# Patient Record
Sex: Female | Born: 1940 | Race: Black or African American | Hispanic: No | Smoking: Never smoker
Health system: Southern US, Community
[De-identification: ages and names within clinical notes are randomized; demographics above are authoritative.]

## PROBLEM LIST (undated history)

## (undated) DIAGNOSIS — I10 Essential (primary) hypertension: Secondary | ICD-10-CM

## (undated) DIAGNOSIS — K219 Gastro-esophageal reflux disease without esophagitis: Secondary | ICD-10-CM

## (undated) DIAGNOSIS — Z87442 Personal history of urinary calculi: Secondary | ICD-10-CM

## (undated) DIAGNOSIS — Z95810 Presence of automatic (implantable) cardiac defibrillator: Secondary | ICD-10-CM

## (undated) DIAGNOSIS — I428 Other cardiomyopathies: Secondary | ICD-10-CM

## (undated) DIAGNOSIS — E559 Vitamin D deficiency, unspecified: Secondary | ICD-10-CM

## (undated) DIAGNOSIS — N318 Other neuromuscular dysfunction of bladder: Secondary | ICD-10-CM

## (undated) DIAGNOSIS — J309 Allergic rhinitis, unspecified: Secondary | ICD-10-CM

## (undated) DIAGNOSIS — N959 Unspecified menopausal and perimenopausal disorder: Secondary | ICD-10-CM

## (undated) DIAGNOSIS — Z87448 Personal history of other diseases of urinary system: Secondary | ICD-10-CM

## (undated) DIAGNOSIS — D509 Iron deficiency anemia, unspecified: Secondary | ICD-10-CM

## (undated) DIAGNOSIS — M5137 Other intervertebral disc degeneration, lumbosacral region: Secondary | ICD-10-CM

## (undated) DIAGNOSIS — E785 Hyperlipidemia, unspecified: Secondary | ICD-10-CM

## (undated) DIAGNOSIS — D649 Anemia, unspecified: Secondary | ICD-10-CM

## (undated) DIAGNOSIS — F411 Generalized anxiety disorder: Secondary | ICD-10-CM

## (undated) DIAGNOSIS — I5022 Chronic systolic (congestive) heart failure: Secondary | ICD-10-CM

## (undated) DIAGNOSIS — Z872 Personal history of diseases of the skin and subcutaneous tissue: Secondary | ICD-10-CM

## (undated) DIAGNOSIS — M1710 Unilateral primary osteoarthritis, unspecified knee: Secondary | ICD-10-CM

## (undated) HISTORY — DX: Chronic systolic (congestive) heart failure: I50.22

## (undated) HISTORY — DX: Generalized anxiety disorder: F41.1

## (undated) HISTORY — DX: Essential (primary) hypertension: I10

## (undated) HISTORY — PX: LAMINECTOMY: SHX219

## (undated) HISTORY — DX: Other neuromuscular dysfunction of bladder: N31.8

## (undated) HISTORY — DX: Unspecified menopausal and perimenopausal disorder: N95.9

## (undated) HISTORY — DX: Anemia, unspecified: D64.9

## (undated) HISTORY — DX: Personal history of other diseases of urinary system: Z87.448

## (undated) HISTORY — DX: Other cardiomyopathies: I42.8

## (undated) HISTORY — DX: Vitamin D deficiency, unspecified: E55.9

## (undated) HISTORY — DX: Unilateral primary osteoarthritis, unspecified knee: M17.10

## (undated) HISTORY — DX: Other intervertebral disc degeneration, lumbosacral region: M51.37

## (undated) HISTORY — PX: ABDOMINAL HYSTERECTOMY: SHX81

## (undated) HISTORY — DX: Iron deficiency anemia, unspecified: D50.9

## (undated) HISTORY — DX: Gastro-esophageal reflux disease without esophagitis: K21.9

## (undated) HISTORY — DX: Personal history of diseases of the skin and subcutaneous tissue: Z87.2

## (undated) HISTORY — DX: Hyperlipidemia, unspecified: E78.5

## (undated) HISTORY — DX: Allergic rhinitis, unspecified: J30.9

---

## 2001-02-09 ENCOUNTER — Encounter: Payer: Self-pay | Admitting: Anesthesiology

## 2001-02-13 ENCOUNTER — Ambulatory Visit (HOSPITAL_COMMUNITY): Admission: RE | Admit: 2001-02-13 | Discharge: 2001-02-13 | Payer: Self-pay | Admitting: Urology

## 2002-06-24 ENCOUNTER — Encounter: Admission: RE | Admit: 2002-06-24 | Discharge: 2002-06-24 | Payer: Self-pay | Admitting: Internal Medicine

## 2002-06-24 ENCOUNTER — Encounter: Payer: Self-pay | Admitting: Internal Medicine

## 2003-02-13 ENCOUNTER — Ambulatory Visit (HOSPITAL_COMMUNITY): Admission: RE | Admit: 2003-02-13 | Discharge: 2003-02-13 | Payer: Self-pay | Admitting: Cardiology

## 2003-02-13 ENCOUNTER — Encounter: Payer: Self-pay | Admitting: Cardiology

## 2003-08-25 ENCOUNTER — Ambulatory Visit: Admission: RE | Admit: 2003-08-25 | Discharge: 2003-08-25 | Payer: Self-pay | Admitting: Pulmonary Disease

## 2003-08-28 ENCOUNTER — Encounter (HOSPITAL_COMMUNITY): Admission: RE | Admit: 2003-08-28 | Discharge: 2003-11-26 | Payer: Self-pay | Admitting: Cardiology

## 2003-11-19 ENCOUNTER — Ambulatory Visit (HOSPITAL_COMMUNITY): Admission: RE | Admit: 2003-11-19 | Discharge: 2003-11-19 | Payer: Self-pay | Admitting: Cardiology

## 2003-11-19 ENCOUNTER — Encounter: Payer: Self-pay | Admitting: Cardiology

## 2003-11-21 ENCOUNTER — Ambulatory Visit: Admission: RE | Admit: 2003-11-21 | Discharge: 2003-11-21 | Payer: Self-pay | Admitting: Cardiology

## 2003-12-10 ENCOUNTER — Encounter (HOSPITAL_COMMUNITY): Admission: RE | Admit: 2003-12-10 | Discharge: 2004-03-09 | Payer: Self-pay | Admitting: Cardiology

## 2004-05-17 ENCOUNTER — Ambulatory Visit: Payer: Self-pay | Admitting: Internal Medicine

## 2004-08-11 ENCOUNTER — Ambulatory Visit: Payer: Self-pay

## 2004-08-11 ENCOUNTER — Ambulatory Visit: Payer: Self-pay | Admitting: Critical Care Medicine

## 2004-08-19 ENCOUNTER — Ambulatory Visit: Admission: RE | Admit: 2004-08-19 | Discharge: 2004-08-19 | Payer: Self-pay | Admitting: Pulmonary Disease

## 2004-11-09 ENCOUNTER — Ambulatory Visit: Payer: Self-pay | Admitting: Internal Medicine

## 2004-12-01 ENCOUNTER — Ambulatory Visit: Payer: Self-pay

## 2005-02-08 ENCOUNTER — Ambulatory Visit: Payer: Self-pay | Admitting: Internal Medicine

## 2005-05-09 ENCOUNTER — Ambulatory Visit: Payer: Self-pay | Admitting: Internal Medicine

## 2005-05-20 ENCOUNTER — Ambulatory Visit: Payer: Self-pay | Admitting: Internal Medicine

## 2005-08-15 ENCOUNTER — Ambulatory Visit: Payer: Self-pay

## 2005-08-31 ENCOUNTER — Ambulatory Visit: Payer: Self-pay | Admitting: Cardiology

## 2005-09-06 ENCOUNTER — Ambulatory Visit: Admission: RE | Admit: 2005-09-06 | Discharge: 2005-09-06 | Payer: Self-pay | Admitting: Pulmonary Disease

## 2005-09-07 ENCOUNTER — Ambulatory Visit: Payer: Self-pay

## 2005-09-07 ENCOUNTER — Encounter: Payer: Self-pay | Admitting: Cardiology

## 2006-05-02 ENCOUNTER — Ambulatory Visit: Payer: Self-pay | Admitting: Cardiology

## 2006-07-19 ENCOUNTER — Ambulatory Visit: Payer: Self-pay | Admitting: Cardiology

## 2006-07-19 ENCOUNTER — Ambulatory Visit: Payer: Self-pay | Admitting: Internal Medicine

## 2006-07-20 ENCOUNTER — Encounter: Payer: Self-pay | Admitting: Internal Medicine

## 2006-07-20 LAB — CONVERTED CEMR LAB: Glucose, Bld: 96 mg/dL (ref 70–99)

## 2007-05-07 ENCOUNTER — Encounter: Payer: Self-pay | Admitting: *Deleted

## 2007-05-07 DIAGNOSIS — M51379 Other intervertebral disc degeneration, lumbosacral region without mention of lumbar back pain or lower extremity pain: Secondary | ICD-10-CM

## 2007-05-07 DIAGNOSIS — I10 Essential (primary) hypertension: Secondary | ICD-10-CM

## 2007-05-07 DIAGNOSIS — M5137 Other intervertebral disc degeneration, lumbosacral region: Secondary | ICD-10-CM

## 2007-05-07 DIAGNOSIS — Z9079 Acquired absence of other genital organ(s): Secondary | ICD-10-CM | POA: Insufficient documentation

## 2007-05-07 DIAGNOSIS — I471 Supraventricular tachycardia, unspecified: Secondary | ICD-10-CM | POA: Insufficient documentation

## 2007-05-07 DIAGNOSIS — Z9889 Other specified postprocedural states: Secondary | ICD-10-CM

## 2007-05-07 DIAGNOSIS — Z87448 Personal history of other diseases of urinary system: Secondary | ICD-10-CM | POA: Insufficient documentation

## 2007-05-07 DIAGNOSIS — K219 Gastro-esophageal reflux disease without esophagitis: Secondary | ICD-10-CM | POA: Insufficient documentation

## 2007-05-07 HISTORY — DX: Other intervertebral disc degeneration, lumbosacral region: M51.37

## 2007-05-07 HISTORY — DX: Personal history of other diseases of urinary system: Z87.448

## 2007-05-07 HISTORY — DX: Other intervertebral disc degeneration, lumbosacral region without mention of lumbar back pain or lower extremity pain: M51.379

## 2007-05-07 HISTORY — DX: Gastro-esophageal reflux disease without esophagitis: K21.9

## 2007-05-07 HISTORY — DX: Essential (primary) hypertension: I10

## 2007-05-10 ENCOUNTER — Ambulatory Visit: Payer: Self-pay | Admitting: Cardiology

## 2007-05-18 ENCOUNTER — Encounter: Payer: Self-pay | Admitting: Cardiology

## 2007-05-18 ENCOUNTER — Ambulatory Visit: Payer: Self-pay

## 2007-05-18 ENCOUNTER — Encounter: Payer: Self-pay | Admitting: Internal Medicine

## 2007-05-18 LAB — CONVERTED CEMR LAB
ALT: 14 units/L (ref 0–35)
AST: 15 units/L (ref 0–37)
Albumin: 3.6 g/dL (ref 3.5–5.2)
Alkaline Phosphatase: 82 units/L (ref 39–117)
BUN: 9 mg/dL (ref 6–23)
Bilirubin, Direct: 0.1 mg/dL (ref 0.0–0.3)
CO2: 30 meq/L (ref 19–32)
Calcium: 9.1 mg/dL (ref 8.4–10.5)
Chloride: 106 meq/L (ref 96–112)
Cholesterol: 194 mg/dL (ref 0–200)
Creatinine, Ser: 1 mg/dL (ref 0.4–1.2)
GFR calc Af Amer: 72 mL/min
GFR calc non Af Amer: 59 mL/min
Glucose, Bld: 92 mg/dL (ref 70–99)
HDL: 45.5 mg/dL (ref >39.0)
LDL Cholesterol: 124 mg/dL — ABNORMAL HIGH (ref 0–99)
Potassium: 3.3 meq/L — ABNORMAL LOW (ref 3.5–5.1)
Pro B Natriuretic peptide (BNP): 102 pg/mL — ABNORMAL HIGH (ref 0.0–100.0)
Sodium: 142 meq/L (ref 135–145)
Total Bilirubin: 0.4 mg/dL (ref 0.3–1.2)
Total CHOL/HDL Ratio: 4.3
Total Protein: 7 g/dL (ref 6.0–8.3)
Triglycerides: 122 mg/dL (ref 0–149)
VLDL: 24 mg/dL (ref 0–40)

## 2007-05-23 ENCOUNTER — Ambulatory Visit: Payer: Self-pay | Admitting: Internal Medicine

## 2007-05-23 DIAGNOSIS — D509 Iron deficiency anemia, unspecified: Secondary | ICD-10-CM

## 2007-05-23 DIAGNOSIS — E785 Hyperlipidemia, unspecified: Secondary | ICD-10-CM

## 2007-05-23 DIAGNOSIS — M1710 Unilateral primary osteoarthritis, unspecified knee: Secondary | ICD-10-CM

## 2007-05-23 DIAGNOSIS — IMO0002 Reserved for concepts with insufficient information to code with codable children: Secondary | ICD-10-CM

## 2007-05-23 HISTORY — DX: Reserved for concepts with insufficient information to code with codable children: IMO0002

## 2007-05-23 HISTORY — DX: Iron deficiency anemia, unspecified: D50.9

## 2007-05-23 HISTORY — DX: Hyperlipidemia, unspecified: E78.5

## 2007-05-24 ENCOUNTER — Encounter: Payer: Self-pay | Admitting: Internal Medicine

## 2007-05-29 ENCOUNTER — Encounter: Payer: Self-pay | Admitting: Internal Medicine

## 2007-06-04 ENCOUNTER — Telehealth: Payer: Self-pay | Admitting: Internal Medicine

## 2007-06-14 ENCOUNTER — Telehealth: Payer: Self-pay | Admitting: Internal Medicine

## 2007-06-18 ENCOUNTER — Ambulatory Visit: Payer: Self-pay | Admitting: Internal Medicine

## 2007-06-18 LAB — CONVERTED CEMR LAB
BUN: 10 mg/dL (ref 6–23)
Basophils Relative: 0.5 % (ref 0.0–1.0)
CO2: 30 meq/L (ref 19–32)
Calcium: 9.7 mg/dL (ref 8.4–10.5)
Creatinine, Ser: 1 mg/dL (ref 0.4–1.2)
Eosinophils Relative: 1.9 % (ref 0.0–5.0)
Glucose, Bld: 95 mg/dL (ref 70–99)
Hemoglobin: 12.3 g/dL (ref 12.0–15.0)
INR: 0.9 (ref 0.8–1.0)
Monocytes Absolute: 0.7 10*3/uL (ref 0.2–0.7)
Monocytes Relative: 9.5 % (ref 3.0–11.0)
Prothrombin Time: 11.4 s (ref 10.9–13.3)
RDW: 14.6 % (ref 11.5–14.6)
WBC: 7.6 10*3/uL (ref 4.5–10.5)

## 2007-06-25 ENCOUNTER — Observation Stay (HOSPITAL_COMMUNITY): Admission: RE | Admit: 2007-06-25 | Discharge: 2007-06-26 | Payer: Self-pay | Admitting: Internal Medicine

## 2007-06-25 ENCOUNTER — Ambulatory Visit: Payer: Self-pay | Admitting: Internal Medicine

## 2007-07-11 ENCOUNTER — Ambulatory Visit: Payer: Self-pay

## 2007-07-11 LAB — CONVERTED CEMR LAB
BUN: 12 mg/dL (ref 6–23)
CO2: 30 meq/L (ref 19–32)
Calcium: 9.7 mg/dL (ref 8.4–10.5)
Creatinine, Ser: 1 mg/dL (ref 0.4–1.2)

## 2007-07-16 ENCOUNTER — Ambulatory Visit: Payer: Self-pay | Admitting: Pulmonary Disease

## 2007-08-08 ENCOUNTER — Ambulatory Visit: Payer: Self-pay | Admitting: Cardiology

## 2007-08-17 ENCOUNTER — Ambulatory Visit: Payer: Self-pay | Admitting: Cardiology

## 2007-08-17 LAB — CONVERTED CEMR LAB
CO2: 29 meq/L (ref 19–32)
Calcium: 9.7 mg/dL (ref 8.4–10.5)
Chloride: 103 meq/L (ref 96–112)
Creatinine, Ser: 1.1 mg/dL (ref 0.4–1.2)
Glucose, Bld: 97 mg/dL (ref 70–99)

## 2007-09-25 ENCOUNTER — Ambulatory Visit: Payer: Self-pay | Admitting: Internal Medicine

## 2007-11-06 ENCOUNTER — Ambulatory Visit: Payer: Self-pay | Admitting: Internal Medicine

## 2007-11-06 DIAGNOSIS — J309 Allergic rhinitis, unspecified: Secondary | ICD-10-CM

## 2007-11-06 HISTORY — DX: Allergic rhinitis, unspecified: J30.9

## 2007-11-08 ENCOUNTER — Telehealth: Payer: Self-pay | Admitting: Internal Medicine

## 2007-12-05 ENCOUNTER — Ambulatory Visit: Payer: Self-pay | Admitting: Internal Medicine

## 2008-01-09 ENCOUNTER — Ambulatory Visit: Payer: Self-pay

## 2008-01-09 ENCOUNTER — Ambulatory Visit: Payer: Self-pay | Admitting: Cardiology

## 2008-01-09 LAB — CONVERTED CEMR LAB
BUN: 15 mg/dL (ref 6–23)
CO2: 28 meq/L (ref 19–32)
Chloride: 104 meq/L (ref 96–112)
Creatinine, Ser: 1.3 mg/dL — ABNORMAL HIGH (ref 0.4–1.2)
Glucose, Bld: 97 mg/dL (ref 70–99)

## 2008-02-22 ENCOUNTER — Ambulatory Visit: Payer: Self-pay | Admitting: Internal Medicine

## 2008-02-22 DIAGNOSIS — F411 Generalized anxiety disorder: Secondary | ICD-10-CM | POA: Insufficient documentation

## 2008-02-22 DIAGNOSIS — R319 Hematuria, unspecified: Secondary | ICD-10-CM

## 2008-02-22 DIAGNOSIS — R079 Chest pain, unspecified: Secondary | ICD-10-CM

## 2008-02-22 HISTORY — DX: Generalized anxiety disorder: F41.1

## 2008-02-22 LAB — CONVERTED CEMR LAB
Bilirubin Urine: NEGATIVE
Ketones, ur: NEGATIVE mg/dL
Mucus, UA: NEGATIVE
Nitrite: NEGATIVE
Total Protein, Urine: NEGATIVE mg/dL
pH: 8 (ref 5.0–8.0)

## 2008-05-06 ENCOUNTER — Ambulatory Visit: Payer: Self-pay | Admitting: Internal Medicine

## 2008-05-06 DIAGNOSIS — N959 Unspecified menopausal and perimenopausal disorder: Secondary | ICD-10-CM

## 2008-05-06 HISTORY — DX: Unspecified menopausal and perimenopausal disorder: N95.9

## 2008-05-07 ENCOUNTER — Ambulatory Visit: Payer: Self-pay

## 2008-05-09 LAB — CONVERTED CEMR LAB
ALT: 19 units/L (ref 0–35)
Albumin: 3.6 g/dL (ref 3.5–5.2)
BUN: 14 mg/dL (ref 6–23)
Bilirubin Urine: NEGATIVE
CO2: 29 meq/L (ref 19–32)
Calcium: 9 mg/dL (ref 8.4–10.5)
Creatinine, Ser: 1.1 mg/dL (ref 0.4–1.2)
Crystals: NEGATIVE
Eosinophils Absolute: 0.2 10*3/uL (ref 0.0–0.7)
Folate: 11.6 ng/mL
GFR calc non Af Amer: 53 mL/min
HCT: 33.7 % — ABNORMAL LOW (ref 36.0–46.0)
HDL: 46.8 mg/dL (ref >39.0)
Iron: 74 ug/dL (ref 42–145)
Ketones, ur: NEGATIVE mg/dL
MCHC: 32.8 g/dL (ref 30.0–36.0)
MCV: 81 fL (ref 78.0–100.0)
Monocytes Absolute: 0.4 10*3/uL (ref 0.1–1.0)
Platelets: 253 10*3/uL (ref 150–400)
RDW: 13.7 % (ref 11.5–14.6)
TSH: 0.58 microintl units/mL (ref 0.35–5.50)
Total Bilirubin: 0.6 mg/dL (ref 0.3–1.2)
Total Protein, Urine: NEGATIVE mg/dL
Transferrin: 208 mg/dL — ABNORMAL LOW (ref >=212.0)
Triglycerides: 79 mg/dL (ref 0–149)
Urine Glucose: NEGATIVE mg/dL
Vitamin B-12: 316 pg/mL (ref 211–911)

## 2008-05-16 ENCOUNTER — Telehealth (INDEPENDENT_AMBULATORY_CARE_PROVIDER_SITE_OTHER): Payer: Self-pay | Admitting: *Deleted

## 2008-05-28 ENCOUNTER — Telehealth: Payer: Self-pay | Admitting: Internal Medicine

## 2008-06-17 ENCOUNTER — Ambulatory Visit: Payer: Self-pay | Admitting: Internal Medicine

## 2008-06-17 DIAGNOSIS — D649 Anemia, unspecified: Secondary | ICD-10-CM

## 2008-06-17 DIAGNOSIS — L03119 Cellulitis of unspecified part of limb: Secondary | ICD-10-CM

## 2008-06-17 DIAGNOSIS — E559 Vitamin D deficiency, unspecified: Secondary | ICD-10-CM | POA: Insufficient documentation

## 2008-06-17 DIAGNOSIS — L02419 Cutaneous abscess of limb, unspecified: Secondary | ICD-10-CM

## 2008-06-17 HISTORY — DX: Anemia, unspecified: D64.9

## 2008-06-17 HISTORY — DX: Vitamin D deficiency, unspecified: E55.9

## 2008-09-03 ENCOUNTER — Encounter: Payer: Self-pay | Admitting: Internal Medicine

## 2008-09-22 ENCOUNTER — Telehealth (INDEPENDENT_AMBULATORY_CARE_PROVIDER_SITE_OTHER): Payer: Self-pay | Admitting: *Deleted

## 2008-10-07 ENCOUNTER — Telehealth (INDEPENDENT_AMBULATORY_CARE_PROVIDER_SITE_OTHER): Payer: Self-pay | Admitting: *Deleted

## 2008-11-24 ENCOUNTER — Encounter (INDEPENDENT_AMBULATORY_CARE_PROVIDER_SITE_OTHER): Payer: Self-pay | Admitting: *Deleted

## 2008-12-28 DIAGNOSIS — I5022 Chronic systolic (congestive) heart failure: Secondary | ICD-10-CM

## 2008-12-28 HISTORY — DX: Chronic systolic (congestive) heart failure: I50.22

## 2008-12-30 ENCOUNTER — Ambulatory Visit: Payer: Self-pay | Admitting: Internal Medicine

## 2008-12-30 DIAGNOSIS — I428 Other cardiomyopathies: Secondary | ICD-10-CM

## 2008-12-30 HISTORY — DX: Other cardiomyopathies: I42.8

## 2009-02-02 ENCOUNTER — Ambulatory Visit: Payer: Self-pay | Admitting: Internal Medicine

## 2009-02-02 DIAGNOSIS — H9209 Otalgia, unspecified ear: Secondary | ICD-10-CM | POA: Insufficient documentation

## 2009-02-02 DIAGNOSIS — Z872 Personal history of diseases of the skin and subcutaneous tissue: Secondary | ICD-10-CM

## 2009-02-02 HISTORY — DX: Personal history of diseases of the skin and subcutaneous tissue: Z87.2

## 2009-02-02 LAB — CONVERTED CEMR LAB
ALT: 15 units/L (ref 0–35)
BUN: 15 mg/dL (ref 6–23)
Basophils Absolute: 0 10*3/uL (ref 0.0–0.1)
Basophils Relative: 0.3 % (ref 0.0–3.0)
Bilirubin, Direct: 0.1 mg/dL (ref 0.0–0.3)
CO2: 30 meq/L (ref 19–32)
Calcium: 8.9 mg/dL (ref 8.4–10.5)
Chloride: 106 meq/L (ref 96–112)
Cholesterol: 198 mg/dL (ref 0–200)
Creatinine, Ser: 1 mg/dL (ref 0.4–1.2)
Eosinophils Absolute: 0.2 10*3/uL (ref 0.0–0.7)
HDL: 47.5 mg/dL (ref >39.00)
Ketones, ur: NEGATIVE mg/dL
LDL Cholesterol: 132 mg/dL — ABNORMAL HIGH (ref 0–99)
Lymphocytes Relative: 59.4 % — ABNORMAL HIGH (ref 12.0–46.0)
MCHC: 31.5 g/dL (ref 30.0–36.0)
MCV: 82.1 fL (ref 78.0–100.0)
Monocytes Absolute: 0.4 10*3/uL (ref 0.1–1.0)
Neutrophils Relative %: 28.8 % — ABNORMAL LOW (ref 43.0–77.0)
Platelets: 210 10*3/uL (ref 150.0–400.0)
RBC: 4.2 M/uL (ref 3.87–5.11)
RDW: 13.9 % (ref 11.5–14.6)
Total Bilirubin: 0.7 mg/dL (ref 0.3–1.2)
Total CHOL/HDL Ratio: 4
Total Protein, Urine: NEGATIVE mg/dL
Triglycerides: 91 mg/dL (ref 0.0–149.0)
Urine Glucose: NEGATIVE mg/dL
Urobilinogen, UA: 0.2 (ref 0.0–1.0)

## 2009-02-03 LAB — CONVERTED CEMR LAB: Vit D, 25-Hydroxy: 32 ng/mL (ref 30–89)

## 2009-02-08 LAB — HM MAMMOGRAPHY: HM Mammogram: NORMAL

## 2009-04-10 LAB — CONVERTED CEMR LAB: Pap Smear: NORMAL

## 2009-05-19 ENCOUNTER — Encounter (INDEPENDENT_AMBULATORY_CARE_PROVIDER_SITE_OTHER): Payer: Self-pay | Admitting: *Deleted

## 2009-06-12 ENCOUNTER — Encounter (INDEPENDENT_AMBULATORY_CARE_PROVIDER_SITE_OTHER): Payer: Self-pay

## 2009-06-15 ENCOUNTER — Encounter (INDEPENDENT_AMBULATORY_CARE_PROVIDER_SITE_OTHER): Payer: Self-pay | Admitting: *Deleted

## 2009-06-30 ENCOUNTER — Telehealth: Payer: Self-pay | Admitting: Internal Medicine

## 2009-08-27 ENCOUNTER — Telehealth: Payer: Self-pay | Admitting: Internal Medicine

## 2009-09-29 ENCOUNTER — Ambulatory Visit: Payer: Self-pay | Admitting: Internal Medicine

## 2009-09-29 DIAGNOSIS — H1045 Other chronic allergic conjunctivitis: Secondary | ICD-10-CM | POA: Insufficient documentation

## 2009-10-12 ENCOUNTER — Telehealth: Payer: Self-pay | Admitting: Internal Medicine

## 2009-11-24 ENCOUNTER — Ambulatory Visit: Payer: Self-pay | Admitting: Internal Medicine

## 2010-01-12 ENCOUNTER — Telehealth: Payer: Self-pay | Admitting: Internal Medicine

## 2010-01-25 ENCOUNTER — Ambulatory Visit: Payer: Self-pay | Admitting: Internal Medicine

## 2010-01-26 LAB — CONVERTED CEMR LAB
ALT: 10 units/L (ref 0–35)
AST: 13 units/L (ref 0–37)
BUN: 10 mg/dL (ref 6–23)
Basophils Relative: 0.5 % (ref 0.0–3.0)
CO2: 27 meq/L (ref 19–32)
Chloride: 111 meq/L (ref 96–112)
Eosinophils Absolute: 0.2 10*3/uL (ref 0.0–0.7)
Eosinophils Relative: 2.7 % (ref 0.0–5.0)
Glucose, Bld: 88 mg/dL (ref 70–99)
Hemoglobin: 11.1 g/dL — ABNORMAL LOW (ref 12.0–15.0)
Lymphocytes Relative: 44.1 % (ref 12.0–46.0)
Monocytes Relative: 6.9 % (ref 3.0–12.0)
Neutro Abs: 3 10*3/uL (ref 1.4–7.7)
Neutrophils Relative %: 45.8 % (ref 43.0–77.0)
Potassium: 4.9 meq/L (ref 3.5–5.1)
RBC: 4.05 M/uL (ref 3.87–5.11)
Saturation Ratios: 33.3 % (ref 20.0–50.0)
Specific Gravity, Urine: 1.02 (ref 1.000–1.030)
TSH: 0.46 microintl units/mL (ref 0.35–5.50)
Total Bilirubin: 0.5 mg/dL (ref 0.3–1.2)
Total Protein: 6.6 g/dL (ref 6.0–8.3)
Urine Glucose: NEGATIVE mg/dL
Urobilinogen, UA: 0.2 (ref 0.0–1.0)
VLDL: 20.2 mg/dL (ref 0.0–40.0)
Vitamin B-12: 245 pg/mL (ref 211–911)
WBC: 6.6 10*3/uL (ref 4.5–10.5)
pH: 7.5 (ref 5.0–8.0)

## 2010-01-27 ENCOUNTER — Ambulatory Visit: Payer: Self-pay | Admitting: Internal Medicine

## 2010-01-27 DIAGNOSIS — IMO0002 Reserved for concepts with insufficient information to code with codable children: Secondary | ICD-10-CM

## 2010-02-02 ENCOUNTER — Telehealth: Payer: Self-pay | Admitting: Internal Medicine

## 2010-04-19 ENCOUNTER — Telehealth: Payer: Self-pay | Admitting: Internal Medicine

## 2010-04-23 ENCOUNTER — Encounter: Payer: Self-pay | Admitting: Internal Medicine

## 2010-06-22 ENCOUNTER — Ambulatory Visit: Payer: Self-pay | Admitting: Cardiology

## 2010-07-02 ENCOUNTER — Encounter (INDEPENDENT_AMBULATORY_CARE_PROVIDER_SITE_OTHER): Payer: Self-pay | Admitting: *Deleted

## 2010-07-13 ENCOUNTER — Telehealth: Payer: Self-pay | Admitting: Internal Medicine

## 2010-08-01 ENCOUNTER — Encounter: Payer: Self-pay | Admitting: Family Medicine

## 2010-08-03 ENCOUNTER — Ambulatory Visit
Admission: RE | Admit: 2010-08-03 | Discharge: 2010-08-03 | Payer: Self-pay | Source: Home / Self Care | Attending: Internal Medicine | Admitting: Internal Medicine

## 2010-08-03 DIAGNOSIS — N318 Other neuromuscular dysfunction of bladder: Secondary | ICD-10-CM | POA: Insufficient documentation

## 2010-08-03 DIAGNOSIS — H6990 Unspecified Eustachian tube disorder, unspecified ear: Secondary | ICD-10-CM | POA: Insufficient documentation

## 2010-08-03 DIAGNOSIS — J019 Acute sinusitis, unspecified: Secondary | ICD-10-CM | POA: Insufficient documentation

## 2010-08-03 HISTORY — DX: Other neuromuscular dysfunction of bladder: N31.8

## 2010-08-08 LAB — CONVERTED CEMR LAB
Basophils Relative: 0.5 % (ref 0.0–1.0)
Eosinophils Absolute: 0.2 10*3/uL (ref 0.0–0.6)
Eosinophils Relative: 3 % (ref 0.0–5.0)
HCT: 39 % (ref 36.0–46.0)
Ketones, ur: NEGATIVE mg/dL
MCV: 80.4 fL (ref 78.0–100.0)
Nitrite: NEGATIVE
Pap Smear: NORMAL
Platelets: 255 10*3/uL (ref 150–400)
RBC: 4.85 M/uL (ref 3.87–5.11)
RDW: 14.3 % (ref 11.5–14.6)
Specific Gravity, Urine: 1.015 (ref 1.000–1.03)
Total Protein, Urine: NEGATIVE mg/dL
Urine Glucose: NEGATIVE mg/dL
Urobilinogen, UA: 0.2 (ref 0.0–1.0)
WBC: 6.1 10*3/uL (ref 4.5–10.5)
pH: 7.5 (ref 5.0–8.0)

## 2010-08-12 NOTE — Progress Notes (Signed)
Summary: Medication refill  Phone Note Refill Request Message from:  Fax from Pharmacy on April 19, 2010 9:11 AM  Refills Requested: Medication #1:  VICODIN 5-500 MG  TABS 1 by mouth qid as needed pain   Dosage confirmed as above?Dosage Confirmed   Last Refilled: 01/12/2010   [facility] [address] [place], [place], [place] Initial call taken by: Shirlean Mylar Ewing CMA Deborra Medina),  April 19, 2010 9:12 AM  Follow-up for Phone Call        faxed hardcopy to pharmacy Follow-up by: Robin Ewing CMA Deborra Medina),  April 19, 2010 1:42 PM    Prescriptions: VICODIN 5-500 MG  TABS (HYDROCODONE-ACETAMINOPHEN) 1 by mouth qid as needed pain  #120 x 2   Entered and Authorized by:   Biagio Borg MD   Signed by:   Biagio Borg MD on 04/19/2010   Method used:   Print then Give to Patient   [id number]  done hardcopy to LIM side B - dahlia Biagio Borg MD  April 19, 2010 12:52 PM

## 2010-08-12 NOTE — Progress Notes (Signed)
Summary: medication refill  Phone Note Refill Request Message from:  Fax from Pharmacy on July 13, 2010 8:49 AM  Refills Requested: Medication #1:  VICODIN 5-500 MG  TABS 1 by mouth qid as needed pain   Dosage confirmed as above?Dosage Confirmed   Last Refilled: 04/19/2010   Notes: [facility] [place] [phone] Initial call taken by: Robin Ewing CMA ([place]),  July 13, 2010 8:49 AM    New/Updated Medications: VICODIN 5-500 MG  TABS (HYDROCODONE-ACETAMINOPHEN) 1 by mouth qid as needed pain Prescriptions: VICODIN 5-500 MG  TABS (HYDROCODONE-ACETAMINOPHEN) 1 by mouth qid as needed pain  #120 x 2   Entered and Authorized by:   Biagio Borg MD   Signed by:   Biagio Borg MD on 07/13/2010   Method used:   Print then Give to Patient   RxID:   [id number]  done hardcopy to LIM side B - dahlia Biagio Borg MD  July 13, 2010 8:54 AM   Rx faxed to pharmacy Crissie Sickles, [place]  July 13, 2010 9:04 AM

## 2010-08-12 NOTE — Letter (Signed)
Summary: Referral - not able to see patient  [facility]  [place], [place] [address]   Phone: [phone]  Fax: [phone]    April 23, 2010    Biagio Borg, M.D. [address], [place] [address]    Re:   [name] [name] DOB:  1941-01-06 MRN:   [id number]    Dear Dr. Jenny Reichmann:  Thank you for your kind referral of the above patient.  We have attempted to schedule the recommended procedure Screening Colonoscopy but have not been able to schedule because:  ___ The patient was not available by phone and/or has not returned our calls.   X  The patient declined to schedule the procedure at this time.  We appreciate the referral and hope that we will have the opportunity to treat this patient in the future.    Sincerely,    [facility] [phone]

## 2010-08-12 NOTE — Letter (Signed)
Summary: Device-Delinquent Check  [place], [place] [address] [place]   [place], [place] [address]   Phone: [phone]  Fax: [phone]     July 02, 2010 MRN: [id number]   [facility] [address] [place], [place]  [address]   Dear [name] [name],  According to our records, you have not had your implanted device checked in the recommended period of time.  We are unable to determine appropriate device function without checking your device on a regular basis.  Please call our office to schedule an appointment, with Dr Caryl Comes, as soon as possible.  If you are having your device checked by another physician, please call us so that we may update our records.  Thank you,  Gasper Sells, EMT  July 02, 2010 12:52 PM  [facility]

## 2010-08-12 NOTE — Progress Notes (Signed)
Summary: Med Refill  Phone Note Refill Request  on October 12, 2009 8:35 AM  Refills Requested: Medication #1:  VICODIN 5-500 MG  TABS 1 by mouth qid as needed pain   Dosage confirmed as above?Dosage Confirmed   Last Refilled: 06/2009   Notes[facility] [address] [place] Initial call taken by: Sharon Seller,  October 12, 2009 8:35 AM  Follow-up for Phone Call        rx faxed to pharmacy Follow-up by: Crissie Sickles, [place],  October 12, 2009 10:04 AM    New/Updated Medications: VICODIN 5-500 MG  TABS (HYDROCODONE-ACETAMINOPHEN) 1 by mouth qid as needed pain Prescriptions: VICODIN 5-500 MG  TABS (HYDROCODONE-ACETAMINOPHEN) 1 by mouth qid as needed pain  #120 x 2   Entered and Authorized by:   Biagio Borg MD   Signed by:   Biagio Borg MD on 10/12/2009   Method used:   Print then Give to Patient   RxID:   [id number]  done hardcopy to LIM side B - dahlia  Biagio Borg MD  October 12, 2009 9:03 AM

## 2010-08-12 NOTE — Miscellaneous (Signed)
Summary: [facility]   Imported By: Bubba Hales 10/09/2009 08:55:25  _____________________________________________________________________  External Attachment:    Type:   Image     Comment:   External Document

## 2010-08-12 NOTE — Assessment & Plan Note (Signed)
Summary: allergies/eyes running water/irriated left eye/lb   Vital Signs:  Patient profile:   70 year old female Height:      67 inches Weight:      200.13 pounds BMI:     31.46 O2 Sat:      98 % on Room air Temp:     97 degrees F oral Pulse rate:   72 / minute BP sitting:   120 / 68  (left arm) Cuff size:   regular  Vitals Entered ByShirlean Mylar Ewing (September 29, 2009 10:10 AM)  O2 Flow:  Room air  CC: eye red, left ear pain, allergies/RE   CC:  eye red, left ear pain, and allergies/RE.  History of Present Illness: nexium too expensive and has not been able to take iwth freq heartburn and sour brash, no dyshphgia, n/v, wt loss, abd pian , change in bowel habits or blood.     also here with marked, severe allergy symptoms, started after working in the yard, had 2 dogwood tress in the forn  yard;  symptoms severe to the eyes with itch and matting and swelling and burning but no pain per se and no fever;  saw optometrist who gave her some kind of eye drops that she is not sure worked;  also with nasall allergy type symtpoms with itching, sneezing and d/c with clearish yellow d/c.  No fever, St, cough  Pt denies CP, sob, doe, wheezing, orthopnea, pnd, worsening LE edema, palps, dizziness or syncope  Pt denies new neuro symptoms such as headache, facial or extremity weakness   also mentions left ear seems to recur again with the pain and fullness, feels like "it is leaking and running" but no d/c and nothing on the qtip ;  no hearing loss, or  headache but has had some dizziness that attributes to flu like symtpoms last wk that have o/w resolved.    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) 8)   Vitamin D Deficiency  (ICD-268.9) 9)  Cellulitis, Leg, Left  (ICD-682.6) 10)  Anemia-nos  (ICD-285.9) 11)  Preventive Health Care  (ICD-V70.0) 12)  Menopausal Disorder  (ICD-627.9) 13)  Anxiety  (ICD-300.00) 14)  Chest Pain  (ICD-786.50) 15)  Hematuria Unspecified  (ICD-599.70) 16)  Allergic Rhinitis  (ICD-477.9) 17)  Osteoarthritis, Knees, Bilateral  (ICD-715.96) 18)  Preventive Health Care  (ICD-V70.0) 19)  Family History of Cad Female 1st Degree Relative <60  (ICD-V16.49) 20)  Hyperlipidemia  (ICD-272.4) 21)  Anemia-iron Deficiency  (ICD-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 Take One Tablet Twice Daily 2)  Furosemide 40 Mg  Tabs (Furosemide) .... 1/2 - 1 By Mouth Once Daily 3)  Vicodin 5-500 Mg  Tabs (Hydrocodone-Acetaminophen) .[name].. 1 By Mouth Qid As Needed Pain 4)  Nexium 40 Mg  Cpdr (Esomeprazole Magnesium) .[name].. 1 By Mouth Once Daily 5)  Benazepril Hcl 40 Mg  Tabs (Benazepril Hcl) .[name].. 1 By Mouth Once Daily 6)  Diclofenac Sodium 75 Mg  Tbec (Diclofenac Sodium) .[name].. 1 By Mouth Two Times A Day Prn 7)  Spironolactone 25 Mg  Tabs (Spironolactone) .... 1/2 By Mouth Qd 8)  Advair Diskus 250-50 Mcg/dose  Misc (Fluticasone-Salmeterol) .[name].. 1 Puff Two Times A Day 9)  Estradiol 1 Mg Tabs (Estradiol) .[name].. 1po Once Daily 10)  Cetirizine Hcl 10 Mg Tabs (Cetirizine Hcl) .[name].. 1 By Mouth Once Daily 11)  Vitamin D3 1000 Unit Caps (Cholecalciferol) .[name].. 1 By Mouth Once Daily 12)  Benadryl 25 Mg Tabs (Diphenhydramine Hcl) .... As Needed 13)  Promethazine Hcl 25 Mg Tabs (Promethazine Hcl) .[name].. 1 By Mouth Q 6 Hrs As Needed Nausea  Current Medications (verified): 1)   Coreg 25 Mg  Tabs (Carvedilol) .... Take One Tablet Twice Daily 2)  Furosemide 40 Mg  Tabs (Furosemide) .... 1/2 - 1 By Mouth Once Daily 3)  Vicodin 5-500 Mg  Tabs (Hydrocodone-Acetaminophen) .[name].. 1 By Mouth Qid As Needed Pain 4)  Omeprazole 20 Mg Cpdr (Omeprazole) .... 2 By Mouth Once Daily 5)  Benazepril Hcl 40 Mg  Tabs (Benazepril Hcl) .[name].. 1 By Mouth Once Daily 6)  Diclofenac Sodium 75 Mg  Tbec (Diclofenac Sodium) .[name].. 1 By Mouth Two Times A Day Prn 7)  Spironolactone 25 Mg  Tabs (Spironolactone) .... 1/2 By Mouth Qd 8)  Advair Diskus 250-50 Mcg/dose  Misc (Fluticasone-Salmeterol) .[name].. 1 Puff Two Times A Day 9)  Estradiol 1 Mg Tabs (Estradiol) .[name].. 1po Once Daily 10)  Cetirizine Hcl 10 Mg Tabs (Cetirizine Hcl) .[name].. 1 By Mouth Once Daily 11)  Vitamin D3 1000 Unit Caps (Cholecalciferol) .[name].. 1 By Mouth Once Daily 12)  Benadryl 25 Mg Tabs (Diphenhydramine Hcl) .... As Needed 13)  Promethazine Hcl 25 Mg Tabs (Promethazine Hcl) .[name].. 1 By Mouth Q 6 Hrs As Needed Nausea 14)  Nasonex 50 Mcg/act Susp (Mometasone Furoate) .... 2 Spray /side Once Daily 15)  Prednisone 10 Mg Tabs (Prednisone) .... 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   Head:  normocephalic and atraumatic.   Eyes:  bilat conjunct with erythema, clearish drainage Ears:  right tm mld erythema only, left tm with mod erythema and mild fluid post with slight bulging Nose:  nasal dischargemucosal pallor and mucosal edema.   Mouth:  pharyngeal erythema and fair dentition.   Neck:  supple and no masses.   Lungs:  normal respiratory effort and normal breath sounds.   Heart:  normal rate and regular rhythm.   Extremities:  no edema, no  erythema    Impression & Recommendations:  Problem # 1:  ALLERGIC RHINITIS (ICD-477.9)  Her updated medication list for this problem includes:    Cetirizine Hcl 10 Mg Tabs (Cetirizine hcl) .[name]... 1 by mouth once daily    Benadryl 25 Mg Tabs (Diphenhydramine hcl) .[name]... As needed    Promethazine Hcl 25 Mg Tabs (Promethazine hcl) .[name]... 1 by mouth q 6 hrs as needed nausea    Nasonex 50 Mcg/act Susp (Mometasone furoate) .[name]... 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 2 by mouth once daily gave nexium samples, but also to change to above  Complete Medication List: 1)  Coreg 25 Mg Tabs (Carvedilol) .... Take one tablet twice daily 2)  Furosemide 40 Mg Tabs (Furosemide) .... 1/2 - 1 by mouth once daily 3)  Vicodin 5-500 Mg Tabs  (Hydrocodone-acetaminophen) .[name].. 1 by mouth qid as needed pain 4)  Omeprazole 20 Mg Cpdr (Omeprazole) .... 2 by mouth once daily 5)  Benazepril Hcl 40 Mg Tabs (Benazepril hcl) .[name].. 1 by mouth once daily 6)  Diclofenac Sodium 75 Mg Tbec (Diclofenac sodium) .[name].. 1 by mouth two times a day prn 7)  Spironolactone 25 Mg Tabs (Spironolactone) .... 1/2 by mouth qd 8)  Advair Diskus 250-50 Mcg/dose Misc (Fluticasone-salmeterol) .[name].. 1 puff two times a day 9)  Estradiol 1 Mg Tabs (Estradiol) .[name].. 1po once daily 10)  Cetirizine Hcl 10 Mg Tabs (Cetirizine hcl) .[name].. 1 by mouth once daily 11)  Vitamin D3 1000 Unit Caps (Cholecalciferol) .[name].. 1 by mouth once daily 12)  Benadryl 25 Mg Tabs (Diphenhydramine hcl) .... As needed 13)  Promethazine Hcl 25 Mg Tabs (Promethazine hcl) .[name].. 1 by mouth q 6 hrs as needed nausea 14)  Nasonex 50 Mcg/act Susp (Mometasone furoate) .... 2 spray /side once daily 15)  Prednisone 10 Mg Tabs (Prednisone) .... 4po qd for 3days, then 3po qd for 3days, then 2po qd for 3days, then 1po qd for 3 days, then stop  Patient Instructions: 1)  Please take all new medications as prescribed  - the prednisone, and the nasonex (please call if the card for the nasonex does not work, since the nasonex can be changed to generic flonase) 2)  Continue all previous medications as before this visit  3)  You can also use Mucinex OTC or it's generic for congestion 4)  stop the nexium when the pills are gone 5)  start the omeprazole 20 mg after that  - 2 per day 6)  Please schedule a follow-up appointment in 4 months with CPX labs Prescriptions: PREDNISONE 10 MG TABS (PREDNISONE) 4po qd for 3days, then 3po qd for 3days, then 2po qd for 3days, then 1po qd for 3 days, then stop  #30 x 0   Entered and Authorized by:   Biagio Borg MD   Signed by:   Biagio Borg MD on 09/29/2009   Method used:   Print then Give to Patient   RxID:   [id number] NASONEX 24  MCG/ACT SUSP (MOMETASONE FUROATE) 2 spray  /side once daily  #1 x 11   Entered and Authorized by:   Biagio Borg MD   Signed by:   Biagio Borg MD on 09/29/2009   Method used:   Print then Give to Patient   RxID:   [id number] OMEPRAZOLE 20 MG CPDR (OMEPRAZOLE) 2 by mouth once daily  #60 x 11   Entered and Authorized by:   Biagio Borg MD   Signed by:   Biagio Borg MD on 09/29/2009   Method used:   Print then Give to Patient   RxID:   [id number]

## 2010-08-12 NOTE — Progress Notes (Signed)
Summary: sick call  Phone Note Call from Patient   Summary of Call: Patient left message on triage c/o fever, body aches, and cough. I rtn patient call and per the patient all she has taken is Benadryl. I advised patient that she could try OTC cold medications, mucinex, and robitussin or delsym. Patient asked about prescription for nausea and I made her aware that MD usually does not give that prescription without office visit. I made pt aware that if she feels like she needs to see a MD now, she should go to UC or ER. Patient stated that she would call back tomorrow if she doesn't feel any better. Any recommendations? Initial call taken by: Ernestene Mention,  August 27, 2009 4:37 PM  Follow-up for Phone Call        ok for phenergan as needed - done escript Follow-up by: Biagio Borg MD,  August 27, 2009 5:15 PM  Additional Follow-up for Phone Call Additional follow up Details #1::        Patient notified. Additional Follow-up by: Ernestene Mention,  August 28, 2009 10:01 AM    New/Updated Medications: PROMETHAZINE HCL 25 MG TABS (PROMETHAZINE HCL) 1 by mouth q 6 hrs as needed nausea Prescriptions: PROMETHAZINE HCL 25 MG TABS (PROMETHAZINE HCL) 1 by mouth q 6 hrs as needed nausea  #40 x 1   Entered and Authorized by:   Biagio Borg MD   Signed by:   Biagio Borg MD on 08/27/2009   Method used:   Electronically to        [facility] (retail)       [address], [place]       [place],   [address]       Ph: [phone]       Fax: [phone]   RxID:   [id number]

## 2010-08-12 NOTE — Progress Notes (Signed)
Summary: medication Refill  Phone Note Refill Request  on January 12, 2010 8:58 AM  Refills Requested: Medication #1:  VICODIN 5-500 MG  TABS 1 by mouth qid as needed pain   Dosage confirmed as above?Dosage Confirmed   Last Refilled: 10/12/2009   Notes: [facility] Initial call taken by: Robin Ewing CMA ([place]),  January 12, 2010 8:59 AM    Prescriptions: VICODIN 5-500 MG  TABS (HYDROCODONE-ACETAMINOPHEN) 1 by mouth qid as needed pain  #120 x 2   Entered and Authorized by:   Biagio Borg MD   Signed by:   Biagio Borg MD on 01/12/2010   Method used:   Print then Give to Patient   RxID:   [id number]  done hardcopy to LIM side B - dahlia  Biagio Borg MD  January 12, 2010 1:02 PM   Rx faxed to [facility], [place]  January 12, 2010 1:17 PM

## 2010-08-12 NOTE — Progress Notes (Signed)
Summary: Nasonex rx  Phone Note From Pharmacy   Caller: [place]. Summary of Call: [place]. [phone]) wants to see if the Nasonex rx given last week can be changed to Flonase as the pt's insurance doesn't cover Nasonex. Initial call taken by: Rebeca Alert MA,  February 02, 2010 8:08 AM  Follow-up for Phone Call        ok to  change - to robin to handle Follow-up by: Biagio Borg MD,  February 02, 2010 9:34 AM    New/Updated Medications: FLONASE 50 MCG/ACT SUSP (FLUTICASONE PROPIONATE) 2 spray/side once daily Prescriptions: FLONASE 50 MCG/ACT SUSP (FLUTICASONE PROPIONATE) 2 spray/side once daily  #3 x 3   Entered by:   Sharon Seller CMA ([place])   Authorized by:   Biagio Borg MD   Signed by:   Sharon Seller CMA ([place]) on 02/02/2010   Method used:   Electronically to        [id number] (retail)       [address] [place], [place]       [place],   [address]       Ph: [phone]       Fax: [phone]   RxID:   [id number]

## 2010-08-12 NOTE — Assessment & Plan Note (Signed)
Summary: 4 MO ROV /NWS  #   Vital Signs:  Patient profile:   70 year old female Height:      67 inches Weight:      201.25 pounds BMI:     31.63 O2 Sat:      96 % on Room air Temp:     98.7 degrees F oral Pulse rate:   73 / minute BP sitting:   112 / 62  (left arm) Cuff size:   large  Vitals Entered By: Shirlean Mylar Ewing CMA Deborra Medina) (January 27, 2010 8:22 AM)  O2 Flow:  Room air  Preventive Care Screening  Mammogram:    Date:  02/08/2009    Next Due:  02/2010    Results:  normal   Pap Smear:    Date:  04/10/2009    Next Due:  04/2010    Results:  normal   CC: 4 month ROV/RE   CC:  4 month ROV/RE.  History of Present Illness: here to f/u; admits to some dieatry indiscretion and getting zaway from the low chol diet but plans to re-start noe  also with a scar now after a spider bite to the right mid upper arm that is not longer painful  also with a knot to the left axilla that came to a head and drained some 2 days ago, but still red, painfull and with "hard core"   Pt denies CP, sob, doe, wheezing, orthopnea, pnd, worsening LE edema, palps, dizziness or syncope  Pt denies new neuro symptoms such as headache, facial or extremity weakness .    Preventive Screening-Counseling & Management      Drug Use:  no.    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00) 25)  Laminectomy, Hx of  (ICD-V45.89) 26)  Hysterectomy, Hx of  (ICD-V45.77) 27)  Degenerative Disc Disease, Lumbar Spine  (ICD-722.52) 28)  Hx of Gerd  (ICD-530.81) 29)  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 Take One Tablet Twice Daily 2)  Furosemide 40 Mg  Tabs (Furosemide) .... 1/2 - 1 By Mouth Once Daily 3)  Vicodin 5-500 Mg  Tabs (Hydrocodone-Acetaminophen) .[name].. 1 By Mouth Qid As Needed Pain 4)  Omeprazole 20 Mg Cpdr (Omeprazole) .... 2 By Mouth Once Daily 5)  Benazepril Hcl 40 Mg  Tabs (Benazepril Hcl) .[name].. 1 By Mouth Once Daily 6)  Diclofenac Sodium 75 Mg  Tbec (Diclofenac Sodium) .[name].. 1 By Mouth Two  Times A Day Prn 7)  Spironolactone 25 Mg  Tabs (Spironolactone) .... 1/2 By Mouth Qd 8)  Advair Diskus 250-50 Mcg/dose  Misc (Fluticasone-Salmeterol) .[name].. 1 Puff Two Times A Day 9)  Estradiol 1 Mg Tabs (Estradiol) .[name].. 1po Once Daily 10)  Cetirizine Hcl 10 Mg Tabs (Cetirizine Hcl) .[name].. 1 By Mouth Once Daily 11)  Vitamin D3 1000 Unit Caps (Cholecalciferol) .[name].. 1 By Mouth Once Daily 12)  Benadryl 25 Mg Tabs (Diphenhydramine Hcl) .... As Needed 13)  Promethazine Hcl 25 Mg Tabs (Promethazine Hcl) .[name].. 1 By Mouth Q 6 Hrs As Needed Nausea 14)  Nasonex 50 Mcg/act Susp (Mometasone Furoate) .... 2 Spray /side Once Daily  Current Medications (verified): 1)  Coreg 25 Mg  Tabs (Carvedilol) .... Take One Tablet Twice Daily 2)  Furosemide 40 Mg  Tabs (Furosemide) .... 1/2 - 1 By Mouth Once Daily 3)  Vicodin 5-500 Mg  Tabs  (Hydrocodone-Acetaminophen) .[name].. 1 By Mouth Qid As Needed Pain 4)  Omeprazole 20 Mg Cpdr (Omeprazole) .... 2 By Mouth Once Daily 5)  Benazepril Hcl 40 Mg  Tabs (Benazepril Hcl) .[name].. 1 By Mouth Once Daily 6)  Diclofenac Sodium 75 Mg  Tbec (Diclofenac Sodium) .[name].. 1 By Mouth Two Times A Day Prn 7)  Spironolactone 25 Mg  Tabs (Spironolactone) .... 1/2 By Mouth Qd 8)  Advair Diskus 250-50 Mcg/dose  Misc (Fluticasone-Salmeterol) .[name].. 1 Puff Two Times A Day 9)  Estradiol 1 Mg Tabs (Estradiol) .[name].. 1po Once Daily 10)  Cetirizine Hcl 10 Mg Tabs (Cetirizine Hcl) .[name].. 1 By Mouth Once Daily 11)  Vitamin D3 1000 Unit Caps (Cholecalciferol) .[name].. 1 By Mouth Once Daily 12)  Benadryl 25 Mg Tabs (Diphenhydramine Hcl) .... As Needed 13)  Promethazine Hcl 25 Mg Tabs (Promethazine Hcl) .[name].. 1 By Mouth Q 6 Hrs As Needed Nausea 14)  Nasonex 50 Mcg/act Susp (Mometasone Furoate) .... 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 HYPERTENSION (ICD-401.9) Anemia-iron deficiency Hyperlipidemia Allergic rhinitis Congestive heart failure Anxiety GERD low vit d Anemia-NOS  Past Surgical History: Last updated: 05/07/2007 LAMINECTOMY, HX OF (ICD-V45.89) HYSTERECTOMY, HX OF (ICD-V45.77)  Family History: Last updated: 05/23/2007 Family History Kidney disease Family History of Arthritis Family History of CAD Female 1st degree relative Family History of Stroke F 1st degree relative - mother  Social History: Last updated: 01/27/2010 Never Smoked Alcohol use-no 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         all otherwise negative per pt -    Physical Exam  General:  alert and overweight-appearing.   Head:  normocephalic  and atraumatic.   Eyes:  vision grossly intact, pupils equal, and pupils round.   Ears:  R ear normal and L ear normal.   Nose:  no external deformity and no nasal discharge.   Mouth:  no gingival abnormalities and pharynx pink and moist.   Neck:  supple and no masses.   Lungs:  normal respiratory effort and normal breath sounds.   Heart:  normal rate and regular rhythm.   Abdomen:  soft, non-tender, and normal bowel sounds.   Msk:  no joint tenderness and no joint swelling.   Extremities:  no edema, no erythema  Neurologic:  cranial nerves II-XII intact and strength normal in all extremities.   Skin:  just below the left axilla with 1/2 cm small abscess without drainage Psych:  not anxious appearing and not depressed appearing.     Impression & Recommendations:  Problem # 1:  Preventive Health Care (ICD-V70.0)  Overall doing well, age appropriate education and counseling updated and referral for appropriate preventive services done unless declined, immunizations up to date or declined, diet counseling done if overweight, urged to  quit smoking if smokes , most recent labs reviewed and current ordered if appropriate, ecg reviewed or declined (interpretation per ECG scanned in the EMR if done); information regarding Medicare Prevention requirements given if appropriate; speciality referrals updated as appropriate ; due for f/u colonoscopy  Orders: Gastroenterology Referral (GI)  Problem # 2:  HYPERTENSION (ICD-401.9)  Her updated medication list for this problem includes:    Coreg 25 Mg Tabs (Carvedilol) .[name]... Take one tablet twice daily    Furosemide 40 Mg Tabs (Furosemide) .[name]... 1/2 - 1 by mouth once daily    Benazepril Hcl 40 Mg Tabs (Benazepril hcl) .[name]... 1 by mouth once daily    Spironolactone 25 Mg Tabs (Spironolactone) .[name]... 1/2 by mouth once daily  BP today: 112/62 Prior BP: 144/84 (11/24/2009)  Labs Reviewed: K+: 4.9 (01/25/2010) Creat: : 1.0 (01/25/2010)   Chol: 204 (01/25/2010)   HDL: 54.10 (01/25/2010)   LDL: 132 (02/02/2009)   TG: 101.0 (01/25/2010) stable overall by hx and exam, ok to continue meds/tx as is   Problem # 3:  ABSCESS, AXILLA, LEFT (ICD-682.3)  treat as above, f/u any worsening signs or symptoms   Her updated medication list for this problem includes:    Doxycycline Hyclate 100 Mg Caps (Doxycycline hyclate) .[name]... 1po two times a day  Complete Medication List: 1)  Coreg 25 Mg Tabs (Carvedilol) .... Take one tablet twice daily 2)  Furosemide 40 Mg Tabs (Furosemide) .... 1/2 - 1 by mouth once daily 3)  Vicodin 5-500 Mg Tabs (Hydrocodone-acetaminophen) .[name].. 1 by mouth qid as needed pain 4)  Nexium 40 Mg Cpdr (Esomeprazole magnesium) .[name].. 1 by mouth once daily 5)  Benazepril Hcl 40 Mg Tabs (Benazepril hcl) .[name].. 1 by mouth once daily 6)  Diclofenac Sodium 75 Mg Tbec (Diclofenac sodium) .[name].. 1 by mouth two times a day prn 7)  Spironolactone 25 Mg Tabs (Spironolactone) .... 1/2 by mouth once daily 8)  Advair Diskus 250-50 Mcg/dose Misc (Fluticasone-salmeterol) .[name].. 1 puff two times a  day 9)  Estradiol 1 Mg Tabs (Estradiol) .[name].. 1po once daily 10)  Cetirizine Hcl 10 Mg Tabs (Cetirizine hcl) .[name].. 1 by mouth once daily 11)  Vitamin D3 1000 Unit Caps (Cholecalciferol) .[name].. 1 by mouth once daily 12)  Benadryl 25 Mg Tabs (Diphenhydramine hcl) .... As needed 13)  Nasonex 50 Mcg/act Susp (Mometasone furoate) .... 2 spray [place]  once daily 14)  Doxycycline Hyclate 100 Mg Caps (Doxycycline hyclate) .[name].. 1po two times a day  Patient Instructions: 1)  You will be contacted about the referral(s) to: colonoscopy 2)  Please take all new medications as prescribed 3)  Continue all previous medications as before this visit  4)  Please schedule a follow-up appointment in 1 year or sooner if needed Prescriptions: NASONEX 50 MCG/ACT SUSP (MOMETASONE FUROATE) 2 spray /side once daily  #3 x 3   Entered and Authorized by:   Biagio Borg MD   Signed by:   Biagio Borg MD on 01/27/2010   Method used:   Print then Give to Patient   RxIDJB:[id number] ESTRADIOL 1 MG TABS (ESTRADIOL) 1po once daily  #90 x 3   Entered and Authorized by:   Biagio Borg MD   Signed by:   Biagio Borg MD on 01/27/2010   Method used:   Print then Give to Patient   RxID:   [id number] CETIRIZINE HCL 10 MG TABS (CETIRIZINE HCL) 1 by mouth once daily  #90 x 3   Entered and Authorized by:   Biagio Borg MD   Signed by:   Biagio Borg MD on 01/27/2010   Method used:   Print then Give to Patient   RxID:   [id number] ADVAIR DISKUS 250-50 MCG/DOSE  MISC (FLUTICASONE-SALMETEROL) 1 puff two times a day  #3 x 3   Entered and Authorized by:   Biagio Borg MD   Signed by:   Biagio Borg MD on 01/27/2010   Method used:   Print then Give to Patient   RxID:   [id number] SPIRONOLACTONE 25 MG  TABS (SPIRONOLACTONE) 1/2 by mouth once daily  #45 x 3   Entered and Authorized by:   Biagio Borg MD   Signed by:   Biagio Borg MD on 01/27/2010   Method used:   Print then Give to Patient   RxID:    [id number] DICLOFENAC SODIUM 75 MG  TBEC (DICLOFENAC SODIUM) 1 by mouth two times a day prn  #180 x 3   Entered and Authorized by:   Biagio Borg MD   Signed by:   Biagio Borg MD on 01/27/2010   Method used:   Print then Give to Patient   RxIDOM:[id number] BENAZEPRIL HCL 40 MG  TABS (BENAZEPRIL HCL) 1 by mouth once daily  #90 x 3   Entered and Authorized by:   Biagio Borg MD   Signed by:   Biagio Borg MD on 01/27/2010   Method used:   Print then Give to Patient   RxID:   [id number] [place] 40 MG CPDR (ESOMEPRAZOLE MAGNESIUM) 1 by mouth once daily  #90 x 3   Entered and Authorized by:   Biagio Borg MD   Signed by:   Biagio Borg MD on 01/27/2010   Method used:   Print then Give to Patient   RxID:   [id number] FUROSEMIDE 40 MG  TABS (FUROSEMIDE) 1/2 - 1 by mouth once daily  #90 x 3   Entered and Authorized by:   Biagio Borg MD   Signed by:   Biagio Borg MD on 01/27/2010   Method used:   Print then Give to Patient   RxID:   [id number] COREG 25 MG  TABS (CARVEDILOL) Take one tablet twice daily  #180 x 3   Entered and Authorized  by:   Biagio Borg MD   Signed by:   Biagio Borg MD on 01/27/2010   Method used:   Print then Give to Patient   RxID:   [id number] DOXYCYCLINE HYCLATE 100 MG CAPS (DOXYCYCLINE HYCLATE) 1po two times a day  #20 x 0   Entered and Authorized by:   Biagio Borg MD   Signed by:   Biagio Borg MD on 01/27/2010   Method used:   Print then Give to Patient   RxID:   [id number]

## 2010-08-12 NOTE — Assessment & Plan Note (Signed)
Summary: ROV/pt wants defib checkat this visit per pt call/lg   Referring Provider:  Kirk Ruths  CC:  ROV/pt wants defib check today.  She states that sometimes she is having pain in her arms.  She states other than that she is doing just fine. Her implant site is still hurting or itching. Pt has questions on whether or not she should be able to bike with her grandkids.  .  History of Present Illness: [name] [name] is seen in followup for an ICD implanted for primary prevention in the setting of nonischemic heart disease. She has chronic systolic heart failure with a relatively narrow QRS and underwent single-chamber defibrillator implantation. She has had no intercurrent problems from the device apart from discomfort at the site which is gradually lessened but has not resolved. she has intermittent atypical chest pains. Denies shortness of breath peripheral edema or lightheadedness  Current Medications (verified): 1)  Coreg 25 Mg  Tabs (Carvedilol) .... Take One Tablet Twice Daily 2)  Furosemide 40 Mg  Tabs (Furosemide) .... 1/2 - 1 By Mouth Once Daily 3)  Vicodin 5-500 Mg  Tabs (Hydrocodone-Acetaminophen) .[name].. 1 By Mouth Qid As Needed Pain 4)  Omeprazole 20 Mg Cpdr (Omeprazole) .... 2 By Mouth Once Daily 5)  Benazepril Hcl 40 Mg  Tabs (Benazepril Hcl) .[name].. 1 By Mouth Once Daily 6)  Diclofenac Sodium 75 Mg  Tbec (Diclofenac Sodium) .[name].. 1 By Mouth Two Times A Day Prn 7)  Spironolactone 25 Mg  Tabs (Spironolactone) .... 1/2 By Mouth Qd 8)  Advair Diskus 250-50 Mcg/dose  Misc (Fluticasone-Salmeterol) .[name].. 1 Puff Two Times A Day 9)  Estradiol 1 Mg Tabs (Estradiol) .[name].. 1po Once Daily 10)  Cetirizine Hcl 10 Mg Tabs (Cetirizine Hcl) .[name].. 1 By Mouth Once Daily 11)  Vitamin D3 1000 Unit Caps (Cholecalciferol) .[name].. 1 By Mouth Once Daily 12)  Benadryl 25 Mg Tabs (Diphenhydramine Hcl) .... As Needed 13)  Promethazine Hcl 25 Mg Tabs (Promethazine Hcl) .[name].. 1 By Mouth Q 6 Hrs As Needed Nausea 14)   Nasonex 50 Mcg/act Susp (Mometasone Furoate) .... 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 HYPERTENSION (ICD-401.9) Anemia-iron deficiency Hyperlipidemia Allergic rhinitis Congestive heart failure Anxiety GERD low vit d Anemia-NOS  Past Surgical History: Last updated: 05/07/2007 LAMINECTOMY, HX OF (ICD-V45.89) HYSTERECTOMY, HX OF (ICD-V45.77)  Family History: Last updated: 05/23/2007 Family History Kidney disease Family History of Arthritis Family History of CAD Female 1st degree relative Family History of Stroke F 1st degree relative - mother  Social History: Last updated: 06/17/2008 Never Smoked Alcohol use-no 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 HEENT Normal.  Neck veins were flat, carotids were brisk.  Lungs were clear.  Heart sounds were  regular without murmurs or gallops.  Abdomen was soft with active bowel sounds. There is no clubbing cyanosis or edema. Skin Warm and dry; trunk and keloid at the incision site    EKG  Procedure date:  11/24/2009  Findings:      sinus rhythm at 61 Intervals 0.14/0.09/0.43 Left ventricular hypertrophy with repolarization abnormalities Axis is 6   ICD Specifications Following MD:   [name], MD     ICD Vendor:  [employer]     ICD Model Number:  [id number]     ICD Serial Number:  [id number] ICD DOI:  06/25/2007     ICD Implanting MD:  [name], MD  Lead 1:    Location: RV     DOI: 06/25/2007     Model #: [id number]     Serial #: [id number]     Status: active  Indications::  NICM   ICD Follow Up Remote Check?  No Battery Voltage:  3.17 V     Charge Time:  7.28 seconds     Underlying rhythm:  SR ICD Dependent:  No       ICD Device Measurements Right Ventricle:  Amplitude: 14.8 mV, Impedance: 440 ohms, Threshold: 1.0 V at 0.4 msec Shock Impedance: 34/42 ohms   Episodes Shock:  0     ATP:  0     Nonsustained:  147      Brady Parameters Mode VVI     Lower Rate Limit:  40      Tachy Zones VF:  200     VT:  250     VT1:  130 (MONITOR)     Impression & Recommendations:  Problem # 1:  CARDIOMYOPATHY, PRIMARY, DILATED (ICD-425.4) stable on her current medications Her updated medication list for this problem includes:    Coreg 25 Mg Tabs (Carvedilol) .[name]... Take one tablet twice daily    Furosemide 40 Mg Tabs (Furosemide) .[name]... 1/2 - 1 by mouth once daily    Benazepril Hcl 40 Mg Tabs (Benazepril hcl) .[name]... 1 by mouth once daily    Spironolactone 25 Mg Tabs (Spironolactone) .[name]... 1/2 by mouth qd  Problem # 2:  SYSTOLIC HEART FAILURE, CHRONIC (ICD-428.22) stable on current meds  Problem # 3:  Hx of TACHYCARDIA, PAROXYSMAL ATRIAL (ICD-427.0) patient has tachycardia detected by her device. It appears to be sinus. The device monitoring zone is reprogrammed. Her updated medication list for this problem includes:    Coreg 25 Mg Tabs (Carvedilol) .[name]... Take one tablet twice daily    Benazepril Hcl 40 Mg Tabs (Benazepril hcl) .[name]... 1 by mouth once daily  Problem # 4:  IMPLANTABLE DEFIBRILLATOR MDT (ICD-V45.02) Device parameters and data were reviewed and no changes were made apart from raising the monitoring zone detection rate 130-150 beats per minute  Patient  Instructions: 1)  Your physician recommends that you schedule a follow-up appointment in: three-month device clinic

## 2010-08-20 ENCOUNTER — Encounter (INDEPENDENT_AMBULATORY_CARE_PROVIDER_SITE_OTHER): Payer: MEDICARE

## 2010-08-20 ENCOUNTER — Ambulatory Visit (INDEPENDENT_AMBULATORY_CARE_PROVIDER_SITE_OTHER): Payer: MEDICARE | Admitting: Cardiology

## 2010-08-20 ENCOUNTER — Encounter: Payer: Self-pay | Admitting: Internal Medicine

## 2010-08-20 ENCOUNTER — Encounter: Payer: Self-pay | Admitting: Cardiology

## 2010-08-20 ENCOUNTER — Other Ambulatory Visit: Payer: Self-pay | Admitting: Cardiology

## 2010-08-20 DIAGNOSIS — I10 Essential (primary) hypertension: Secondary | ICD-10-CM

## 2010-08-20 DIAGNOSIS — R0602 Shortness of breath: Secondary | ICD-10-CM

## 2010-08-20 DIAGNOSIS — I428 Other cardiomyopathies: Secondary | ICD-10-CM

## 2010-08-20 LAB — BASIC METABOLIC PANEL
Chloride: 103 mEq/L (ref 96–112)
GFR: 61.63 mL/min (ref >60.00)
Potassium: 4.3 mEq/L (ref 3.5–5.1)
Sodium: 139 mEq/L (ref 135–145)

## 2010-08-20 LAB — BRAIN NATRIURETIC PEPTIDE: Pro B Natriuretic peptide (BNP): 55.2 pg/mL (ref 0.0–100.0)

## 2010-08-26 NOTE — Assessment & Plan Note (Signed)
Summary: wants follow up/wants defib checked/mt rs from bump list-mb r...   Referring Provider:  Kirk Ruths  CC:  dizziness.  History of Present Illness: [name] [name] is a very pleasant  female who has a history of nonischemic cardiomyopathy.  Her ejection fraction in November 2008 was 20-30% with mild mitral regurgitation by echocardiogram.  Most recent Myoview in November 2008 showed an ejection fraction of 34%.  There was mild apical thinning and a mild degree of ischemia cannot be excluded. Low risk.  We have treated her medically.  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 I last saw her in July of 2009. Since then, the patient has dyspnea with more extreme activities but not with routine activities. It is relieved with rest. It is not associated with chest pain. There is no orthopnea, PND or pedal edema. There is no syncope or palpitations. There is no exertional chest pain.   Current Medications (verified): 1)  Coreg 25 Mg  Tabs (Carvedilol) .... Take One Tablet Twice Daily 2)  Furosemide 40 Mg  Tabs (Furosemide) .... 1/2 - 1 By Mouth Once Daily 3)  Vicodin 5-500 Mg  Tabs (Hydrocodone-Acetaminophen) .[name].. 1 By Mouth Qid As Needed Pain 4)  Nexium 40 Mg Cpdr (Esomeprazole Magnesium) .[name].. 1 By Mouth Once Daily 5)  Benazepril Hcl 40 Mg  Tabs (Benazepril Hcl) .[name].. 1 By Mouth Once Daily 6)  Diclofenac Sodium 75 Mg  Tbec (Diclofenac Sodium) .[name].. 1 By Mouth Two Times A Day Prn 7)  Spironolactone 25 Mg  Tabs (Spironolactone) .... 1/2 By Mouth Once Daily 8)  Advair Diskus 250-50 Mcg/dose  Misc (Fluticasone-Salmeterol) .[name].. 1 Puff Two Times A Day 9)  Estradiol 1 Mg Tabs (Estradiol) .[name].. 1po Once Daily 10)  Cetirizine Hcl 10 Mg Tabs (Cetirizine Hcl) .[name].. 1 By Mouth Once Daily 11)  Vitamin D3 1000 Unit Caps (Cholecalciferol) .[name].. 1 By Mouth Once Daily 12)  Benadryl 25 Mg Tabs (Diphenhydramine Hcl) .... As Needed 13)  Nasonex 50 Mcg/act Susp (Mometasone Furoate) .... 2 Spray /side Once  Daily 14)  Flonase 50 Mcg/act Susp (Fluticasone Propionate) .... 2 Spray/side Once Daily 15)  Oxybutynin Chloride 5 Mg Xr24h-Tab (Oxybutynin Chloride) .[name].. 1po Two Times A Day  Allergies: No Known Drug Allergies  Past History:  Past Medical History: Reviewed history from 06/17/2008 and no changes required. 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 Remaining systems are negative.   Vital Signs:  Patient profile:   70 year old female Height:      67 inches Weight:      208 pounds BMI:     32.70 Pulse rate:   72 / minute Resp:     14 per minute BP sitting:   135 / 76  (left arm)  Vitals Entered By: Burnett Kanaris (August 20, 2010 9:21  AM)  Physical Exam  General:  Well-developed well-nourished in no acute distress.  Skin is warm and dry.  HEENT is normal.  Neck is supple. No thyromegaly.  Chest is clear to auscultation with normal expansion.  Cardiovascular exam is regular rate and rhythm.  Abdominal exam nontender or distended. No masses palpated. Extremities show no edema. neuro grossly intact    EKG  Procedure date:   08/20/2010  Findings:      Sinus rhythm with nonspecific ST changes.   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 Repeat echocardiogram. Check potassium, renal function and BNP. Her updated medication list for this problem includes:    Coreg 25 Mg Tabs (Carvedilol) .[name]... Take one tablet twice daily    Furosemide 40 Mg Tabs (Furosemide) .[name]... 1/2 - 1 by mouth once daily    Benazepril Hcl 40 Mg Tabs (Benazepril hcl) .[name]... 1 by mouth once daily    Spironolactone 25 Mg Tabs (Spironolactone) .[name]... 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 Continue present dose of diuretic. Her updated medication list for this problem includes:    Coreg 25 Mg Tabs (Carvedilol) .[name]... Take one tablet twice daily    Furosemide 40 Mg Tabs (Furosemide) .[name]... 1/2 - 1 by mouth once daily    Benazepril Hcl 40 Mg Tabs (Benazepril hcl) .[name]... 1 by mouth once daily    Spironolactone 25 Mg Tabs (Spironolactone) .[name]... 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 Her updated medication list for this problem includes:    Coreg 25 Mg Tabs (Carvedilol) .[name]... Take one tablet twice daily    Benazepril Hcl 40 Mg Tabs (Benazepril hcl) .[name]... 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  Problem # 5:  HYPERLIPIDEMIA (B2193296.4) Management per primary care.  Problem # 6:  HYPERTENSION (ICD-401.9)  Blood pressure controlled on present medications. Will continue. Her updated medication list for this problem includes:    Coreg 25 Mg Tabs (Carvedilol) .[name]... Take one tablet twice daily    Furosemide 40 Mg Tabs (Furosemide) .[name]... 1/2 - 1 by mouth once daily    Benazepril Hcl 40 Mg Tabs (Benazepril hcl) .[name]... 1 by mouth once daily    Spironolactone 25 Mg Tabs (Spironolactone) .[name]... 1/2 by mouth once daily  Orders: TLB-BMP (Basic Metabolic Panel-BMET) ([id number])  Patient Instructions: 1)  Your physician wants you to follow-up in: [place] will  receive a reminder letter in the mail two months in advance. If you don't receive a letter, please call our office to schedule the follow-up appointment. 2)  Your physician has requested that you have an echocardiogram.  Echocardiography is a painless test that uses sound waves to create images of your heart. It provides your doctor with information about the size and shape of your heart and how well your heart's chambers and valves are working.  This procedure takes approximately one hour. There are no restrictions for this procedure.

## 2010-08-26 NOTE — Assessment & Plan Note (Signed)
Summary: f/u appt/#/cd   Vital Signs:  Patient profile:   70 year old female Height:      67 inches Weight:      207 pounds BMI:     32.54 O2 Sat:      99 % on Room air Temp:     98.5 degrees F oral Pulse rate:   73 / minute BP sitting:   112 / 62  (left arm) Cuff size:   large  Vitals Entered By: Shirlean Mylar Ewing CMA Deborra Medina) (August 03, 2010 9:33 AM)  O2 Flow:  Room air CC: Left ear pain and drainage/RE   CC:  Left ear pain and drainage/RE.  History of Present Illness: here for acute visiti with 2 wks left ear pain without drainage but "feels like its leaking" and with tinnitus as well;  no hearing loss, but does have  headache, left facial pain, fever, sinus pressure and congestion that was mixed with blood (found with coughing as well as nasal drainage);  no other chest symptoms - Pt denies CP, worsening sob, doe, wheezing, orthopnea, pnd, worsening LE edema, palps, dizziness or syncope  Pt denies new neuro symptoms such as headache, facial or extremity weakness  Pt denies polydipsia, polyuria   Overall good compliance with meds, trying to follow low chol diet, wt stable, little excercise however No recent wt loss, night sweats, loss of appetite or other constitutional symptoms Also wtih ongoing months of nasal allergy symptoms with clearish drainage and sneezing not well controlled, and also with ongoing chronic stable urinary freq, without dysuria, urgency or hematuria c/w OAB but could not afford the vesicare, though previous samples did help.    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) 14)  Vitamin D Deficiency  (ICD-268.9) 15)  Cellulitis, Leg, Left  (ICD-682.6) 16)  Anemia-nos  (ICD-285.9) 17)  Preventive Health Care  (ICD-V70.0) 18)  Menopausal Disorder  (ICD-627.9) 19)  Anxiety  (ICD-300.00) 20)  Chest Pain  (ICD-786.50) 21)  Hematuria Unspecified  (ICD-599.70) 22)  Allergic Rhinitis  (ICD-477.9) 23)  Osteoarthritis, Knees, Bilateral  (ICD-715.96) 24)  Preventive Health Care  (ICD-V70.0) 25)  Family History of Cad Female 1st Degree Relative <60  (ICD-V16.49) 26)  Hyperlipidemia  (ICD-272.4) 27)  Anemia-iron Deficiency  (ICD-280.9) 28)  Pyelonephritis, Hx of  (ICD-V13.00) 29)  Laminectomy, Hx of  (ICD-V45.89) 30)  Hysterectomy, Hx of  (ICD-V45.77) 31)  Degenerative Disc Disease, Lumbar Spine  (ICD-722.52) 32)  Hx of Gerd  (ICD-530.81) 33)  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 Take One Tablet Twice Daily 2)  Furosemide 40 Mg  Tabs (Furosemide) .... 1/2 - 1 By Mouth Once Daily 3)  Vicodin 5-500 Mg  Tabs (Hydrocodone-Acetaminophen) .[name].. 1 By Mouth Qid As Needed Pain 4)  Nexium 40 Mg Cpdr (Esomeprazole Magnesium) .[name].. 1 By Mouth Once Daily 5)  Benazepril Hcl 40  Mg  Tabs (Benazepril Hcl) .[name].. 1 By Mouth Once Daily 6)  Diclofenac Sodium 75 Mg  Tbec (Diclofenac Sodium) .[name].. 1 By Mouth Two Times A Day Prn 7)  Spironolactone 25 Mg  Tabs (Spironolactone) .... 1/2 By Mouth Once Daily 8)  Advair Diskus 250-50 Mcg/dose  Misc (Fluticasone-Salmeterol) .[name].. 1 Puff Two Times A Day 9)  Estradiol 1 Mg Tabs (Estradiol) .[name].. 1po Once Daily 10)  Cetirizine Hcl 10 Mg Tabs (Cetirizine Hcl) .[name].. 1 By Mouth Once Daily 11)  Vitamin D3 1000 Unit Caps (Cholecalciferol) .[name].. 1 By Mouth Once Daily 12)  Benadryl 25 Mg Tabs (Diphenhydramine Hcl)  .... As Needed 13)  Nasonex 50 Mcg/act Susp (Mometasone Furoate) .... 2 Spray /side Once Daily 14)  Doxycycline Hyclate 100 Mg Caps (Doxycycline Hyclate) .[name].. 1po Two Times A Day 15)  Flonase 50 Mcg/act Susp (Fluticasone Propionate) .... 2 Spray/side Once Daily  Current Medications (verified): 1)  Coreg 25 Mg  Tabs (Carvedilol) .... Take One Tablet Twice Daily 2)  Furosemide 40 Mg  Tabs (Furosemide) .... 1/2 - 1 By Mouth Once Daily 3)  Vicodin 5-500 Mg  Tabs (Hydrocodone-Acetaminophen) .[name].. 1 By Mouth Qid As Needed Pain 4)  Nexium 40 Mg Cpdr (Esomeprazole Magnesium) .[name].. 1 By Mouth Once Daily 5)  Benazepril Hcl 40 Mg  Tabs (Benazepril Hcl) .[name].. 1 By Mouth Once Daily 6)  Diclofenac Sodium 75 Mg  Tbec (Diclofenac Sodium) .[name].. 1 By Mouth Two Times A Day Prn 7)  Spironolactone 25 Mg  Tabs (Spironolactone) .... 1/2 By Mouth Once Daily 8)  Advair Diskus 250-50 Mcg/dose  Misc (Fluticasone-Salmeterol) .[name].. 1 Puff Two Times A Day 9)  Estradiol 1 Mg Tabs (Estradiol) .[name].. 1po Once Daily 10)  Cetirizine Hcl 10 Mg Tabs (Cetirizine Hcl) .[name].. 1 By Mouth Once Daily 11)  Vitamin D3 1000 Unit Caps (Cholecalciferol) .[name].. 1 By Mouth Once Daily 12)  Benadryl 25 Mg Tabs (Diphenhydramine Hcl) .... As Needed 13)  Nasonex 50 Mcg/act Susp (Mometasone Furoate) .... 2 Spray /side Once Daily 14)  Flonase 50 Mcg/act Susp (Fluticasone Propionate) .... 2 Spray/side Once Daily 15)  Doxycycline Hyclate 100 Mg Caps (Doxycycline Hyclate) .[name].. 1po Two Times A Day 16)  Oxybutynin Chloride 5 Mg Xr24h-Tab (Oxybutynin Chloride) .[name].. 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 * NONISCHEMIC CARDIOMYOPATHY HYPERTENSION  (ICD-401.9) Anemia-iron deficiency Hyperlipidemia Allergic rhinitis Congestive heart failure Anxiety GERD low vit d Anemia-NOS  Past Surgical History: Last updated: 05/07/2007 LAMINECTOMY, HX OF (ICD-V45.89) HYSTERECTOMY, HX OF (ICD-V45.77)  Social History: Last updated: 01/27/2010 Never Smoked Alcohol use-no 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   Eyes:  vision grossly intact, pupils equal, and pupils round.   Ears:  bilat tm's mild erythema, sinus tender bilat Nose:  nasal dischargemucosal pallor and mucosal edema.   Mouth:  pharyngeal erythema and fair dentition.   Neck:  supple and no masses.   Lungs:  normal respiratory effort and normal breath sounds.  Heart:  normal rate and regular rhythm.   Abdomen:  soft, non-tender, and normal bowel sounds.   Msk:  no joint tenderness and no joint swelling.  , no bilat flank tender as well Extremities:  no edema, no erythema    Impression & Recommendations:  Problem # 1:  SINUSITIS- ACUTE-NOS (ICD-461.9)  The following medications were removed from the medication list:    Doxycycline Hyclate 100 Mg Caps (Doxycycline hyclate) .[name]... 1po two times a day Her updated medication list for this problem includes:    Nasonex 50 Mcg/act Susp (Mometasone furoate) .[name]... 2 spray /side once daily    Flonase 50 Mcg/act Susp (Fluticasone propionate) .[name]... 2 spray/side once daily    Doxycycline Hyclate 100 Mg Caps (Doxycycline hyclate) .[name]... 1po two times a day treat as above, f/u any worsening signs or symptoms   Instructed on treatment. Call if symptoms persist or worsen.   Problem # 2:  UNSPECIFIED EUSTACHIAN TUBE DISORDER (ICD-381.9) for mucinex otc as  needed   Problem # 3:  ALLERGIC RHINITIS (ICD-477.9) Assessment: Deteriorated  Her updated medication list for this problem includes:    Cetirizine Hcl 10 Mg Tabs (Cetirizine hcl) .[name]... 1 by mouth once daily    Benadryl 25 Mg Tabs (Diphenhydramine hcl) .[name]... As needed    Nasonex 50 Mcg/act Susp (Mometasone furoate) .[name]... 2 spray /side once daily    Flonase 50 Mcg/act Susp (Fluticasone propionate) .[name]... 2 spray/side once daily stable overall by hx and exam, ok to continue meds/tx as is   - to re-start meds  Discussed use of allergy medications and environmental measures.   Problem # 4:  OVERACTIVE BLADDER (ICD-596.51) Assessment: Deteriorated gave some samples of vesicare today, but as it is not covered with her insurance - will also give rx of oxybutinin  Complete Medication List: 1)  Coreg 25 Mg Tabs (Carvedilol) .... Take one tablet twice daily 2)  Furosemide 40 Mg Tabs (Furosemide) .... 1/2 - 1 by mouth once daily 3)  Vicodin 5-500 Mg Tabs (Hydrocodone-acetaminophen) .[name].. 1 by mouth qid as needed pain 4)  Nexium 40 Mg Cpdr (Esomeprazole magnesium) .[name].. 1 by mouth once daily 5)  Benazepril Hcl 40 Mg Tabs (Benazepril hcl) .[name].. 1 by mouth once daily 6)  Diclofenac Sodium 75 Mg Tbec (Diclofenac sodium) .[name].. 1 by mouth two times a day prn 7)  Spironolactone 25 Mg Tabs (Spironolactone) .... 1/2 by mouth once daily 8)  Advair Diskus 250-50 Mcg/dose Misc (Fluticasone-salmeterol) .[name].. 1 puff two times a day 9)  Estradiol 1 Mg Tabs (Estradiol) .[name].. 1po once daily 10)  Cetirizine Hcl 10 Mg Tabs (Cetirizine hcl) .[name].. 1 by mouth once daily 11)  Vitamin D3 1000 Unit Caps (Cholecalciferol) .[name].. 1 by mouth once daily 12)  Benadryl 25 Mg Tabs (Diphenhydramine hcl) .... As needed 13)  Nasonex 50 Mcg/act Susp (Mometasone furoate) .... 2 spray /side once daily 14)  Flonase 50 Mcg/act Susp (Fluticasone propionate) .... 2 spray/side once daily 15)  Doxycycline Hyclate 100 Mg Caps (Doxycycline hyclate)  .[name].. 1po two times a day 16)  Oxybutynin Chloride 5 Mg Xr24h-tab (Oxybutynin chloride) .[name].. 1po two times a day  Patient Instructions: 1)  Please take all new medications as prescribed 2)  Continue all previous medications as before this visit  3)  Please schedule a follow-up appointment in 6 months  - July 2012 for CPX with labs Prescriptions: OXYBUTYNIN CHLORIDE 5 MG XR24H-TAB (OXYBUTYNIN CHLORIDE) 1po two times a day  #60 x 11  Entered and Authorized by:   Biagio Borg MD   Signed by:   Biagio Borg MD on 08/03/2010   Method used:   Print then Give to Patient   RxID:   [id number] DOXYCYCLINE HYCLATE 100 MG CAPS (DOXYCYCLINE HYCLATE) 1po two times a day  #20 x 0   Entered and Authorized by:   Biagio Borg MD   Signed by:   Biagio Borg MD on 08/03/2010   Method used:   Print then Give to Patient   RxID:   [id number]    Orders Added: 1)  Est. Patient Level IV [id number]

## 2010-08-30 ENCOUNTER — Other Ambulatory Visit (HOSPITAL_COMMUNITY): Payer: MEDICARE

## 2010-09-07 NOTE — Procedures (Signed)
Summary: [facility]   Current Medications (verified): 1)  Coreg 25 Mg  Tabs (Carvedilol) .... Take One Tablet Twice Daily 2)  Furosemide 40 Mg  Tabs (Furosemide) .... 1/2 - 1 By Mouth Once Daily 3)  Vicodin 5-500 Mg  Tabs (Hydrocodone-Acetaminophen) .[name].. 1 By Mouth Qid As Needed Pain 4)  Nexium 40 Mg Cpdr (Esomeprazole Magnesium) .[name].. 1 By Mouth Once Daily 5)  Benazepril Hcl 40 Mg  Tabs (Benazepril Hcl) .[name].. 1 By Mouth Once Daily 6)  Diclofenac Sodium 75 Mg  Tbec (Diclofenac Sodium) .[name].. 1 By Mouth Two Times A Day Prn 7)  Spironolactone 25 Mg  Tabs (Spironolactone) .... 1/2 By Mouth Once Daily 8)  Advair Diskus 250-50 Mcg/dose  Misc (Fluticasone-Salmeterol) .[name].. 1 Puff Two Times A Day 9)  Estradiol 1 Mg Tabs (Estradiol) .[name].. 1po Once Daily 10)  Cetirizine Hcl 10 Mg Tabs (Cetirizine Hcl) .[name].. 1 By Mouth Once Daily 11)  Vitamin D3 1000 Unit Caps (Cholecalciferol) .[name].. 1 By Mouth Once Daily 12)  Benadryl 25 Mg Tabs (Diphenhydramine Hcl) .... As Needed 13)  Nasonex 50 Mcg/act Susp (Mometasone Furoate) .... 2 Spray /side Once Daily 14)  Flonase 50 Mcg/act Susp (Fluticasone Propionate) .... 2 Spray/side Once Daily 15)  Doxycycline Hyclate 100 Mg Caps (Doxycycline Hyclate) .[name].. 1po Two Times A Day 16)  Oxybutynin Chloride 5 Mg Xr24h-Tab (Oxybutynin Chloride) .[name].. 1po Two Times A Day  Allergies (verified): No Known Drug Allergies   ICD Specifications Following MD:  [name], MD     ICD Vendor:  [employer]     ICD Model Number:  [id number]     ICD Serial Number:  [id number] ICD DOI:  06/25/2007     ICD Implanting MD:  [name], MD  Lead 1:    Location: RV     DOI: 06/25/2007     Model #: [id number]     Serial #: [id number]     Status: active  Indications::  NICM   ICD Follow Up ICD Dependent:  No      Brady Parameters Mode VVI     Lower Rate Limit:  40      Tachy Zones VF:  200     VT:  250     VT1:  130 (MONITOR)

## 2010-09-07 NOTE — Cardiovascular Report (Signed)
Summary: Office Visit   Office Visit   Imported By: Sallee Provencal 09/03/2010 09:06:01  _____________________________________________________________________  External Attachment:    Type:   Image     Comment:   External Document

## 2010-09-08 ENCOUNTER — Other Ambulatory Visit (HOSPITAL_COMMUNITY): Payer: MEDICARE

## 2010-09-10 ENCOUNTER — Telehealth: Payer: Self-pay | Admitting: Internal Medicine

## 2010-09-16 NOTE — Progress Notes (Signed)
Summary: ABX refill  Phone Note Call from Patient Call back at Work Phone [phone]   Caller: Patient Summary of Call: Pt called stating that ear infection has returned, c/o ear pain and pressure. Pt is requesting stronger ABX to [facility].  Initial call taken by: Crissie Sickles, [place],  September 10, 2010 4:26 PM  Follow-up for Phone Call        done per emr Follow-up by: Biagio Borg MD,  September 10, 2010 4:31 PM  Additional Follow-up for Phone Call Additional follow up Details #1::        pt informed Additional Follow-up by: Crissie Sickles, CMA,  September 10, 2010 5:11 PM    New/Updated Medications: LEVOFLOXACIN 250 MG TABS (LEVOFLOXACIN) 1po once daily Prescriptions: LEVOFLOXACIN 250 MG TABS (LEVOFLOXACIN) 1po once daily  #10 x 0   Entered and Authorized by:   Biagio Borg MD   Signed by:   Biagio Borg MD on 09/10/2010   Method used:   Electronically to        [facility]  [id number] [facility] (retail)       [address], [place]       [place], [place]  [address]       Ph: [phone]       Fax: [phone]   RxID:   [id number]

## 2010-10-05 ENCOUNTER — Other Ambulatory Visit (HOSPITAL_COMMUNITY): Payer: Self-pay | Admitting: Radiology

## 2010-10-05 DIAGNOSIS — I428 Other cardiomyopathies: Secondary | ICD-10-CM

## 2010-10-06 ENCOUNTER — Telehealth: Payer: Self-pay | Admitting: *Deleted

## 2010-10-06 ENCOUNTER — Ambulatory Visit (HOSPITAL_COMMUNITY): Payer: Medicare Other | Attending: Internal Medicine | Admitting: Radiology

## 2010-10-06 DIAGNOSIS — I428 Other cardiomyopathies: Secondary | ICD-10-CM | POA: Insufficient documentation

## 2010-10-06 NOTE — Telephone Encounter (Signed)
Received phone call from echo tech that patient reported during her echo test that she has had numbness in the shoulders, arms, and fingers on and off for about 2 weeks. Advised will report findings to Dr.Crenshaw and follow up with the patient after he reviews her heart ultrasound.

## 2010-10-07 NOTE — Telephone Encounter (Signed)
Ok; will review echo

## 2010-10-08 ENCOUNTER — Encounter: Payer: Self-pay | Admitting: Internal Medicine

## 2010-10-12 ENCOUNTER — Other Ambulatory Visit: Payer: Self-pay

## 2010-10-12 MED ORDER — HYDROCODONE-ACETAMINOPHEN 5-500 MG PO TABS: 1.0000 | ORAL_TABLET | ORAL | Status: DC | PRN

## 2010-10-12 NOTE — Telephone Encounter (Signed)
Done hardcopy to dahlia/LIM B  

## 2010-10-12 NOTE — Telephone Encounter (Signed)
[facility] requesting refill on Hydrocodone-Acetaminophen 5-500. Last refill 07/13/10 #120 with 2 refills, last OV 08/20/10.

## 2010-10-12 NOTE — Telephone Encounter (Signed)
Faxed hardcopy to pharmacy. 

## 2010-10-25 ENCOUNTER — Telehealth: Payer: Self-pay | Admitting: Cardiology

## 2010-10-26 NOTE — Telephone Encounter (Signed)
Spoke with pt, she is aware of echo results Fredia Beets

## 2010-11-23 ENCOUNTER — Other Ambulatory Visit: Payer: Self-pay | Admitting: Internal Medicine

## 2010-11-23 NOTE — Op Note (Signed)
[name] [name], GORZYNSKI               ACCOUNT NO.:  [id number]   MEDICAL RECORD NO.:  [id number]          PATIENT TYPE:  INP   LOCATION:  2856                         FACILITY:  [facility]   PHYSICIAN:  Deboraha Sprang, MD, FACCDATE OF BIRTH:  06-28-1941   DATE OF PROCEDURE:  06/25/2007  DATE OF DISCHARGE:                               OPERATIVE REPORT   PREOPERATIVE DIAGNOSES:  1. Cardiomyopathy.  2. Congestive heart failure.  3. Narrow QRS.   POSTOPERATIVE DIAGNOSES:  1. Cardiomyopathy.  2. Congestive heart failure.  3. Narrow QRS.   PROCEDURE:  Implantable cardioverter-defibrillator implantation with  intraoperative defibrillation threshold testing.   ATTENDING:  Deboraha Sprang, MD.   DESCRIPTION OF PROCEDURE:  Following the attainment of informed consent,  the patient was brought to the electrophysiology laboratory and placed  on the fluoroscopic table in a supine position.  After routine prep and  drape of the left upper chest, lidocaine was infiltrated in the  prepectoral subclavicular region and incision was made and carried down  to a layer of the prepectoral fascia using electrocautery and sharp  dissection.  A pocket was formed similarly.  Hemostasis was obtained.   Thereafter, attention was turned to gaining access to the  extrathoracic/left subclavian vein, which was accomplished without  difficulty without the aspiration of air or puncture of the artery.  A 9-  French sheath was placed and through this was passed a Medtronic [id number]  active-fixation dual-coil defibrillator lead, serial number [id number].  Under fluoroscopic guidance, the lead was manipulated to the right  ventricular apex, where the bipolar R wave was 19.7 with a pacing  impedance of 91 ohms and a threshold 0.6 volts at 0.5 milliseconds.  Currented threshold was is 3.8 mA.  There was no diaphragmatic pacing at  10 volts and the current of injury was brisk.  The lead was secured to  the prepectoral fascia  and then attached to a Summit ICD,  serial number [id number].  Through the device, the bipolar R wave was 12  with pacing impedance of 728 ohms and a threshold of 1 volt at 0.2  milliseconds.  The proximal coil impedance was 49; distal coil impedance  was 41.  With these acceptable parameters recorded, defibrillation  threshold testing was undertaken.  Ventricular fibrillation was induced  via the T-wave shock.  After a total duration of 6 seconds, a 15-joule  shock was delivered through a measured resistance of 39 ohms,  terminating ventricular fibrillation and restoring sinus rhythm.  The  device was implanted.  The pocket was copiously irrigated with  antibiotic-containing saline solution.  Hemostasis was assured and the  lead and the pulse generator were placed in the pocket and secured to  the prepectoral fascia.  The wound was then closed in 3 layers in the  normal fashion.  The wound was washed and dried and a benzoin and Steri-Strip dressing  was applied.  Needle counts, sponge counts and instrument counts were  correct at the end of procedure according to the staff.  The patient  tolerated the procedure without apparent complication.  Deboraha Sprang, MD, [facility]  Electronically Signed     SCK/MEDQ  D:  06/25/2007  T:  06/26/2007  Job:  [id number]   cc:   [facility]

## 2010-11-23 NOTE — Letter (Signed)
June 18, 2007    Denice Bors. Stanford Breed, MD, [place] [place], [place] [address]   RE:  [name], [name]  MRN:  [id number]  /  DOB:  03/28/1941   Dear Aaron Edelman:   It was a pleasure to see your patient, [name] [name], yesterday in  consultation regarding ICD implantation in the context of non-ischemic  heart disease.   As you know, she is a delightful 70 year old African American woman who  has a history of a non-ischemic cardiomyopathy identified initially by  catheterization in 2004. Her ejection fraction at that time was 28%. She  has had repeated evaluations of her LV function and it remains in the 25-  35% range.   She has developed significant congestive heart failure. She is short of  breath at less than a flight of stairs and less than 200 feet. She does  not have peripheral edema. She does have nocturnal dyspnea and orthopnea  on three pillows.   She also has a history of recurrent syncope x2, one of them occurred  while standing and one occurred while sitting. She had no significant  residual and had no injuries with this.   She does have recurrent episodes of lightheadedness that are frequent  that last a couple of seconds. She has a sensation of pressure in her  chest with these and sometimes needs for eructation.   PAST MEDICAL HISTORY:  In addition to the above for:  1. Dyslipidemia.  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  3. Gastroesophageal reflux disease.   I did not take a past surgical history.   REVIEW OF SYSTEMS:  Is noted primarily as above. In addition, she has  had some problems with her back and otherwise is broadly negative across  multiple organ systems.   SOCIAL HISTORY:  She is a disabled Geophysical data processor. She has three  children, four grandchildren. She does not use cigarettes, alcohol or  recreational drugs.   CURRENT MEDICATIONS:  1. Benazepril 40.  2. Prevacid 30.  3. Zocor 40.  4. Hydrochlorothiazide  12.5.  5. Coreg 25 b.i.d.   ALLERGIES:  No known drug allergies.   PHYSICAL EXAMINATION:  She is an elderly [place] American female  appearing her stated age. Her blood pressure is 133/67, pulse 75. Weight  was 223 pounds.  HEENT: Demonstrated no icterus or xanthoma.  The neck veins were at 7-8 cm. The carotids are brisk and full  bilaterally without bruits.  BACK: Is without kyphosis, scoliosis.  LUNGS:  Were clear.  HEART: Sounds were regular without murmurs. There was not a gallop. The  PMI was displaced however.  ABDOMEN: Soft with active bowel sounds without midline pulsation or  hepatomegaly.  Femoral pulses were 2+, distal pulses were intact. There is no clubbing,  cyanosis or edema.  NEUROLOGIC: Grossly normal.  SKIN: Warm and dry.   ELECTROCARDIOGRAM: Obtained on October 30th, demonstrated sinus rhythm  at intervals of 0.17/0.10/0.40.   IMPRESSION:  1. Non-ischemic cardiomyopathy. Confirmed by Myoview scanning in the      last month with ejection fraction of about 30%.  2. 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  3. Narrow QRS.  4. Hypertension.   Aaron Edelman, [name] [name] clearly meets indications for ICD implantation  based on the __________ trial. I am impressed, however, at her  functional impairment; however, device therapy is currently not  indicated for improvement in this, but hopefully calcium modulation or  CRT will  subsequently be proven to be beneficial.   The other question I had is whether you thought she might benefit from  [facility] referral and I will discuss this with  her nurse.   As it relates to her ICD, I discussed this with her and her daughter.  The discussed the potential benefits as well as potential risks  including, but not limited to death, perforation, infection, lead  dislodgement and malfunction. She understands these risks and would like  to proceed.   This is tentatively scheduled for next week.   Thank you very  much for this consultation.    Sincerely,      Deboraha Sprang, MD, [facility]  Electronically Signed    SCK/MedQ  DD: 06/19/2007  DT: 06/19/2007  Job #: [id number]

## 2010-11-23 NOTE — Assessment & Plan Note (Signed)
[place] OFFICE NOTE   NAME:[name], [name] [facility]                      MRN:          [id number]  DATE:09/25/2007                            DOB:          May 19, 1941    [name] [name] is status post ICD implantation for primary prevention in  the setting of nonischemic cardiomyopathy.   She had a significant amount of discomfort over the incision site (see  below).  This has not changed appreciably in the last couple of weeks.   She had an episode in church also where she felt like her heart was  pounding, and nurses took it at the hospital and recorded heart rates  about 140.  This turned out to be similar to the sensation generated by  biventricular pacing and threshold testing.   Otherwise, [name] [name] has no major complaints.  Medications are  unchanged.  They include:  1. Coreg 25 b.i.d.  2. Hydrochlorothiazide 12.5.  3. Benazepril 40.  4. Zocor 40.   On examination, her blood pressure is 130/80 with pulse of 84.  Her lungs were clear.  Her heart sounds were regular.  The skin over the incision was really quite tender, and the incision  itself is retracted to about a length of an inch and a half as opposed  to the 2 to 2-1/2 inches that was likely its initial length.  She has  also noticed this.  EXTREMITIES:  Were without edema.   Interrogation of Medtronic ICD demonstrates a R-wave of 16.2 with  impedance of 504, threshold of 1 volt at 0.3, battery voltage of 3.23.  The device was reprogrammed to create a monitoring zone from 130 beats  per minute.   IMPRESSION:  1. Nonischemic cardiomyopathy.  2. Status post ICD for #1.  3. Significant amount of chest wall discomfort related to the above      accompanied by incision retraction.   We will plan to see [name] [name] again in three months' time.  She is  let us know how it is that she is feeling with her skin.    Deboraha Sprang, MD, [facility]  Electronically Signed   SCK/MedQ  DD: 09/25/2007  DT: 09/25/2007  Job #: [id number]

## 2010-11-23 NOTE — Assessment & Plan Note (Signed)
[place]                                 ON-CALL NOTE   NAME:[name] [name], [name] [name]                        MRN:          [id number]  DATE:09/23/2007                            DOB:          09-19-1940    PRIMARY CARDIOLOGIST:  Dr. Kirk Ruths.   [name] is a 70 year old female with a history of nonischemic  cardiomyopathy as well as paroxysmal atrial tachycardia who had an ICD  placed in December 2008.   [name] called because she was at church today and when she went up  for prayer she felt like she might have gotten a little bit too close to  the microphone and had felt her heart speed up and pound.  Her  defibrillator did not fire.  She did not have chest pain.  Because she  could feel the palpitations although she was not presyncopal, she got  herself checked and she stated there was a nurse at the church who took  her downstairs and was checking her heart rate and blood pressure.  She  said the nurse was telling her heart rate was elevated and thought the  nurse said it was 110 or 120 but could not remember exactly.  She stated  that after about 30 minutes the palpitations eased off and currently she  was at home and checking her heart rate and it was 86.  She is not short  of breath.  She was concerned about the palpitations.   I advised Ms. Man that we were aware she had atrial tachycardia.  I  reassured her that if she had any kind of significant arrhythmia her  defibrillator or the pacemaker part of it would have taken care of it.  As she did not get shocked, I felt that her symptoms were most likely  her atrial tachycardia.  She had not had any over-the-counter  medications and does not drink caffeinated beverages.  There is no  unusual exertion or stress and I was able to reassure her that she has  done nothing to bring this on.  I advised her to keep taking her  medications as prescribed and she is to write down episodes of  this and  bring them to see Dr. Caryl Comes with whom she has an appointment this week.  If she has further symptoms or has episodes of syncope or presyncope,  chest pain or shortness of breath, she understands that she is to call  [phone] and come directly to the [facility] emergency room.      Rosaria Ferries, PA-C  Electronically Signed      Ernestine Mcmurray, MD,FACC  Electronically Signed   RB/MedQ  DD: 09/23/2007  DT: 09/24/2007  Job #: [id number]

## 2010-11-23 NOTE — Discharge Summary (Signed)
NAMEJAHLIYAH, KUBICK               ACCOUNT NO.:  [id number]   MEDICAL RECORD NO.:  [id number]          PATIENT TYPE:  INP   LOCATION:  6525                         FACILITY:  [facility]   PHYSICIAN:  Deboraha Sprang, MD, FACCDATE OF BIRTH:  July 19, 1940   DATE OF ADMISSION:  06/25/2007  DATE OF DISCHARGE:                               DISCHARGE SUMMARY   ADDENDUM   This concerns not the basic metabolic panel - as I thought - but the BNP  that was ordered for this morning.  It was 71.  I was looking for a  basic metabolic panel and it turned out that we needed a brain  natriuretic peptide study, which was 71.      [name], [place]      Deboraha Sprang, MD, [facility]  Electronically Signed    GM/MEDQ  D:  06/26/2007  T:  06/26/2007  Job:  [id number]   cc:   Heinz Knuckles. Norins, MD  Denice Bors. Stanford Breed, MD, [facility]

## 2010-11-23 NOTE — Assessment & Plan Note (Signed)
[place] OFFICE NOTE   NAME:[name] [name], [name] [name]                      MRN:          [id number]  DATE:08/08/2007                            DOB:          1941/02/22    [name] [name] is very pleasant female has a history of nonischemic  cardiomyopathy.  We recently performed follow-up echocardiogram.  This  was on May 18, 2007.  Her ejection fraction was 20-30%.  There was  mild mitral regurgitation.  She also had her last Myoview on May 18, 2007.  Ejection fraction was 34%.  There was probable apical and mild  degree of ischemia at the apex cannot be excluded.  We subsequently  referred her to Dr. Caryl Comes for ICD placement.  She had an ICD placed on  June 25, 2007.  Since discharge she has done well.  She continues  have some dyspnea on exertion although she states it has improved.  There is no orthopnea, PND or pedal edema.  She is not had further chest  pain and there is no syncope.  She occasionally feels a heart flutter.  Note, we did perform pulmonary function tests due to persistent dyspnea.  These were performed on July 16, 2007 and showed normal spirometry  flows.  Defusion was mildly reduced.   MEDICATIONS:  Include Zocor 20 mg p.o. daily, Coreg 25 mg p.o. b.i.d.,  hydrochlorothiazide 12.5 mg daily, potassium 2 p.o. daily, nabumetone,  Prevacid, benazepril 40 mg daily.   PHYSICAL EXAM:  Today shows a blood pressure that is elevated at 160/86  and pulse of 76.  HEENT:  Normal.  NECK:  Supple.  CHEST:  Clear.  CARDIOVASCULAR:  Regular rhythm.  Her ICD site showed no hematoma and  there was no evidence infection.  There is a small suture that was  removed today.  ABDOMEN:  Exam shows no tenderness.  EXTREMITIES:  Show no edema.   Electrocardiogram shows sinus rhythm at a rate of 76.  There is left  ventricle hypertrophy and nonspecific ST changes noted.   DIAGNOSES:  1. Nonischemic  cardiomyopathy - she will continue on present      medications.  Note, her blood pressure is elevated and she does      have dyspnea.  We will add spironolactone 12.5 mg daily.  I will      check a BUN 1 week to follow potassium and renal function.  2. Hypertension - we will add a low-dose spironolactone see if this      improves her blood pressure is well.  We could consider adding      Norvasc in the future as needed.  3. Status post ICD.  4. Persistent dyspnea - etiology of this unclear me.  Her pulmonary      function tests are unremarkable.  She also had a BNP checked      previously that was almost normal at 102.  5. History of atrophic right kidney a small left kidney.  6. History of paroxysmal atrial tachycardia event recorder - she will      continue  on a beta blocker.  7. History of atypical chest pain - recent Myoview showed low risk.      We will continue medical therapy.  She had a catheterization      previously showed normal coronary arteries.   We will see Mrs. Castleman back in approximately 6 months.     Denice Bors Stanford Breed, MD, [facility]  Electronically Signed    BSC/MedQ  DD: 08/08/2007  DT: 08/08/2007  Job #: [id number]

## 2010-11-23 NOTE — Discharge Summary (Signed)
[name] [name], [name] [name]               ACCOUNT NO.:  [id number]   MEDICAL RECORD NO.:  [id number]          PATIENT TYPE:  INP   LOCATION:  [id number]                         FACILITY:  [facility]   PHYSICIAN:  Deboraha Sprang, MD, FACCDATE OF BIRTH:  1941-03-11   DATE OF ADMISSION:  06/25/2007  DATE OF DISCHARGE:  06/26/2007                               DISCHARGE SUMMARY   This patient has known no known drug allergies.   Time for the discharge and examination greater than 35 minutes.   FINAL DIAGNOSIS:  Discharging day #1 status post implantation of  Medtronic MAXIMO VR single-chamber cardioverter-defibrillator with  defibrillator threshold study less than or equal to 20 joules.   SECONDARY DIAGNOSES:  1. Nonischemic cardiomyopathy.      a.     Myoview study May 18, 2007:  Ejection fraction 34%;       probable apical thinning; cannot exclude mild ischemia.      b.     Echocardiogram May 18, 2007:  Ejection fraction 20-30%.      c.     Left heart catheterization August 2004:  Ejection fraction       28%.  Coronaries are angiographically normal.  2. 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  The patient has three-pillow orthopnea.  3. Recurrent syncope x2.  4. Narrow QRS.  5. Dyslipidemia.  6. Gastroesophageal reflux disease.   PROCEDURE:  June 25, 2007:  Implant Medtronic single-chamber  cardioverter-defibrillator, Dr. [name].  The patient had no  postprocedural complications.   BRIEF HISTORY:  [name] [name] is a 70 year old female.  She has a history  of nonischemic cardiomyopathy.  This was identified at left heart  catheterization in 2004.  Ejection fraction at that time was 28%.  Repeat evaluations of her left ventricular function have not changed.  She still remains in the 25-35% ejection fraction range.   The patient has developed significant congestive heart failure symptoms.  She is short of breath at less than a  flight of stairs and at less than  200 feet.  She has no peripheral edema but she has nocturnal dyspnea and  orthopnea on three pillows.   The patient also has a history of recurrent syncope.  She has had two  episodes.  One occurred while standing, one occurred while sitting.  She  had no injuries with this.   She does have recurrent episodes of lightheadedness.  These are  frequent.  They last a couple of seconds.  She has a sensation of  pressure in her chest with these and sometimes needs to belch.  Impression is a nonischemic cardiomyopathy and class II-III congestive  heart failure.  Scanning shows that she has one atrophic kidney and one  which is small.   The patient meets indications for cardioverter-defibrillator  implantation based on the DEFINITE trial.  The patient does have marked  functional impairment.  Device therapy, however, probably will not  improve on this.  May be subsequent CRT could prove beneficial.  The  patient also perhaps might benefit from  congestive heart failure clinic  referral.   The patient wishes to proceed with cardioverter-defibrillator  implantation.  The risks, benefits, expectations have been discussed  with the patient and they wished to proceed.   HOSPITAL COURSE:  The patient presented electively on June 25, 2007.  She underwent implantation of the device as mentioned above the same  day.  She has had no postprocedural complications except she was  nauseated on taking codeine with an empty stomach the morning of  postprocedure day #1.  She had had codeine through the day after her  surgery on December 15 without complication.  Her symptoms got better  with Zofran and Phenergan as well as GI cocktail.  The patient will go  home with something for pain and the admonition to keep her incision dry  for the next 7 days and to sponge bathe until Monday, December 22.  Mobility of the left arm has been discussed.  The device has been   interrogated.  It is functioning normally, no episodes noted.  All  electrical parameters are within normal limits.  Chest x-ray shows that  the lead is an appropriate position.   The patient discharges on the following medications:  1. Coreg 25 mg twice daily.  2. Hydrochlorothiazide 12.5 mg daily.  3. Zocor 40 mg daily at night.  4. Prevacid 30 mg daily.  5. Benazepril 40 mg daily.  6. Aldactone 25 mg daily - anew medication.   She has followup:  1. Pulmonary function tests are at [facility] on [facility]      across from [place] Friday, December 19 at 3 p.m.  2. Incision check at the [facility], [place], [address] Wednesday, December 31 at 9:40 a.m.  3. She will see Dr. Stanford [name] Wednesday January 21 at 11:30 a.m.  4. To see Dr. Caryl Comes September 27, 2007, at 9:15 a.m.   The patient will be given a prescription for Darvocet-N 100 one to two  tablets every 4-6 hours as needed for pain.   As regards laboratory studies and followup, basic metabolic panel which  was scheduled for the morning of December 16 is not showing up on the  computer.  Labs drawn in anticipation of this procedure were taken  June 18, 2007.  Complete blood count:  White cells 7.6, hemoglobin  12.3, hematocrit 37, platelets of 234.  The pro time is 11.4, INR 0.9.  Sodium 137, potassium 4.9, chloride 102, carbonate 30, glucose 95, BUN  is 10, creatinine is 1.  The GFR is 72.  We will try to get this BMP  before discharge.      [name] [name], [place]      Deboraha Sprang, MD, [facility]  Electronically Signed    GM/MEDQ  D:  06/26/2007  T:  06/26/2007  Job:  [id number]   cc:   [name] Bors. Stanford Breed, MD, [facility]  [name] Knuckles. Norins, MD

## 2010-11-23 NOTE — Discharge Summary (Signed)
[name] [name], [name] [name]               ACCOUNT NO.:  [id number]   MEDICAL RECORD NO.:  [id number]          PATIENT TYPE:  INP   LOCATION:  [id number]                         FACILITY:  [facility]   PHYSICIAN:  Deboraha Sprang, MD, FACCDATE OF BIRTH:  1940-10-06   DATE OF ADMISSION:  06/25/2007  DATE OF DISCHARGE:  06/26/2007                               DISCHARGE SUMMARY   DISCHARGE ADDENDUM:  This concerns medication list.  The patient had  been on hydrochlorothiazide and had been hypokalemic.  Nurse had called  in prescription for potassium tablets which the patient says she takes.  This is not on her current MAR, nor is it on her current office list.  However, she says she is taking potassium supplementation.  Her  laboratory studies on December 8 prior to this admission, potassium was  4.9.  We were going to give her Aldactone 25 mg daily, but since she is  taking potassium now and seems as if her potassium is in within range,  we are not going to give her Aldactone at the present time.  She is  asked to bring in her bottle of potassium to the visit with Dr. Stanford Breed  to confirm.  It may be Aldactone will be added at that time, but I think  that potassium supplementation will do fine, so we are holding off on  Aldactone.  The patient goes home with a prescription for Darvocet-N 100  one to two tablets every 4-6 hours.      [name], [place]      Deboraha Sprang, MD, [facility]  Electronically Signed    GM/MEDQ  D:  06/26/2007  T:  06/26/2007  Job:  [id number]   cc:   Heinz Knuckles. Norins, MD  Denice Bors. Stanford Breed, MD, [facility]  Deboraha Sprang, MD, [facility]

## 2010-11-23 NOTE — Assessment & Plan Note (Signed)
[facility] OFFICE NOTE   NAME:Pense, MARCHELLE HUISH                      MRN:          [id number]  DATE:01/09/2008                            DOB:          May 02, 1941    [name] is a very pleasant 70 year old female who has a history of  nonischemic cardiomyopathy.  Her ejection fraction in November 2008 was  20-30% with mild mitral regurgitation by echocardiogram.  Most recent  Myoview in November 2008 showed an ejection fraction of 34%.  There was  mild apical thinning and a mild degree of ischemia cannot be excluded.  Flow risk.  We have treated her medically.  She had an ICD placed on  June 25, 2007.  Since then she has some dyspnea on exertion, but she  states she has been diagnosed with asthma and Advair has helped.  She  continues to have pain at the incision site from her previous ICD  placement.  However, she has not had either chest pain, palpitations or  syncope and there is no pedal edema.   MEDICATIONS:  1. Coreg 25 mg p.o. b.i.d.  2. Hydrochlorothiazide 12.5 mg daily.  3. Benazepril 4 mg daily.  4. Advair Diskus.   PHYSICAL EXAMINATION:  VITALS:  Blood pressure of 98/59 and the pulse  was 64.  She weighs 212 pounds.  HEENT:  Normal.  NECK:  Supple.  CHEST:  Clear.  CARDIOVASCULAR:  Regular rhythm.  There is mild tenderness over the ICD  site, but there is no erythema or warmth.  ABDOMEN:  No tenderness.  EXTREMITIES:  No edema.   Her electrocardiogram shows a sinus rhythm at a rate of 67.  There is  left ventricle hypertrophy.  There is inferolateral T-wave inversion.   DIAGNOSES:  1. Nonischemic cardiomyopathy - she will continue on her Coreg and ACE      inhibitor.  2. Hypertension - blood pressure controlled on present medications.  I      will check BMET, followup test, and renal function.  3. Status post Implantable cardioverter-defibrillator - management per      Dr. Caryl Comes.  4. Asthma.  5. 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  6. 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  She will      continue with her beta-blocker.   We will see her back in 12 months.      Denice Bors Stanford Breed, MD, [facility]  Electronically [place]. Stanford Breed, MD, [facility]  Electronically Signed   BSC/MedQ  DD: 01/09/2008  DT: 01/10/2008  Job #: [id number]

## 2010-11-23 NOTE — Assessment & Plan Note (Signed)
[facility] OFFICE NOTE   NAME:[name] [name], [name] [name]                      MRN:          [id number]  DATE:05/10/2007                            DOB:          1940/07/29    [name] [name] is a very pleasant female who has a history of nonischemic  cardiomyopathy and atypical chest pain.  Note, a cardiac catheterization  performed in August of 2004 showed normal coronary arteries and an  ejection fraction of 28%.  Her most recent echocardiogram was performed  on September 07, 2005.  Her ejection fraction is 35% to 40%.  Since she  was last seen she does have dyspnea on exertion, but there is no  orthopnea, PND or pedal edema.  She continues to have occasional chest  pain.  It is described as a stabbing sensation in the substernal area  without radiation.  It is not pleuritic or positional, nor is it  exertional.  It lasts for 1-2 minutes and resolves spontaneously.   MEDICATIONS:  1. Zocor 20 mg p.o. daily.  2. Coreg 25 mg p.o. b.i.d.  3. Hydrochlorothiazide 12.5 mg p.o. daily.  4. Nabumetone.  5. Hydrocodone.  6. Prevacid.  7. Lisinopril 20 mg p.o. daily.   PHYSICAL EXAMINATION:  Blood pressure of 138/86, and her pulse was 74.  She weighs 226 pounds.  HEENT:  Normal  NECK:  Supple.  CHEST:  Clear.  CARDIOVASCULAR EXAM:  Reveals a regular rate and rhythm.  ABDOMINAL EXAM:  No tenderness.  EXTREMITIES:  Show no edema.   DIAGNOSES:  1. Atypical chest pain:  We will plan to schedule a Myoview for risk      stratification.  Note, her coronaries in 2004 were normal.  Also      note that her electrocardiogram today shows a normal sinus rhythm      at a rate of 74 with left ventricular hypertrophy and nonspecific      ST changes.  If it shows no ischemia, then we will not proceed with      further ischemia evaluation.  2. Nonischemic cardiomyopathy:  We will increase her Lisinopril to 40      mg p.o. daily and continue  with her Coreg.  I will check a BMET in      1 week to follow her potassium and renal function.  At that time,      we will also check lipids and liver, as well as a BNP.  3. History of paroxysmal atrial tachycardia on event monitor.  4. 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  5. Hypertension:  We are increasing her lisinopril.  I will see her      back in 6 months.  Note, we      will also check an echocardiogram to requantify her left      ventricular function at the time of her Myoview.     Denice Bors Stanford Breed, MD, [facility]  Electronically Signed    BSC/MedQ  DD: 05/10/2007  DT: 05/10/2007  Job #: [id number]

## 2010-11-23 NOTE — Assessment & Plan Note (Signed)
[facility] OFFICE NOTE   NAME:Engelken, [name] [name]                      MRN:          [id number]  DATE:05/07/2008                            DOB:          Sep 10, 1940    [name] comes in today for followup for her device.  She is  concerned about tenderness over her pocket.  Her defibrillator pocket  incision has retracted from about 2-1/2 inches to about an inch and  there is a modest keloid there.  It is very tender to touch.  There is  no surrounding tenderness or erythema.   I told her that I would touch the case with some dermatologist to see if  there is anything that we can do to minimize the symptoms associated  with this keloid.   She will be seen otherwise as scheduled.     Deboraha Sprang, MD, [facility]  Electronically Signed    SCK/MedQ  DD: 05/07/2008  DT: 05/08/2008  Job #: [id number]

## 2010-11-26 NOTE — Assessment & Plan Note (Signed)
[facility] OFFICE NOTE   NAME:[name], [name] OLMO                      MRN:          [id number]  DATE:05/02/2006                            DOB:          June 08, 1941    [name] [name] is a very pleasant female who has a history of nonischemic  cardiomyopathy.  Since I last saw her she has dyspnea with more extreme  exertion but not with routine activities.  There is no orthopnea, PND, pedal  edema, palpitations, or syncope.  She has had occasional chest pain and left  arm pain.  This pain is not exertional, nor is it pleuritic or positional.  It lasts anywhere from seconds to 5 minutes.  It resolves spontaneously.   Her medications include:  1. Aspirin 81 mg p.o. daily.  2. Altace 5 mg p.o. b.i.d.  3. Coreg 25 mg p.o. b.i.d.  4. Hydrochlorothiazide 12.5 mg p.o. daily.  5. Nabumetone.   PHYSICAL EXAMINATION TODAY:  VITAL SIGNS:  Shows blood pressure is 120/72  and her pulse is 60.  NECK:  Supple.  CHEST:  Clear.  CARDIOVASCULAR:  Reveals a regular rate and rhythm.  EXTREMITIES:  Show no edema.   Her electrocardiogram shows a sinus rhythm at a rate of 65.  There is left  ventricular hypertrophy but no significant ST changes are noted.   DIAGNOSES:  1. Nonischemic cardiomyopathy.  2. History of hypertension.  3. 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  4. History of paroxysmal atrial tachycardia on event recorder.  5. Atypical chest pain.   PLAN:  [name] [name] is doing well from a symptomatic standpoint.  She is  complaining of vague chest pain but this did not sound cardiac.  I offered a  stress test today but we have elected to continue to follow this and only  pursue further evaluation if it worsens.  I note her previous  catheterization showed normal coronary arteries.  We will discontinue her  aspirin today.  For financial reasons, we will discontinue her Altace after  her present  prescription expires and then begin lisinopril 20 mg p.o. daily.  Also, given the recent data showing a mortality benefit with  statins and nonischemic cardiomyopathy, we will begin Zocor 20 mg p.o.  q.h.s.  In 6 weeks we will check lipids, a CMET and a BNP.  We will see her  back in approximately 6 months.    ______________________________  Denice Bors. Stanford Breed, MD, [facility]    BSC/MedQ  DD: 05/02/2006  DT: 05/03/2006  Job #: [id number]

## 2010-11-26 NOTE — Assessment & Plan Note (Signed)
[name] [name]                            CARDIOLOGY OFFICE NOTE   NAME:[name] [name], [name] [name]                      MRN:          [id number]  DATE:07/19/2006                            DOB:          September 10, 1940    CARDIOLOGIST:  Dr. Denice Bors. [name].   PRIMARY CARE PHYSICIAN:  Dr. Heinz Knuckles. [name].   HISTORY OF PRESENT ILLNESS:  [name] [name] is a 70 year old female  patient followed by Dr. Stanford [name] with a history of nonischemic  cardiomyopathy and normal coronary arteries at catheterization August  2004 who presents to the office today for a heart failure action trial  followup. The patient notes symptoms of chest pain. She has seen Dr.  Stanford [name] for this before. The last time he saw her in October 2007, he  offered a stress test but he and the patient decided to continue to  follow and only pursue further evaluation if it worsened. In  interviewing the patient today, she notes a worse episode of chest pain  about 2 weeks ago. She awoke from sleep with epigastric and substernal  pressure. This lasted about 3-5 minutes. She had some sublingual  nitroglycerin which she took. She was mildly short of breath with this.  She was somewhat diaphoretic. She felt some discomfort up in her right  jaw as well as down bilateral arms. After the pain subsided, she felt  well for the rest of the night. She was somewhat scared and did not  sleep. Since that time, she has felt well without any recurrent symptoms  or fatigue or shortness of breath. She still occasionally gets some  chest pressure which she has described to Dr. Stanford [name] in the past and  this is essentially unchanged.   CURRENT MEDICATIONS:  1. Altace 5 mg twice a day.  2. Coreg 25 mg twice a day.  3. HCTZ 12.5 mg daily.  4. Nabumetone 750 mg as needed.  5. Omeprazole 20 mg daily.  6. Zocor 20 mg daily.   ALLERGIES:  No known drug allergies.   SOCIAL HISTORY:  She denies any tobacco abuse.   REVIEW OF  SYSTEMS:  Please see HPI. She denies any cough. She denies any  belching. She denies any melena or hematochezia. The rest of the review  of systems are negative.   PHYSICAL EXAMINATION:  GENERAL:  She is a well-developed, well-nourished  female.  VITAL SIGNS:  Blood pressure 133/82, pulse 75, weight 220 pounds.  HEENT:  Head normocephalic, atraumatic. Eyes PERRLA, EOMI, sclera clear.  NECK:  Without JVD.  LYMPH:  Without lymphadenopathy.  CAROTIDS:  Without bruits bilaterally.  CARDIAC:  Normal S1, S2. Regular rate and rhythm without murmurs.  LUNGS:  Clear to auscultation bilaterally without wheezes, rhonchi or  rales.  ABDOMEN:  Soft, nontender with normal active bowel sounds. No  organomegaly.  EXTREMITIES:  Without edema. Calves are soft nontender.  SKIN:  Warm and dry.  NEUROLOGIC:  She is alert and oriented x3. Cranial nerves II-XII grossly  intact.   Electrocardiogram  reveals sinus rhythm with a heart rate of 68, normal  axis, inferior  T wave changes consistent with previous tracings without  change. Voltage criteria for LVH.   IMPRESSION:  1. Atypical chest pain.  2. Nonischemic cardiomyopathy with an ejection fraction of 35-40%.  3. Normal coronary arteries by catheterization August 2004.  4. History of paroxysmal atrial tachycardia on event recorder.  5. 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  6. Heart failure action trial.  7. Gastroesophageal reflux disease.   PLAN:  The patient has chronic systolic congestive heart failure with a  nonischemic cardiomyopathy and presents to the office today for a heart  failure action trial followup. She exhibits New York Heart Association  class 2 congestive heart failure symptoms. In interviewing her today for  her research followup, she notes history of chest pain. She had a  worsening episode of chest pain approximately 2 weeks ago. This was  short in nature and seems to be somewhat atypical for ischemia. She  does  have a history of normal coronaries by catheterization. Since that chest  pain subsided, she has actually felt well over the last couple of weeks.  There is no significant change in the electrocardiogram. I recommended  that we go ahead and proceed with a stress Myoview study. She is  agreeable to this. She will undergo stress Myoview and followup with Dr.  Stanford [name] in the next couple of weeks.      [name] Dopp, PA-C  Electronically Signed      [name] Bjornstad, MD, [facility]  Electronically Signed   SW/MedQ  DD: 07/19/2006  DT: 07/19/2006  Job #: [id number]   cc:   [name] Knuckles. Norins, MD

## 2010-11-26 NOTE — Op Note (Signed)
[facility]  Patient:    [name] [name], [name] [name]                   MRN: [id number] Proc. Date: 02/13/01 Adm. Date:  [id number] Attending:  Hortencia Pilar CC:         Heinz Knuckles. Norins, M.D. [facility]  ___________________   Operative Report  PROCEDURE:  Cystoscopy, bilateral retrograde pyelograms with interpretation and hydrodistention of the bladder.  PREOPERATIVE DIAGNOSES:  Hematuria, urinary urgency, and right pelvic kidney.  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  SURGEON:  Marshall Cork. Jeffie Pollock, M.D.  ANESTHESIA:  General.  COMPLICATIONS:  None.  INDICATIONS:  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  She has had the onset over the last few months of some urinary urgency, nocturia, and small voids and was found to have hematuria on office evaluation.  It was felt that cystoscopy and retrogrades were indicated to further evaluate her collecting system and since she had a pelvic kidney, it was felt that IVP would not allow good visualization of the collecting system of the pelvic kidney.  A CT scan had been performed which revealed no abnormalities other than the pelvic kidney.  FINDINGS AT PROCEDURE:  The patient was taken to the operating room where a general anesthetic was induced.  She had received PO Tequin.  She was placed in lithotomy position.  Her perineum and genitalia were prepped with Betadine solution.  She was draped in the usual sterile fashion.  Cystoscopy was performed using a 22 [place] scope and the 12 and 70 degree lenses. Examination revealed a normal urethra.  There was some squamous metaplasia of the trigone with one small trigonal cyst.  No tumors or stones were noted.  No obvious inflammation was seen.  The ureteral orifices were in their  normal anatomic position, effluxing clear urine.  The right ureteral orifice was cannulated with the 5 French open-end catheter, and contrast was instilled in a retrograde fashion.  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  The left ureter was then cannulated; contrast was instilled, and the left ureter was found to be normal.  The internal collecting system was also unremarkable without filling defects or other abnormalities.  At the completion of the retrograde pyelogram, I filled the patients bladder to capacity under approximately 80 cm of water pressure.  Her capacity under anesthesia was only about 350 cc. However, after drainage from the bladder, there were only minimal petechial hemorrhages, primarily on the right lateral wall of the bladder.  A re-distention of the bladder revealed a similar volume.  After completion of the procedure, the bladder was drained, and the patient was taken down from lithotomy position.  Her anesthetic was reversed.  She was admitted to the recovery room in stable condition.  There were no complications.  IMPRESSION:  Microhematuria in a patient with a pelvic kidney and urinary urgency with a small capacity bladder, possible interstitial cystitis.  After completion of the procedure, the bladder was drained, and the patient was taken down from lithotomy position.  Her anesthetic was reversed.  She was admitted to the recovery room in stable condition.  There were no complications. DD:  02/13/01 TD:  02/13/01 Job: [id number]

## 2010-12-29 ENCOUNTER — Encounter: Payer: Self-pay | Admitting: Internal Medicine

## 2010-12-29 ENCOUNTER — Other Ambulatory Visit (INDEPENDENT_AMBULATORY_CARE_PROVIDER_SITE_OTHER): Payer: Medicare Other

## 2010-12-29 ENCOUNTER — Ambulatory Visit (INDEPENDENT_AMBULATORY_CARE_PROVIDER_SITE_OTHER): Payer: Medicare Other | Admitting: Internal Medicine

## 2010-12-29 ENCOUNTER — Other Ambulatory Visit: Payer: Self-pay | Admitting: Internal Medicine

## 2010-12-29 VITALS — BP 122/60 | HR 73 | Temp 98.9°F | Ht 67.0 in | Wt 208.1 lb

## 2010-12-29 DIAGNOSIS — H6990 Unspecified Eustachian tube disorder, unspecified ear: Secondary | ICD-10-CM

## 2010-12-29 DIAGNOSIS — R310 Gross hematuria: Secondary | ICD-10-CM

## 2010-12-29 DIAGNOSIS — J019 Acute sinusitis, unspecified: Secondary | ICD-10-CM

## 2010-12-29 DIAGNOSIS — Z0000 Encounter for general adult medical examination without abnormal findings: Secondary | ICD-10-CM

## 2010-12-29 DIAGNOSIS — Z0001 Encounter for general adult medical examination with abnormal findings: Secondary | ICD-10-CM | POA: Insufficient documentation

## 2010-12-29 DIAGNOSIS — Z79899 Other long term (current) drug therapy: Secondary | ICD-10-CM

## 2010-12-29 LAB — HEPATIC FUNCTION PANEL
AST: 18 U/L (ref 0–37)
Alkaline Phosphatase: 55 U/L (ref 39–117)
Bilirubin, Direct: 0.1 mg/dL (ref 0.0–0.3)
Total Bilirubin: 0.4 mg/dL (ref 0.3–1.2)

## 2010-12-29 LAB — BASIC METABOLIC PANEL
BUN: 14 mg/dL (ref 6–23)
CO2: 28 mEq/L (ref 19–32)
Calcium: 9.1 mg/dL (ref 8.4–10.5)
Glucose, Bld: 87 mg/dL (ref 70–99)
Sodium: 140 mEq/L (ref 135–145)

## 2010-12-29 LAB — URINALYSIS, ROUTINE W REFLEX MICROSCOPIC
Bilirubin Urine: NEGATIVE
Nitrite: NEGATIVE
Urobilinogen, UA: 0.2 (ref 0.0–1.0)
pH: 6 (ref 5.0–8.0)

## 2010-12-29 LAB — LIPID PANEL: HDL: 58.6 mg/dL (ref >39.00)

## 2010-12-29 LAB — CBC WITH DIFFERENTIAL/PLATELET
Basophils Absolute: 0 10*3/uL (ref 0.0–0.1)
Lymphocytes Relative: 50.2 % — ABNORMAL HIGH (ref 12.0–46.0)
Lymphs Abs: 3.5 10*3/uL (ref 0.7–4.0)
Monocytes Relative: 7.2 % (ref 3.0–12.0)
Neutrophils Relative %: 40 % — ABNORMAL LOW (ref 43.0–77.0)
Platelets: 195 10*3/uL (ref 150.0–400.0)
RDW: 15.5 % — ABNORMAL HIGH (ref 11.5–14.6)
WBC: 7.1 10*3/uL (ref 4.5–10.5)

## 2010-12-29 LAB — LDL CHOLESTEROL, DIRECT: Direct LDL: 140 mg/dL

## 2010-12-29 LAB — TSH: TSH: 0.59 u[IU]/mL (ref 0.35–5.50)

## 2010-12-29 MED ORDER — FLUTICASONE PROPIONATE 50 MCG/ACT NA SUSP: 2.0000 | Freq: Every day | NASAL | Status: DC

## 2010-12-29 MED ORDER — FLUTICASONE-SALMETEROL 250-50 MCG/DOSE IN AEPB: 1.0000 | INHALATION_SPRAY | Freq: Two times a day (BID) | RESPIRATORY_TRACT | Status: DC

## 2010-12-29 MED ORDER — CARVEDILOL 25 MG PO TABS: 25.0000 mg | ORAL_TABLET | Freq: Two times a day (BID) | ORAL | Status: DC

## 2010-12-29 MED ORDER — ESOMEPRAZOLE MAGNESIUM 40 MG PO CPDR: 40.0000 mg | DELAYED_RELEASE_CAPSULE | Freq: Every day | ORAL | Status: DC

## 2010-12-29 MED ORDER — HYDROCODONE-ACETAMINOPHEN 5-500 MG PO TABS: 1.0000 | ORAL_TABLET | ORAL | Status: DC | PRN

## 2010-12-29 MED ORDER — SPIRONOLACTONE 25 MG PO TABS: ORAL_TABLET | ORAL | Status: DC

## 2010-12-29 MED ORDER — DICLOFENAC SODIUM 75 MG PO TBEC: 75.0000 mg | DELAYED_RELEASE_TABLET | Freq: Two times a day (BID) | ORAL | Status: DC

## 2010-12-29 MED ORDER — FUROSEMIDE 40 MG PO TABS: ORAL_TABLET | ORAL | Status: DC

## 2010-12-29 MED ORDER — ESTRADIOL 1 MG PO TABS: 1.0000 mg | ORAL_TABLET | Freq: Every day | ORAL | Status: DC

## 2010-12-29 MED ORDER — BENAZEPRIL HCL 40 MG PO TABS: 40.0000 mg | ORAL_TABLET | Freq: Every day | ORAL | Status: DC

## 2010-12-29 MED ORDER — LEVOFLOXACIN 250 MG PO TABS: 250.0000 mg | ORAL_TABLET | Freq: Every day | ORAL | Status: DC

## 2010-12-29 MED ORDER — CETIRIZINE HCL 10 MG PO TABS: 10.0000 mg | ORAL_TABLET | Freq: Every day | ORAL | Status: DC

## 2010-12-29 NOTE — Assessment & Plan Note (Signed)
Painless twice in the past 4 days, not on anticoagulant or antiplt, to check urine cx, I rec'd referral to Dr Wrenn/urology she has seen before but she declines

## 2010-12-29 NOTE — Assessment & Plan Note (Signed)
Also for mucinex otc prn,  to f/u any worsening symptoms or concerns  

## 2010-12-29 NOTE — Assessment & Plan Note (Signed)
Overall doing well, age appropriate education and counseling updated, referrals for preventative services and immunizations addressed, dietary and smoking counseling addressed, most recent labs and ECG reviewed.  I have personally reviewed and have noted: 1) the patient's medical and social history 2) The pt's use of alcohol, tobacco, and illicit drugs 3) The patient's current medications and supplements 4) Functional ability including ADL's, fall risk, home safety risk, hearing and visual impairment 5) Diet and physical activities 6) Evidence for depression or mood disorder 7) The patient's height, weight, and BMI have been recorded in the chart I have made referrals, and provided counseling and education based on review of the above Declines immunizations, but will go ahead with screening colonoscopy

## 2010-12-29 NOTE — Patient Instructions (Addendum)
Take all new medications as prescribed You can also take Mucinex (or it's generic off brand) for congestion and ear pain Please go to LAB in the Basement for the blood and/or urine tests to be done today Please call the phone number [phone] (the [place]) for results of testing in 2-3 days;  When calling, simply dial the number, and when prompted enter the MRN number above (the Medical Record Number) and the # key, then the message should start. You will be contacted regarding the referral for: colonoscopy All of your medications were sent to the [facility] except for the hydrocodone (done hardcopy) Continue all other medications as before Please return in 1 year for your yearly visit, or sooner if needed, with Lab testing done 3-5 days before

## 2010-12-29 NOTE — Assessment & Plan Note (Signed)
Mild to mod, for antibx course,  to f/u any worsening symptoms or concerns 

## 2010-12-29 NOTE — Progress Notes (Signed)
Subjective:    Patient ID: [name] [name], female    DOB: 17-Jan-1941, 70 y.o.   MRN: [id number]  HPI Here for wellness and f/u;  Overall doing ok;  Pt denies CP, worsening SOB, DOE, wheezing, orthopnea, PND, worsening LE edema, palpitations, dizziness or syncope.  Pt denies neurological change such as new Headache, facial or extremity weakness.  Pt denies polydipsia, polyuria, or low sugar symptoms. Pt states overall good compliance with treatment and medications, good tolerability, and trying to follow lower cholesterol diet.  Pt denies worsening depressive symptoms, suicidal ideation or panic. No fever, wt loss, night sweats, loss of appetite, or other constitutional symptoms.  Pt states good ability with ADL's, low fall risk, home safety reviewed and adequate, no significant changes in hearing or vision, and occasionally active with exercise.   Here with c/o left ear pain for almost 2 wks, seemed to start after a neightbor mowed the yard , and she was outside exposed, experienced significant congestion, and even fever one day, assoc with mild dizziness and ? Nausea from that.   Also c/o notice of gross blood in urine twice in the past 4 days (none other times), seemed large amounts to her when occurred, no clots, but o/w  Denies urinary symptoms such as dysuria, frequency, urgency, abd pain, back pain, vomiting though has had some nausea, though she occas has some nausea with a pain pill when she does not eat with it.   Past Medical History  Diagnosis Date  . ABSCESS, AXILLA, LEFT 01/27/2010  . ALLERGIC CONJUNCTIVITIS 09/29/2009  . ALLERGIC RHINITIS 11/06/2007  . 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 ANEMIA-NOS 06/17/2008  . ANXIETY 02/22/2008  . CARDIOMYOPATHY, PRIMARY, DILATED 12/30/2008  . CELLULITIS, LEG, LEFT 06/17/2008  . CHEST PAIN 02/22/2008  . [place] DISEASE, 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  . EAR PAIN, LEFT 02/02/2009  . GERD 05/07/2007  . HEMATURIA UNSPECIFIED 02/22/2008  .  HYPERLIPIDEMIA 05/23/2007  . HYPERTENSION 05/07/2007  . HYSTERECTOMY, HX OF 05/07/2007  . 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 LAMINECTOMY, HX OF 05/07/2007  . MENOPAUSAL DISORDER 05/06/2008  . 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 OVERACTIVE BLADDER 08/03/2010  . PYELONEPHRITIS, HX OF 05/07/2007  . SINUSITIS- ACUTE-NOS 08/03/2010  . SYSTOLIC HEART FAILURE, CHRONIC 12/28/2008  . 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 Unspecified eustachian tube disorder 08/03/2010  . VITAMIN D DEFICIENCY 06/17/2008   Past Surgical History  Procedure Date  . Laminectomy   . Abdominal hysterectomy     reports that she has never smoked. She does not have any smokeless tobacco history on file. She reports that she does not drink alcohol or use illicit drugs. family history includes Arthritis in her other and Coronary artery disease in her other. No Known Allergies Current Outpatient Prescriptions on File Prior to Visit  Medication Sig Dispense Refill  . Cholecalciferol (VITAMIN D3) 1000 UNITS CAPS Take by mouth daily.        . diphenhydrAMINE (BENADRYL) 25 MG tablet as needed.        [name] DISCONTD: benazepril (LOTENSIN) 40 MG tablet Take 40 mg by mouth daily.        [name] DISCONTD: carvedilol (COREG) 25 MG tablet Take 25 mg by mouth 2 (two) times daily.        [name] DISCONTD: cetirizine (ZYRTEC) 10 MG tablet Take 10 mg by mouth daily.        [name] DISCONTD: diclofenac (VOLTAREN) 75 MG EC tablet Take 75 mg by mouth 2 (two) times daily.        [name]  DISCONTD: esomeprazole (NEXIUM) 40 MG capsule Take 40 mg by mouth daily.        [name] DISCONTD: estradiol (ESTRACE) 1 MG tablet Take 1 mg by mouth daily.        [name] DISCONTD: fluticasone (FLONASE) 50 MCG/ACT nasal spray Place 2 sprays into the nose daily.        [name] DISCONTD: Fluticasone-Salmeterol (ADVAIR DISKUS) 250-50 MCG/DOSE AEPB Inhale 1 puff into the lungs 2 (two) times daily.        [name] DISCONTD: furosemide (LASIX) 40 MG tablet 40 mg. 1/2 to 1 by mouth once daily       .  DISCONTD: HYDROcodone-acetaminophen (VICODIN) 5-500 MG per tablet Take 1 tablet by mouth every 4 (four) hours as needed for pain.  120 tablet  2  . DISCONTD: spironolactone (ALDACTONE) 25 MG tablet 1/2 by mouth once daily       . DISCONTD: levofloxacin (LEVAQUIN) 250 MG tablet Take 250 mg by mouth daily.        [name] DISCONTD: mometasone (NASONEX) 50 MCG/ACT nasal spray Place 2 sprays into the nose daily.        [name] DISCONTD: oxybutynin (DITROPAN-XL) 5 MG 24 hr tablet Take 5 mg by mouth 2 (two) times daily.         Review of Systems Review of Systems  Constitutional: Negative for diaphoresis, activity change, appetite change and unexpected weight change.  HENT: Negative for hearing loss, facial swelling, mouth sores and neck stiffness.   Eyes: Negative for pain, redness and visual disturbance.  Respiratory: Negative for shortness of breath and wheezing.   Cardiovascular: Negative for chest pain and palpitations.  Gastrointestinal: Negative for diarrhea, blood in stool, abdominal distention and rectal pain.  Genitourinary: Negative for flank pain and decreased urine volume.  Musculoskeletal: Negative for myalgias and joint swelling.  Skin: Negative for color change and wound.  Neurological: Negative for syncope and numbness.  Hematological: Negative for adenopathy.  Psychiatric/Behavioral: Negative for hallucinations, self-injury, decreased concentration and agitation.      Objective:   Physical Exam BP 122/60  Pulse 73  Temp(Src) 98.9 F (37.2 C) (Oral)  Ht 5\' 7"  (1.702 m)  Wt 208 lb 2 oz (94.405 kg)  BMI 32.60 kg/m2  SpO2 97% Physical Exam  VS noted, mild ill  Constitutional: Pt appears well-developed and well-nourished.  HENT: Head: Normocephalic.  Right Ear: External ear normal.  Left Ear: External ear normal.  Bilat tm's erythema, left severe, right minor.  Sinus tender  bilat.  Pharynx mild erythema Eyes: Conjunctivae and EOM are normal. Pupils are equal, round, and reactive to  light.  Neck: Normal range of motion. Neck supple.  Cardiovascular: Normal rate and regular rhythm.   Pulmonary/Chest: Effort normal and breath sounds normal.  Neurological: Pt is alert. No cranial nerve deficit.  Skin: Skin is warm. No erythema.  Psychiatric: Pt behavior is normal. Thought content normal.         Assessment & Plan:

## 2010-12-31 LAB — URINE CULTURE: Organism ID, Bacteria: NO GROWTH

## 2011-01-26 ENCOUNTER — Ambulatory Visit: Payer: Medicare Other | Admitting: Internal Medicine

## 2011-02-28 ENCOUNTER — Ambulatory Visit: Payer: Medicare Other | Admitting: Internal Medicine

## 2011-02-28 ENCOUNTER — Other Ambulatory Visit: Payer: Self-pay | Admitting: Internal Medicine

## 2011-03-10 ENCOUNTER — Other Ambulatory Visit (INDEPENDENT_AMBULATORY_CARE_PROVIDER_SITE_OTHER): Payer: Medicare Other

## 2011-03-10 ENCOUNTER — Ambulatory Visit (INDEPENDENT_AMBULATORY_CARE_PROVIDER_SITE_OTHER): Payer: Medicare Other | Admitting: Internal Medicine

## 2011-03-10 ENCOUNTER — Encounter: Payer: Self-pay | Admitting: Internal Medicine

## 2011-03-10 VITALS — BP 100/62 | HR 68 | Temp 98.5°F | Ht 67.0 in | Wt 205.5 lb

## 2011-03-10 DIAGNOSIS — H6692 Otitis media, unspecified, left ear: Secondary | ICD-10-CM

## 2011-03-10 DIAGNOSIS — H6690 Otitis media, unspecified, unspecified ear: Secondary | ICD-10-CM

## 2011-03-10 DIAGNOSIS — D509 Iron deficiency anemia, unspecified: Secondary | ICD-10-CM

## 2011-03-10 DIAGNOSIS — Z0000 Encounter for general adult medical examination without abnormal findings: Secondary | ICD-10-CM

## 2011-03-10 DIAGNOSIS — I10 Essential (primary) hypertension: Secondary | ICD-10-CM

## 2011-03-10 DIAGNOSIS — E785 Hyperlipidemia, unspecified: Secondary | ICD-10-CM

## 2011-03-10 LAB — CBC WITH DIFFERENTIAL/PLATELET
Basophils Absolute: 0.1 10*3/uL (ref 0.0–0.1)
Basophils Relative: 0.8 % (ref 0.0–3.0)
Eosinophils Absolute: 0.1 10*3/uL (ref 0.0–0.7)
Hemoglobin: 10.6 g/dL — ABNORMAL LOW (ref 12.0–15.0)
Lymphocytes Relative: 51 % — ABNORMAL HIGH (ref 12.0–46.0)
MCHC: 32.5 g/dL (ref 30.0–36.0)
MCV: 84 fl (ref 78.0–100.0)
Monocytes Absolute: 0.4 10*3/uL (ref 0.1–1.0)
Neutro Abs: 2.6 10*3/uL (ref 1.4–7.7)
Neutrophils Relative %: 39.4 % — ABNORMAL LOW (ref 43.0–77.0)
RBC: 3.89 Mil/uL (ref 3.87–5.11)
RDW: 15.4 % — ABNORMAL HIGH (ref 11.5–14.6)

## 2011-03-10 LAB — IBC PANEL: Saturation Ratios: 23.3 % (ref 20.0–50.0)

## 2011-03-10 MED ORDER — CEPHALEXIN 500 MG PO CAPS: 500.0000 mg | ORAL_CAPSULE | Freq: Four times a day (QID) | ORAL | Status: DC

## 2011-03-10 MED ORDER — BENAZEPRIL HCL 40 MG PO TABS: 40.0000 mg | ORAL_TABLET | Freq: Every day | ORAL | Status: DC

## 2011-03-10 NOTE — Assessment & Plan Note (Signed)
Moderate , for antibx course,  to f/u any worsening symptoms or concerns

## 2011-03-10 NOTE — Assessment & Plan Note (Signed)
Has upcoming colonoscopy, also with recent mild anemia, will need to re-check hgb, iron - if low may need further eval such as EGD

## 2011-03-10 NOTE — Progress Notes (Signed)
Subjective:    Patient ID: [name] [name], female    DOB: 1940-12-24, 70 y.o.   MRN: [id number]  HPI   Here with 3 days acute onset fever, left ear pain, pressure, general weakness and malaise,  with slight hearing loss, but little to no cough, no n/v, vertigo or falls and Pt denies chest pain, increased sob or doe, wheezing, orthopnea, PND, increased LE swelling, palpitations, dizziness or syncope.  Pt denies new neurological symptoms such as new headache, or facial or extremity weakness or numbness   Pt denies polydipsia, polyuria,   Pt states overall good compliance with meds, trying to follow lower cholesterol diet, wt overall stable but little exercise however.   Pt denies fever, wt loss, night sweats, loss of appetite, or other constitutional symptoms, except for the above.  No overt  Bleeding or bruising.   Past Medical History  Diagnosis Date  . ABSCESS, AXILLA, LEFT 01/27/2010  . ALLERGIC CONJUNCTIVITIS 09/29/2009  . ALLERGIC RHINITIS 11/06/2007  . 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 ANEMIA-NOS 06/17/2008  . ANXIETY 02/22/2008  . CARDIOMYOPATHY, PRIMARY, DILATED 12/30/2008  . CELLULITIS, LEG, LEFT 06/17/2008  . CHEST PAIN 02/22/2008  . [place] DISEASE, 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  . EAR PAIN, LEFT 02/02/2009  . GERD 05/07/2007  . HEMATURIA UNSPECIFIED 02/22/2008  . HYPERLIPIDEMIA 05/23/2007  . HYPERTENSION 05/07/2007  . HYSTERECTOMY, HX OF 05/07/2007  . 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 LAMINECTOMY, HX OF 05/07/2007  . MENOPAUSAL DISORDER 05/06/2008  . 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 OVERACTIVE BLADDER 08/03/2010  . PYELONEPHRITIS, HX OF 05/07/2007  . SINUSITIS- ACUTE-NOS 08/03/2010  . SYSTOLIC HEART FAILURE, CHRONIC 12/28/2008  . 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 Unspecified eustachian tube disorder 08/03/2010  . VITAMIN D DEFICIENCY 06/17/2008   Past Surgical History  Procedure Date  . Laminectomy   . Abdominal hysterectomy     reports that she has  never smoked. She does not have any smokeless tobacco history on file. She reports that she does not drink alcohol or use illicit drugs. family history includes Arthritis in her other and Coronary artery disease in her other. Allergies  Allergen Reactions  . Zocor (Simvastatin - High Dose) Nausea Only   Current Outpatient Prescriptions on File Prior to Visit  Medication Sig Dispense Refill  . carvedilol (COREG) 25 MG tablet Take 1 tablet (25 mg total) by mouth 2 (two) times daily.  180 tablet  3  . cetirizine (ZYRTEC) 10 MG tablet Take 1 tablet (10 mg total) by mouth daily.  90 tablet  3  . Cholecalciferol (VITAMIN D3) 1000 UNITS CAPS Take by mouth daily.        . diclofenac (VOLTAREN) 75 MG EC tablet Take 1 tablet (75 mg total) by mouth 2 (two) times daily.  180 tablet  3  . diphenhydrAMINE (BENADRYL) 25 MG tablet as needed.        [name] esomeprazole (NEXIUM) 40 MG capsule Take 1 capsule (40 mg total) by mouth daily.  90 capsule  3  . estradiol (ESTRACE) 1 MG tablet Take 1 tablet (1 mg total) by mouth daily.  90 tablet  3  . fluticasone (FLONASE) 50 MCG/ACT nasal spray Place 2 sprays into the nose daily.  3 Act  3  . Fluticasone-Salmeterol (ADVAIR DISKUS) 250-50 MCG/DOSE AEPB Inhale 1 puff into the lungs 2 (two) times daily.  3 each  3  . furosemide (LASIX) 40 MG tablet 1/2 to 1 by mouth once daily  90 tablet  3  . HYDROcodone-acetaminophen (VICODIN) 5-500 MG per tablet Take 1 tablet by mouth every 4 (four) hours as needed for pain.  120 tablet  2  . spironolactone (ALDACTONE) 25 MG tablet 1/2 by mouth once daily  45 tablet  3   Review of Systems Review of Systems  Constitutional: Negative for diaphoresis and unexpected weight change.  HENT: Negative for drooling and tinnitus.   Eyes: Negative for photophobia and visual disturbance.  Respiratory: Negative for choking and stridor.   Gastrointestinal: Negative for vomiting and blood in stool.  Genitourinary: Negative for hematuria and  decreased urine volume.  Musculoskeletal: Negative for gait problem.  Skin: Negative for color change and wound.  Neurological: Negative for tremors and numbness.  Psychiatric/Behavioral: Negative for decreased concentration. The patient is not hyperactive.       Objective:   Physical Exam BP 100/62  Pulse 68  Temp(Src) 98.5 F (36.9 C) (Oral)  Ht 5\' 7"  (1.702 m)  Wt 205 lb 8 oz (93.214 kg)  BMI 32.19 kg/m2  SpO2 96%  Breastfeeding? Unknown Physical Exam  VS noted Constitutional: Pt appears well-developed and well-nourished.  HENT: Head: Normocephalic.  Right Ear: External ear normal.   Right TM normal appearance, without redness or swelling Left Ear: External ear normal. left TM severe erythema, mild bulging Eyes: Conjunctivae and EOM are normal. Pupils are equal, round, and reactive to light.  Neck: Normal range of motion. Neck supple.  Cardiovascular: Normal rate and regular rhythm.   Pulmonary/Chest: Effort normal and breath sounds normal.  Abd:  Soft, NT, non-distended, + BS Neurological: Pt is alert. No cranial nerve deficit.  Skin: Skin is warm. No erythema.  Psychiatric: Pt behavior is normal. Thought content normal.         Assessment & Plan:

## 2011-03-10 NOTE — Patient Instructions (Addendum)
Take all new medications as prescribed Continue all other medications as before Please go to LAB in the [facility]ement for the blood and/or urine tests to be done today If you have low iron, you may need to have the EGD (stomach endoscopy) done at the same time as the colonoscopy Continue all other medications as before Please return in 6 mo with Lab testing done 3-5 days before

## 2011-03-10 NOTE — Assessment & Plan Note (Signed)
stable overall by hx and exam, most recent data reviewed with pt, and pt to continue medical treatment as before  BP Readings from Last 3 Encounters:  03/10/11 100/62  12/29/10 122/60  08/20/10 135/76

## 2011-03-10 NOTE — Assessment & Plan Note (Signed)
D/w pt; has been intolerant of the zocor in the past, did not want to try the lipitor with last labs, and is trying to work diligently on lower chol diet  Lab Results  Component Value Date   LDLCALC 132* 02/02/2009    Goal ldl < 70

## 2011-03-11 ENCOUNTER — Other Ambulatory Visit: Payer: Self-pay

## 2011-03-11 DIAGNOSIS — H6692 Otitis media, unspecified, left ear: Secondary | ICD-10-CM

## 2011-03-11 MED ORDER — CEPHALEXIN 500 MG PO CAPS: 500.0000 mg | ORAL_CAPSULE | Freq: Four times a day (QID) | ORAL | Status: AC

## 2011-03-11 NOTE — Telephone Encounter (Signed)
Pt called requesting ABX be sent to [facility] due to cost at [facility]

## 2011-03-15 ENCOUNTER — Encounter: Payer: Self-pay | Admitting: Gastroenterology

## 2011-04-07 ENCOUNTER — Other Ambulatory Visit: Payer: Self-pay

## 2011-04-07 MED ORDER — HYDROCODONE-ACETAMINOPHEN 5-500 MG PO TABS: 1.0000 | ORAL_TABLET | Freq: Four times a day (QID) | ORAL | Status: DC | PRN

## 2011-04-07 NOTE — Telephone Encounter (Signed)
Done hardcopy to robin  

## 2011-04-08 NOTE — Telephone Encounter (Signed)
Faxed hardcopy to [place] [phone]

## 2011-04-15 LAB — B-NATRIURETIC PEPTIDE (CONVERTED LAB): Pro B Natriuretic peptide (BNP): 71

## 2011-04-26 ENCOUNTER — Encounter: Payer: Medicare Other | Admitting: Internal Medicine

## 2011-06-10 ENCOUNTER — Telehealth: Payer: Self-pay

## 2011-06-10 NOTE — Telephone Encounter (Signed)
The patient called requesting samples of Nexium. Called informed left 6 boxes at front desk for the patient.

## 2011-06-13 ENCOUNTER — Other Ambulatory Visit: Payer: Self-pay | Admitting: Internal Medicine

## 2011-08-01 ENCOUNTER — Other Ambulatory Visit: Payer: Self-pay

## 2011-08-01 MED ORDER — HYDROCODONE-ACETAMINOPHEN 5-500 MG PO TABS: 1.0000 | ORAL_TABLET | Freq: Four times a day (QID) | ORAL | Status: DC | PRN

## 2011-08-01 NOTE — Telephone Encounter (Signed)
Faxed hardcopy to pharmacy. 

## 2011-08-02 ENCOUNTER — Encounter: Payer: Medicare Other | Admitting: Internal Medicine

## 2011-08-09 ENCOUNTER — Encounter: Payer: Self-pay | Admitting: Internal Medicine

## 2011-08-09 ENCOUNTER — Ambulatory Visit (INDEPENDENT_AMBULATORY_CARE_PROVIDER_SITE_OTHER): Payer: Medicare Other | Admitting: Internal Medicine

## 2011-08-09 VITALS — BP 120/84 | HR 80 | Temp 97.0°F | Ht 67.0 in | Wt 204.0 lb

## 2011-08-09 DIAGNOSIS — H6692 Otitis media, unspecified, left ear: Secondary | ICD-10-CM | POA: Insufficient documentation

## 2011-08-09 DIAGNOSIS — J309 Allergic rhinitis, unspecified: Secondary | ICD-10-CM

## 2011-08-09 DIAGNOSIS — H6690 Otitis media, unspecified, unspecified ear: Secondary | ICD-10-CM

## 2011-08-09 DIAGNOSIS — I10 Essential (primary) hypertension: Secondary | ICD-10-CM

## 2011-08-09 MED ORDER — DICLOFENAC SODIUM 75 MG PO TBEC: 75.0000 mg | DELAYED_RELEASE_TABLET | Freq: Two times a day (BID) | ORAL | Status: DC

## 2011-08-09 MED ORDER — CARVEDILOL 25 MG PO TABS: 25.0000 mg | ORAL_TABLET | Freq: Two times a day (BID) | ORAL | Status: DC

## 2011-08-09 MED ORDER — SPIRONOLACTONE 25 MG PO TABS: ORAL_TABLET | ORAL | Status: DC

## 2011-08-09 MED ORDER — ESOMEPRAZOLE MAGNESIUM 40 MG PO CPDR: 40.0000 mg | DELAYED_RELEASE_CAPSULE | Freq: Every day | ORAL | Status: DC

## 2011-08-09 MED ORDER — LEVOFLOXACIN 250 MG PO TABS: 250.0000 mg | ORAL_TABLET | Freq: Every day | ORAL | Status: DC

## 2011-08-09 MED ORDER — PROMETHAZINE HCL 25 MG PO TABS: 25.0000 mg | ORAL_TABLET | Freq: Four times a day (QID) | ORAL | Status: DC | PRN

## 2011-08-09 MED ORDER — FLUTICASONE PROPIONATE 50 MCG/ACT NA SUSP: 2.0000 | Freq: Every day | NASAL | Status: DC

## 2011-08-09 MED ORDER — ESTRADIOL 1 MG PO TABS: 1.0000 mg | ORAL_TABLET | Freq: Every day | ORAL | Status: DC

## 2011-08-09 MED ORDER — LEVOFLOXACIN 250 MG PO TABS: 250.0000 mg | ORAL_TABLET | Freq: Every day | ORAL | Status: AC

## 2011-08-09 MED ORDER — BENAZEPRIL HCL 40 MG PO TABS: 40.0000 mg | ORAL_TABLET | Freq: Every day | ORAL | Status: DC

## 2011-08-09 MED ORDER — FUROSEMIDE 40 MG PO TABS: ORAL_TABLET | ORAL | Status: DC

## 2011-08-09 NOTE — Assessment & Plan Note (Addendum)
Mild to mod, for antibx course,  to f/u any worsening symptoms or concerns, to consider ENT for persistent pain

## 2011-08-09 NOTE — Assessment & Plan Note (Signed)
stable overall by hx and exam, most recent data reviewed with pt, and pt to continue medical treatment as before  BP Readings from Last 3 Encounters:  08/09/11 120/84  03/10/11 100/62  12/29/10 122/60

## 2011-08-09 NOTE — Progress Notes (Signed)
Subjective:    Patient ID: [name] [name], female    DOB: [birth year], 71 y.o.   MRN: [id number]  HPI  Here with acute on chronic recurrent left ear pain for 2-3 months, now with 3 days fever, pain, dizziness,, nausea, HA, general weakness and malaise. Also does have several months ongoing nasal allergy symptoms with clear congestion, itch and sneeze, without fever, pain, ST, cough or wheezing, despite current meds.  Has not seen ENT or allergy in recent past.  Pt denies chest pain, increased sob or doe, wheezing, orthopnea, PND, increased LE swelling, palpitations, dizziness or syncope.  Pt denies new neurological symptoms such as new headache, or facial or extremity weakness or numbness   Pt denies polydipsia, polyuria.  Overall good compliance with treatment, and good medicine tolerability.  Has tried mucinex briefly as well in the past, did not seem to help Past Medical History  Diagnosis Date  . ABSCESS, AXILLA, LEFT 01/27/2010  . ALLERGIC CONJUNCTIVITIS 09/29/2009  . ALLERGIC RHINITIS 11/06/2007  . 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 ANEMIA-NOS 06/17/2008  . ANXIETY 02/22/2008  . CARDIOMYOPATHY, PRIMARY, DILATED 12/30/2008  . CELLULITIS, LEG, LEFT 06/17/2008  . CHEST PAIN 02/22/2008  . [place] DISEASE, 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  . EAR PAIN, LEFT 02/02/2009  . GERD 05/07/2007  . HEMATURIA UNSPECIFIED 02/22/2008  . HYPERLIPIDEMIA 05/23/2007  . HYPERTENSION 05/07/2007  . HYSTERECTOMY, HX OF 05/07/2007  . 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 LAMINECTOMY, HX OF 05/07/2007  . MENOPAUSAL DISORDER 05/06/2008  . 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 OVERACTIVE BLADDER 08/03/2010  . PYELONEPHRITIS, HX OF 05/07/2007  . SINUSITIS- ACUTE-NOS 08/03/2010  . SYSTOLIC HEART FAILURE, CHRONIC 12/28/2008  . 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 Unspecified eustachian tube disorder 08/03/2010  . VITAMIN D DEFICIENCY 06/17/2008   Past Surgical History  Procedure Date  . Laminectomy   .  Abdominal hysterectomy     reports that she has never smoked. She does not have any smokeless tobacco history on file. She reports that she does not drink alcohol or use illicit drugs. family history includes Arthritis in her other and Coronary artery disease in her other. Allergies  Allergen Reactions  . Zocor (Simvastatin - High Dose) Nausea Only   Current Outpatient Prescriptions on File Prior to Visit  Medication Sig Dispense Refill  . Cholecalciferol (VITAMIN D3) 1000 UNITS CAPS Take by mouth daily.        . diphenhydrAMINE (BENADRYL) 25 MG tablet as needed.        [name] HYDROcodone-acetaminophen (VICODIN) 5-500 MG per tablet Take 1 tablet by mouth every 6 (six) hours as needed for pain.  120 tablet  2   Review of Systems Review of Systems  Constitutional: Negative for diaphoresis and unexpected weight change.  HENT: Negative for drooling and tinnitus.   Eyes: Negative for photophobia and visual disturbance.  Respiratory: Negative for choking and stridor.   Gastrointestinal: Negative for vomiting and blood in stool.  Genitourinary: Negative for hematuria and decreased urine volume.    Objective:   Physical Exam BP 120/84  Pulse 80  Temp(Src) 97 F (36.1 C) (Oral)  Ht 5\' 7"  (1.702 m)  Wt 204 lb (92.534 kg)  BMI 31.95 kg/m2  SpO2 97% Physical Exam  VS noted Constitutional: Pt appears well-developed and well-nourished.  HENT: Head: Normocephalic.  Right Ear: External ear normal.  Left Ear: External ear normal.  Left tm's severe erythema.  Sinus nontender.  Pharynx mild erythema, right TM mild erythema, nonbulging Eyes: Conjunctivae  and EOM are normal. Pupils are equal, round, and reactive to light.  Neck: Normal range of motion. Neck supple.  Cardiovascular: Normal rate and regular rhythm.   Pulmonary/Chest: Effort normal and breath sounds normal.  Neurological: Pt is alert. No cranial nerve deficit.  Skin: Skin is warm. No erythema.  Psychiatric: Pt behavior is normal.  Thought content normal.     Assessment & Plan:

## 2011-08-09 NOTE — Assessment & Plan Note (Signed)
Mild uncontrolled, to cont meds, add mucinex and benadryl otc prn, to consider allergy referral

## 2011-08-09 NOTE — Patient Instructions (Signed)
Take all new medications as prescribed (sent to your pharmacy) Continue all other medications as before You can also take Mucinex (or it's generic off brand) for congestion and ear fullness You may wish to see ENT if not improved You are given all refills today Please return in 6 mo with Lab testing done 3-5 days before

## 2011-09-13 ENCOUNTER — Telehealth: Payer: Self-pay | Admitting: *Deleted

## 2011-09-13 MED ORDER — PANTOPRAZOLE SODIUM 40 MG PO TBEC: 40.0000 mg | DELAYED_RELEASE_TABLET | Freq: Every day | ORAL | Status: DC

## 2011-09-13 NOTE — Telephone Encounter (Signed)
Pt is requesting an alternative medication to Nexium. Pt states that Nexium rx is too expensive.

## 2011-09-13 NOTE — Telephone Encounter (Signed)
Ok to try change to protonix generic

## 2011-09-13 NOTE — Telephone Encounter (Signed)
Patient informed of medication change 

## 2011-09-22 ENCOUNTER — Ambulatory Visit: Payer: Medicare Other | Admitting: Cardiology

## 2011-10-06 ENCOUNTER — Ambulatory Visit: Payer: Medicare Other | Admitting: Cardiology

## 2011-10-11 ENCOUNTER — Other Ambulatory Visit: Payer: Self-pay | Admitting: Internal Medicine

## 2011-11-04 ENCOUNTER — Other Ambulatory Visit: Payer: Self-pay

## 2011-11-04 MED ORDER — HYDROCODONE-ACETAMINOPHEN 5-500 MG PO TABS: 1.0000 | ORAL_TABLET | Freq: Four times a day (QID) | ORAL | Status: DC | PRN

## 2011-11-04 NOTE — Telephone Encounter (Signed)
Done hardcopy to robin  

## 2011-11-04 NOTE — Telephone Encounter (Signed)
Faxed hardcopy to pharmacy. 

## 2011-11-07 ENCOUNTER — Ambulatory Visit (INDEPENDENT_AMBULATORY_CARE_PROVIDER_SITE_OTHER): Payer: Medicare Other | Admitting: Cardiology

## 2011-11-07 ENCOUNTER — Encounter: Payer: Self-pay | Admitting: Cardiology

## 2011-11-07 VITALS — BP 105/61 | HR 61 | Ht 67.0 in | Wt 205.0 lb

## 2011-11-07 DIAGNOSIS — I10 Essential (primary) hypertension: Secondary | ICD-10-CM

## 2011-11-07 DIAGNOSIS — I5022 Chronic systolic (congestive) heart failure: Secondary | ICD-10-CM

## 2011-11-07 DIAGNOSIS — Z95810 Presence of automatic (implantable) cardiac defibrillator: Secondary | ICD-10-CM

## 2011-11-07 DIAGNOSIS — I428 Other cardiomyopathies: Secondary | ICD-10-CM

## 2011-11-07 DIAGNOSIS — I471 Supraventricular tachycardia: Secondary | ICD-10-CM

## 2011-11-07 LAB — BASIC METABOLIC PANEL
Calcium: 8.8 mg/dL (ref 8.4–10.5)
Creatinine, Ser: 1.2 mg/dL (ref 0.4–1.2)

## 2011-11-07 NOTE — Assessment & Plan Note (Signed)
Continue present dose of diuretics. Patient euvolemic on examination.

## 2011-11-07 NOTE — Assessment & Plan Note (Signed)
Blood pressure controlled. Continue present medications. 

## 2011-11-07 NOTE — Assessment & Plan Note (Signed)
Continue ACE inhibitor, beta blocker and diuretics. Check potassium and renal function.

## 2011-11-07 NOTE — Patient Instructions (Signed)
Your physician recommends that you have labs drawn today  BMP  We will call you with your results.   Your physician wants you to follow-up in: 1 year with Dr. Stanford Breed. You will receive a reminder letter in the mail two months in advance. If you don't receive a letter, please call our office to schedule the follow-up appointment.

## 2011-11-07 NOTE — Assessment & Plan Note (Signed)
Management per electrophysiology. 

## 2011-11-07 NOTE — Progress Notes (Signed)
HPI: [name] [name] is a very pleasant  female who has a history of nonischemic cardiomyopathy. Most recent Myoview in November 2008 showed an ejection fraction of 34%.  There was mild apical thinning and a mild degree of ischemia cannot be excluded. Low risk.  We have treated her medically.  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 Last echocardiogram in March of 2012 showed an ejection fraction of 25-30%, mild left atrial enlargement and mild mitral regurgitation. I last saw her in Feb 2012. Since then, the patient has dyspnea with more extreme activities but not with routine activities. It is relieved with rest. It is not associated with chest pain. There is no orthopnea, PND or pedal edema. There is no syncope or palpitations. There is no exertional chest pain. An occasional staining pain that last 30-40 seconds. It is substernal without radiation. Not exertional. She has had this intermittently for years.   Current Outpatient Prescriptions  Medication Sig Dispense Refill  . benazepril (LOTENSIN) 40 MG tablet Take 1 tablet (40 mg total) by mouth daily.  90 tablet  3  . carvedilol (COREG) 25 MG tablet Take 1 tablet (25 mg total) by mouth 2 (two) times daily.  180 tablet  3  . Cholecalciferol (VITAMIN D3) 1000 UNITS CAPS Take by mouth daily.        . diclofenac (VOLTAREN) 75 MG EC tablet Take 1 tablet (75 mg total) by mouth 2 (two) times daily.  180 tablet  3  . diphenhydrAMINE (BENADRYL) 25 MG tablet as needed.        [name] estradiol (ESTRACE) 1 MG tablet Take 1 tablet (1 mg total) by mouth daily.  90 tablet  3  . fluticasone (FLONASE) 50 MCG/ACT nasal spray Place 2 sprays into the nose daily.  16 g  5  . furosemide (LASIX) 40 MG tablet 1/2 to 1 by mouth once daily  90 tablet  3  . HYDROcodone-acetaminophen (VICODIN) 5-500 MG per tablet Take 1 tablet by mouth every 6 (six) hours as needed for pain.  120 tablet  2  . pantoprazole (PROTONIX) 40 MG tablet Take 1 tablet (40 mg total) by mouth daily.   90 tablet  3  . spironolactone (ALDACTONE) 25 MG tablet 1/2 by mouth once daily  45 tablet  3     Past Medical History  Diagnosis Date  . ALLERGIC RHINITIS 11/06/2007  . 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 ANEMIA-NOS 06/17/2008  . ANXIETY 02/22/2008  . CARDIOMYOPATHY, PRIMARY, DILATED 12/30/2008  . [place] DISEASE, 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  . GERD 05/07/2007  . HYPERLIPIDEMIA 05/23/2007  . HYPERTENSION 05/07/2007  . 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 MENOPAUSAL DISORDER 05/06/2008  . 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 OVERACTIVE BLADDER 08/03/2010  . PYELONEPHRITIS, HX OF 05/07/2007  . SYSTOLIC HEART FAILURE, CHRONIC 12/28/2008  . VITAMIN D DEFICIENCY 06/17/2008    Past Surgical History  Procedure Date  . Laminectomy   . Abdominal hysterectomy     History   Social History  . Marital Status: Widowed    Spouse Name: N/A    Number of Children: 3  . Years of Education: N/A   Occupational History  . 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 Smoking status: Never Smoker   . Smokeless tobacco: Not on file  . Alcohol Use: No  . Drug Use: No  . Sexually Active: Not on file   Other Topics Concern  . Not on file  Social History Narrative  . No narrative on file    ROS: no fevers or chills, productive cough, hemoptysis, dysphasia, odynophagia, melena, hematochezia, dysuria, hematuria, rash, seizure activity, orthopnea, PND, pedal edema, claudication. Remaining systems are negative.  Physical Exam: Well-developed well-nourished in no acute distress.  Skin is warm and dry.  HEENT is normal.  Neck is supple.  Chest is clear to auscultation with normal expansion.  Cardiovascular exam is regular rate and rhythm.  Abdominal exam nontender or distended. No masses palpated. Extremities show no edema. neuro grossly intact  ECG sinus rhythm at a rate of 61. Left ventricular hypertrophy. Nonspecific ST changes.

## 2011-11-07 NOTE — Assessment & Plan Note (Signed)
Continue beta blocker. 

## 2011-11-08 ENCOUNTER — Telehealth: Payer: Self-pay | Admitting: Cardiology

## 2011-11-08 NOTE — Telephone Encounter (Signed)
Pt rtn call re results

## 2011-11-08 NOTE — Telephone Encounter (Signed)
Left message for pt of normal labs

## 2012-01-02 ENCOUNTER — Other Ambulatory Visit: Payer: Self-pay | Admitting: Internal Medicine

## 2012-01-06 ENCOUNTER — Other Ambulatory Visit: Payer: Self-pay | Admitting: Internal Medicine

## 2012-01-20 ENCOUNTER — Other Ambulatory Visit: Payer: Self-pay | Admitting: Internal Medicine

## 2012-01-30 ENCOUNTER — Encounter: Payer: Medicare Other | Admitting: Cardiology

## 2012-02-01 ENCOUNTER — Encounter: Payer: Medicare Other | Admitting: Cardiology

## 2012-02-01 LAB — HM MAMMOGRAPHY: HM Mammogram: NORMAL

## 2012-02-03 ENCOUNTER — Encounter: Payer: Medicare Other | Admitting: Cardiology

## 2012-02-06 ENCOUNTER — Other Ambulatory Visit (INDEPENDENT_AMBULATORY_CARE_PROVIDER_SITE_OTHER): Payer: Medicare Other

## 2012-02-06 ENCOUNTER — Telehealth: Payer: Self-pay

## 2012-02-06 DIAGNOSIS — Z0000 Encounter for general adult medical examination without abnormal findings: Secondary | ICD-10-CM

## 2012-02-06 DIAGNOSIS — E785 Hyperlipidemia, unspecified: Secondary | ICD-10-CM

## 2012-02-06 LAB — HEPATIC FUNCTION PANEL
ALT: 12 U/L (ref 0–35)
AST: 12 U/L (ref 0–37)
Alkaline Phosphatase: 44 U/L (ref 39–117)
Total Bilirubin: 0.4 mg/dL (ref 0.3–1.2)

## 2012-02-06 LAB — LIPID PANEL
Cholesterol: 201 mg/dL — ABNORMAL HIGH (ref 0–200)
HDL: 64.6 mg/dL (ref >39.00)
Total CHOL/HDL Ratio: 3
Triglycerides: 73 mg/dL (ref 0.0–149.0)
VLDL: 14.6 mg/dL (ref 0.0–40.0)

## 2012-02-06 LAB — BASIC METABOLIC PANEL
BUN: 15 mg/dL (ref 6–23)
Chloride: 108 mEq/L (ref 96–112)
Potassium: 4.7 mEq/L (ref 3.5–5.1)
Sodium: 138 mEq/L (ref 135–145)

## 2012-02-06 LAB — CBC WITH DIFFERENTIAL/PLATELET
Basophils Relative: 0.6 % (ref 0.0–3.0)
Eosinophils Absolute: 0.2 10*3/uL (ref 0.0–0.7)
Eosinophils Relative: 2.7 % (ref 0.0–5.0)
Lymphocytes Relative: 52.1 % — ABNORMAL HIGH (ref 12.0–46.0)
Neutrophils Relative %: 38.8 % — ABNORMAL LOW (ref 43.0–77.0)
RBC: 3.89 Mil/uL (ref 3.87–5.11)
WBC: 5.9 10*3/uL (ref 4.5–10.5)

## 2012-02-06 LAB — URINALYSIS, ROUTINE W REFLEX MICROSCOPIC
Bilirubin Urine: NEGATIVE
Ketones, ur: NEGATIVE
Leukocytes, UA: NEGATIVE
Urine Glucose: NEGATIVE
pH: 5.5 (ref 5.0–8.0)

## 2012-02-06 LAB — TSH: TSH: 0.88 u[IU]/mL (ref 0.35–5.50)

## 2012-02-06 LAB — LDL CHOLESTEROL, DIRECT: Direct LDL: 120.6 mg/dL

## 2012-02-06 NOTE — Telephone Encounter (Signed)
Put order in for physical labs. 

## 2012-02-07 ENCOUNTER — Encounter: Payer: Self-pay | Admitting: Internal Medicine

## 2012-02-07 ENCOUNTER — Ambulatory Visit (INDEPENDENT_AMBULATORY_CARE_PROVIDER_SITE_OTHER): Payer: Medicare Other | Admitting: Internal Medicine

## 2012-02-07 VITALS — BP 110/70 | HR 65 | Temp 97.8°F | Ht 67.0 in | Wt 206.0 lb

## 2012-02-07 DIAGNOSIS — K219 Gastro-esophageal reflux disease without esophagitis: Secondary | ICD-10-CM

## 2012-02-07 DIAGNOSIS — Z0000 Encounter for general adult medical examination without abnormal findings: Secondary | ICD-10-CM

## 2012-02-07 MED ORDER — ESOMEPRAZOLE MAGNESIUM 40 MG PO CPDR: 40.0000 mg | DELAYED_RELEASE_CAPSULE | Freq: Every day | ORAL | Status: DC

## 2012-02-07 MED ORDER — BENAZEPRIL HCL 40 MG PO TABS: 40.0000 mg | ORAL_TABLET | Freq: Every day | ORAL | Status: DC

## 2012-02-07 MED ORDER — FUROSEMIDE 40 MG PO TABS: ORAL_TABLET | ORAL | Status: DC

## 2012-02-07 MED ORDER — FLUTICASONE PROPIONATE 50 MCG/ACT NA SUSP: 2.0000 | Freq: Every day | NASAL | Status: DC

## 2012-02-07 MED ORDER — HYDROCODONE-ACETAMINOPHEN 5-500 MG PO TABS: 1.0000 | ORAL_TABLET | Freq: Four times a day (QID) | ORAL | Status: DC | PRN

## 2012-02-07 MED ORDER — DICLOFENAC SODIUM 75 MG PO TBEC: 75.0000 mg | DELAYED_RELEASE_TABLET | Freq: Two times a day (BID) | ORAL | Status: DC

## 2012-02-07 MED ORDER — SPIRONOLACTONE 25 MG PO TABS: ORAL_TABLET | ORAL | Status: DC

## 2012-02-07 MED ORDER — ESTRADIOL 1 MG PO TABS: 1.0000 mg | ORAL_TABLET | Freq: Every day | ORAL | Status: DC

## 2012-02-07 MED ORDER — CARVEDILOL 25 MG PO TABS: 25.0000 mg | ORAL_TABLET | Freq: Two times a day (BID) | ORAL | Status: DC

## 2012-02-07 NOTE — Assessment & Plan Note (Signed)
Overall doing well, age appropriate education and counseling updated, referrals for preventative services and immunizations addressed, dietary and smoking counseling addressed, most recent labs and ECG reviewed.  I have personally reviewed and have noted: 1) the patient's medical and social history 2) The pt's use of alcohol, tobacco, and illicit drugs 3) The patient's current medications and supplements 4) Functional ability including ADL's, fall risk, home safety risk, hearing and visual impairment 5) Diet and physical activities 6) Evidence for depression or mood disorder 7) The patient's height, weight, and BMI have been recorded in the chart I have made referrals, and provided counseling and education based on review of the above Declines immunizations, also due for colnoscpy

## 2012-02-07 NOTE — Progress Notes (Signed)
Subjective:    Patient ID: [name] [name], female    DOB: Oct 25, 1940, 71 y.o.   MRN: [id number]  HPI  Here for wellness and f/u;  Overall doing ok;  Pt denies CP, worsening SOB, DOE, wheezing, orthopnea, PND, worsening LE edema, palpitations, dizziness or syncope.  Pt denies neurological change such as new Headache, facial or extremity weakness.  Pt denies polydipsia, polyuria, or low sugar symptoms. Pt states overall good compliance with treatment and medications, good tolerability, and trying to follow lower cholesterol diet.  Pt denies worsening depressive symptoms, suicidal ideation or panic. No fever, wt loss, night sweats, loss of appetite, or other constitutional symptoms.  Pt states good ability with ADL's, low fall risk, home safety reviewed and adequate, no significant changes in hearing or vision, and occasionally active with exercise.  Pantoprazole not working so well now,  Wants to go back to nexium. Needs all med refills.  Eating less junk food than before.    More pain recently to lower back and left leg, before was worse on the right leg.  Doesn't want statin for elev chol so far, trying to work on diet Past Medical History  Diagnosis Date  . ALLERGIC RHINITIS 11/06/2007  . 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 ANEMIA-NOS 06/17/2008  . ANXIETY 02/22/2008  . CARDIOMYOPATHY, PRIMARY, DILATED 12/30/2008  . [place] DISEASE, 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  . GERD 05/07/2007  . HYPERLIPIDEMIA 05/23/2007  . HYPERTENSION 05/07/2007  . 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 MENOPAUSAL DISORDER 05/06/2008  . 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 OVERACTIVE BLADDER 08/03/2010  . PYELONEPHRITIS, HX OF 05/07/2007  . SYSTOLIC HEART FAILURE, CHRONIC 12/28/2008  . VITAMIN D DEFICIENCY 06/17/2008   Past Surgical History  Procedure Date  . Laminectomy   . Abdominal hysterectomy     reports that she has never smoked. She does not have any smokeless tobacco history on file. She reports that  she does not drink alcohol or use illicit drugs. family history includes Arthritis in her other and Coronary artery disease in her other. Allergies  Allergen Reactions  . Zocor (Simvastatin - High Dose) Nausea Only   Current Outpatient Prescriptions on File Prior to Visit  Medication Sig Dispense Refill  . Cholecalciferol (VITAMIN D3) 1000 UNITS CAPS Take by mouth daily.        . diphenhydrAMINE (BENADRYL) 25 MG tablet as needed.        [name] DISCONTD: benazepril (LOTENSIN) 40 MG tablet Take 1 tablet (40 mg total) by mouth daily.  90 tablet  3  . DISCONTD: carvedilol (COREG) 25 MG tablet Take 1 tablet (25 mg total) by mouth 2 (two) times daily.  180 tablet  3  . DISCONTD: estradiol (ESTRACE) 1 MG tablet Take 1 tablet (1 mg total) by mouth daily.  90 tablet  3  . DISCONTD: fluticasone (FLONASE) 50 MCG/ACT nasal spray Place 2 sprays into the nose daily.  16 g  5  . DISCONTD: furosemide (LASIX) 40 MG tablet 1/2 to 1 by mouth once daily  90 tablet  3  . DISCONTD: furosemide (LASIX) 40 MG tablet TAKE 1/2 TO 1 TABLET BY MOUTH ONCE DAILY  90 tablet  1  . DISCONTD: spironolactone (ALDACTONE) 25 MG tablet 1/2 by mouth once daily  45 tablet  3  . DISCONTD: spironolactone (ALDACTONE) 25 MG tablet TAKE 1/2 TABLET BY MOUTH ONCE DAILY  45 tablet  2  . esomeprazole (NEXIUM) 40 MG capsule Take 1 capsule (40 mg total) by mouth daily.  90 capsule  3    Review of Systems Review of Systems  Constitutional: Negative for diaphoresis, activity change, appetite change and unexpected weight change.  HENT: Negative for hearing loss, ear pain, facial swelling, mouth sores and neck stiffness.   Eyes: Negative for pain, redness and visual disturbance.  Respiratory: Negative for shortness of breath and wheezing.   Cardiovascular: Negative for chest pain and palpitations.  Gastrointestinal: Negative for diarrhea, blood in stool, abdominal distention and rectal pain.  Genitourinary: Negative for hematuria, flank pain and  decreased urine volume.  Musculoskeletal: Negative for myalgias and joint swelling.  Skin: Negative for color change and wound.  Neurological: Negative for syncope and numbness.  Hematological: Negative for adenopathy.  Psychiatric/Behavioral: Negative for hallucinations, self-injury, decreased concentration and agitation.      Objective:   Physical Exam BP 110/70  Pulse 65  Temp 97.8 F (36.6 C) (Oral)  Ht 5\' 7"  (1.702 m)  Wt 206 lb (93.441 kg)  BMI 32.26 kg/m2  SpO2 98% Physical Exam  VS noted Constitutional: Pt is oriented to person, place, and time. Appears well-developed and well-nourished.  HENT:  Head: Normocephalic and atraumatic.  Right Ear: External ear normal.  Left Ear: External ear normal.  Nose: Nose normal.  Mouth/Throat: Oropharynx is clear and moist.  Eyes: Conjunctivae and EOM are normal. Pupils are equal, round, and reactive to light.  Neck: Normal range of motion. Neck supple. No JVD present. No tracheal deviation present.  Cardiovascular: Normal rate, regular rhythm, normal heart sounds and intact distal pulses.   Pulmonary/Chest: Effort normal and breath sounds normal.  Abdominal: Soft. Bowel sounds are normal. There is no tenderness.  Musculoskeletal: Normal range of motion. Exhibits no edema.  Lymphadenopathy:  Has no cervical adenopathy.  Neurological: Pt is alert and oriented to person, place, and time. Pt has normal reflexes. No cranial nerve deficit.  Skin: Skin is warm and dry. No rash noted.  Psychiatric:  Has  normal mood and affect. Behavior is normal.     Assessment & Plan:

## 2012-02-07 NOTE — Patient Instructions (Addendum)
Please remember to followup with your GYN for the yearly pap smear and/or mammogram You will be contacted regarding the referral for: Gastroenterology OK to stop the pantoprazole Start the nexium 40 mg per day Please continue your efforts at being more active, low cholesterol diet, and weight control. You are given all the hardcopy refills today Please keep your appointments with your specialists as you have planned  - Dr Jonni Sanger for the back and leg pain Please return in 6 months, or sooner if needed

## 2012-02-07 NOTE — Assessment & Plan Note (Signed)
Also with dyspeptic symtpoms on the protonix, ok for change to nexium, also refer GI as may need EGD, in addition to screening colonosocpy

## 2012-02-09 ENCOUNTER — Encounter: Payer: Self-pay | Admitting: Internal Medicine

## 2012-02-09 ENCOUNTER — Ambulatory Visit (INDEPENDENT_AMBULATORY_CARE_PROVIDER_SITE_OTHER): Payer: Medicare Other | Admitting: *Deleted

## 2012-02-09 DIAGNOSIS — I471 Supraventricular tachycardia: Secondary | ICD-10-CM

## 2012-02-09 DIAGNOSIS — Z95810 Presence of automatic (implantable) cardiac defibrillator: Secondary | ICD-10-CM

## 2012-02-09 DIAGNOSIS — I428 Other cardiomyopathies: Secondary | ICD-10-CM

## 2012-02-09 LAB — ICD DEVICE OBSERVATION
BATTERY VOLTAGE: 3.07 V
BRDY-0002RV: 40 {beats}/min
CHARGE TIME: 7.73 s
FVT: 0
RV LEAD IMPEDENCE ICD: 496 Ohm
TZAT-0001FASTVT: 1
TZAT-0001SLOWVT: 1
TZAT-0001SLOWVT: 2
TZAT-0004SLOWVT: 6
TZAT-0004SLOWVT: 8
TZAT-0005FASTVT: 88 pct
TZAT-0005SLOWVT: 88 pct
TZAT-0011FASTVT: 10 ms
TZAT-0013SLOWVT: 2
TZAT-0013SLOWVT: 2
TZAT-0018FASTVT: NEGATIVE
TZAT-0019FASTVT: 8 V
TZAT-0020SLOWVT: 1.6 ms
TZAT-0020SLOWVT: 1.6 ms
TZON-0011AFLUTTER: 70
TZST-0001FASTVT: 2
TZST-0001FASTVT: 3
TZST-0001FASTVT: 6
TZST-0001SLOWVT: 3
TZST-0001SLOWVT: 5
TZST-0003FASTVT: 25 J
TZST-0003FASTVT: 35 J
TZST-0003FASTVT: 35 J
TZST-0003SLOWVT: 25 J
TZST-0003SLOWVT: 35 J
VF: 0

## 2012-02-09 NOTE — Progress Notes (Signed)
icd check in clinic  

## 2012-02-15 ENCOUNTER — Other Ambulatory Visit: Payer: Self-pay | Admitting: Internal Medicine

## 2012-02-17 ENCOUNTER — Encounter: Payer: Self-pay | Admitting: Internal Medicine

## 2012-02-28 ENCOUNTER — Other Ambulatory Visit: Payer: Self-pay | Admitting: Internal Medicine

## 2012-03-05 ENCOUNTER — Other Ambulatory Visit: Payer: Self-pay | Admitting: Internal Medicine

## 2012-03-13 ENCOUNTER — Ambulatory Visit: Payer: Medicare Other | Admitting: Internal Medicine

## 2012-03-13 ENCOUNTER — Other Ambulatory Visit: Payer: Self-pay | Admitting: Internal Medicine

## 2012-03-14 ENCOUNTER — Ambulatory Visit: Payer: Medicare Other | Admitting: Internal Medicine

## 2012-04-10 ENCOUNTER — Encounter: Payer: Medicare Other | Admitting: Internal Medicine

## 2012-04-18 ENCOUNTER — Telehealth: Payer: Self-pay | Admitting: Internal Medicine

## 2012-04-18 NOTE — Telephone Encounter (Signed)
04-18-12 lmm @ 338pm for pt to rs cxl defib ck with klein/mt

## 2012-05-14 ENCOUNTER — Telehealth: Payer: Self-pay | Admitting: Internal Medicine

## 2012-05-14 DIAGNOSIS — H9209 Otalgia, unspecified ear: Secondary | ICD-10-CM

## 2012-05-14 NOTE — Telephone Encounter (Signed)
Patient calling, asking for an ENT referral.  Still having problems with her ear. She can be reached on her cell or home # [phone].

## 2012-05-14 NOTE — Telephone Encounter (Signed)
Done per emr 

## 2012-05-15 ENCOUNTER — Other Ambulatory Visit: Payer: Self-pay | Admitting: Internal Medicine

## 2012-05-15 NOTE — Telephone Encounter (Signed)
Done hardcopy to robin  

## 2012-05-15 NOTE — Telephone Encounter (Signed)
Faxed hardcopy to pharmacy. 

## 2012-07-06 ENCOUNTER — Other Ambulatory Visit: Payer: Self-pay | Admitting: Internal Medicine

## 2012-07-19 ENCOUNTER — Encounter: Payer: Self-pay | Admitting: *Deleted

## 2012-08-10 ENCOUNTER — Encounter: Payer: Self-pay | Admitting: Internal Medicine

## 2012-08-10 ENCOUNTER — Ambulatory Visit (INDEPENDENT_AMBULATORY_CARE_PROVIDER_SITE_OTHER): Payer: Medicare Other | Admitting: Internal Medicine

## 2012-08-10 VITALS — BP 130/72 | HR 79 | Temp 98.2°F | Ht 67.0 in | Wt 209.1 lb

## 2012-08-10 DIAGNOSIS — H9190 Unspecified hearing loss, unspecified ear: Secondary | ICD-10-CM

## 2012-08-10 DIAGNOSIS — H9201 Otalgia, right ear: Secondary | ICD-10-CM

## 2012-08-10 DIAGNOSIS — H9209 Otalgia, unspecified ear: Secondary | ICD-10-CM

## 2012-08-10 DIAGNOSIS — I10 Essential (primary) hypertension: Secondary | ICD-10-CM

## 2012-08-10 DIAGNOSIS — D509 Iron deficiency anemia, unspecified: Secondary | ICD-10-CM

## 2012-08-10 DIAGNOSIS — H9191 Unspecified hearing loss, right ear: Secondary | ICD-10-CM | POA: Insufficient documentation

## 2012-08-10 DIAGNOSIS — E785 Hyperlipidemia, unspecified: Secondary | ICD-10-CM

## 2012-08-10 MED ORDER — ESOMEPRAZOLE MAGNESIUM 40 MG PO CPDR: 40.0000 mg | DELAYED_RELEASE_CAPSULE | Freq: Every day | ORAL | Status: DC

## 2012-08-10 MED ORDER — ESTRADIOL 1 MG PO TABS: 1.0000 mg | ORAL_TABLET | Freq: Every day | ORAL | Status: DC

## 2012-08-10 MED ORDER — SPIRONOLACTONE 25 MG PO TABS: ORAL_TABLET | ORAL | Status: DC

## 2012-08-10 MED ORDER — FLUTICASONE PROPIONATE 50 MCG/ACT NA SUSP: 2.0000 | Freq: Every day | NASAL | Status: DC

## 2012-08-10 MED ORDER — CIPROFLOXACIN-DEXAMETHASONE 0.3-0.1 % OT SUSP: 4.0000 [drp] | Freq: Two times a day (BID) | OTIC | Status: DC

## 2012-08-10 MED ORDER — DICLOFENAC SODIUM 75 MG PO TBEC: 75.0000 mg | DELAYED_RELEASE_TABLET | Freq: Two times a day (BID) | ORAL | Status: DC

## 2012-08-10 MED ORDER — CARVEDILOL 25 MG PO TABS: 25.0000 mg | ORAL_TABLET | Freq: Two times a day (BID) | ORAL | Status: DC

## 2012-08-10 MED ORDER — BENAZEPRIL HCL 40 MG PO TABS: 40.0000 mg | ORAL_TABLET | Freq: Every day | ORAL | Status: DC

## 2012-08-10 MED ORDER — FUROSEMIDE 40 MG PO TABS: ORAL_TABLET | ORAL | Status: DC

## 2012-08-10 NOTE — Patient Instructions (Addendum)
Your right ear was irrigated today Please take all new medication as prescribed - the ciprodex Please continue all other medications as before, and refills have been done if requested. Please have the pharmacy call with any other refills you may need. Please call if you change your mind about having blood work done Please continue your efforts at being more active, low cholesterol diet, and weight control. Please keep your appointments with your specialists as you have planned - cardiology Thank you for enrolling in [facility]. Please follow the instructions below to securely access your online medical record. MyChart allows you to send messages to your doctor, view your test results, renew your prescriptions, schedule appointments, and more. To Log into My Chart online, please go by [employer] or [employer] to [email or web address], or download the MyChart App from the [employer].  Your Username is: [email or web address] ([email or web address]) Please send a practice Message on Mychart later today. Please return in 6 months, or sooner if needed, with Lab testing done 3-5 days before

## 2012-08-10 NOTE — Assessment & Plan Note (Signed)
With some irritation vs infection post irrigation - ok for ciprodex refill

## 2012-08-12 ENCOUNTER — Encounter: Payer: Self-pay | Admitting: Internal Medicine

## 2012-08-12 NOTE — Assessment & Plan Note (Signed)
Improved with irrigation,  to f/u any worsening symptoms or concerns  

## 2012-08-12 NOTE — Assessment & Plan Note (Signed)
stable overall by history and exam, recent data reviewed with pt, and pt to continue medical treatment as before,  to f/u any worsening symptoms or concerns BP Readings from Last 3 Encounters:  08/10/12 130/72  02/07/12 110/70  11/07/11 105/61

## 2012-08-12 NOTE — Assessment & Plan Note (Signed)
D/w pt, I rec'd f;u cbc, declines for now as no recent bleeding or symptoms

## 2012-08-12 NOTE — Assessment & Plan Note (Signed)
Lab Results  Component Value Date   LDLCALC 132* 02/02/2009   I rec'd statin to be started, but she declines, prefers to work on lower chol diet for now, to f/u next visit

## 2012-08-12 NOTE — Progress Notes (Signed)
Subjective:    Patient ID: [name] [name], female    DOB: 04/12/1941, 72 y.o.   MRN: [id number]  HPI  Here to f/u, has right ear pain, swelling and ? D/c for 2-3 days, also some decreased hearing but no dizziness, HA, fever, ST, cough and Pt denies chest pain, increased sob or doe, wheezing, orthopnea, PND, increased LE swelling, palpitations, dizziness or syncope.  Pt denies new neurological symptoms such as new headache, or facial or extremity weakness or numbness   Pt denies polydipsia, polyuria Pt states overall good compliance with meds, trying to follow lower cholesterol diet, wt overall stable.  No overt bleeding or bruising. Past Medical History  Diagnosis Date  . ALLERGIC RHINITIS 11/06/2007  . 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 ANEMIA-NOS 06/17/2008  . ANXIETY 02/22/2008  . CARDIOMYOPATHY, PRIMARY, DILATED 12/30/2008  . [place] DISEASE, 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  . GERD 05/07/2007  . HYPERLIPIDEMIA 05/23/2007  . HYPERTENSION 05/07/2007  . 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 MENOPAUSAL DISORDER 05/06/2008  . 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 OVERACTIVE BLADDER 08/03/2010  . PYELONEPHRITIS, HX OF 05/07/2007  . SYSTOLIC HEART FAILURE, CHRONIC 12/28/2008  . VITAMIN D DEFICIENCY 06/17/2008   Past Surgical History  Procedure Date  . Laminectomy   . Abdominal hysterectomy     reports that she has never smoked. She does not have any smokeless tobacco history on file. She reports that she does not drink alcohol or use illicit drugs. family history includes Arthritis in her other and Coronary artery disease in her other. Allergies  Allergen Reactions  . Zocor (Simvastatin - High Dose) Nausea Only   Current Outpatient Prescriptions on File Prior to Visit  Medication Sig Dispense Refill  . benazepril (LOTENSIN) 40 MG tablet Take 1 tablet (40 mg total) by mouth daily.  90 tablet  3  . carvedilol (COREG) 25 MG tablet TAKE 1 TABLET BY MOUTH TWICE DAILY  180 tablet  3  .  Cholecalciferol (VITAMIN D3) 1000 UNITS CAPS Take by mouth daily.        . diphenhydrAMINE (BENADRYL) 25 MG tablet as needed.        [name] esomeprazole (NEXIUM) 40 MG capsule Take 1 capsule (40 mg total) by mouth daily.  90 capsule  3  . estradiol (ESTRACE) 1 MG tablet Take 1 tablet (1 mg total) by mouth daily.  90 tablet  3  . fluticasone (FLONASE) 50 MCG/ACT nasal spray PLACE 2 SPRAYS INTO THE NOSE DAILY.  16 g  6  . furosemide (LASIX) 40 MG tablet 1/2 to 1 by mouth once daily  90 tablet  3  . HYDROcodone-acetaminophen (VICODIN) 5-500 MG per tablet TAKE 1 TABLET BY MOUTH EVERY 6 HRS AS NEEDED FOR PAIN  120 tablet  2  . spironolactone (ALDACTONE) 25 MG tablet 1/2 by mouth once daily  45 tablet  3   Review of Systems  Constitutional: Negative for unexpected weight change, or unusual diaphoresis  HENT: Negative for tinnitus.   Eyes: Negative for photophobia and visual disturbance.  Respiratory: Negative for choking and stridor.   Gastrointestinal: Negative for vomiting and blood in stool.  Genitourinary: Negative for hematuria and decreased urine volume.  Musculoskeletal: Negative for acute joint swelling Skin: Negative for color change and wound.  Neurological: Negative for tremors and numbness other than noted  Psychiatric/Behavioral: Negative for decreased concentration or  hyperactivity.      Objective:   Physical Exam BP 130/72  Pulse 79  Temp 98.2 F (36.8  C) (Oral)  Ht 5\' 7"  (1.702 m)  Wt 209 lb 2 oz (94.858 kg)  BMI 32.75 kg/m2  SpO2 97% VS noted,  Constitutional: Pt appears well-developed and well-nourished.  HENT: Head: NCAT.  Right Ear: External ear normal.  Left Ear: External ear normal.  Right canal irrigated of wax impaction, mild erythema/swelling/tender of canal noted Bilat tm's without erythema.  Max sinus areas non tender.  Pharynx with mild erythema, no exudate Eyes: Conjunctivae and EOM are normal. Pupils are equal, round, and reactive to light.  Neck: Normal  range of motion. Neck supple.  Cardiovascular: Normal rate and regular rhythm.   Pulmonary/Chest: Effort normal and breath sounds normal.  Neurological: Pt is alert. Not confused  Skin: Skin is warm. No erythema. No LE edema Psychiatric: Pt behavior is normal. Thought content normal.     Assessment & Plan:

## 2012-08-14 ENCOUNTER — Other Ambulatory Visit: Payer: Self-pay | Admitting: Internal Medicine

## 2012-08-14 MED ORDER — HYDROCODONE-ACETAMINOPHEN 5-325 MG PO TABS: 1.0000 | ORAL_TABLET | Freq: Four times a day (QID) | ORAL | Status: DC | PRN

## 2012-08-14 NOTE — Telephone Encounter (Signed)
Done hardcopy to robin  rx changed from the 5/500 to 5/325 due to FDA rules

## 2012-08-14 NOTE — Telephone Encounter (Signed)
Faxed hardcopy to pharmacy. 

## 2012-08-15 ENCOUNTER — Telehealth: Payer: Self-pay | Admitting: Internal Medicine

## 2012-08-15 NOTE — Telephone Encounter (Signed)
Patient was calling to ask for her "old pain medication" the Hydrocodone 5/500.  She states that yesterday she had Hydrocodone 5/325 called in for her and it isn't helping her as much as the other one did.  RN advised patient that the Hydrocodone 5/500s are not being made anymore.  This is the new dosing that has the same amount of hydrocodone but less Tylenol.  Caller verbalized understanding.  RN advised caller that if medication is not helping her she would need to come back to the office to discuss pain management with her doctor.  Pt verbalized understanding.

## 2012-08-25 ENCOUNTER — Other Ambulatory Visit: Payer: Self-pay

## 2012-09-07 ENCOUNTER — Encounter: Payer: Medicare Other | Admitting: Internal Medicine

## 2012-09-17 ENCOUNTER — Other Ambulatory Visit: Payer: Self-pay | Admitting: Internal Medicine

## 2012-09-24 ENCOUNTER — Encounter: Payer: Self-pay | Admitting: Internal Medicine

## 2012-09-24 ENCOUNTER — Ambulatory Visit (INDEPENDENT_AMBULATORY_CARE_PROVIDER_SITE_OTHER): Payer: Medicare Other | Admitting: Internal Medicine

## 2012-09-24 VITALS — BP 142/80 | HR 87 | Temp 98.0°F | Ht 67.0 in | Wt 207.0 lb

## 2012-09-24 DIAGNOSIS — IMO0002 Reserved for concepts with insufficient information to code with codable children: Secondary | ICD-10-CM

## 2012-09-24 DIAGNOSIS — I10 Essential (primary) hypertension: Secondary | ICD-10-CM

## 2012-09-24 DIAGNOSIS — M5416 Radiculopathy, lumbar region: Secondary | ICD-10-CM

## 2012-09-24 DIAGNOSIS — F411 Generalized anxiety disorder: Secondary | ICD-10-CM

## 2012-09-24 MED ORDER — CYCLOBENZAPRINE HCL 5 MG PO TABS: 5.0000 mg | ORAL_TABLET | Freq: Three times a day (TID) | ORAL | Status: DC | PRN

## 2012-09-24 MED ORDER — OXYCODONE HCL 5 MG PO TABA: 1.0000 | ORAL_TABLET | Freq: Four times a day (QID) | ORAL | Status: DC | PRN

## 2012-09-24 MED ORDER — FLUTICASONE-SALMETEROL 250-50 MCG/DOSE IN AEPB: 1.0000 | INHALATION_SPRAY | Freq: Two times a day (BID) | RESPIRATORY_TRACT | Status: DC

## 2012-09-24 MED ORDER — PREDNISONE 10 MG PO TABS: ORAL_TABLET | ORAL | Status: DC

## 2012-09-24 NOTE — Progress Notes (Signed)
Subjective:    Patient ID: [name] [name], female    DOB: January 01, 1941, 72 y.o.   MRN: [id number]  HPI  Here to f/u; overall doing ok,  Pt denies chest pain, increased sob or doe, wheezing, orthopnea, PND, increased LE swelling, palpitations, dizziness or syncope.  Pt denies polydipsia, polyuria, or low sugar symptoms such as weakness or confusion improved with po intake.  Pt denies new neurological symptoms such as new headache, or facial or extremity weakness or numbness.   Pt states overall good compliance with meds, has been trying to follow lower cholesterol diet, with wt overall stable,  but little exercise however. Pt continues to have recurring LBP without change in severity, bowel or bladder change, fever, wt loss,  worsening LE pain/numbness/weakness, gait change or falls.  vicodin 5 not working  Has planned f/u with Dr Jonni Sanger in 2 wks. Denies worsening depressive symptoms, suicidal ideation, or panic Past Medical History  Diagnosis Date  . ALLERGIC RHINITIS 11/06/2007  . 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 ANEMIA-NOS 06/17/2008  . ANXIETY 02/22/2008  . CARDIOMYOPATHY, PRIMARY, DILATED 12/30/2008  . [place] DISEASE, 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  . GERD 05/07/2007  . HYPERLIPIDEMIA 05/23/2007  . HYPERTENSION 05/07/2007  . 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 MENOPAUSAL DISORDER 05/06/2008  . 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 OVERACTIVE BLADDER 08/03/2010  . PYELONEPHRITIS, HX OF 05/07/2007  . SYSTOLIC HEART FAILURE, CHRONIC 12/28/2008  . VITAMIN D DEFICIENCY 06/17/2008   Past Surgical History  Procedure Laterality Date  . Laminectomy    . Abdominal hysterectomy      reports that she has never smoked. She does not have any smokeless tobacco history on file. She reports that she does not drink alcohol or use illicit drugs. family history includes Arthritis in her other and Coronary artery disease in her other. Allergies  Allergen Reactions  . Zocor (Simvastatin -  High Dose) Nausea Only   Current Outpatient Prescriptions on File Prior to Visit  Medication Sig Dispense Refill  . benazepril (LOTENSIN) 40 MG tablet Take 1 tablet (40 mg total) by mouth daily.  90 tablet  3  . carvedilol (COREG) 25 MG tablet Take 1 tablet (25 mg total) by mouth 2 (two) times daily.  180 tablet  3  . Cholecalciferol (VITAMIN D3) 1000 UNITS CAPS Take by mouth daily.        . ciprofloxacin-dexamethasone (CIPRODEX) otic suspension Place 4 drops into the right ear 2 (two) times daily.  7.5 mL  0  . diclofenac (VOLTAREN) 75 MG EC tablet Take 1 tablet (75 mg total) by mouth 2 (two) times daily. As neede for pain  180 tablet  3  . diphenhydrAMINE (BENADRYL) 25 MG tablet as needed.        [name] esomeprazole (NEXIUM) 40 MG capsule Take 1 capsule (40 mg total) by mouth daily.  90 capsule  3  . estradiol (ESTRACE) 1 MG tablet Take 1 tablet (1 mg total) by mouth daily.  90 tablet  3  . fluticasone (FLONASE) 50 MCG/ACT nasal spray Place 2 sprays into the nose daily.  16 g  5  . furosemide (LASIX) 40 MG tablet TAKE 1/2 TO 1 TABLET BY MOUTH ONCE DAILY  90 tablet  3  . spironolactone (ALDACTONE) 25 MG tablet 1/2 by mouth once daily  45 tablet  3   No current facility-administered medications on file prior to visit.    Review of Systems  Constitutional: Negative for unexpected weight change, or unusual diaphoresis  HENT: Negative for tinnitus.   Eyes: Negative for photophobia and visual disturbance.  Respiratory: Negative for choking and stridor.   Gastrointestinal: Negative for vomiting and blood in stool.  Genitourinary: Negative for hematuria and decreased urine volume.  Musculoskeletal: Negative for acute joint swelling Skin: Negative for color change and wound.  Neurological: Negative for tremors and numbness other than noted  Psychiatric/Behavioral: Negative for decreased concentration or  hyperactivity.       Objective:   Physical Exam BP 142/80  Pulse 87  Temp(Src) 98 F  (36.7 C) (Oral)  Ht 5\' 7"  (1.702 m)  Wt 207 lb (93.895 kg)  BMI 32.41 kg/m2  SpO2 97% VS noted,  Constitutional: Pt appears well-developed and well-nourished.  HENT: Head: NCAT.  Right Ear: External ear normal.  Left Ear: External ear normal.  Eyes: Conjunctivae and EOM are normal. Pupils are equal, round, and reactive to light.  Neck: Normal range of motion. Neck supple.  Cardiovascular: Normal rate and regular rhythm.   Pulmonary/Chest: Effort normal and breath sounds normal.  Abd:  Soft, NT, non-distended, + BS Neurological: Pt is alert. Not confused , motor/dtr intact except for RLE 4+/5 distal weakness Skin: Skin is warm. No erythema.  Psychiatric: Pt behavior is normal. Thought content normal. , not nervous or depressed affect Spine nontender , but mod tender over right SI joint area/right buttock,     Assessment & Plan:

## 2012-09-24 NOTE — Assessment & Plan Note (Signed)
With mild neuro change, for pain med, flexeril, predpack, f/u ortho as planned

## 2012-09-24 NOTE — Assessment & Plan Note (Signed)
stable overall by history and exam, recent data reviewed with pt, and pt to continue medical treatment as before,  to f/u any worsening symptoms or concerns Lab Results  Component Value Date   WBC 5.9 02/06/2012   HGB 10.5* 02/06/2012   HCT 32.9* 02/06/2012   PLT 188.0 02/06/2012   GLUCOSE 91 02/06/2012   CHOL 201* 02/06/2012   TRIG 73.0 02/06/2012   HDL 64.60 02/06/2012   LDLDIRECT 120.6 02/06/2012   LDLCALC 132* 02/02/2009   ALT 12 02/06/2012   AST 12 02/06/2012   NA 138 02/06/2012   K 4.7 02/06/2012   CL 108 02/06/2012   CREATININE 1.2 02/06/2012   BUN 15 02/06/2012   CO2 26 02/06/2012   TSH 0.88 02/06/2012   INR 0.9 RATIO 06/18/2007

## 2012-09-24 NOTE — Assessment & Plan Note (Signed)
stable overall by history and exam, recent data reviewed with pt, and pt to continue medical treatment as before,  to f/u any worsening symptoms or concerns BP Readings from Last 3 Encounters:  09/24/12 142/80  08/10/12 130/72  02/07/12 110/70

## 2012-09-24 NOTE — Patient Instructions (Signed)
Please take all new medication as prescribed Please continue all other medications as before, and refills have been done if requested. Your advair was refilled today Please keep your appointments with your specialists as you have planned - Dr Jonni Sanger

## 2012-09-26 ENCOUNTER — Telehealth: Payer: Self-pay | Admitting: Internal Medicine

## 2012-09-26 MED ORDER — TIZANIDINE HCL 4 MG PO TABS: 4.0000 mg | ORAL_TABLET | Freq: Four times a day (QID) | ORAL | Status: DC | PRN

## 2012-09-26 NOTE — Telephone Encounter (Signed)
Caller: Donyelle/Patient; Phone: [phone]; Reason for Call: Patient states she was seen in the office on 09/24/12 and Dr Jenny Reichmann gave Rx for muscle relaxer.  Pt reports her insurance does not cover medication and is to expensive to pay out of pocket.  Pt would like to know if there is something else she can take.  PLEASE F/U WITH PT WITH QUESTIONS.  THANK YOU.

## 2012-09-26 NOTE — Telephone Encounter (Signed)
Ok to try change to tizanidine prn, this more likely to be covered by her ins I hope, done erx

## 2012-09-27 NOTE — Telephone Encounter (Signed)
Patient informed. 

## 2012-10-03 ENCOUNTER — Encounter: Payer: Self-pay | Admitting: Internal Medicine

## 2012-10-22 ENCOUNTER — Telehealth: Payer: Self-pay

## 2012-10-22 NOTE — Telephone Encounter (Signed)
Patient called to inform the Oxycodone is not working for pain.  States she has to take tylenol along with it.  Please advise

## 2012-10-22 NOTE — Telephone Encounter (Signed)
Patient informed. 

## 2012-10-22 NOTE — Telephone Encounter (Signed)
That is certainly ok, as long as she stays at 3000 mg tylenol per day or less, and f/u's with ortho as she mentioned at her last visit

## 2012-10-22 NOTE — Telephone Encounter (Signed)
Called the patient left message to call back 

## 2012-11-06 ENCOUNTER — Ambulatory Visit (INDEPENDENT_AMBULATORY_CARE_PROVIDER_SITE_OTHER): Payer: Medicare Other | Admitting: Cardiology

## 2012-11-06 ENCOUNTER — Encounter: Payer: Self-pay | Admitting: Cardiology

## 2012-11-06 VITALS — BP 130/80 | HR 71 | Ht 67.0 in | Wt 200.0 lb

## 2012-11-06 DIAGNOSIS — I10 Essential (primary) hypertension: Secondary | ICD-10-CM

## 2012-11-06 DIAGNOSIS — Z95810 Presence of automatic (implantable) cardiac defibrillator: Secondary | ICD-10-CM

## 2012-11-06 DIAGNOSIS — I5022 Chronic systolic (congestive) heart failure: Secondary | ICD-10-CM

## 2012-11-06 DIAGNOSIS — I428 Other cardiomyopathies: Secondary | ICD-10-CM

## 2012-11-06 DIAGNOSIS — Z0000 Encounter for general adult medical examination without abnormal findings: Secondary | ICD-10-CM

## 2012-11-06 LAB — BASIC METABOLIC PANEL
BUN: 15 mg/dL (ref 6–23)
Creatinine, Ser: 1.3 mg/dL — ABNORMAL HIGH (ref 0.4–1.2)
GFR: 54 mL/min — ABNORMAL LOW (ref >60.00)

## 2012-11-06 LAB — HEMOGLOBIN A1C: Hgb A1c MFr Bld: 5.9 % (ref 4.6–6.5)

## 2012-11-06 NOTE — Assessment & Plan Note (Signed)
Continue present blood pressure medications. 

## 2012-11-06 NOTE — Progress Notes (Signed)
HPI: [name] [name] is a very pleasant female who has a history of nonischemic cardiomyopathy. Most recent  Myoview in November 2008 showed an ejection fraction of 34%. There was mild apical thinning and a mild degree of ischemia cannot be excluded. Low risk. We have treated her medically. 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 Last echocardiogram in March of 2012 showed an ejection fraction of 25-30%, mild left atrial enlargement and mild mitral regurgitation. I last saw her in April 2013. Since then, the patient has dyspnea with more extreme activities but not with routine activities. It is relieved with rest. It is not associated with chest pain. There is no orthopnea, PND or pedal edema. There is no syncope or palpitations. There is no exertional chest pain.    Current Outpatient Prescriptions  Medication Sig Dispense Refill  . benazepril (LOTENSIN) 40 MG tablet Take 1 tablet (40 mg total) by mouth daily.  90 tablet  3  . carvedilol (COREG) 25 MG tablet Take 1 tablet (25 mg total) by mouth 2 (two) times daily.  180 tablet  3  . Cholecalciferol (VITAMIN D3) 1000 UNITS CAPS Take by mouth daily.        . ciprofloxacin-dexamethasone (CIPRODEX) otic suspension Place 4 drops into the left ear as needed.      . diclofenac (VOLTAREN) 75 MG EC tablet Take 1 tablet (75 mg total) by mouth 2 (two) times daily. As neede for pain  180 tablet  3  . diphenhydrAMINE (BENADRYL) 25 MG tablet as needed.        [name] esomeprazole (NEXIUM) 40 MG capsule Take 1 capsule (40 mg total) by mouth daily.  90 capsule  3  . estradiol (ESTRACE) 1 MG tablet Take 1 tablet (1 mg total) by mouth daily.  90 tablet  3  . fluticasone (FLONASE) 50 MCG/ACT nasal spray Place 2 sprays into the nose daily.  16 g  5  . Fluticasone-Salmeterol (ADVAIR DISKUS) 250-50 MCG/DOSE AEPB Inhale 1 puff into the lungs 2 (two) times daily.  1 each  11  . furosemide (LASIX) 40 MG tablet TAKE 1/2 TO 1 TABLET BY MOUTH ONCE DAILY  90 tablet  3  .  HYDROcodone-acetaminophen (NORCO/VICODIN) 5-325 MG per tablet Take one tab every 5-6 hours      . spironolactone (ALDACTONE) 25 MG tablet 1/2 by mouth once daily  45 tablet  3  . tiZANidine (ZANAFLEX) 4 MG tablet Take 1 tablet (4 mg total) by mouth every 6 (six) hours as needed.  60 tablet  1   No current facility-administered medications for this visit.     Past Medical History  Diagnosis Date  . ALLERGIC RHINITIS 11/06/2007  . 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 ANEMIA-NOS 06/17/2008  . ANXIETY 02/22/2008  . CARDIOMYOPATHY, PRIMARY, DILATED 12/30/2008  . [place] DISEASE, 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  . GERD 05/07/2007  . HYPERLIPIDEMIA 05/23/2007  . HYPERTENSION 05/07/2007  . 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 MENOPAUSAL DISORDER 05/06/2008  . 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 OVERACTIVE BLADDER 08/03/2010  . PYELONEPHRITIS, HX OF 05/07/2007  . SYSTOLIC HEART FAILURE, CHRONIC 12/28/2008  . VITAMIN D DEFICIENCY 06/17/2008    Past Surgical History  Procedure Laterality Date  . Laminectomy    . Abdominal hysterectomy      History   Social History  . Marital Status: Widowed    Spouse Name: N/A    Number of Children: 3  . Years of Education: N/A   Occupational History  .  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 Smoking status: Never Smoker   . Smokeless tobacco: Not on file  . Alcohol Use: No  . Drug Use: No  . Sexually Active: Not on file   Other Topics Concern  . Not on file   Social History Narrative  . No narrative on file    ROS: Back pain but no fevers or chills, productive cough, hemoptysis, dysphasia, odynophagia, melena, hematochezia, dysuria, hematuria, rash, seizure activity, orthopnea, PND, pedal edema, claudication. Remaining systems are negative.  Physical Exam: Well-developed well-nourished in no acute distress.  Skin is warm and dry.  HEENT is normal.  Neck is supple.  Chest is clear to auscultation with normal  expansion.  Cardiovascular exam is regular rate and rhythm.  Abdominal exam nontender or distended. No masses palpated. Extremities show no edema. neuro grossly intact  ECG sinus rhythm at a rate of 71. Left ventricular hypertrophy with repolarization abnormality.

## 2012-11-06 NOTE — Assessment & Plan Note (Signed)
Management per electrophysiology. 

## 2012-11-06 NOTE — Patient Instructions (Addendum)
Your physician wants you to follow-up in: ONE YEAR WITH DR CRENSHAW You will receive a reminder letter in the mail two months in advance. If you don't receive a letter, please call our office to schedule the follow-up appointment.   Your physician recommends that you HAVE LAB WORK TODAY 

## 2012-11-06 NOTE — Assessment & Plan Note (Signed)
Continue ACE inhibitor and beta blocker. 

## 2012-11-06 NOTE — Assessment & Plan Note (Signed)
Check hemoglobin A1c.

## 2012-11-06 NOTE — Assessment & Plan Note (Signed)
Patient euvolemic on examination. Continue present dose of diuretic. Check potassium and renal function.

## 2012-11-09 ENCOUNTER — Other Ambulatory Visit: Payer: Self-pay | Admitting: Internal Medicine

## 2012-11-09 NOTE — Telephone Encounter (Signed)
Rx faxed to [facility] pharmacy.  

## 2012-11-09 NOTE — Telephone Encounter (Signed)
MD out of office. Pls advise...lmb

## 2012-12-28 ENCOUNTER — Other Ambulatory Visit: Payer: Self-pay | Admitting: Internal Medicine

## 2012-12-31 ENCOUNTER — Ambulatory Visit (INDEPENDENT_AMBULATORY_CARE_PROVIDER_SITE_OTHER): Payer: Medicare Other | Admitting: Internal Medicine

## 2012-12-31 ENCOUNTER — Encounter: Payer: Self-pay | Admitting: Internal Medicine

## 2012-12-31 VITALS — BP 108/65 | HR 70 | Ht 67.0 in | Wt 204.8 lb

## 2012-12-31 DIAGNOSIS — I471 Supraventricular tachycardia, unspecified: Secondary | ICD-10-CM

## 2012-12-31 DIAGNOSIS — I428 Other cardiomyopathies: Secondary | ICD-10-CM

## 2012-12-31 DIAGNOSIS — Z95810 Presence of automatic (implantable) cardiac defibrillator: Secondary | ICD-10-CM

## 2012-12-31 LAB — ICD DEVICE OBSERVATION
BRDY-0002RV: 40 {beats}/min
DEV-0020ICD: NEGATIVE
FVT: 0
RV LEAD IMPEDENCE ICD: 480 Ohm
RV LEAD THRESHOLD: 1 V
TZAT-0001FASTVT: 1
TZAT-0001SLOWVT: 1
TZAT-0001SLOWVT: 2
TZAT-0004SLOWVT: 6
TZAT-0004SLOWVT: 8
TZAT-0005SLOWVT: 88 pct
TZAT-0005SLOWVT: 91 pct
TZAT-0011FASTVT: 10 ms
TZAT-0013SLOWVT: 2
TZAT-0013SLOWVT: 2
TZAT-0018SLOWVT: NEGATIVE
TZAT-0018SLOWVT: NEGATIVE
TZAT-0019FASTVT: 8 V
TZAT-0020FASTVT: 1.6 ms
TZAT-0020SLOWVT: 1.6 ms
TZAT-0020SLOWVT: 1.6 ms
TZON-0008FASTVT: 0 ms
TZST-0001FASTVT: 2
TZST-0001FASTVT: 4
TZST-0001FASTVT: 6
TZST-0001SLOWVT: 3
TZST-0001SLOWVT: 5
TZST-0003FASTVT: 25 J
TZST-0003FASTVT: 35 J
TZST-0003SLOWVT: 25 J
TZST-0003SLOWVT: 35 J
VF: 0

## 2012-12-31 NOTE — Progress Notes (Signed)
Patient Care Team: Biagio Borg, MD as PCP - General   HPI  [name] [name] is a 72 y.o. female seen in followup for an ICD implanted 2008 for primary prevention in the setting of nonischemic heart disease. She has chronic systolic heart failure with a relatively narrow QRS and underwent single-chamber defibrillator implantation.   The patient denies chest pain, shortness of breath, nocturnal dyspnea, orthopnea or peripheral edema.  There have been no palpitations, lightheadedness or syncope.   Major complAINTS Related to the new onset of dysphagia with pills getting stuck asthma  And ear pain   Myoview in November 2008 showed an ejection fraction of 34%. There was mild apical thinning and a mild degree of ischemia cannot be excluded. Low risk. We have treated her medically.    Past Medical History  Diagnosis Date  . ALLERGIC RHINITIS 11/06/2007  . 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 ANEMIA-NOS 06/17/2008  . ANXIETY 02/22/2008  . CARDIOMYOPATHY, PRIMARY, DILATED 12/30/2008  . [place] DISEASE, 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  . GERD 05/07/2007  . HYPERLIPIDEMIA 05/23/2007  . HYPERTENSION 05/07/2007  . 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 MENOPAUSAL DISORDER 05/06/2008  . 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 OVERACTIVE BLADDER 08/03/2010  . PYELONEPHRITIS, HX OF 05/07/2007  . SYSTOLIC HEART FAILURE, CHRONIC 12/28/2008  . VITAMIN D DEFICIENCY 06/17/2008    Past Surgical History  Procedure Laterality Date  . Laminectomy    . Abdominal hysterectomy      Current Outpatient Prescriptions  Medication Sig Dispense Refill  . benazepril (LOTENSIN) 40 MG tablet Take 1 tablet (40 mg total) by mouth daily.  90 tablet  3  . carvedilol (COREG) 25 MG tablet Take 1 tablet (25 mg total) by mouth 2 (two) times daily.  180 tablet  3  . Cholecalciferol (VITAMIN D3) 1000 UNITS CAPS Take by mouth daily.        . ciprofloxacin-dexamethasone (CIPRODEX) otic suspension Place 4 drops into the  left ear as needed.      . diclofenac (VOLTAREN) 75 MG EC tablet Take 1 tablet (75 mg total) by mouth 2 (two) times daily. As neede for pain  180 tablet  3  . diphenhydrAMINE (BENADRYL) 25 MG tablet as needed.        [name] esomeprazole (NEXIUM) 40 MG capsule Take 1 capsule (40 mg total) by mouth daily.  90 capsule  3  . estradiol (ESTRACE) 1 MG tablet Take 1 tablet (1 mg total) by mouth daily.  90 tablet  3  . fluticasone (FLONASE) 50 MCG/ACT nasal spray Place 2 sprays into the nose daily.  16 g  5  . Fluticasone-Salmeterol (ADVAIR DISKUS) 250-50 MCG/DOSE AEPB Inhale 1 puff into the lungs 2 (two) times daily.  1 each  11  . furosemide (LASIX) 40 MG tablet TAKE 1/2 TO 1 TABLET BY MOUTH ONCE DAILY  90 tablet  3  . HYDROcodone-acetaminophen (NORCO/VICODIN) 5-325 MG per tablet TAKE 1 TABLET BY MOUTH EVERY 6 HOURS AS NEEDED FOR PAIN  120 tablet  2  . spironolactone (ALDACTONE) 25 MG tablet 1/2 by mouth once daily  45 tablet  3  . tiZANidine (ZANAFLEX) 4 MG tablet TAKE 1 TABLET (4 MG TOTAL) BY MOUTH EVERY 6 (SIX) HOURS AS NEEDED.  60 tablet  1   No current facility-administered medications for this visit.    Allergies  Allergen Reactions  . Zocor (Simvastatin - High Dose) Nausea Only    Review of Systems negative except from HPI and PMH  Physical Exam BP 108/65  Pulse 70  Ht 5\' 7"  (1.702 m)  Wt 204 lb 12.8 oz (92.897 kg)  BMI 32.07 kg/m2 Well developed and nourished in no acute distress HENT normal Neck supple with JVP-flat Clear Regular rate and rhythm, no murmurs or gallops Abd-soft with active BS No Clubbing cyanosis edema Skin-warm and dry A & Oriented  Grossly normal sensory and motor function    Assessment and  Plan

## 2012-12-31 NOTE — Assessment & Plan Note (Signed)
The patient's device was interrogated.  The information was reviewed. No changes were made in the programming.    

## 2012-12-31 NOTE — Patient Instructions (Addendum)
Your physician recommends that you schedule a follow-up appointment in: 3 months with device clinic.   Your physician wants you to follow-up in: 1 year with Dr. Caryl Comes. You will receive a reminder letter in the mail two months in advance. If you don't receive a letter, please call our office to schedule the follow-up appointment.  Your physician recommends that you continue on your current medications as directed. Please refer to the Current Medication list given to you today.

## 2012-12-31 NOTE — Assessment & Plan Note (Signed)
Stable on current medications will defer to Dr. Nolon Rod. Reassessment of LV function

## 2013-01-01 ENCOUNTER — Encounter: Payer: Medicare Other | Admitting: Internal Medicine

## 2013-01-27 ENCOUNTER — Other Ambulatory Visit: Payer: Self-pay | Admitting: Internal Medicine

## 2013-01-29 ENCOUNTER — Encounter: Payer: Self-pay | Admitting: Internal Medicine

## 2013-01-30 ENCOUNTER — Other Ambulatory Visit: Payer: Self-pay | Admitting: Internal Medicine

## 2013-02-06 ENCOUNTER — Telehealth: Payer: Self-pay | Admitting: Internal Medicine

## 2013-02-06 ENCOUNTER — Other Ambulatory Visit: Payer: Medicare Other

## 2013-02-06 DIAGNOSIS — Z0000 Encounter for general adult medical examination without abnormal findings: Secondary | ICD-10-CM

## 2013-02-06 NOTE — Telephone Encounter (Signed)
Labs ordered.

## 2013-02-06 NOTE — Telephone Encounter (Signed)
Message copied by Biagio Borg on Wed Feb 06, 2013  6:12 PM ------      Message from: [name] [name]      Created: Wed Feb 06, 2013 11:48 AM       This patient has a 6 month followup tomorrow 02/07/13 and needs labs entered.  Please advise ------

## 2013-02-07 ENCOUNTER — Other Ambulatory Visit (INDEPENDENT_AMBULATORY_CARE_PROVIDER_SITE_OTHER): Payer: Medicare Other

## 2013-02-07 ENCOUNTER — Encounter: Payer: Self-pay | Admitting: Internal Medicine

## 2013-02-07 ENCOUNTER — Ambulatory Visit (INDEPENDENT_AMBULATORY_CARE_PROVIDER_SITE_OTHER): Payer: Medicare Other | Admitting: Internal Medicine

## 2013-02-07 VITALS — BP 120/70 | HR 74 | Temp 97.6°F | Ht 67.0 in | Wt 209.8 lb

## 2013-02-07 DIAGNOSIS — Z23 Encounter for immunization: Secondary | ICD-10-CM

## 2013-02-07 DIAGNOSIS — Z0000 Encounter for general adult medical examination without abnormal findings: Secondary | ICD-10-CM

## 2013-02-07 DIAGNOSIS — E785 Hyperlipidemia, unspecified: Secondary | ICD-10-CM

## 2013-02-07 DIAGNOSIS — IMO0002 Reserved for concepts with insufficient information to code with codable children: Secondary | ICD-10-CM

## 2013-02-07 DIAGNOSIS — M1710 Unilateral primary osteoarthritis, unspecified knee: Secondary | ICD-10-CM

## 2013-02-07 DIAGNOSIS — E669 Obesity, unspecified: Secondary | ICD-10-CM

## 2013-02-07 LAB — CBC WITH DIFFERENTIAL/PLATELET
Basophils Absolute: 0 10*3/uL (ref 0.0–0.1)
Eosinophils Absolute: 0.2 10*3/uL (ref 0.0–0.7)
Lymphocytes Relative: 59.7 % — ABNORMAL HIGH (ref 12.0–46.0)
MCHC: 32.2 g/dL (ref 30.0–36.0)
MCV: 82.2 fl (ref 78.0–100.0)
Monocytes Absolute: 0.4 10*3/uL (ref 0.1–1.0)
Neutrophils Relative %: 29.4 % — ABNORMAL LOW (ref 43.0–77.0)
Platelets: 198 10*3/uL (ref 150.0–400.0)
RBC: 4.36 Mil/uL (ref 3.87–5.11)
RDW: 14.7 % — ABNORMAL HIGH (ref 11.5–14.6)

## 2013-02-07 LAB — URINALYSIS, ROUTINE W REFLEX MICROSCOPIC
Leukocytes, UA: NEGATIVE
RBC / HPF: NONE SEEN (ref 0–?)
Specific Gravity, Urine: 1.025 (ref 1.000–1.030)
Urobilinogen, UA: 0.2 (ref 0.0–1.0)

## 2013-02-07 LAB — HEPATIC FUNCTION PANEL
ALT: 14 U/L (ref 0–35)
AST: 14 U/L (ref 0–37)
Alkaline Phosphatase: 55 U/L (ref 39–117)
Bilirubin, Direct: 0.1 mg/dL (ref 0.0–0.3)
Total Protein: 7 g/dL (ref 6.0–8.3)

## 2013-02-07 LAB — BASIC METABOLIC PANEL
BUN: 17 mg/dL (ref 6–23)
Chloride: 107 mEq/L (ref 96–112)
Potassium: 4.6 mEq/L (ref 3.5–5.1)
Sodium: 140 mEq/L (ref 135–145)

## 2013-02-07 LAB — LIPID PANEL: VLDL: 20.2 mg/dL (ref 0.0–40.0)

## 2013-02-07 MED ORDER — SPIRONOLACTONE 25 MG PO TABS: ORAL_TABLET | ORAL | Status: DC

## 2013-02-07 MED ORDER — TIZANIDINE HCL 4 MG PO TABS: 4.0000 mg | ORAL_TABLET | Freq: Four times a day (QID) | ORAL | Status: DC | PRN

## 2013-02-07 MED ORDER — PHENTERMINE HCL 37.5 MG PO CAPS: 37.5000 mg | ORAL_CAPSULE | ORAL | Status: DC

## 2013-02-07 MED ORDER — ESOMEPRAZOLE MAGNESIUM 40 MG PO CPDR: 40.0000 mg | DELAYED_RELEASE_CAPSULE | Freq: Every day | ORAL | Status: DC

## 2013-02-07 MED ORDER — HYDROCODONE-ACETAMINOPHEN 5-325 MG PO TABS: ORAL_TABLET | ORAL | Status: DC

## 2013-02-07 MED ORDER — CARVEDILOL 25 MG PO TABS: 25.0000 mg | ORAL_TABLET | Freq: Two times a day (BID) | ORAL | Status: DC

## 2013-02-07 NOTE — Patient Instructions (Signed)
Please take all new medication as prescribed - the wt loss medication Please continue all other medications as before, and refills have been done if requested. Please have the pharmacy call with any other refills you may need in the future Please continue your efforts at being more active, low cholesterol diet, and weight control. You had the pneumonia shot today You are otherwise up to date with prevention measures today. Please keep your appointments with your specialists as you have planned - orthopedic You will be contacted regarding the referral for: colonoscopy Please go to the LAB in the Basement (turn left off the elevator) for the tests to be done today You will be contacted by phone if any changes need to be made immediately.  Otherwise, you will receive a letter about your results with an explanation, but please check with MyChart first.  Please remember to sign up for My Chart if you have not done so, as this will be important to you in the future with finding out test results, communicating by private email, and scheduling acute appointments online when needed.  Please return in 6 months, or sooner if needed

## 2013-02-07 NOTE — Progress Notes (Signed)
Subjective:    Patient ID: [name] [name], female    DOB: February 20, 1941, 72 y.o.   MRN: [id number]  HPI  Here for wellness and f/u;  Overall doing ok;  Pt denies CP, worsening SOB, DOE, wheezing, orthopnea, PND, worsening LE edema, palpitations, dizziness or syncope.  Pt denies neurological change such as new headache, facial or extremity weakness.  Pt denies polydipsia, polyuria, or low sugar symptoms. Pt states overall good compliance with treatment and medications, good tolerability, and has been trying to follow lower cholesterol diet.  Pt denies worsening depressive symptoms, suicidal ideation or panic. No fever, night sweats, wt loss, loss of appetite, or other constitutional symptoms.  Pt states good ability with ADL's, has low fall risk, home safety reviewed and adequate, no other significant changes in hearing or vision, and only occasionally active with exercise.  No new complaints.  Due for pneumonia, and was not able to get colon screening done last yr due to father illness.  Pain med overall still working ok, needs refill, still seeing orthopedic, asked her to try to lose wt, but she just cant seem to be able to do this due to cant exercise due to bilat knee pain. Past Medical History  Diagnosis Date  . ALLERGIC RHINITIS 11/06/2007  . 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 ANEMIA-NOS 06/17/2008  . ANXIETY 02/22/2008  . CARDIOMYOPATHY, PRIMARY, DILATED 12/30/2008  . [place] DISEASE, 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  . GERD 05/07/2007  . HYPERLIPIDEMIA 05/23/2007  . HYPERTENSION 05/07/2007  . 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 MENOPAUSAL DISORDER 05/06/2008  . 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 OVERACTIVE BLADDER 08/03/2010  . PYELONEPHRITIS, HX OF 05/07/2007  . SYSTOLIC HEART FAILURE, CHRONIC 12/28/2008  . VITAMIN D DEFICIENCY 06/17/2008   Past Surgical History  Procedure Laterality Date  . Laminectomy    . Abdominal hysterectomy      reports that she has never smoked. She  does not have any smokeless tobacco history on file. She reports that she does not drink alcohol or use illicit drugs. family history includes Arthritis in her other and Coronary artery disease in her other. Allergies  Allergen Reactions  . Zocor (Simvastatin - High Dose) Nausea Only   Current Outpatient Prescriptions on File Prior to Visit  Medication Sig Dispense Refill  . Cholecalciferol (VITAMIN D3) 1000 UNITS CAPS Take by mouth daily.        . fluticasone (FLONASE) 50 MCG/ACT nasal spray Place 2 sprays into the nose daily.  16 g  5  . furosemide (LASIX) 40 MG tablet TAKE 1/2 TO 1 TABLET BY MOUTH ONCE DAILY  90 tablet  3  . HYDROcodone-acetaminophen (NORCO/VICODIN) 5-325 MG per tablet TAKE 1 TABLET BY MOUTH EVERY 6 HOURS AS NEEDED FOR PAIN  120 tablet  2  . benazepril (LOTENSIN) 40 MG tablet Take 1 tablet (40 mg total) by mouth daily.  90 tablet  3  . diclofenac (VOLTAREN) 75 MG EC tablet Take 1 tablet (75 mg total) by mouth 2 (two) times daily. As neede for pain  180 tablet  3  . diphenhydrAMINE (BENADRYL) 25 MG tablet as needed.        [name] estradiol (ESTRACE) 1 MG tablet Take 1 tablet (1 mg total) by mouth daily.  90 tablet  3  . Fluticasone-Salmeterol (ADVAIR DISKUS) 250-50 MCG/DOSE AEPB Inhale 1 puff into the lungs 2 (two) times daily.  1 each  11   No current facility-administered medications on file prior to visit.   Review  of Systems Constitutional: Negative for diaphoresis, activity change, appetite change or unexpected weight change.  HENT: Negative for hearing loss, ear pain, facial swelling, mouth sores and neck stiffness.   Eyes: Negative for pain, redness and visual disturbance.  Respiratory: Negative for shortness of breath and wheezing.   Cardiovascular: Negative for chest pain and palpitations.  Gastrointestinal: Negative for diarrhea, blood in stool, abdominal distention or other pain Genitourinary: Negative for hematuria, flank pain or change in urine volume.   Musculoskeletal: Negative for myalgias and joint swelling.  Skin: Negative for color change and wound.  Neurological: Negative for syncope and numbness. other than noted Hematological: Negative for adenopathy.  Psychiatric/Behavioral: Negative for hallucinations, self-injury, decreased concentration and agitation.      Objective:   Physical Exam BP 120/70  Pulse 74  Temp(Src) 97.6 F (36.4 C) (Oral)  Ht 5\' 7"  (1.702 m)  Wt 209 lb 12 oz (95.142 kg)  BMI 32.84 kg/m2  SpO2 98% VS noted,  Constitutional: Pt is oriented to person, place, and time. Appears well-developed and well-nourished.  Head: Normocephalic and atraumatic.  Right Ear: External ear normal.  Left Ear: External ear normal.  Nose: Nose normal.  Mouth/Throat: Oropharynx is clear and moist.  Eyes: Conjunctivae and EOM are normal. Pupils are equal, round, and reactive to light.  Neck: Normal range of motion. Neck supple. No JVD present. No tracheal deviation present.  Cardiovascular: Normal rate, regular rhythm, normal heart sounds and intact distal pulses.   Pulmonary/Chest: Effort normal and breath sounds normal.  Abdominal: Soft. Bowel sounds are normal. There is no tenderness. No HSM  Musculoskeletal: Normal range of motion. Exhibits no edema.  Lymphadenopathy:  Has no cervical adenopathy.  Neurological: Pt is alert and oriented to person, place, and time. Pt has normal reflexes. No cranial nerve deficit.  Skin: Skin is warm and dry. No rash noted.  Psychiatric:  Has  normal mood and affect. Behavior is normal.     Assessment & Plan:

## 2013-02-10 DIAGNOSIS — E669 Obesity, unspecified: Secondary | ICD-10-CM | POA: Insufficient documentation

## 2013-02-10 NOTE — Assessment & Plan Note (Signed)

## 2013-02-10 NOTE — Assessment & Plan Note (Signed)
Stable, cont pain med prn

## 2013-02-10 NOTE — Assessment & Plan Note (Signed)
Ok for phentermine asd,  to f/u any worsening symptoms or concerns °

## 2013-02-13 ENCOUNTER — Other Ambulatory Visit: Payer: Self-pay

## 2013-03-18 ENCOUNTER — Other Ambulatory Visit: Payer: Self-pay | Admitting: Internal Medicine

## 2013-03-18 NOTE — Telephone Encounter (Signed)
Spoke to pharmacy, they did not receive refills that were sent in on 7.31.14.  Re-sent rx.

## 2013-04-01 ENCOUNTER — Encounter: Payer: Medicare Other | Admitting: *Deleted

## 2013-04-04 ENCOUNTER — Encounter: Payer: Self-pay | Admitting: Internal Medicine

## 2013-04-06 ENCOUNTER — Encounter: Payer: Self-pay | Admitting: *Deleted

## 2013-04-08 ENCOUNTER — Other Ambulatory Visit: Payer: Self-pay | Admitting: Internal Medicine

## 2013-04-09 NOTE — Telephone Encounter (Signed)
Done erx 

## 2013-04-24 ENCOUNTER — Encounter: Payer: Self-pay | Admitting: *Deleted

## 2013-04-24 ENCOUNTER — Other Ambulatory Visit: Payer: Self-pay | Admitting: Internal Medicine

## 2013-05-03 ENCOUNTER — Telehealth: Payer: Self-pay

## 2013-05-03 MED ORDER — HYDROCODONE-ACETAMINOPHEN 5-325 MG PO TABS: ORAL_TABLET | ORAL | Status: DC

## 2013-05-03 NOTE — Telephone Encounter (Signed)
Phone call from patient stating the pharmacy told her she needs a new up to date written prescription for her pain med. Please advise.

## 2013-05-03 NOTE — Telephone Encounter (Signed)
Called the patient left a detailed message that prescriptions are ready for pickup at the front desk.

## 2013-05-03 NOTE — Telephone Encounter (Signed)
Done hardcopy to robin  

## 2013-05-16 ENCOUNTER — Other Ambulatory Visit: Payer: Self-pay

## 2013-06-07 ENCOUNTER — Other Ambulatory Visit: Payer: Self-pay | Admitting: Internal Medicine

## 2013-06-08 NOTE — Telephone Encounter (Signed)
Ok to let pt know, this is not normally a medications for ongoing refills due to the lack of knowing the long term side effects, I would not advise long term use , and is not approved by FDA for this, thanks

## 2013-08-08 ENCOUNTER — Telehealth: Payer: Self-pay | Admitting: *Deleted

## 2013-08-08 DIAGNOSIS — G894 Chronic pain syndrome: Secondary | ICD-10-CM

## 2013-08-08 MED ORDER — HYDROCODONE-ACETAMINOPHEN 5-325 MG PO TABS: ORAL_TABLET | ORAL | Status: DC

## 2013-08-08 NOTE — Telephone Encounter (Signed)
Pt called requesting Hydrocodone refill.  Please advise 

## 2013-08-08 NOTE — Telephone Encounter (Signed)
Done hardcopy to robin, to also let pt know  You are given the letter today explaining the transitional pain medication refill policy due to recent change in [place] law and [place] [employer] regulations  Please be aware that I will no longer be able to offer monthly refills of any Schedule II or higher medication starting Aug 11, 2013

## 2013-08-09 ENCOUNTER — Ambulatory Visit (INDEPENDENT_AMBULATORY_CARE_PROVIDER_SITE_OTHER): Payer: Medicare Other | Admitting: *Deleted

## 2013-08-09 DIAGNOSIS — I471 Supraventricular tachycardia, unspecified: Secondary | ICD-10-CM

## 2013-08-09 DIAGNOSIS — I428 Other cardiomyopathies: Secondary | ICD-10-CM

## 2013-08-09 NOTE — Telephone Encounter (Signed)
Done per emr 

## 2013-08-09 NOTE — Telephone Encounter (Signed)
Patient informed to pickup hardcopy's at the front desk.  Please refer to pain management

## 2013-08-09 NOTE — Addendum Note (Signed)
Addended by: Biagio Borg on: 08/09/2013 01:22 PM   Modules accepted: Orders

## 2013-08-13 ENCOUNTER — Ambulatory Visit (INDEPENDENT_AMBULATORY_CARE_PROVIDER_SITE_OTHER): Payer: Medicare Other | Admitting: Internal Medicine

## 2013-08-13 ENCOUNTER — Ambulatory Visit (INDEPENDENT_AMBULATORY_CARE_PROVIDER_SITE_OTHER)
Admission: RE | Admit: 2013-08-13 | Discharge: 2013-08-13 | Disposition: A | Discharge status: Home / Self Care | Payer: Medicare Other | Source: Ambulatory Visit | Attending: Internal Medicine | Admitting: Internal Medicine

## 2013-08-13 ENCOUNTER — Encounter: Payer: Self-pay | Admitting: Internal Medicine

## 2013-08-13 ENCOUNTER — Other Ambulatory Visit (INDEPENDENT_AMBULATORY_CARE_PROVIDER_SITE_OTHER): Payer: Medicare Other

## 2013-08-13 VITALS — BP 118/78 | HR 79 | Temp 97.0°F | Wt 204.4 lb

## 2013-08-13 DIAGNOSIS — I509 Heart failure, unspecified: Secondary | ICD-10-CM

## 2013-08-13 DIAGNOSIS — I10 Essential (primary) hypertension: Secondary | ICD-10-CM

## 2013-08-13 DIAGNOSIS — E785 Hyperlipidemia, unspecified: Secondary | ICD-10-CM

## 2013-08-13 DIAGNOSIS — I5022 Chronic systolic (congestive) heart failure: Secondary | ICD-10-CM

## 2013-08-13 DIAGNOSIS — Z0000 Encounter for general adult medical examination without abnormal findings: Secondary | ICD-10-CM

## 2013-08-13 LAB — LIPID PANEL
Cholesterol: 187 mg/dL (ref 0–200)
HDL: 53.6 mg/dL (ref >39.00)
LDL Cholesterol: 116 mg/dL — ABNORMAL HIGH (ref 0–99)
Total CHOL/HDL Ratio: 3
Triglycerides: 88 mg/dL (ref 0.0–149.0)
VLDL: 17.6 mg/dL (ref 0.0–40.0)

## 2013-08-13 LAB — HEPATIC FUNCTION PANEL
ALBUMIN: 3.6 g/dL (ref 3.5–5.2)
ALT: 15 U/L (ref 0–35)
AST: 16 U/L (ref 0–37)
Alkaline Phosphatase: 61 U/L (ref 39–117)
Bilirubin, Direct: 0.1 mg/dL (ref 0.0–0.3)
Total Bilirubin: 0.6 mg/dL (ref 0.3–1.2)
Total Protein: 6.8 g/dL (ref 6.0–8.3)

## 2013-08-13 LAB — CBC WITH DIFFERENTIAL/PLATELET
Basophils Absolute: 0 10*3/uL (ref 0.0–0.1)
Basophils Relative: 0.5 % (ref 0.0–3.0)
EOS PCT: 2.8 % (ref 0.0–5.0)
Eosinophils Absolute: 0.2 10*3/uL (ref 0.0–0.7)
HCT: 35.6 % — ABNORMAL LOW (ref 36.0–46.0)
Hemoglobin: 11 g/dL — ABNORMAL LOW (ref 12.0–15.0)
LYMPHS PCT: 39.2 % (ref 12.0–46.0)
Lymphs Abs: 2.5 10*3/uL (ref 0.7–4.0)
MCHC: 30.9 g/dL (ref 30.0–36.0)
MCV: 84.8 fl (ref 78.0–100.0)
MONOS PCT: 8.1 % (ref 3.0–12.0)
Monocytes Absolute: 0.5 10*3/uL (ref 0.1–1.0)
NEUTROS ABS: 3.2 10*3/uL (ref 1.4–7.7)
NEUTROS PCT: 49.4 % (ref 43.0–77.0)
Platelets: 180 10*3/uL (ref 150.0–400.0)
RBC: 4.2 Mil/uL (ref 3.87–5.11)
RDW: 14.7 % — ABNORMAL HIGH (ref 11.5–14.6)
WBC: 6.4 10*3/uL (ref 4.5–10.5)

## 2013-08-13 LAB — BASIC METABOLIC PANEL
BUN: 17 mg/dL (ref 6–23)
CHLORIDE: 109 meq/L (ref 96–112)
CO2: 24 mEq/L (ref 19–32)
Calcium: 9.1 mg/dL (ref 8.4–10.5)
Creatinine, Ser: 1.1 mg/dL (ref 0.4–1.2)
GFR: 65.06 mL/min (ref >60.00)
GLUCOSE: 78 mg/dL (ref 70–99)
POTASSIUM: 4.6 meq/L (ref 3.5–5.1)
SODIUM: 139 meq/L (ref 135–145)

## 2013-08-13 LAB — URINALYSIS, ROUTINE W REFLEX MICROSCOPIC
Bilirubin Urine: NEGATIVE
KETONES UR: NEGATIVE
Nitrite: NEGATIVE
PH: 6 (ref 5.0–8.0)
TOTAL PROTEIN, URINE-UPE24: 30 — AB
URINE GLUCOSE: NEGATIVE
UROBILINOGEN UA: 0.2 (ref 0.0–1.0)

## 2013-08-13 LAB — TSH: TSH: 0.84 u[IU]/mL (ref 0.35–5.50)

## 2013-08-13 MED ORDER — FUROSEMIDE 40 MG PO TABS: ORAL_TABLET | ORAL | Status: DC

## 2013-08-13 NOTE — Progress Notes (Signed)
Pre-visit discussion using our clinic review tool. No additional management support is needed unless otherwise documented below in the visit note.  

## 2013-08-13 NOTE — Assessment & Plan Note (Addendum)
With some clinical evidence for worsening acutely, ECG reviewed as per emr, also for cxr and labs, but to increase the lasix from 40 mg - 1/2 qd to 40 bid for 3 days, then 40 qd after that, also refer back to Dr Stanford Breed, but to f/u here at 1 wk if not able to be seen that quickly per cards; to check daily wts and record/bring with her next visit, cont all other meds as is  Note:  Total time for pt hx, exam, review of record with pt in the room, determination of diagnoses and plan for further eval and tx is > 40 min, with over 50% spent in coordination and counseling of patient  ECG reviewed as per emr

## 2013-08-13 NOTE — Progress Notes (Signed)
Subjective:    Patient ID: [name] [name], female    DOB: 1940/09/04, 73 y.o.   MRN: [id number]  HPI  Here for evaluation for worsening sob/doe;  Was doing ok in her usual state of health until 5 days ago with felt a bit warm but seemed to pass that day without other obvious symptoms except for an episode of 40 min of racing heart, then next day with onset of dry cough and wheezing she could hear (both improved now) but then gradual worsening sob/doe/fatigue - loss of stamina/weakness and a kind of lightheadedness with exertion only over the last few days, now with overt symptoms as 50 ft.  Pt denies chest pain,  increased LE swelling, or syncope, but has had onset PND as well, having to sit up several times per night the last 2 nights to breathe better.  Advair for all this does not seem to help.  Was not able to see dr Stanford Breed immediately so came here.  Wt has increased several lbs at home, but she does not check daily recently as had been relatively stable Past Medical History  Diagnosis Date  . ALLERGIC RHINITIS 11/06/2007  . 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 ANEMIA-NOS 06/17/2008  . ANXIETY 02/22/2008  . CARDIOMYOPATHY, PRIMARY, DILATED 12/30/2008  . [place] DISEASE, 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  . GERD 05/07/2007  . HYPERLIPIDEMIA 05/23/2007  . HYPERTENSION 05/07/2007  . 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 MENOPAUSAL DISORDER 05/06/2008  . 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 OVERACTIVE BLADDER 08/03/2010  . PYELONEPHRITIS, HX OF 05/07/2007  . SYSTOLIC HEART FAILURE, CHRONIC 12/28/2008  . VITAMIN D DEFICIENCY 06/17/2008   Past Surgical History  Procedure Laterality Date  . Laminectomy    . Abdominal hysterectomy      reports that she has never smoked. She does not have any smokeless tobacco history on file. She reports that she does not drink alcohol or use illicit drugs. family history includes Arthritis in her other; Coronary artery disease in her other. Allergies    Allergen Reactions  . Zocor [Simvastatin - High Dose] Nausea Only   Current Outpatient Prescriptions on File Prior to Visit  Medication Sig Dispense Refill  . benazepril (LOTENSIN) 40 MG tablet Take 1 tablet (40 mg total) by mouth daily.  90 tablet  3  . carvedilol (COREG) 25 MG tablet Take 1 tablet (25 mg total) by mouth 2 (two) times daily.  180 tablet  3  . carvedilol (COREG) 25 MG tablet TAKE 1 TABLET BY MOUTH TWICE DAILY  180 tablet  3  . Cholecalciferol (VITAMIN D3) 1000 UNITS CAPS Take by mouth daily.        . diclofenac (VOLTAREN) 75 MG EC tablet TAKE 1 TABLET BY MOUTH TWICE DAILY  180 tablet  1  . diphenhydrAMINE (BENADRYL) 25 MG tablet as needed.        [name] esomeprazole (NEXIUM) 40 MG capsule Take 1 capsule (40 mg total) by mouth daily.  90 capsule  3  . estradiol (ESTRACE) 1 MG tablet TAKE 1 TABLET BY MOUTH DAILY  90 tablet  3  . fluticasone (FLONASE) 50 MCG/ACT nasal spray Place 2 sprays into the nose daily.  16 g  5  . Fluticasone-Salmeterol (ADVAIR DISKUS) 250-50 MCG/DOSE AEPB Inhale 1 puff into the lungs 2 (two) times daily.  1 each  11  . furosemide (LASIX) 40 MG tablet TAKE 1/2 TO 1 TABLET BY MOUTH ONCE DAILY  90 tablet  3  . HYDROcodone-acetaminophen (NORCO/VICODIN)  5-325 MG per tablet TAKE 1 TABLET BY MOUTH EVERY 6 HOURS AS NEEDED FOR PAIN- to fill  Oct 04, 2013  120 tablet  0  . phentermine 37.5 MG capsule Take 1 capsule (37.5 mg total) by mouth every morning.  30 capsule  2  . spironolactone (ALDACTONE) 25 MG tablet TAKE 1/2 TABLET BY MOUTH ONCE A DAY  45 tablet  3  . tiZANidine (ZANAFLEX) 4 MG tablet Take 1 tablet (4 mg total) by mouth every 6 (six) hours as needed.  60 tablet  4   No current facility-administered medications on file prior to visit.   Review of Systems  Constitutional: Negative for  unusual diaphoresis  HENT: Negative for tinnitus.   Eyes: Negative for photophobia and visual disturbance.  Respiratory: Negative for choking and stridor.    Gastrointestinal: Negative for vomiting and blood in stool.  Genitourinary: Negative for hematuria and decreased urine volume.  Musculoskeletal: Negative for acute joint swelling Skin: Negative for color change and wound.  Neurological: Negative for tremors and numbness other than noted  Psychiatric/Behavioral: Negative for decreased concentration or  hyperactivity.       Objective:   Physical Exam BP 118/78  Pulse 79  Temp(Src) 97 F (36.1 C) (Oral)  Wt 204 lb 6 oz (92.704 kg)  SpO2 93% Ambulatory sat at 100 ft - 98% VS noted,  Constitutional: Pt appears well-developed and well-nourished.  HENT: Head: NCAT.  Right Ear: External ear normal.  Left Ear: External ear normal.  Eyes: Conjunctivae and EOM are normal. Pupils are equal, round, and reactive to light.  Neck: Normal range of motion. Neck supple.  Cardiovascular: Normal rate and regular rhythm.   Pulmonary/Chest: Effort normal and breath sounds decreased bilat, ? Few rales at bases, no wheezing/rhonchi  Abd:  Soft, NT, non-distended, + BS Neurological: Pt is alert. Not confused  Skin: Skin is warm. No erythema.  Psychiatric: Pt behavior is normal. Thought content normal.     Assessment & Plan:

## 2013-08-13 NOTE — Patient Instructions (Addendum)
You appear to have some increase in the fluid retention of your body, with some extra fluid likely causing your shortness of breath.    OK to increase the lasix (furosemide) to 40 mg twice per day for 3 days, then 1 per day after that  Please go to the XRAY Department in the Basement (go straight as you get off the elevator) for the x-ray testing Please go to the LAB in the Basement (turn left off the elevator) for the tests to be done today You will be contacted by phone if any changes need to be made immediately.  Otherwise, you will receive a letter about your results with an explanation, but please check with MyChart first.  You will be contacted regarding the referral for: Dr Stanford Breed  Please return here in 1 week if you are unable to see Dr C, but also check your weight every day at home, write down the numbers, and bring with you to your next visit (either to me or Dr Stanford Breed)

## 2013-08-13 NOTE — Assessment & Plan Note (Signed)
stable overall by history and exam, recent data reviewed with pt, and pt to continue medical treatment as before,  to f/u any worsening symptoms or concerns BP Readings from Last 3 Encounters:  08/13/13 118/78  02/07/13 120/70  12/31/12 108/65

## 2013-08-15 ENCOUNTER — Ambulatory Visit: Payer: Medicare Other | Admitting: Physician Assistant

## 2013-08-16 ENCOUNTER — Encounter: Payer: Self-pay | Admitting: *Deleted

## 2013-08-16 ENCOUNTER — Ambulatory Visit (INDEPENDENT_AMBULATORY_CARE_PROVIDER_SITE_OTHER): Payer: Medicare Other | Admitting: Cardiology

## 2013-08-16 ENCOUNTER — Encounter: Payer: Self-pay | Admitting: Cardiology

## 2013-08-16 VITALS — BP 124/72 | HR 112 | Ht 67.0 in | Wt 198.4 lb

## 2013-08-16 DIAGNOSIS — I5022 Chronic systolic (congestive) heart failure: Secondary | ICD-10-CM

## 2013-08-16 MED ORDER — SPIRONOLACTONE 25 MG PO TABS: 25.0000 mg | ORAL_TABLET | Freq: Every day | ORAL | Status: DC

## 2013-08-16 MED ORDER — BENAZEPRIL HCL 10 MG PO TABS: 10.0000 mg | ORAL_TABLET | Freq: Every day | ORAL | Status: DC

## 2013-08-16 MED ORDER — FUROSEMIDE 40 MG PO TABS: 40.0000 mg | ORAL_TABLET | Freq: Every day | ORAL | Status: DC

## 2013-08-16 NOTE — Progress Notes (Signed)
HPI: FU nonischemic cardiomyopathy. Most recent nuclear study in November 2008 showed an ejection fraction of 34%. There was mild apical thinning and a mild degree of ischemia cannot be excluded. Low risk. We have treated her medically. 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 Last echocardiogram in March of 2012 showed an ejection fraction of 25-30%, mild left atrial enlargement and mild mitral regurgitation. I last saw her in April 2014. Patient seen recently by primary care with complaints of dyspnea. She noticed increasing dyspnea on exertion followed by orthopnea. No chest pain. Chest x-ray showed congestive heart failure. Lasix was increased. Since then, her dyspnea is improving. She denies lower extremity edema.  Current Outpatient Prescriptions  Medication Sig Dispense Refill  . carvedilol (COREG) 25 MG tablet Take 1 tablet (25 mg total) by mouth 2 (two) times daily.  180 tablet  3  . Cholecalciferol (VITAMIN D3) 1000 UNITS CAPS Take by mouth daily.        . diphenhydrAMINE (BENADRYL) 25 MG tablet as needed.        [name] esomeprazole (NEXIUM) 40 MG capsule Take 1 capsule (40 mg total) by mouth daily.  90 capsule  3  . estradiol (ESTRACE) 1 MG tablet TAKE 1 TABLET BY MOUTH DAILY  90 tablet  3  . fluticasone (FLONASE) 50 MCG/ACT nasal spray Place 2 sprays into the nose daily.  16 g  5  . furosemide (LASIX) 40 MG tablet TAKE 1 tab by mouth twice per day  180 tablet  3  . HYDROcodone-acetaminophen (NORCO/VICODIN) 5-325 MG per tablet TAKE 1 TABLET BY MOUTH EVERY 6 HOURS AS NEEDED FOR PAIN- to fill  Oct 04, 2013  120 tablet  0  . spironolactone (ALDACTONE) 25 MG tablet TAKE 1/2 TABLET BY MOUTH ONCE A DAY  45 tablet  3  . tiZANidine (ZANAFLEX) 4 MG tablet Take 1 tablet (4 mg total) by mouth every 6 (six) hours as needed.  60 tablet  4   No current facility-administered medications for this visit.     Past Medical History  Diagnosis Date  . ALLERGIC RHINITIS 11/06/2007  . ANEMIA-IRON  DEFICIENCY 05/23/2007  . ANEMIA-NOS 06/17/2008  . ANXIETY 02/22/2008  . CARDIOMYOPATHY, PRIMARY, DILATED 12/30/2008  . [place] DISEASE, 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  . GERD 05/07/2007  . HYPERLIPIDEMIA 05/23/2007  . HYPERTENSION 05/07/2007  . 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 MENOPAUSAL DISORDER 05/06/2008  . 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 OVERACTIVE BLADDER 08/03/2010  . PYELONEPHRITIS, HX OF 05/07/2007  . SYSTOLIC HEART FAILURE, CHRONIC 12/28/2008  . VITAMIN D DEFICIENCY 06/17/2008    Past Surgical History  Procedure Laterality Date  . Laminectomy    . Abdominal hysterectomy      History   Social History  . Marital Status: Widowed    Spouse Name: N/A    Number of Children: 3  . Years of Education: N/A   Occupational History  . 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 Smoking status: Never Smoker   . Smokeless tobacco: Not on file  . Alcohol Use: No  . Drug Use: No  . Sexual Activity: Not on file   Other Topics Concern  . Not on file   Social History Narrative  . No narrative on file    ROS: nonproductive cough but no fevers or chills, hemoptysis, dysphasia, odynophagia, melena, hematochezia, dysuria, hematuria, rash, seizure activity, claudication. Remaining systems are negative.  Physical Exam: Well-developed well-nourished  in no acute distress.  Skin is warm and dry.  HEENT is normal.  Neck is supple.  Chest is clear to auscultation with normal expansion.  Cardiovascular exam is regular rate and rhythm.  Abdominal exam nontender or distended. No masses palpated. Extremities show trace edema. neuro grossly intact  ECG 08/13/2013-sinus rhythm, PVCs, left ventricular hypertrophy with repolarization abnormality.

## 2013-08-16 NOTE — Assessment & Plan Note (Signed)
Continue present blood pressure medications. 

## 2013-08-16 NOTE — Assessment & Plan Note (Signed)
Patient with recent volume excess. Her symptoms have improved after her Lasix was increased to 80 mg daily for 3 days. I will resume 40 mg daily tomorrow. She had previously been on 20 mg daily. Increase spironolactone to 25 mg daily. Check potassium and renal function next week. She is drinking increased amounts of fluids. I have asked her to restrict to 1.5 L daily. We discussed low-sodium diet. I will see her back in 4 weeks to make sure that she continues to improve.

## 2013-08-16 NOTE — Assessment & Plan Note (Signed)
Management per primary care. 

## 2013-08-16 NOTE — Assessment & Plan Note (Signed)
Followed by electrophysiology. 

## 2013-08-16 NOTE — Assessment & Plan Note (Signed)
Patient discontinued her ACE inhibitor on her own. I will resume Lotensin at 10 mg daily and increase as tolerated by blood pressure. Continue beta blocker. Repeat echocardiogram.

## 2013-08-16 NOTE — Patient Instructions (Signed)
Your physician recommends that you schedule a follow-up appointment in: 4 WEEKS WITH DR CRENSHAW  DECREASE FUROSEMIDE TO 40 MG ONCE DAILY STARTING TOMORROW  INCREASE SPIRONOLACTONE TO 25 MG ONCE DAILY   RESTART BENAZEPRIL 10 MG ONCE DAILY  Your physician has requested that you have an echocardiogram. Echocardiography is a painless test that uses sound waves to create images of your heart. It provides your doctor with information about the size and shape of your heart and how well your heart's chambers and valves are working. This procedure takes approximately one hour. There are no restrictions for this procedure.   Your physician recommends that you return for lab work Wednesday NEXT WEEK

## 2013-08-18 ENCOUNTER — Encounter: Payer: Self-pay | Admitting: Internal Medicine

## 2013-08-19 NOTE — Telephone Encounter (Signed)
Routed to device clinic to follow up

## 2013-08-20 ENCOUNTER — Ambulatory Visit: Payer: Medicare Other | Admitting: Internal Medicine

## 2013-08-21 ENCOUNTER — Other Ambulatory Visit: Payer: Medicare Other

## 2013-08-22 ENCOUNTER — Other Ambulatory Visit (INDEPENDENT_AMBULATORY_CARE_PROVIDER_SITE_OTHER): Payer: Medicare Other

## 2013-08-22 DIAGNOSIS — I5022 Chronic systolic (congestive) heart failure: Secondary | ICD-10-CM

## 2013-08-22 LAB — BASIC METABOLIC PANEL
BUN: 21 mg/dL (ref 6–23)
CALCIUM: 9.4 mg/dL (ref 8.4–10.5)
CO2: 28 mEq/L (ref 19–32)
CREATININE: 1.3 mg/dL — AB (ref 0.4–1.2)
Chloride: 106 mEq/L (ref 96–112)
GFR: 53.87 mL/min — ABNORMAL LOW (ref >60.00)
Glucose, Bld: 95 mg/dL (ref 70–99)
Potassium: 5.3 mEq/L — ABNORMAL HIGH (ref 3.5–5.1)
Sodium: 139 mEq/L (ref 135–145)

## 2013-08-28 ENCOUNTER — Other Ambulatory Visit: Payer: Self-pay | Admitting: *Deleted

## 2013-08-28 DIAGNOSIS — E875 Hyperkalemia: Secondary | ICD-10-CM

## 2013-08-28 LAB — MDC_IDC_ENUM_SESS_TYPE_REMOTE
Battery Voltage: 2.99 V
Date Time Interrogation Session: 20150131135500
HIGH POWER IMPEDANCE MEASURED VALUE: 39 Ohm
Lead Channel Impedance Value: 416 Ohm
Lead Channel Sensing Intrinsic Amplitude: 14.4 mV
Lead Channel Setting Pacing Amplitude: 2.5 V
Lead Channel Setting Pacing Pulse Width: 0.4 ms
Lead Channel Setting Sensing Sensitivity: 0.3 mV
MDC IDC SET ZONE DETECTION INTERVAL: 240 ms
MDC IDC SET ZONE DETECTION INTERVAL: 300 ms
MDC IDC STAT BRADY RV PERCENT PACED: 0 %
Zone Setting Detection Interval: 400 ms

## 2013-08-30 ENCOUNTER — Telehealth: Payer: Self-pay | Admitting: *Deleted

## 2013-08-30 ENCOUNTER — Encounter: Payer: Self-pay | Admitting: Cardiology

## 2013-08-30 DIAGNOSIS — E875 Hyperkalemia: Secondary | ICD-10-CM

## 2013-08-30 NOTE — Telephone Encounter (Signed)
Pt just got message to stop spironolactone last night. She will have repeat bmp next Friday 09/06/13 as she has an ECHO in our office at 8:30am  [place]

## 2013-09-06 ENCOUNTER — Other Ambulatory Visit (HOSPITAL_COMMUNITY): Payer: Medicare Other

## 2013-09-09 LAB — HM MAMMOGRAPHY

## 2013-09-27 ENCOUNTER — Other Ambulatory Visit: Payer: Self-pay

## 2013-09-27 ENCOUNTER — Ambulatory Visit (HOSPITAL_COMMUNITY): Payer: Medicare Other | Attending: Cardiology | Admitting: Radiology

## 2013-09-27 ENCOUNTER — Other Ambulatory Visit: Payer: Medicare Other

## 2013-09-27 ENCOUNTER — Encounter: Payer: Self-pay | Admitting: Cardiology

## 2013-09-27 ENCOUNTER — Ambulatory Visit (INDEPENDENT_AMBULATORY_CARE_PROVIDER_SITE_OTHER): Payer: Medicare Other | Admitting: Cardiology

## 2013-09-27 VITALS — BP 116/70 | HR 80 | Ht 67.0 in | Wt 196.0 lb

## 2013-09-27 DIAGNOSIS — E875 Hyperkalemia: Secondary | ICD-10-CM

## 2013-09-27 DIAGNOSIS — E785 Hyperlipidemia, unspecified: Secondary | ICD-10-CM

## 2013-09-27 DIAGNOSIS — I428 Other cardiomyopathies: Secondary | ICD-10-CM | POA: Insufficient documentation

## 2013-09-27 DIAGNOSIS — I10 Essential (primary) hypertension: Secondary | ICD-10-CM

## 2013-09-27 DIAGNOSIS — I5022 Chronic systolic (congestive) heart failure: Secondary | ICD-10-CM

## 2013-09-27 DIAGNOSIS — Z95810 Presence of automatic (implantable) cardiac defibrillator: Secondary | ICD-10-CM

## 2013-09-27 LAB — BASIC METABOLIC PANEL
BUN: 17 mg/dL (ref 6–23)
CALCIUM: 9.4 mg/dL (ref 8.4–10.5)
CO2: 27 meq/L (ref 19–32)
CREATININE: 1.3 mg/dL — AB (ref 0.4–1.2)
Chloride: 104 mEq/L (ref 96–112)
GFR: 53.37 mL/min — ABNORMAL LOW (ref >60.00)
Glucose, Bld: 89 mg/dL (ref 70–99)
Potassium: 5.1 mEq/L (ref 3.5–5.1)
Sodium: 140 mEq/L (ref 135–145)

## 2013-09-27 NOTE — Assessment & Plan Note (Signed)
Management per primary care. 

## 2013-09-27 NOTE — Assessment & Plan Note (Signed)
Continue ACE inhibitor and beta blocker. I will not advance Lotensin as she has some orthostatic symptoms.

## 2013-09-27 NOTE — Assessment & Plan Note (Signed)
Blood pressure controlled. Continue present medications. 

## 2013-09-27 NOTE — Assessment & Plan Note (Signed)
Followed by electrophysiology. 

## 2013-09-27 NOTE — Patient Instructions (Signed)
Your physician wants you to follow-up in: 6 MONTHS WITH DR CRENSHAW You will receive a reminder letter in the mail two months in advance. If you don't receive a letter, please call our office to schedule the follow-up appointment.  

## 2013-09-27 NOTE — Assessment & Plan Note (Signed)
Patient CHF symptoms have improved. Continue present dose of Lasix. Potassium increased with spironolactone. Plan repeat bmet today. If potassium has improved I may add low dose spironolactone at 12.5 mg daily. Await followup echocardiogram.

## 2013-09-27 NOTE — Progress Notes (Signed)
Echocardiogram performed.  

## 2013-09-27 NOTE — Progress Notes (Signed)
HPI: FU nonischemic cardiomyopathy. Most recent nuclear study in November 2008 showed an ejection fraction of 34%. There was mild apical thinning and a mild degree of ischemia cannot be excluded. Low risk. We have treated her medically. 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 Last echocardiogram in March of 2012 showed an ejection fraction of 25-30%, mild left atrial enlargement and mild mitral regurgitation. I last saw her in Feb 2015. Since then, she has dyspnea on exertion but improved. No orthopnea, PND, pedal edema or exertional chest pain. Some dizziness with standing.   Current Outpatient Prescriptions  Medication Sig Dispense Refill  . benazepril (LOTENSIN) 10 MG tablet Take 1 tablet (10 mg total) by mouth daily.  30 tablet  12  . carvedilol (COREG) 25 MG tablet Take 1 tablet (25 mg total) by mouth 2 (two) times daily.  180 tablet  3  . Cholecalciferol (VITAMIN D3) 1000 UNITS CAPS Take by mouth daily.        . diclofenac (VOLTAREN) 75 MG EC tablet       . diphenhydrAMINE (BENADRYL) 25 MG tablet as needed.        [name] esomeprazole (NEXIUM) 40 MG capsule Take 1 capsule (40 mg total) by mouth daily.  90 capsule  3  . estradiol (ESTRACE) 1 MG tablet TAKE 1 TABLET BY MOUTH DAILY  90 tablet  3  . fluticasone (FLONASE) 50 MCG/ACT nasal spray Place 2 sprays into the nose daily.  16 g  5  . furosemide (LASIX) 40 MG tablet Take 1 tablet (40 mg total) by mouth daily.  180 tablet  3  . HYDROcodone-acetaminophen (NORCO/VICODIN) 5-325 MG per tablet TAKE 1 TABLET BY MOUTH EVERY 6 HOURS AS NEEDED FOR PAIN- to fill  Oct 04, 2013  120 tablet  0  . tiZANidine (ZANAFLEX) 4 MG tablet Take 1 tablet (4 mg total) by mouth every 6 (six) hours as needed.  60 tablet  4   No current facility-administered medications for this visit.     Past Medical History  Diagnosis Date  . ALLERGIC RHINITIS 11/06/2007  . 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 ANEMIA-NOS 06/17/2008  . ANXIETY 02/22/2008  .  CARDIOMYOPATHY, PRIMARY, DILATED 12/30/2008  . [place] DISEASE, 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  . GERD 05/07/2007  . HYPERLIPIDEMIA 05/23/2007  . HYPERTENSION 05/07/2007  . 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 MENOPAUSAL DISORDER 05/06/2008  . 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 OVERACTIVE BLADDER 08/03/2010  . PYELONEPHRITIS, HX OF 05/07/2007  . SYSTOLIC HEART FAILURE, CHRONIC 12/28/2008  . VITAMIN D DEFICIENCY 06/17/2008    Past Surgical History  Procedure Laterality Date  . Laminectomy    . Abdominal hysterectomy      History   Social History  . Marital Status: Widowed    Spouse Name: N/A    Number of Children: 3  . Years of Education: N/A   Occupational History  . 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 Smoking status: Never Smoker   . Smokeless tobacco: Not on file  . Alcohol Use: No  . Drug Use: No  . Sexual Activity: Not on file   Other Topics Concern  . Not on file   Social History Narrative  . No narrative on file    ROS: no fevers or chills, productive cough, hemoptysis, dysphasia, odynophagia, melena, hematochezia, dysuria, hematuria, rash, seizure activity, orthopnea, PND, pedal edema, claudication. Remaining systems are negative.  Physical Exam: Well-developed well-nourished in  no acute distress.  Skin is warm and dry.  HEENT is normal.  Neck is supple.  Chest is clear to auscultation with normal expansion.  Cardiovascular exam is regular rate and rhythm.  Abdominal exam nontender or distended. No masses palpated. Extremities show no edema. neuro grossly intact

## 2013-09-30 ENCOUNTER — Encounter: Payer: Self-pay | Admitting: *Deleted

## 2013-10-08 ENCOUNTER — Encounter: Payer: Self-pay | Admitting: Internal Medicine

## 2013-11-02 ENCOUNTER — Other Ambulatory Visit: Payer: Self-pay | Admitting: Internal Medicine

## 2013-11-08 ENCOUNTER — Telehealth: Payer: Self-pay | Admitting: Cardiology

## 2013-11-08 ENCOUNTER — Telehealth: Payer: Self-pay | Admitting: Internal Medicine

## 2013-11-08 NOTE — Telephone Encounter (Signed)
New problem     Pt wold like a call back about some symptoms she is having.   She is having pain from her shoulder to her fingers in her left arm.  This has been going on for a week know.

## 2013-11-08 NOTE — Telephone Encounter (Signed)
Doubt related to pacemaker; sounds muskuloskeetal Lelon Perla

## 2013-11-08 NOTE — Telephone Encounter (Signed)
New problem     Pt wold like a call back about some symptoms she is having.   She is having pain from her shoulder to her fingers in her left arm.  This has been going on for a week know

## 2013-11-08 NOTE — Telephone Encounter (Signed)
Pt states that for a week and a 1/2 she has been having a numbness sensation in her left arm starts in her finger tips and goes up to her left shoulder. The elbow inside is tender . Pt states this numbness sensation  is constant. Pt states that if she holds her arm down; the feeling come  Back. Pt thinks she has had  this before, but she did not pay any attention. Pt said that sometimes she sleeps on her left side. Pt does not know if the   PACEMAKER has something to do with these symptoms. Pt  had a pacemaker implant in her left side 7 years ago.These symptoms are new. Pt will call her PCP today to see what he said. Pt is aware that if symptoms get worse, she needs to go to the ER. Pt is aware that this message will be send to Dr. Stanford Breed for reviewing.

## 2013-11-11 ENCOUNTER — Encounter: Payer: Self-pay | Admitting: Internal Medicine

## 2013-11-11 NOTE — Telephone Encounter (Signed)
Left message of dr Jacalyn Lefevre recommendations. She is to call back if cont to have problems.

## 2013-12-25 ENCOUNTER — Encounter: Payer: Medicare Other | Admitting: Internal Medicine

## 2014-01-31 ENCOUNTER — Telehealth: Payer: Self-pay | Admitting: Internal Medicine

## 2014-01-31 NOTE — Telephone Encounter (Signed)
New Message  Pt daughter called. Requests a call back to discuss why t

## 2014-02-04 ENCOUNTER — Encounter: Payer: Medicare Other | Admitting: Internal Medicine

## 2014-03-26 ENCOUNTER — Other Ambulatory Visit: Payer: Self-pay | Admitting: Internal Medicine

## 2014-03-31 ENCOUNTER — Encounter: Payer: Self-pay | Admitting: Cardiology

## 2014-03-31 ENCOUNTER — Ambulatory Visit (INDEPENDENT_AMBULATORY_CARE_PROVIDER_SITE_OTHER): Payer: Medicare Other | Admitting: Cardiology

## 2014-03-31 VITALS — BP 106/60 | HR 66 | Ht 67.0 in | Wt 208.0 lb

## 2014-03-31 DIAGNOSIS — I5022 Chronic systolic (congestive) heart failure: Secondary | ICD-10-CM

## 2014-03-31 MED ORDER — SPIRONOLACTONE 25 MG PO TABS: 25.0000 mg | ORAL_TABLET | Freq: Every day | ORAL | Status: DC

## 2014-03-31 MED ORDER — CARVEDILOL 12.5 MG PO TABS: 12.5000 mg | ORAL_TABLET | Freq: Two times a day (BID) | ORAL | Status: DC

## 2014-03-31 MED ORDER — FUROSEMIDE 40 MG PO TABS: 40.0000 mg | ORAL_TABLET | Freq: Two times a day (BID) | ORAL | Status: DC

## 2014-03-31 NOTE — Assessment & Plan Note (Addendum)
Followed by Electrophysiology.

## 2014-03-31 NOTE — Patient Instructions (Signed)
Your physician recommends that you schedule a follow-up appointment in: 3 MONTHS WITH DR CRENSHAW  DECREASE CARVEDILOL TO 12.5 MG TWICE DAILY= 1/2 OF THE 25 MG TABLET TWICE DAILY  START SPIRONOLACTONE 25 MG TAKE 1/2 TABLET ONCE DAILY  Your physician recommends that you return for lab work in: [place]

## 2014-03-31 NOTE — Progress Notes (Signed)
HPI: FU nonischemic cardiomyopathy. Most recent nuclear study in November 2008 showed an ejection fraction of 34%. There was mild apical thinning and a mild degree of ischemia cannot be excluded. Low risk. We have treated her medically. 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 Last echocardiogram in March of 2015 showed an ejection fraction of 15-20% and mild to moderate mitral regurgitation. Since I last saw her, She has dyspnea on exertion but no orthopnea, PND or pedal edema. She has chest pain both at rest and with exertion. She has dizziness with standing. No syncope.   Current Outpatient Prescriptions  Medication Sig Dispense Refill  . benazepril (LOTENSIN) 10 MG tablet Take 1 tablet (10 mg total) by mouth daily.  30 tablet  12  . carvedilol (COREG) 25 MG tablet Take 1 tablet (25 mg total) by mouth 2 (two) times daily.  180 tablet  3  . Cholecalciferol (VITAMIN D3) 1000 UNITS CAPS Take by mouth daily.        . diclofenac (VOLTAREN) 75 MG EC tablet TAKE 1 TABLET BY MOUTH TWICE DAILY  60 tablet  2  . diphenhydrAMINE (BENADRYL) 25 MG tablet as needed.        [name] estradiol (ESTRACE) 1 MG tablet TAKE 1 TABLET BY MOUTH DAILY  90 tablet  3  . fluticasone (FLONASE) 50 MCG/ACT nasal spray Place 2 sprays into the nose daily.  16 g  5  . furosemide (LASIX) 40 MG tablet Take 1 tablet (40 mg total) by mouth daily.  180 tablet  3  . HYDROcodone-acetaminophen (NORCO/VICODIN) 5-325 MG per tablet TAKE 1 TABLET BY MOUTH EVERY 6 HOURS AS NEEDED FOR PAIN- to fill  Oct 04, 2013  120 tablet  0  . tiZANidine (ZANAFLEX) 4 MG tablet Take 1 tablet (4 mg total) by mouth every 6 (six) hours as needed.  60 tablet  4  . diclofenac (VOLTAREN) 75 MG EC tablet       . esomeprazole (NEXIUM) 40 MG capsule Take 1 capsule (40 mg total) by mouth daily.  90 capsule  3   No current facility-administered medications for this visit.     Past Medical History  Diagnosis Date  . ALLERGIC RHINITIS 11/06/2007  .  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 ANEMIA-NOS 06/17/2008  . ANXIETY 02/22/2008  . CARDIOMYOPATHY, PRIMARY, DILATED 12/30/2008  . [place] DISEASE, 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  . GERD 05/07/2007  . HYPERLIPIDEMIA 05/23/2007  . HYPERTENSION 05/07/2007  . 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 MENOPAUSAL DISORDER 05/06/2008  . 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 OVERACTIVE BLADDER 08/03/2010  . PYELONEPHRITIS, HX OF 05/07/2007  . SYSTOLIC HEART FAILURE, CHRONIC 12/28/2008  . VITAMIN D DEFICIENCY 06/17/2008    Past Surgical History  Procedure Laterality Date  . Laminectomy    . Abdominal hysterectomy      History   Social History  . Marital Status: Widowed    Spouse Name: N/A    Number of Children: 3  . Years of Education: N/A   Occupational History  . 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 Smoking status: Never Smoker   . Smokeless tobacco: Not on file  . Alcohol Use: No  . Drug Use: No  . Sexual Activity: Not on file   Other Topics Concern  . Not on file   Social History Narrative  . No narrative on file    ROS: no fevers or chills, productive cough, hemoptysis, dysphasia,  odynophagia, melena, hematochezia, dysuria, hematuria, rash, seizure activity, orthopnea, PND, pedal edema, claudication. Remaining systems are negative.  Physical Exam: Well-developed well-nourished in no acute distress.  Skin is warm and dry.  HEENT is normal.  Neck is supple.  Chest is clear to auscultation with normal expansion.  Cardiovascular exam is regular rate and rhythm.  Abdominal exam nontender or distended. No masses palpated. Extremities show no edema. neuro grossly intact  ECG Sinus rhythm, left ventricular hypertrophy with probable repolarization abnormality

## 2014-03-31 NOTE — Assessment & Plan Note (Signed)
Blood pressure medications as outlined under cardio myopathy.

## 2014-03-31 NOTE — Assessment & Plan Note (Signed)
Patient is complaining of orthostatic symptoms. Decreased carvedilol to 12.5 mg by mouth twice a day. Continue ACE inhibitor. Continue present dose of Lasix. Add Spironolactone 12.5 mg daily. She had hyperkalemia previously with higher doses of sprioronolactone. Check potassium, renal function and BNP in one week.

## 2014-03-31 NOTE — Assessment & Plan Note (Signed)
Patient appears to be euvolemic on examination. Continue present dose of diuretics.

## 2014-04-03 ENCOUNTER — Telehealth: Payer: Self-pay | Admitting: Internal Medicine

## 2014-04-03 NOTE — Telephone Encounter (Signed)
Pt request Tetanus/Tdap from my chart. Please advise, is this is okey?

## 2014-04-03 NOTE — Telephone Encounter (Signed)
[place] for Tdap at  Nurse visit

## 2014-04-04 NOTE — Telephone Encounter (Signed)
Informed pt of MD response. Note placed on next visit for pt to receive TDaP

## 2014-04-10 ENCOUNTER — Encounter: Payer: Medicare Other | Admitting: Internal Medicine

## 2014-04-17 ENCOUNTER — Encounter: Payer: Medicare Other | Admitting: Internal Medicine

## 2014-05-01 ENCOUNTER — Other Ambulatory Visit (INDEPENDENT_AMBULATORY_CARE_PROVIDER_SITE_OTHER): Payer: Medicare Other

## 2014-05-01 ENCOUNTER — Ambulatory Visit (INDEPENDENT_AMBULATORY_CARE_PROVIDER_SITE_OTHER): Payer: Medicare Other | Admitting: Internal Medicine

## 2014-05-01 ENCOUNTER — Encounter: Payer: Self-pay | Admitting: Internal Medicine

## 2014-05-01 VITALS — BP 112/60 | HR 75 | Temp 98.2°F | Ht 67.0 in | Wt 202.5 lb

## 2014-05-01 DIAGNOSIS — Z0000 Encounter for general adult medical examination without abnormal findings: Secondary | ICD-10-CM

## 2014-05-01 DIAGNOSIS — I5022 Chronic systolic (congestive) heart failure: Secondary | ICD-10-CM

## 2014-05-01 DIAGNOSIS — I10 Essential (primary) hypertension: Secondary | ICD-10-CM

## 2014-05-01 DIAGNOSIS — Z23 Encounter for immunization: Secondary | ICD-10-CM

## 2014-05-01 DIAGNOSIS — E785 Hyperlipidemia, unspecified: Secondary | ICD-10-CM

## 2014-05-01 LAB — BASIC METABOLIC PANEL
BUN: 19 mg/dL (ref 6–23)
CHLORIDE: 106 meq/L (ref 96–112)
CO2: 26 mEq/L (ref 19–32)
Calcium: 9 mg/dL (ref 8.4–10.5)
Creatinine, Ser: 1.3 mg/dL — ABNORMAL HIGH (ref 0.4–1.2)
GFR: 50.52 mL/min — AB (ref >60.00)
Glucose, Bld: 92 mg/dL (ref 70–99)
POTASSIUM: 4.5 meq/L (ref 3.5–5.1)
SODIUM: 139 meq/L (ref 135–145)

## 2014-05-01 LAB — URINALYSIS, ROUTINE W REFLEX MICROSCOPIC
BILIRUBIN URINE: NEGATIVE
KETONES UR: NEGATIVE
NITRITE: NEGATIVE
PH: 6.5 (ref 5.0–8.0)
Specific Gravity, Urine: 1.01 (ref 1.000–1.030)
Total Protein, Urine: NEGATIVE
Urine Glucose: NEGATIVE
Urobilinogen, UA: 0.2 (ref 0.0–1.0)

## 2014-05-01 LAB — CBC WITH DIFFERENTIAL/PLATELET
BASOS PCT: 1.1 % (ref 0.0–3.0)
Basophils Absolute: 0.1 10*3/uL (ref 0.0–0.1)
EOS ABS: 0.1 10*3/uL (ref 0.0–0.7)
Eosinophils Relative: 2.5 % (ref 0.0–5.0)
HEMATOCRIT: 36.6 % (ref 36.0–46.0)
Hemoglobin: 11.6 g/dL — ABNORMAL LOW (ref 12.0–15.0)
LYMPHS ABS: 3.3 10*3/uL (ref 0.7–4.0)
Lymphocytes Relative: 55.8 % — ABNORMAL HIGH (ref 12.0–46.0)
MCHC: 31.6 g/dL (ref 30.0–36.0)
MCV: 82.9 fl (ref 78.0–100.0)
MONO ABS: 0.4 10*3/uL (ref 0.1–1.0)
Monocytes Relative: 7.5 % (ref 3.0–12.0)
NEUTROS PCT: 33.1 % — AB (ref 43.0–77.0)
Neutro Abs: 2 10*3/uL (ref 1.4–7.7)
Platelets: 188 10*3/uL (ref 150.0–400.0)
RBC: 4.42 Mil/uL (ref 3.87–5.11)
RDW: 14.8 % (ref 11.5–15.5)
WBC: 5.9 10*3/uL (ref 4.0–10.5)

## 2014-05-01 LAB — HEPATIC FUNCTION PANEL
ALT: 16 U/L (ref 0–35)
AST: 14 U/L (ref 0–37)
Albumin: 3.3 g/dL — ABNORMAL LOW (ref 3.5–5.2)
Alkaline Phosphatase: 58 U/L (ref 39–117)
BILIRUBIN DIRECT: 0.1 mg/dL (ref 0.0–0.3)
BILIRUBIN TOTAL: 0.6 mg/dL (ref 0.2–1.2)
Total Protein: 7.1 g/dL (ref 6.0–8.3)

## 2014-05-01 LAB — LIPID PANEL
CHOL/HDL RATIO: 4
Cholesterol: 199 mg/dL (ref 0–200)
HDL: 55.8 mg/dL (ref >39.00)
LDL Cholesterol: 125 mg/dL — ABNORMAL HIGH (ref 0–99)
NONHDL: 143.2
Triglycerides: 92 mg/dL (ref 0.0–149.0)
VLDL: 18.4 mg/dL (ref 0.0–40.0)

## 2014-05-01 LAB — TSH: TSH: 0.46 u[IU]/mL (ref 0.35–4.50)

## 2014-05-01 MED ORDER — BENAZEPRIL HCL 10 MG PO TABS: 10.0000 mg | ORAL_TABLET | Freq: Every day | ORAL | Status: DC

## 2014-05-01 MED ORDER — FLUTICASONE PROPIONATE 50 MCG/ACT NA SUSP: 2.0000 | Freq: Every day | NASAL | Status: DC

## 2014-05-01 MED ORDER — FUROSEMIDE 40 MG PO TABS: 40.0000 mg | ORAL_TABLET | Freq: Two times a day (BID) | ORAL | Status: DC

## 2014-05-01 MED ORDER — CARVEDILOL 12.5 MG PO TABS: 12.5000 mg | ORAL_TABLET | Freq: Two times a day (BID) | ORAL | Status: DC

## 2014-05-01 MED ORDER — ESTRADIOL 1 MG PO TABS: 1.0000 mg | ORAL_TABLET | Freq: Every day | ORAL | Status: DC

## 2014-05-01 MED ORDER — TIZANIDINE HCL 4 MG PO TABS: 4.0000 mg | ORAL_TABLET | Freq: Four times a day (QID) | ORAL | Status: DC | PRN

## 2014-05-01 NOTE — Assessment & Plan Note (Signed)
stable overall by history and exam, recent data reviewed with pt, and pt to continue medical treatment as before,  to f/u any worsening symptoms or concerns BP Readings from Last 3 Encounters:  05/01/14 112/60  03/31/14 106/60  09/27/13 116/70

## 2014-05-01 NOTE — Addendum Note (Signed)
Addended by: Sharon Seller B on: 05/01/2014 11:01 AM   Modules accepted: Orders

## 2014-05-01 NOTE — Assessment & Plan Note (Signed)

## 2014-05-01 NOTE — Progress Notes (Signed)
Pre visit review using our clinic review tool, if applicable. No additional management support is needed unless otherwise documented below in the visit note. 

## 2014-05-01 NOTE — Progress Notes (Signed)
Subjective:    Patient ID: [name] [name], female    DOB: 05-24-1941, 73 y.o.   MRN: [id number]  HPI  Here for wellness and f/u;  Overall doing ok;  Pt denies CP, worsening SOB, DOE, wheezing, orthopnea, PND, worsening LE edema, palpitations, dizziness or syncope.  Pt denies neurological change such as new headache, facial or extremity weakness.  Pt denies polydipsia, polyuria, or low sugar symptoms. Pt states overall good compliance with treatment and medications, good tolerability, and has been trying to follow lower cholesterol diet.  Pt denies worsening depressive symptoms, suicidal ideation or panic. No fever, night sweats, wt loss, loss of appetite, or other constitutional symptoms.  Pt states good ability with ADL's, has low fall risk, home safety reviewed and adequate, no other significant changes in hearing or vision, and only occasionally active with exercise. Did see an alergist with her son, sample med seemed to work well for her, but cant remember name today, has bottle at home.  Hsa ongoing right shoulder and bilat knee pain, s/p aicd so cannot have MRI. Getting cortisone shots but only works for a few days.  Needs to have shaved bone spurs to right shoulder.  No other new complaints Past Medical History  Diagnosis Date  . ALLERGIC RHINITIS 11/06/2007  . 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 ANEMIA-NOS 06/17/2008  . ANXIETY 02/22/2008  . CARDIOMYOPATHY, PRIMARY, DILATED 12/30/2008  . [place] DISEASE, 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  . GERD 05/07/2007  . HYPERLIPIDEMIA 05/23/2007  . HYPERTENSION 05/07/2007  . 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 MENOPAUSAL DISORDER 05/06/2008  . 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 OVERACTIVE BLADDER 08/03/2010  . PYELONEPHRITIS, HX OF 05/07/2007  . SYSTOLIC HEART FAILURE, CHRONIC 12/28/2008  . VITAMIN D DEFICIENCY 06/17/2008   Past Surgical History  Procedure Laterality Date  . Laminectomy    . Abdominal hysterectomy      reports that she  has never smoked. She does not have any smokeless tobacco history on file. She reports that she does not drink alcohol or use illicit drugs. family history includes Arthritis in her other; Coronary artery disease in her other. Allergies  Allergen Reactions  . Zocor [Simvastatin - High Dose] Nausea Only   Current Outpatient Prescriptions on File Prior to Visit  Medication Sig Dispense Refill  . Cholecalciferol (VITAMIN D3) 1000 UNITS CAPS Take by mouth daily.        . diclofenac (VOLTAREN) 75 MG EC tablet       . diclofenac (VOLTAREN) 75 MG EC tablet TAKE 1 TABLET BY MOUTH TWICE DAILY  60 tablet  2  . diphenhydrAMINE (BENADRYL) 25 MG tablet as needed.        [name] HYDROcodone-acetaminophen (NORCO/VICODIN) 5-325 MG per tablet TAKE 1 TABLET BY MOUTH EVERY 6 HOURS AS NEEDED FOR PAIN- to fill  Oct 04, 2013  120 tablet  0  . spironolactone (ALDACTONE) 25 MG tablet Take 1 tablet (25 mg total) by mouth daily.  90 tablet  3  . esomeprazole (NEXIUM) 40 MG capsule Take 1 capsule (40 mg total) by mouth daily.  90 capsule  3   No current facility-administered medications on file prior to visit.    Review of Systems Constitutional: Negative for increased diaphoresis, other activity, appetite or other siginficant weight change  HENT: Negative for worsening hearing loss, ear pain, facial swelling, mouth sores and neck stiffness.   Eyes: Negative for other worsening pain, redness or visual disturbance.  Respiratory: Negative for shortness of breath and  wheezing.   Cardiovascular: Negative for chest pain and palpitations.  Gastrointestinal: Negative for diarrhea, blood in stool, abdominal distention or other pain Genitourinary: Negative for hematuria, flank pain or change in urine volume.  Musculoskeletal: Negative for myalgias or other joint complaints.  Skin: Negative for color change and wound.  Neurological: Negative for syncope and numbness. other than noted Hematological: Negative for adenopathy. or  other swelling Psychiatric/Behavioral: Negative for hallucinations, self-injury, decreased concentration or other worsening agitation.      Objective:   Physical Exam BP 112/60  Pulse 75  Temp(Src) 98.2 F (36.8 C) (Oral)  Ht 5\' 7"  (1.702 m)  Wt 202 lb 8 oz (91.853 kg)  BMI 31.71 kg/m2  SpO2 98% VS noted,  Constitutional: Pt is oriented to person, place, and time. Appears well-developed and well-nourished.  Head: Normocephalic and atraumatic.  Right Ear: External ear normal.  Left Ear: External ear normal.  Nose: Nose normal.  Mouth/Throat: Oropharynx is clear and moist.  Eyes: Conjunctivae and EOM are normal. Pupils are equal, round, and reactive to light.  Neck: Normal range of motion. Neck supple. No JVD present. No tracheal deviation present.  Cardiovascular: Normal rate, regular rhythm, normal heart sounds and intact distal pulses.   Pulmonary/Chest: Effort normal and breath sounds without rales or wheezing  Abdominal: Soft. Bowel sounds are normal. NT. No HSM  Musculoskeletal: Normal range of motion. Exhibits no edema.  Lymphadenopathy:  Has no cervical adenopathy.  Neurological: Pt is alert and oriented to person, place, and time. Pt has normal reflexes. No cranial nerve deficit. Motor grossly intact Skin: Skin is warm and dry. No rash noted.  Psychiatric:  Has normal mood and affect. Behavior is normal.     Assessment & Plan:

## 2014-05-01 NOTE — Patient Instructions (Signed)
You had the "old" pneumovax shot today   Please continue all other medications as before, and refills have been done if requested.  Please have the pharmacy call with any other refills you may need.  Please continue your efforts at being more active, low cholesterol diet, and weight control.  You are otherwise up to date with prevention measures today.  Please keep your appointments with your specialists as you may have planned  Please go to the LAB in the Basement (turn left off the elevator) for the tests to be done today  You will be contacted by phone if any changes need to be made immediately.  Otherwise, you will receive a letter about your results with an explanation, but please check with MyChart first.  Please remember to sign up for MyChart if you have not done so, as this will be important to you in the future with finding out test results, communicating by private email, and scheduling acute appointments online when needed.  Please return in 6 months, or sooner if needed

## 2014-05-02 ENCOUNTER — Telehealth: Payer: Self-pay | Admitting: Internal Medicine

## 2014-05-02 NOTE — Telephone Encounter (Signed)
emmi emailed °

## 2014-05-09 ENCOUNTER — Ambulatory Visit (INDEPENDENT_AMBULATORY_CARE_PROVIDER_SITE_OTHER): Payer: Medicare Other | Admitting: Internal Medicine

## 2014-05-09 ENCOUNTER — Encounter: Payer: Self-pay | Admitting: Internal Medicine

## 2014-05-09 VITALS — BP 120/74 | HR 73 | Ht 67.0 in | Wt 204.8 lb

## 2014-05-09 DIAGNOSIS — I428 Other cardiomyopathies: Secondary | ICD-10-CM

## 2014-05-09 DIAGNOSIS — I5022 Chronic systolic (congestive) heart failure: Secondary | ICD-10-CM

## 2014-05-09 DIAGNOSIS — I471 Supraventricular tachycardia: Secondary | ICD-10-CM

## 2014-05-09 DIAGNOSIS — Z4502 Encounter for adjustment and management of automatic implantable cardiac defibrillator: Secondary | ICD-10-CM

## 2014-05-09 DIAGNOSIS — I38 Endocarditis, valve unspecified: Secondary | ICD-10-CM

## 2014-05-09 DIAGNOSIS — I493 Ventricular premature depolarization: Secondary | ICD-10-CM

## 2014-05-09 DIAGNOSIS — I429 Cardiomyopathy, unspecified: Secondary | ICD-10-CM

## 2014-05-09 LAB — MDC_IDC_ENUM_SESS_TYPE_INCLINIC
Battery Voltage: 2.9 V
Brady Statistic RV Percent Paced: 0 %
HIGH POWER IMPEDANCE MEASURED VALUE: 34 Ohm
HIGH POWER IMPEDANCE MEASURED VALUE: 34 Ohm
HIGH POWER IMPEDANCE MEASURED VALUE: 35 Ohm
HIGH POWER IMPEDANCE MEASURED VALUE: 35 Ohm
HIGH POWER IMPEDANCE MEASURED VALUE: 36 Ohm
HIGH POWER IMPEDANCE MEASURED VALUE: 39 Ohm
HIGH POWER IMPEDANCE MEASURED VALUE: 42 Ohm
HIGH POWER IMPEDANCE MEASURED VALUE: 43 Ohm
HIGH POWER IMPEDANCE MEASURED VALUE: 43 Ohm
HIGH POWER IMPEDANCE MEASURED VALUE: 43 Ohm
HIGH POWER IMPEDANCE MEASURED VALUE: 43 Ohm
HIGH POWER IMPEDANCE MEASURED VALUE: 44 Ohm
HIGH POWER IMPEDANCE MEASURED VALUE: 45 Ohm
HighPow Impedance: 34 Ohm
HighPow Impedance: 35 Ohm
HighPow Impedance: 35 Ohm
HighPow Impedance: 36 Ohm
HighPow Impedance: 36 Ohm
HighPow Impedance: 36 Ohm
HighPow Impedance: 37 Ohm
HighPow Impedance: 37 Ohm
HighPow Impedance: 38 Ohm
HighPow Impedance: 39 Ohm
HighPow Impedance: 43 Ohm
HighPow Impedance: 43 Ohm
HighPow Impedance: 44 Ohm
HighPow Impedance: 44 Ohm
HighPow Impedance: 44 Ohm
HighPow Impedance: 45 Ohm
HighPow Impedance: 45 Ohm
HighPow Impedance: 46 Ohm
Lead Channel Impedance Value: 424 Ohm
Lead Channel Impedance Value: 440 Ohm
Lead Channel Impedance Value: 440 Ohm
Lead Channel Impedance Value: 440 Ohm
Lead Channel Impedance Value: 448 Ohm
Lead Channel Impedance Value: 448 Ohm
Lead Channel Impedance Value: 464 Ohm
Lead Channel Impedance Value: 464 Ohm
Lead Channel Impedance Value: 472 Ohm
Lead Channel Impedance Value: 480 Ohm
Lead Channel Impedance Value: 504 Ohm
Lead Channel Pacing Threshold Amplitude: 1 V
Lead Channel Pacing Threshold Pulse Width: 0.4 ms
Lead Channel Sensing Intrinsic Amplitude: 13.8 mV
Lead Channel Sensing Intrinsic Amplitude: 14.6 mV
Lead Channel Sensing Intrinsic Amplitude: 15.4 mV
Lead Channel Sensing Intrinsic Amplitude: 15.6 mV
Lead Channel Sensing Intrinsic Amplitude: 15.7 mV
Lead Channel Sensing Intrinsic Amplitude: 15.8 mV
Lead Channel Sensing Intrinsic Amplitude: 15.9 mV
Lead Channel Sensing Intrinsic Amplitude: 15.9 mV
Lead Channel Setting Pacing Amplitude: 2.5 V
Lead Channel Setting Pacing Pulse Width: 0.4 ms
Lead Channel Setting Sensing Sensitivity: 0.3 mV
MDC IDC MSMT LEADCHNL RV IMPEDANCE VALUE: 432 Ohm
MDC IDC MSMT LEADCHNL RV IMPEDANCE VALUE: 472 Ohm
MDC IDC MSMT LEADCHNL RV IMPEDANCE VALUE: 480 Ohm
MDC IDC MSMT LEADCHNL RV IMPEDANCE VALUE: 488 Ohm
MDC IDC MSMT LEADCHNL RV SENSING INTR AMPL: 11.4 mV
MDC IDC MSMT LEADCHNL RV SENSING INTR AMPL: 12 mV
MDC IDC MSMT LEADCHNL RV SENSING INTR AMPL: 12.8 mV
MDC IDC MSMT LEADCHNL RV SENSING INTR AMPL: 15.2 mV
MDC IDC MSMT LEADCHNL RV SENSING INTR AMPL: 15.4 mV
MDC IDC MSMT LEADCHNL RV SENSING INTR AMPL: 16.2 mV
MDC IDC MSMT LEADCHNL RV SENSING INTR AMPL: 16.3 mV
MDC IDC SESS DTM: 20151030091059
MDC IDC SET ZONE DETECTION INTERVAL: 300 ms
Zone Setting Detection Interval: 240 ms
Zone Setting Detection Interval: 400 ms

## 2014-05-09 NOTE — Progress Notes (Signed)
Patient Care Team: Biagio Borg, MD as PCP - General   HPI  [name] [name] is a 73 y.o. female seen in followup for an ICD implanted 2008 for primary prevention in the setting of nonischemic heart disease. She has chronic systolic heart failure with a relatively narrow QRS and underwent single-chamber defibrillator implantation.   The patient denies chest pain,  nocturnal dyspnea, orthopnea or peripheral edema.  There have been no lightheadedness or syncope. She has had some dyspnea on exertion and has also noted increasing flutter. Potassium level was checked a couple of weeks ago and was normal    Myoview in November 2008 showed an ejection fraction of 34%. There was mild apical thinning and a mild degree of ischemia cannot be excluded. Low risk.  Last echocardiogram in March of 2015 showed an ejection fraction of 15-20%  ECG 2/15 demonstrated frequent PVCs.   Past Medical History  Diagnosis Date  . ALLERGIC RHINITIS 11/06/2007  . 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 ANEMIA-NOS 06/17/2008  . ANXIETY 02/22/2008  . CARDIOMYOPATHY, PRIMARY, DILATED 12/30/2008  . [place] DISEASE, 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  . GERD 05/07/2007  . HYPERLIPIDEMIA 05/23/2007  . HYPERTENSION 05/07/2007  . 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 MENOPAUSAL DISORDER 05/06/2008  . 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 OVERACTIVE BLADDER 08/03/2010  . PYELONEPHRITIS, HX OF 05/07/2007  . SYSTOLIC HEART FAILURE, CHRONIC 12/28/2008  . VITAMIN D DEFICIENCY 06/17/2008    Past Surgical History  Procedure Laterality Date  . Laminectomy    . Abdominal hysterectomy      Current Outpatient Prescriptions  Medication Sig Dispense Refill  . benazepril (LOTENSIN) 10 MG tablet Take 1 tablet (10 mg total) by mouth daily.  90 tablet  3  . carvedilol (COREG) 12.5 MG tablet Take 1 tablet (12.5 mg total) by mouth 2 (two) times daily.  180 tablet  3  . Cholecalciferol (VITAMIN D3) 1000 UNITS CAPS Take by mouth daily.         . diphenhydrAMINE (BENADRYL) 25 MG tablet as needed.        [name] esomeprazole (NEXIUM) 40 MG capsule Take 40 mg by mouth daily at 12 noon.      [name] estradiol (ESTRACE) 1 MG tablet Take 1 tablet (1 mg total) by mouth daily.  30 tablet  11  . fluticasone (FLONASE) 50 MCG/ACT nasal spray Place 2 sprays into both nostrils daily.  48 g  3  . furosemide (LASIX) 40 MG tablet Take 1 tablet (40 mg total) by mouth 2 (two) times daily.  180 tablet  3  . HYDROcodone-acetaminophen (NORCO/VICODIN) 5-325 MG per tablet TAKE 1 TABLET BY MOUTH EVERY 6 HOURS AS NEEDED FOR PAIN- to fill  Oct 04, 2013  120 tablet  0  . spironolactone (ALDACTONE) 25 MG tablet Take 1 tablet (25 mg total) by mouth daily.  90 tablet  3  . tiZANidine (ZANAFLEX) 4 MG tablet Take 1 tablet (4 mg total) by mouth every 6 (six) hours as needed.  60 tablet  4   No current facility-administered medications for this visit.    Allergies  Allergen Reactions  . 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 We will quantitate the accident. Morphology suggests a left ventricular inferomedial site. It may well be amenable to  ablation given the very rapid upstroke.   We will undertake a 24-hour Holter for quantitation. I will then review with Dr. London Sheer treatment options.   She is euvolemic, somewhat surprisingly.

## 2014-05-09 NOTE — Patient Instructions (Signed)
Your physician recommends that you continue on your current medications as directed. Please refer to the Current Medication list given to you today.  Your physician has recommended that you wear a 24 holter monitor.  Holter monitors are medical devices that record the heart's electrical activity. Doctors most often use these monitors to diagnose arrhythmias. Arrhythmias are problems with the speed or rhythm of the heartbeat. The monitor is a small, portable device. You can wear one while you do your normal daily activities. This is usually used to diagnose what is causing palpitations/syncope (passing out).  Please send a carelink transmission on August 12, 2014  Your physician wants you to follow-up in: 1 year with Dr. Caryl Comes.  You will receive a reminder letter in the mail two months in advance. If you don't receive a letter, please call our office to schedule the follow-up appointment.

## 2014-05-12 ENCOUNTER — Encounter: Payer: Self-pay | Admitting: Internal Medicine

## 2014-05-14 ENCOUNTER — Encounter: Payer: Self-pay | Admitting: *Deleted

## 2014-05-14 ENCOUNTER — Encounter (INDEPENDENT_AMBULATORY_CARE_PROVIDER_SITE_OTHER): Payer: Medicare Other

## 2014-05-14 DIAGNOSIS — I471 Supraventricular tachycardia, unspecified: Secondary | ICD-10-CM

## 2014-05-14 DIAGNOSIS — I493 Ventricular premature depolarization: Secondary | ICD-10-CM

## 2014-05-14 NOTE — Progress Notes (Signed)
Patient ID: [name] [name], female   DOB: 02/19/1941, 73 y.o.   MRN: [id number] [facility] 24 hour holter monitor applied to patient.

## 2014-05-15 ENCOUNTER — Other Ambulatory Visit: Payer: Self-pay | Admitting: Internal Medicine

## 2014-06-19 ENCOUNTER — Telehealth: Payer: Self-pay | Admitting: Internal Medicine

## 2014-06-19 MED ORDER — ALPRAZOLAM 0.25 MG PO TABS: 0.2500 mg | ORAL_TABLET | Freq: Two times a day (BID) | ORAL | Status: DC | PRN

## 2014-06-19 NOTE — Telephone Encounter (Signed)
Called pt no answer LMOM rx [facility].../lmb

## 2014-06-19 NOTE — Telephone Encounter (Signed)
Pt called back in with name of meds that she was wanting Dr Jenny Reichmann to call in for her?  Please advise if he can call this in?    Alprazolam (xanex)

## 2014-06-19 NOTE — Telephone Encounter (Signed)
Done hardcopy to D

## 2014-06-23 NOTE — Progress Notes (Signed)
HPI: FU nonischemic cardiomyopathy. Most recent nuclear study in November 2008 showed an ejection fraction of 34%. There was mild apical thinning and a mild degree of ischemia cannot be excluded. Low risk. We have treated her medically. 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 Last echocardiogram in March of 2015 showed an ejection fraction of 15-20% and mild to moderate mitral regurgitation. Holter monitor November 2015 showed sinus rhythm with PVCs, couplets and triplets.Since I last saw her, She has dyspnea on exertion but no orthopnea, PND or pedal edema. She has chest pain both at rest and with exertion. She has dizziness with standing. No syncope.  Current Outpatient Prescriptions  Medication Sig Dispense Refill  . ALPRAZolam (XANAX) 0.25 MG tablet Take 1 tablet (0.25 mg total) by mouth 2 (two) times daily as needed for anxiety. 60 tablet 1  . benazepril (LOTENSIN) 10 MG tablet Take 1 tablet (10 mg total) by mouth daily. 90 tablet 3  . carvedilol (COREG) 12.5 MG tablet Take 1 tablet (12.5 mg total) by mouth 2 (two) times daily. 180 tablet 3  . Cholecalciferol (VITAMIN D3) 1000 UNITS CAPS Take by mouth daily.      . diphenhydrAMINE (BENADRYL) 25 MG tablet as needed.      [name] esomeprazole (NEXIUM) 40 MG capsule Take 40 mg by mouth daily at 12 noon.    [name] estradiol (ESTRACE) 1 MG tablet Take 1 tablet (1 mg total) by mouth daily. 30 tablet 11  . estradiol (ESTRACE) 1 MG tablet TAKE 1 TABLET BY MOUTH DAILY 90 tablet 3  . fluticasone (FLONASE) 50 MCG/ACT nasal spray Place 2 sprays into both nostrils daily. 48 g 3  . furosemide (LASIX) 40 MG tablet Take 1 tablet (40 mg total) by mouth 2 (two) times daily. 180 tablet 3  . HYDROcodone-acetaminophen (NORCO/VICODIN) 5-325 MG per tablet TAKE 1 TABLET BY MOUTH EVERY 6 HOURS AS NEEDED FOR PAIN- to fill  Oct 04, 2013 120 tablet 0  . spironolactone (ALDACTONE) 25 MG tablet Take 1 tablet (25 mg total) by mouth daily. 90 tablet 3  . tiZANidine  (ZANAFLEX) 4 MG tablet Take 1 tablet (4 mg total) by mouth every 6 (six) hours as needed. 60 tablet 4   No current facility-administered medications for this visit.     Past Medical History  Diagnosis Date  . ALLERGIC RHINITIS 11/06/2007  . 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 ANEMIA-NOS 06/17/2008  . ANXIETY 02/22/2008  . CARDIOMYOPATHY, PRIMARY, DILATED 12/30/2008  . [place] DISEASE, 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  . GERD 05/07/2007  . HYPERLIPIDEMIA 05/23/2007  . HYPERTENSION 05/07/2007  . 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 MENOPAUSAL DISORDER 05/06/2008  . 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 OVERACTIVE BLADDER 08/03/2010  . PYELONEPHRITIS, HX OF 05/07/2007  . SYSTOLIC HEART FAILURE, CHRONIC 12/28/2008  . VITAMIN D DEFICIENCY 06/17/2008    Past Surgical History  Procedure Laterality Date  . Laminectomy    . Abdominal hysterectomy      History   Social History  . Marital Status: Widowed    Spouse Name: N/A    Number of Children: 3  . Years of Education: N/A   Occupational History  . 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 Smoking status: Never Smoker   . Smokeless tobacco: Not on file  . Alcohol Use: No  . Drug Use: No  . Sexual Activity: Not on file   Other Topics Concern  . Not on file  Social History Narrative    ROS: no fevers or chills, productive cough, hemoptysis, dysphasia, odynophagia, melena, hematochezia, dysuria, hematuria, rash, seizure activity, orthopnea, PND, pedal edema, claudication. Remaining systems are negative.  Physical Exam: Well-developed well-nourished in no acute distress.  Skin is warm and dry.  HEENT is normal.  Neck is supple.  Chest is clear to auscultation with normal expansion.  Cardiovascular exam is regular rate and rhythm.  Abdominal exam nontender or distended. No masses palpated. Extremities show no edema. neuro grossly intact  ECG     This encounter was created in error - please  disregard.

## 2014-06-27 ENCOUNTER — Encounter: Payer: Medicare Other | Admitting: Cardiology

## 2014-07-17 ENCOUNTER — Encounter: Payer: Medicare Other | Admitting: Internal Medicine

## 2014-07-23 ENCOUNTER — Encounter: Payer: Self-pay | Admitting: Internal Medicine

## 2014-07-23 ENCOUNTER — Ambulatory Visit (INDEPENDENT_AMBULATORY_CARE_PROVIDER_SITE_OTHER): Payer: Medicare Other | Admitting: Internal Medicine

## 2014-07-23 VITALS — BP 114/56 | HR 63 | Ht 67.0 in | Wt 210.4 lb

## 2014-07-23 DIAGNOSIS — I10 Essential (primary) hypertension: Secondary | ICD-10-CM

## 2014-07-23 DIAGNOSIS — I5022 Chronic systolic (congestive) heart failure: Secondary | ICD-10-CM | POA: Diagnosis not present

## 2014-07-23 DIAGNOSIS — I471 Supraventricular tachycardia, unspecified: Secondary | ICD-10-CM

## 2014-07-23 DIAGNOSIS — Z95810 Presence of automatic (implantable) cardiac defibrillator: Secondary | ICD-10-CM | POA: Diagnosis not present

## 2014-07-23 DIAGNOSIS — I493 Ventricular premature depolarization: Secondary | ICD-10-CM | POA: Diagnosis not present

## 2014-07-23 LAB — MDC_IDC_ENUM_SESS_TYPE_INCLINIC
Battery Voltage: 2.85 V
Brady Statistic RV Percent Paced: 0 %
Date Time Interrogation Session: 20160113104710
HighPow Impedance: 34 Ohm
HighPow Impedance: 36 Ohm
HighPow Impedance: 36 Ohm
HighPow Impedance: 36 Ohm
HighPow Impedance: 36 Ohm
HighPow Impedance: 37 Ohm
HighPow Impedance: 37 Ohm
HighPow Impedance: 37 Ohm
HighPow Impedance: 37 Ohm
HighPow Impedance: 37 Ohm
HighPow Impedance: 38 Ohm
HighPow Impedance: 38 Ohm
HighPow Impedance: 38 Ohm
HighPow Impedance: 38 Ohm
HighPow Impedance: 38 Ohm
HighPow Impedance: 39 Ohm
HighPow Impedance: 41 Ohm
HighPow Impedance: 43 Ohm
HighPow Impedance: 43 Ohm
HighPow Impedance: 44 Ohm
HighPow Impedance: 44 Ohm
HighPow Impedance: 44 Ohm
HighPow Impedance: 45 Ohm
HighPow Impedance: 45 Ohm
HighPow Impedance: 45 Ohm
HighPow Impedance: 46 Ohm
HighPow Impedance: 46 Ohm
HighPow Impedance: 46 Ohm
HighPow Impedance: 46 Ohm
HighPow Impedance: 47 Ohm
HighPow Impedance: 49 Ohm
Lead Channel Impedance Value: 424 Ohm
Lead Channel Impedance Value: 432 Ohm
Lead Channel Impedance Value: 448 Ohm
Lead Channel Impedance Value: 448 Ohm
Lead Channel Impedance Value: 448 Ohm
Lead Channel Impedance Value: 456 Ohm
Lead Channel Impedance Value: 456 Ohm
Lead Channel Impedance Value: 464 Ohm
Lead Channel Impedance Value: 464 Ohm
Lead Channel Impedance Value: 488 Ohm
Lead Channel Impedance Value: 488 Ohm
Lead Channel Impedance Value: 488 Ohm
Lead Channel Impedance Value: 496 Ohm
Lead Channel Impedance Value: 496 Ohm
Lead Channel Impedance Value: 504 Ohm
Lead Channel Pacing Threshold Amplitude: 1 V
Lead Channel Pacing Threshold Pulse Width: 0.3 ms
Lead Channel Sensing Intrinsic Amplitude: 12.9 mV
Lead Channel Sensing Intrinsic Amplitude: 13.1 mV
Lead Channel Sensing Intrinsic Amplitude: 14.2 mV
Lead Channel Sensing Intrinsic Amplitude: 14.6 mV
Lead Channel Sensing Intrinsic Amplitude: 15.1 mV
Lead Channel Sensing Intrinsic Amplitude: 15.1 mV
Lead Channel Sensing Intrinsic Amplitude: 15.2 mV
Lead Channel Sensing Intrinsic Amplitude: 15.4 mV
Lead Channel Sensing Intrinsic Amplitude: 15.5 mV
Lead Channel Sensing Intrinsic Amplitude: 16 mV
Lead Channel Sensing Intrinsic Amplitude: 16.2 mV
Lead Channel Sensing Intrinsic Amplitude: 16.3 mV
Lead Channel Sensing Intrinsic Amplitude: 16.3 mV
Lead Channel Sensing Intrinsic Amplitude: 16.4 mV
Lead Channel Sensing Intrinsic Amplitude: 16.7 mV
Lead Channel Setting Pacing Amplitude: 2.5 V
Lead Channel Setting Pacing Pulse Width: 0.4 ms
Lead Channel Setting Sensing Sensitivity: 0.3 mV
Zone Setting Detection Interval: 240 ms
Zone Setting Detection Interval: 300 ms
Zone Setting Detection Interval: 400 ms

## 2014-07-23 NOTE — Assessment & Plan Note (Signed)
Her symptoms are class 2. No change in meds.

## 2014-07-23 NOTE — Assessment & Plan Note (Signed)
Her blood pressure is well controlled. We discussed the importance of a low sodium diet.

## 2014-07-23 NOTE — Assessment & Plan Note (Signed)
Her medtronic  ICD is working normally. Will recheck in several months.

## 2014-07-23 NOTE — Patient Instructions (Addendum)
Follow up with Dr. Lovena Le as needed.   Your physician recommends that you continue on your current medications as directed. Please refer to the Current Medication list given to you today.  Remote monitoring is used to monitor your Pacemaker or ICD from home. This monitoring reduces the number of office visits required to check your device to one time per year. It allows [place] to keep an eye on the functioning of your device to ensure it is working properly. You are scheduled for a device check from home on 4.13.2016. You may send your transmission at any time that day. If you have a wireless device, the transmission will be sent automatically. After your physician reviews your transmission, you will receive a postcard with your next transmission date.

## 2014-07-23 NOTE — Progress Notes (Signed)
HPI [name] is referred today by Dr. Caryl Comes to consider catheter ablation. The patient is a very pleasant 74 yo woman with HTN and chronic systolic heart failure, s/p ICD implant. When she saw Dr. Caryl Comes several months ago, she was quite stressed and not sleeping well. She had palpitations which had worsened. Subsequent evaluation demonstrated that he EF had gone down and she 11k PVC's in a 24 hour period. Since then her palpitations have improved as her living situation has gotten better. She is sleeping better and less stressed. Her palpitations have improved as well.  Allergies  Allergen Reactions  . Zocor [Simvastatin - High Dose] Nausea Only    Nausea      Current Outpatient Prescriptions  Medication Sig Dispense Refill  . ALPRAZolam (XANAX) 0.25 MG tablet Take 1 tablet (0.25 mg total) by mouth 2 (two) times daily as needed for anxiety. 60 tablet 1  . benazepril (LOTENSIN) 10 MG tablet Take 1 tablet (10 mg total) by mouth daily. 90 tablet 3  . carvedilol (COREG) 12.5 MG tablet Take 1 tablet (12.5 mg total) by mouth 2 (two) times daily. 180 tablet 3  . Cholecalciferol (VITAMIN D3) 1000 UNITS CAPS Take 1 capsule by mouth daily.     . diphenhydrAMINE (BENADRYL) 25 MG tablet Take 25 mg by mouth every 6 (six) hours as needed for allergies.     [name] esomeprazole (NEXIUM) 40 MG capsule Take 40 mg by mouth daily at 12 noon.    [name] estradiol (ESTRACE) 1 MG tablet Take 1 tablet (1 mg total) by mouth daily. 30 tablet 11  . estradiol (ESTRACE) 1 MG tablet TAKE 1 TABLET BY MOUTH DAILY 90 tablet 3  . fluticasone (FLONASE) 50 MCG/ACT nasal spray Place 2 sprays into both nostrils daily. 48 g 3  . furosemide (LASIX) 40 MG tablet Take 1 tablet (40 mg total) by mouth 2 (two) times daily. 180 tablet 3  . HYDROcodone-acetaminophen (NORCO/VICODIN) 5-325 MG per tablet TAKE 1 TABLET BY MOUTH EVERY 6 HOURS AS NEEDED FOR PAIN- to fill  Oct 04, 2013 120 tablet 0  . spironolactone (ALDACTONE) 25 MG tablet  Take 1 tablet (25 mg total) by mouth daily. 90 tablet 3  . tiZANidine (ZANAFLEX) 4 MG tablet Take 1 tablet (4 mg total) by mouth every 6 (six) hours as needed. (Patient taking differently: Take 4 mg by mouth every 6 (six) hours as needed for muscle spasms. ) 60 tablet 4   No current facility-administered medications for this visit.     Past Medical History  Diagnosis Date  . ALLERGIC RHINITIS 11/06/2007  . 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 ANEMIA-NOS 06/17/2008  . ANXIETY 02/22/2008  . CARDIOMYOPATHY, PRIMARY, DILATED 12/30/2008  . [place] DISEASE, 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  . GERD 05/07/2007  . HYPERLIPIDEMIA 05/23/2007  . HYPERTENSION 05/07/2007  . 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 MENOPAUSAL DISORDER 05/06/2008  . 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 OVERACTIVE BLADDER 08/03/2010  . PYELONEPHRITIS, HX OF 05/07/2007  . SYSTOLIC HEART FAILURE, CHRONIC 12/28/2008  . VITAMIN D DEFICIENCY 06/17/2008    ROS:   All systems reviewed and negative except as noted in the HPI.   Past Surgical History  Procedure Laterality Date  . Laminectomy    . Abdominal hysterectomy       Family History  Problem Relation Age of Onset  . Arthritis Other   . Coronary artery disease Other   . Heart disease Mother  History   Social History  . Marital Status: Widowed    Spouse Name: N/A    Number of Children: 3  . Years of Education: N/A   Occupational History  . 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 Smoking status: Never Smoker   . Smokeless tobacco: Not on file  . Alcohol Use: No  . Drug Use: No  . Sexual Activity: Not on file   Other Topics Concern  . Not on file   Social History Narrative     BP 114/56 mmHg  Pulse 63  Ht 5\' 7"  (1.702 m)  Wt 210 lb 6.4 oz (95.437 kg)  BMI 32.95 kg/m2  Physical Exam:  Well appearing 74 yo woman, NAD HEENT: Unremarkable Neck:  No JVD, no thyromegally Lymphatics:  No adenopathy Back:  No CVA  tenderness Lungs:  Clear with no wheezes HEART:  Regular rate rhythm, no murmurs, no rubs, no clicks Abd:  soft, positive bowel sounds, no organomegally, no rebound, no guarding Ext:  2 plus pulses, no edema, no cyanosis, no clubbing Skin:  No rashes no nodules Neuro:  CN II through XII intact, motor grossly intact  EKG - NSR  Cardiac monitor - reviewed. Mostly monomorphic PVC's.  Assess/Plan:

## 2014-07-23 NOTE — Assessment & Plan Note (Signed)
I have discussed the treatment options with the patient. Because her symptoms have markedly improved, I would not recommend proceeding with ablation at this time. My concern is that we will not be able to see enough PVC's to successfully map out her arrhythmia. No change in meds. I have recommended watchful waiting.

## 2014-08-05 ENCOUNTER — Other Ambulatory Visit: Payer: Self-pay | Admitting: Internal Medicine

## 2014-09-04 ENCOUNTER — Other Ambulatory Visit: Payer: Self-pay | Admitting: Internal Medicine

## 2014-09-04 DIAGNOSIS — I5022 Chronic systolic (congestive) heart failure: Secondary | ICD-10-CM

## 2014-09-04 MED ORDER — BENAZEPRIL HCL 10 MG PO TABS: 10.0000 mg | ORAL_TABLET | Freq: Every day | ORAL | Status: DC

## 2014-09-05 ENCOUNTER — Other Ambulatory Visit: Payer: Self-pay | Admitting: Internal Medicine

## 2014-09-05 DIAGNOSIS — I5022 Chronic systolic (congestive) heart failure: Secondary | ICD-10-CM

## 2014-09-05 MED ORDER — BENAZEPRIL HCL 10 MG PO TABS: 10.0000 mg | ORAL_TABLET | Freq: Every day | ORAL | Status: DC

## 2014-10-02 ENCOUNTER — Telehealth: Payer: Self-pay | Admitting: Internal Medicine

## 2014-10-02 NOTE — Telephone Encounter (Signed)
Patient is requesting tetanus/tdap.  Please advise.

## 2014-10-02 NOTE — Telephone Encounter (Signed)
[place] for Nurse Visit for Tdap as this should be covered by [facility]

## 2014-10-07 NOTE — Telephone Encounter (Signed)
Included on next visit.

## 2014-10-22 ENCOUNTER — Ambulatory Visit (INDEPENDENT_AMBULATORY_CARE_PROVIDER_SITE_OTHER): Payer: Medicare Other | Admitting: *Deleted

## 2014-10-22 DIAGNOSIS — I429 Cardiomyopathy, unspecified: Secondary | ICD-10-CM | POA: Diagnosis not present

## 2014-10-22 DIAGNOSIS — I471 Supraventricular tachycardia: Secondary | ICD-10-CM | POA: Diagnosis not present

## 2014-10-22 DIAGNOSIS — I428 Other cardiomyopathies: Secondary | ICD-10-CM

## 2014-10-23 NOTE — Progress Notes (Signed)
Remote ICD transmission.   

## 2014-10-31 ENCOUNTER — Encounter: Payer: Self-pay | Admitting: Internal Medicine

## 2014-10-31 ENCOUNTER — Ambulatory Visit (INDEPENDENT_AMBULATORY_CARE_PROVIDER_SITE_OTHER): Payer: Medicare Other | Admitting: Internal Medicine

## 2014-10-31 VITALS — BP 116/68 | HR 75 | Temp 98.6°F | Ht 67.0 in | Wt 209.2 lb

## 2014-10-31 DIAGNOSIS — J309 Allergic rhinitis, unspecified: Secondary | ICD-10-CM

## 2014-10-31 DIAGNOSIS — Z0000 Encounter for general adult medical examination without abnormal findings: Secondary | ICD-10-CM

## 2014-10-31 DIAGNOSIS — E785 Hyperlipidemia, unspecified: Secondary | ICD-10-CM | POA: Diagnosis not present

## 2014-10-31 DIAGNOSIS — Z0189 Encounter for other specified special examinations: Secondary | ICD-10-CM

## 2014-10-31 DIAGNOSIS — I10 Essential (primary) hypertension: Secondary | ICD-10-CM | POA: Diagnosis not present

## 2014-10-31 DIAGNOSIS — I5022 Chronic systolic (congestive) heart failure: Secondary | ICD-10-CM | POA: Diagnosis not present

## 2014-10-31 LAB — MDC_IDC_ENUM_SESS_TYPE_REMOTE
Brady Statistic RV Percent Paced: 0 %
Date Time Interrogation Session: 20160413110700
HIGH POWER IMPEDANCE MEASURED VALUE: 39 Ohm
Lead Channel Setting Pacing Amplitude: 2.5 V
Lead Channel Setting Sensing Sensitivity: 0.3 mV
MDC IDC MSMT BATTERY VOLTAGE: 2.81 V
MDC IDC MSMT LEADCHNL RV IMPEDANCE VALUE: 432 Ohm
MDC IDC MSMT LEADCHNL RV SENSING INTR AMPL: 13.3 mV
MDC IDC SET LEADCHNL RV PACING PULSEWIDTH: 0.4 ms
MDC IDC SET ZONE DETECTION INTERVAL: 300 ms
Zone Setting Detection Interval: 240 ms
Zone Setting Detection Interval: 400 ms

## 2014-10-31 MED ORDER — FUROSEMIDE 40 MG PO TABS: 40.0000 mg | ORAL_TABLET | Freq: Two times a day (BID) | ORAL | Status: DC

## 2014-10-31 MED ORDER — CARVEDILOL 12.5 MG PO TABS: 12.5000 mg | ORAL_TABLET | Freq: Two times a day (BID) | ORAL | Status: DC

## 2014-10-31 MED ORDER — ALPRAZOLAM 0.25 MG PO TABS: 0.2500 mg | ORAL_TABLET | Freq: Two times a day (BID) | ORAL | Status: DC | PRN

## 2014-10-31 MED ORDER — CETIRIZINE HCL 10 MG PO TABS: 10.0000 mg | ORAL_TABLET | Freq: Every day | ORAL | Status: DC

## 2014-10-31 MED ORDER — ESTRADIOL 1 MG PO TABS: 1.0000 mg | ORAL_TABLET | Freq: Every day | ORAL | Status: DC

## 2014-10-31 MED ORDER — TIZANIDINE HCL 4 MG PO TABS: 4.0000 mg | ORAL_TABLET | Freq: Four times a day (QID) | ORAL | Status: DC | PRN

## 2014-10-31 MED ORDER — DICLOFENAC SODIUM 75 MG PO TBEC: 75.0000 mg | DELAYED_RELEASE_TABLET | Freq: Two times a day (BID) | ORAL | Status: DC

## 2014-10-31 MED ORDER — TRIAMCINOLONE ACETONIDE 55 MCG/ACT NA AERO: 2.0000 | INHALATION_SPRAY | Freq: Every day | NASAL | Status: DC

## 2014-10-31 NOTE — Progress Notes (Signed)
Pre visit review using our clinic review tool, if applicable. No additional management support is needed unless otherwise documented below in the visit note. 

## 2014-10-31 NOTE — Patient Instructions (Addendum)
Please take all new medication as prescribed - the zyrtec and the nasacort  OK to stop the flonase  Please continue all other medications as before, and refills have been done if requested.  Please have the pharmacy call with any other refills you may need.  Please continue your efforts at being more active, low cholesterol diet, and weight control.  Please keep your appointments with your specialists as you may have planned  Please return in 6 months, or sooner if needed, with Lab testing done 3-5 days before

## 2014-10-31 NOTE — Progress Notes (Signed)
Subjective:    Patient ID: [name] [name], female    DOB: 1940-09-16, 74 y.o.   MRN: [id number]  HPI   Here to f/u; overall doing ok,  Pt denies chest pain, increasing sob or doe, wheezing, orthopnea, PND, increased LE swelling, palpitations, dizziness or syncope.  Pt denies new neurological symptoms such as new headache, or facial or extremity weakness or numbness.  Pt denies polydipsia, polyuria, or low sugar episode.   Pt denies new neurological symptoms such as new headache, or facial or extremity weakness or numbness.   Pt states overall good compliance with meds, mostly trying to follow appropriate diet, with wt overall stable,  but little exercise however.\  Does have several wks ongoing nasal allergy symptoms with clearish congestion, itch and sneezing, without fever, pain, ST, cough, swelling or wheezing.  Flonase caused few nosebleeds, Father died [place] 10/27/2022, she was primary caretaker.   Past Medical History  Diagnosis Date  . ALLERGIC RHINITIS 11/06/2007  . 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 ANEMIA-NOS 06/17/2008  . ANXIETY 02/22/2008  . CARDIOMYOPATHY, PRIMARY, DILATED 12/30/2008  . [place] DISEASE, 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  . GERD 05/07/2007  . HYPERLIPIDEMIA 05/23/2007  . HYPERTENSION 05/07/2007  . 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 MENOPAUSAL DISORDER 05/06/2008  . 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 OVERACTIVE BLADDER 08/03/2010  . PYELONEPHRITIS, HX OF 05/07/2007  . SYSTOLIC HEART FAILURE, CHRONIC 12/28/2008  . VITAMIN D DEFICIENCY 06/17/2008   Past Surgical History  Procedure Laterality Date  . Laminectomy    . Abdominal hysterectomy      reports that she has never smoked. She does not have any smokeless tobacco history on file. She reports that she does not drink alcohol or use illicit drugs. family history includes Arthritis in her other; Coronary artery disease in her other; Heart disease in her mother. Allergies  Allergen Reactions  . Zocor  [Simvastatin - High Dose] Nausea Only    Nausea    Current Outpatient Prescriptions on File Prior to Visit  Medication Sig Dispense Refill  . ALPRAZolam (XANAX) 0.25 MG tablet Take 1 tablet (0.25 mg total) by mouth 2 (two) times daily as needed for anxiety. 60 tablet 1  . benazepril (LOTENSIN) 10 MG tablet Take 1 tablet (10 mg total) by mouth daily. 30 tablet 10  . carvedilol (COREG) 12.5 MG tablet Take 1 tablet (12.5 mg total) by mouth 2 (two) times daily. 180 tablet 3  . Cholecalciferol (VITAMIN D3) 1000 UNITS CAPS Take 1 capsule by mouth daily.     . diclofenac (VOLTAREN) 75 MG EC tablet TAKE 1 TABLET BY MOUTH TWICE DAILY 60 tablet 2  . diphenhydrAMINE (BENADRYL) 25 MG tablet Take 25 mg by mouth every 6 (six) hours as needed for allergies.     [name] esomeprazole (NEXIUM) 40 MG capsule Take 40 mg by mouth daily at 12 noon.    [name] estradiol (ESTRACE) 1 MG tablet Take 1 tablet (1 mg total) by mouth daily. 30 tablet 11  . fluticasone (FLONASE) 50 MCG/ACT nasal spray Place 2 sprays into both nostrils daily. 48 g 3  . furosemide (LASIX) 40 MG tablet Take 1 tablet (40 mg total) by mouth 2 (two) times daily. 180 tablet 3  . HYDROcodone-acetaminophen (NORCO/VICODIN) 5-325 MG per tablet TAKE 1 TABLET BY MOUTH EVERY 6 HOURS AS NEEDED FOR PAIN- to fill  Oct 04, 2013 120 tablet 0  . spironolactone (ALDACTONE) 25 MG tablet Take 1 tablet (25 mg total) by mouth daily. [place]  tablet 3  . tiZANidine (ZANAFLEX) 4 MG tablet Take 1 tablet (4 mg total) by mouth every 6 (six) hours as needed. (Patient taking differently: Take 4 mg by mouth every 6 (six) hours as needed for muscle spasms. ) 60 tablet 4   No current facility-administered medications on file prior to visit.    Review of Systems  Constitutional: Negative for unusual diaphoresis or night sweats HENT: Negative for ringing in ear or discharge Eyes: Negative for double vision or worsening visual disturbance.  Respiratory: Negative for choking and stridor.     Gastrointestinal: Negative for vomiting or other signifcant bowel change Genitourinary: Negative for hematuria or change in urine volume.  Musculoskeletal: Negative for other MSK pain or swelling Skin: Negative for color change and worsening wound.  Neurological: Negative for tremors and numbness other than noted  Psychiatric/Behavioral: Negative for decreased concentration or agitation other than above       Objective:   Physical Exam BP 116/68 mmHg  Pulse 75  Temp(Src) 98.6 F (37 C) (Oral)  Ht 5\' 7"  (1.702 m)  Wt 209 lb 4 oz (94.915 kg)  BMI 32.77 kg/m2  SpO2 97% VS noted,  Constitutional: Pt appears in no significant distress HENT: Head: NCAT.  Right Ear: External ear normal.  Left Ear: External ear normal.  Eyes: . Pupils are equal, round, and reactive to light. Conjunctivae and EOM are normal Neck: Normal range of motion. Neck supple.  Cardiovascular: Normal rate and regular rhythm.   Pulmonary/Chest: Effort normal and breath sounds without rales or wheezing.  Abd:  Soft, NT, ND, + BS Neurological: Pt is alert. Not confused , motor grossly intact Skin: Skin is warm. No rash, no LE edema Psychiatric: Pt behavior is normal. No agitation.     Assessment & Plan:

## 2014-11-03 ENCOUNTER — Telehealth: Payer: Self-pay | Admitting: Internal Medicine

## 2014-11-03 MED ORDER — TRIAMCINOLONE ACETONIDE 55 MCG/ACT NA AERO: 2.0000 | INHALATION_SPRAY | Freq: Every day | NASAL | Status: DC

## 2014-11-03 NOTE — Telephone Encounter (Signed)
[facility]

## 2014-11-03 NOTE — Telephone Encounter (Signed)
Please send prescription triamcinolone (NASACORT AQ) 55 MCG/ACT AERO nasal inhaler [id number]  To [facility] [address] In [place]

## 2014-11-08 NOTE — Assessment & Plan Note (Signed)
stable overall by history and exam, recent data reviewed with pt, and pt to continue medical treatment as before,  to f/u any worsening symptoms or concerns Lab Results  Component Value Date   LDLCALC 125* 05/01/2014   For lower chol diet, declines other change

## 2014-11-08 NOTE — Assessment & Plan Note (Signed)
/  stable overall by history and exam, volume stable, recent data reviewed with pt, and pt to continue medical treatment as before,  to f/u any worsening symptoms or concerns

## 2014-11-08 NOTE — Assessment & Plan Note (Signed)
To d/c flonase, for zyrtec/nasacort asd,  to f/u any worsening symptoms or concerns

## 2014-11-08 NOTE — Assessment & Plan Note (Signed)
stable overall by history and exam, recent data reviewed with pt, and pt to continue medical treatment as before,  to f/u any worsening symptoms or concerns BP Readings from Last 3 Encounters:  10/31/14 116/68  07/23/14 114/56  05/09/14 120/74

## 2014-11-10 DIAGNOSIS — Z1231 Encounter for screening mammogram for malignant neoplasm of breast: Secondary | ICD-10-CM | POA: Diagnosis not present

## 2014-11-28 ENCOUNTER — Encounter: Payer: Self-pay | Admitting: Cardiology

## 2014-12-01 NOTE — Telephone Encounter (Signed)
LMOVM for pt to return call 

## 2014-12-02 ENCOUNTER — Encounter: Payer: Self-pay | Admitting: Internal Medicine

## 2014-12-02 NOTE — Telephone Encounter (Signed)
Informed pt that ICD is not wireless and she has to send manual transmission. Pt verbalized understanding.

## 2014-12-12 DIAGNOSIS — H52223 Regular astigmatism, bilateral: Secondary | ICD-10-CM | POA: Diagnosis not present

## 2014-12-12 DIAGNOSIS — H5203 Hypermetropia, bilateral: Secondary | ICD-10-CM | POA: Diagnosis not present

## 2014-12-12 DIAGNOSIS — H524 Presbyopia: Secondary | ICD-10-CM | POA: Diagnosis not present

## 2015-01-09 ENCOUNTER — Encounter: Payer: Self-pay | Admitting: Internal Medicine

## 2015-01-22 ENCOUNTER — Telehealth: Payer: Self-pay | Admitting: Internal Medicine

## 2015-01-22 ENCOUNTER — Encounter: Payer: Medicare Other | Admitting: *Deleted

## 2015-01-22 NOTE — Telephone Encounter (Signed)
New Message  Pt calling to speak w/ Device. Pt had remote check today (7/14) and was not able to send. Pt stated she spoke w/ [employer] and is now wanting to speak w/ our office about bringing equipment in. Please call back and discuss.

## 2015-01-22 NOTE — Telephone Encounter (Signed)
Pt going to come to office to receive help w/ home monitor.

## 2015-01-23 ENCOUNTER — Telehealth: Payer: Self-pay

## 2015-01-23 NOTE — Telephone Encounter (Signed)
Pt is to have an annual (AWV/CPE) in October. However, it is slotted for a follow up. Opening for the same day at 1pm.  Spoke to pt and rescheduled with PCP and Manuela Schwartz.

## 2015-01-26 ENCOUNTER — Encounter: Payer: Self-pay | Admitting: Cardiology

## 2015-02-24 ENCOUNTER — Other Ambulatory Visit: Payer: Self-pay

## 2015-02-24 DIAGNOSIS — I5022 Chronic systolic (congestive) heart failure: Secondary | ICD-10-CM

## 2015-02-24 NOTE — Telephone Encounter (Signed)
Medication Detail      Disp Refills Start End     spironolactone (ALDACTONE) 25 MG tablet 90 tablet 3 03/31/2014     Sig - Route: Take 1 tablet (25 mg total) by mouth daily. - Oral    E-Prescribing Status: Receipt confirmed by pharmacy (03/31/2014 10:59 AM EDT)

## 2015-02-25 ENCOUNTER — Other Ambulatory Visit: Payer: Self-pay

## 2015-02-25 DIAGNOSIS — I5022 Chronic systolic (congestive) heart failure: Secondary | ICD-10-CM

## 2015-02-25 MED ORDER — SPIRONOLACTONE 25 MG PO TABS: 25.0000 mg | ORAL_TABLET | Freq: Every day | ORAL | Status: DC

## 2015-03-02 ENCOUNTER — Other Ambulatory Visit: Payer: Self-pay | Admitting: Internal Medicine

## 2015-03-06 ENCOUNTER — Encounter: Payer: Self-pay | Admitting: *Deleted

## 2015-03-27 ENCOUNTER — Ambulatory Visit (INDEPENDENT_AMBULATORY_CARE_PROVIDER_SITE_OTHER): Payer: Medicare Other | Admitting: *Deleted

## 2015-03-27 DIAGNOSIS — I429 Cardiomyopathy, unspecified: Secondary | ICD-10-CM | POA: Diagnosis not present

## 2015-03-27 DIAGNOSIS — I428 Other cardiomyopathies: Secondary | ICD-10-CM

## 2015-03-30 NOTE — Progress Notes (Signed)
Remote ICD transmission.   

## 2015-04-05 ENCOUNTER — Other Ambulatory Visit: Payer: Self-pay | Admitting: Cardiology

## 2015-04-06 LAB — CUP PACEART REMOTE DEVICE CHECK
Date Time Interrogation Session: 20160916170800
HighPow Impedance: 39 Ohm
Lead Channel Impedance Value: 456 Ohm
Lead Channel Sensing Intrinsic Amplitude: 13.1 mV
Lead Channel Setting Pacing Amplitude: 2.5 V
Lead Channel Setting Pacing Pulse Width: 0.4 ms
Lead Channel Setting Sensing Sensitivity: 0.3 mV
MDC IDC MSMT BATTERY VOLTAGE: 2.71 V
MDC IDC SET ZONE DETECTION INTERVAL: 240 ms
MDC IDC SET ZONE DETECTION INTERVAL: 400 ms
MDC IDC STAT BRADY RV PERCENT PACED: 0 %
Zone Setting Detection Interval: 300 ms

## 2015-04-06 NOTE — Telephone Encounter (Signed)
Evans Lance, MD at 07/23/2014 10:36 AM  carvedilol (COREG) 12.5 MG tabletTake 1 tablet (12.5 mg total) by mouth 2 (two) times daily Patient Instructions     Follow up with Dr. Lovena Le as needed.   Your physician recommends that you continue on your current medications as directed. Please refer to the Current Medication list given to you today.

## 2015-04-21 ENCOUNTER — Encounter: Payer: Self-pay | Admitting: Cardiology

## 2015-05-01 ENCOUNTER — Ambulatory Visit: Payer: Medicare Other | Admitting: Internal Medicine

## 2015-05-05 ENCOUNTER — Encounter: Payer: Self-pay | Admitting: Internal Medicine

## 2015-05-08 ENCOUNTER — Ambulatory Visit (INDEPENDENT_AMBULATORY_CARE_PROVIDER_SITE_OTHER): Payer: Medicare Other | Admitting: Internal Medicine

## 2015-05-08 ENCOUNTER — Encounter: Payer: Self-pay | Admitting: Internal Medicine

## 2015-05-08 ENCOUNTER — Other Ambulatory Visit (INDEPENDENT_AMBULATORY_CARE_PROVIDER_SITE_OTHER): Payer: Medicare Other

## 2015-05-08 VITALS — BP 136/82 | HR 80 | Temp 98.7°F | Ht 67.0 in | Wt 205.0 lb

## 2015-05-08 DIAGNOSIS — Z0000 Encounter for general adult medical examination without abnormal findings: Secondary | ICD-10-CM | POA: Diagnosis not present

## 2015-05-08 DIAGNOSIS — D509 Iron deficiency anemia, unspecified: Secondary | ICD-10-CM

## 2015-05-08 DIAGNOSIS — E785 Hyperlipidemia, unspecified: Secondary | ICD-10-CM | POA: Diagnosis not present

## 2015-05-08 LAB — CBC WITH DIFFERENTIAL/PLATELET
BASOS PCT: 0.6 % (ref 0.0–3.0)
Basophils Absolute: 0 10*3/uL (ref 0.0–0.1)
EOS PCT: 2.5 % (ref 0.0–5.0)
Eosinophils Absolute: 0.2 10*3/uL (ref 0.0–0.7)
HEMATOCRIT: 35.1 % — AB (ref 36.0–46.0)
HEMOGLOBIN: 11.3 g/dL — AB (ref 12.0–15.0)
LYMPHS PCT: 50.2 % — AB (ref 12.0–46.0)
Lymphs Abs: 3.5 10*3/uL (ref 0.7–4.0)
MCHC: 32.3 g/dL (ref 30.0–36.0)
MCV: 81.6 fl (ref 78.0–100.0)
Monocytes Absolute: 0.5 10*3/uL (ref 0.1–1.0)
Monocytes Relative: 7.1 % (ref 3.0–12.0)
Neutro Abs: 2.8 10*3/uL (ref 1.4–7.7)
Neutrophils Relative %: 39.6 % — ABNORMAL LOW (ref 43.0–77.0)
Platelets: 236 10*3/uL (ref 150.0–400.0)
RBC: 4.31 Mil/uL (ref 3.87–5.11)
RDW: 15.3 % (ref 11.5–15.5)
WBC: 7 10*3/uL (ref 4.0–10.5)

## 2015-05-08 LAB — LIPID PANEL
CHOL/HDL RATIO: 4
Cholesterol: 225 mg/dL — ABNORMAL HIGH (ref 0–200)
HDL: 60.3 mg/dL (ref >39.00)
LDL CALC: 134 mg/dL — AB (ref 0–99)
NonHDL: 165.08
TRIGLYCERIDES: 155 mg/dL — AB (ref 0.0–149.0)
VLDL: 31 mg/dL (ref 0.0–40.0)

## 2015-05-08 LAB — BASIC METABOLIC PANEL
BUN: 21 mg/dL (ref 6–23)
CALCIUM: 9.8 mg/dL (ref 8.4–10.5)
CO2: 28 mEq/L (ref 19–32)
CREATININE: 1.49 mg/dL — AB (ref 0.40–1.20)
Chloride: 103 mEq/L (ref 96–112)
GFR: 44.18 mL/min — AB (ref >60.00)
Glucose, Bld: 99 mg/dL (ref 70–99)
POTASSIUM: 5.5 meq/L — AB (ref 3.5–5.1)
Sodium: 141 mEq/L (ref 135–145)

## 2015-05-08 LAB — URINALYSIS, ROUTINE W REFLEX MICROSCOPIC
BILIRUBIN URINE: NEGATIVE
Ketones, ur: NEGATIVE
NITRITE: NEGATIVE
Specific Gravity, Urine: 1.01 (ref 1.000–1.030)
URINE GLUCOSE: NEGATIVE
Urobilinogen, UA: 0.2 (ref 0.0–1.0)
pH: 7.5 (ref 5.0–8.0)

## 2015-05-08 LAB — IBC PANEL
IRON: 82 ug/dL (ref 42–145)
SATURATION RATIOS: 25.5 % (ref 20.0–50.0)
Transferrin: 230 mg/dL (ref 212.0–360.0)

## 2015-05-08 LAB — HEPATIC FUNCTION PANEL
ALBUMIN: 4 g/dL (ref 3.5–5.2)
ALT: 13 U/L (ref 0–35)
AST: 13 U/L (ref 0–37)
Alkaline Phosphatase: 61 U/L (ref 39–117)
BILIRUBIN TOTAL: 0.3 mg/dL (ref 0.2–1.2)
Bilirubin, Direct: 0 mg/dL (ref 0.0–0.3)
Total Protein: 7.7 g/dL (ref 6.0–8.3)

## 2015-05-08 LAB — TSH: TSH: 0.72 u[IU]/mL (ref 0.35–4.50)

## 2015-05-08 LAB — FERRITIN: FERRITIN: 51.4 ng/mL (ref 10.0–291.0)

## 2015-05-08 MED ORDER — ALBUTEROL SULFATE HFA 108 (90 BASE) MCG/ACT IN AERS: 2.0000 | INHALATION_SPRAY | Freq: Four times a day (QID) | RESPIRATORY_TRACT | Status: DC | PRN

## 2015-05-08 NOTE — Progress Notes (Signed)
Subjective:    Patient ID: [name] [name], female    DOB: [birth year], 74 y.o.   MRN: [id number]  HPI Here for wellness and f/u;  Overall doing ok;  Pt denies Chest pain, worsening SOB, DOE, wheezing, orthopnea, PND, worsening LE edema, palpitations, dizziness or syncope.  Pt denies neurological change such as new headache, facial or extremity weakness.  Pt denies polydipsia, polyuria, or low sugar symptoms. Pt states overall good compliance with treatment and medications, good tolerability, and has been trying to follow appropriate diet.  Pt denies worsening depressive symptoms, suicidal ideation or panic. No fever, night sweats, wt loss, loss of appetite, or other constitutional symptoms.  Pt states good ability with ADL's, has low fall risk, home safety reviewed and adequate, no other significant changes in hearing or vision, and only occasionally active with exercise. Wt at home 195, but here is higher. Wt Readings from Last 3 Encounters:  05/08/15 205 lb (92.987 kg)  10/31/14 209 lb 4 oz (94.915 kg)  07/23/14 210 lb 6.4 oz (95.437 kg)  Only using son's proair 1-2 times per wk, wants to try off advair since not really using. Declines immunizations.  Was out of town, and was told at one point her iron was low, to take iron supplement.   Past Medical History  Diagnosis Date  . ALLERGIC RHINITIS 11/06/2007  . 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 ANEMIA-NOS 06/17/2008  . ANXIETY 02/22/2008  . CARDIOMYOPATHY, PRIMARY, DILATED 12/30/2008  . [place] DISEASE, 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  . GERD 05/07/2007  . HYPERLIPIDEMIA 05/23/2007  . HYPERTENSION 05/07/2007  . 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 MENOPAUSAL DISORDER 05/06/2008  . 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 OVERACTIVE BLADDER 08/03/2010  . PYELONEPHRITIS, HX OF 05/07/2007  . SYSTOLIC HEART FAILURE, CHRONIC 12/28/2008  . VITAMIN D DEFICIENCY 06/17/2008   Past Surgical History  Procedure Laterality Date  . Laminectomy    .  Abdominal hysterectomy      reports that she has never smoked. She does not have any smokeless tobacco history on file. She reports that she does not drink alcohol or use illicit drugs. family history includes Arthritis in her other; Coronary artery disease in her other; Heart disease in her mother. Allergies  Allergen Reactions  . Zocor [Simvastatin - High Dose] Nausea Only    Nausea    Current Outpatient Prescriptions on File Prior to Visit  Medication Sig Dispense Refill  . ALPRAZolam (XANAX) 0.25 MG tablet Take 1 tablet (0.25 mg total) by mouth 2 (two) times daily as needed for anxiety. 60 tablet 1  . benazepril (LOTENSIN) 10 MG tablet Take 1 tablet (10 mg total) by mouth daily. 30 tablet 10  . carvedilol (COREG) 12.5 MG tablet Take 1 tablet (12.5 mg total) by mouth 2 (two) times daily. 180 tablet 3  . carvedilol (COREG) 12.5 MG tablet TAKE 1 TABLET (12.5 MG TOTAL) BY MOUTH 2 (TWO) TIMES DAILY. 180 tablet 1  . Cholecalciferol (VITAMIN D3) 1000 UNITS CAPS Take 1 capsule by mouth daily.     . diclofenac (VOLTAREN) 75 MG EC tablet TAKE 1 TABLET BY MOUTH 2 TIMES DAILY 60 tablet 2  . diphenhydrAMINE (BENADRYL) 25 MG tablet Take 25 mg by mouth every 6 (six) hours as needed for allergies.     [name] esomeprazole (NEXIUM) 40 MG capsule Take 40 mg by mouth daily at 12 noon.    [name] estradiol (ESTRACE) 1 MG tablet Take 1 tablet (1 mg total) by mouth daily. [place]  tablet 1  . furosemide (LASIX) 40 MG tablet Take 1 tablet (40 mg total) by mouth 2 (two) times daily. 180 tablet 3  . HYDROcodone-acetaminophen (NORCO/VICODIN) 5-325 MG per tablet TAKE 1 TABLET BY MOUTH EVERY 6 HOURS AS NEEDED FOR PAIN- to fill  Oct 04, 2013 120 tablet 0  . spironolactone (ALDACTONE) 25 MG tablet Take 1 tablet (25 mg total) by mouth daily. 90 tablet 1  . tiZANidine (ZANAFLEX) 4 MG tablet Take 1 tablet (4 mg total) by mouth every 6 (six) hours as needed. 60 tablet 4  . triamcinolone (NASACORT AQ) 55 MCG/ACT AERO nasal inhaler Place 2  sprays into the nose daily. 1 Inhaler 12   No current facility-administered medications on file prior to visit.   Review of Systems Constitutional: Negative for increased diaphoresis, other activity, appetite or siginficant weight change other than noted HENT: Negative for worsening hearing loss, ear pain, facial swelling, mouth sores and neck stiffness.   Eyes: Negative for other worsening pain, redness or visual disturbance.  Respiratory: Negative for shortness of breath and wheezing  Cardiovascular: Negative for chest pain and palpitations.  Gastrointestinal: Negative for diarrhea, blood in stool, abdominal distention or other pain Genitourinary: Negative for hematuria, flank pain or change in urine volume.  Musculoskeletal: Negative for myalgias or other joint complaints.  Skin: Negative for color change and wound or drainage.  Neurological: Negative for syncope and numbness. other than noted Hematological: Negative for adenopathy. or other swelling Psychiatric/Behavioral: Negative for hallucinations, SI, self-injury, decreased concentration or other worsening agitation.      Objective:   Physical Exam BP 136/82 mmHg  Pulse 80  Temp(Src) 98.7 F (37.1 C) (Oral)  Ht 5\' 7"  (1.702 m)  Wt 205 lb (92.987 kg)  BMI 32.10 kg/m2  SpO2 97% VS noted,  Constitutional: Pt is oriented to person, place, and time. Appears well-developed and well-nourished, in no significant distress Head: Normocephalic and atraumatic.  Right Ear: External ear normal.  Left Ear: External ear normal.  Nose: Nose normal.  Mouth/Throat: Oropharynx is clear and moist.  Eyes: Conjunctivae and EOM are normal. Pupils are equal, round, and reactive to light.  Neck: Normal range of motion. Neck supple. No JVD present. No tracheal deviation present or significant neck LA or mass Cardiovascular: Normal rate, regular rhythm, normal heart sounds and intact distal pulses.   Pulmonary/Chest: Effort normal and breath  sounds without rales or wheezing  Abdominal: Soft. Bowel sounds are normal. NT. No HSM  Musculoskeletal: Normal range of motion. Exhibits no edema.  Lymphadenopathy:  Has no cervical adenopathy.  Neurological: Pt is alert and oriented to person, place, and time. Pt has normal reflexes. No cranial nerve deficit. Motor grossly intact Skin: Skin is warm and dry. No rash noted.  Psychiatric:  Has normal mood and affect. Behavior is normal.      Assessment & Plan:

## 2015-05-08 NOTE — Progress Notes (Signed)
Pre visit review using our clinic review tool, if applicable. No additional management support is needed unless otherwise documented below in the visit note. 

## 2015-05-08 NOTE — Assessment & Plan Note (Signed)
Also for iron panel,  to f/u any worsening symptoms or concerns

## 2015-05-08 NOTE — Progress Notes (Signed)
Subjective:   [name] [name] is a 74 y.o. female who presents for Medicare Annual (Subsequent) preventive examination.  Review of Systems:   Cardiac Risk Factors include: advanced age (>73men, >35 women);family history of premature cardiovascular disease;obesity (BMI >30kg/m2);sedentary lifestyle HRA assessment completed during visit; [place]  The Patient was informed that this wellness visit is to identify risk and educate on how to reduce risk for increase disease through lifestyle changes.   ROS deferred to CPE exam with physician  Medical issues  States she feels her health is good.  Cardiomyopathy; 2 d echo 09/2013 very low 10 to 15 %/ implanted device Has had since childhood with congenital heart disease; Was not able to run and play as other children, but overall, has remained in good health. No issue with children; HTN; managed medically on meds; cardiology fup as well for CHF; Hyperlipidemia; HDL 55; LDL 125; chol 199; Trig 92; A1c 5.9 in 2014;   Obesity- states her weight was 195 this am on her scale but 205 on office scale. Her weight is consistent; no edema to ankles, sob etc   BMI: (hx 32) 30.5 on HOME SCALES; which she weighs 195;  Diet; Does not eat breakfast except enough to take her medicine; cooks lunch and dinner; states her "worst habit" is drinking cokes; Exercise; Typical day; goes up and down 15 stairs 2 to 3 times per day Treadmill at home occasionally; get on the floor and play with the baby Go to the [facility]; rides the bike; walk; 2 days;   SAFETY; age in home;  A plan to overcome barriers; She will sit and go up the stairs when knee is hurting Safety reviewed for the home; including removal of clutter; clear paths through the home, eliminating clutter, railing as needed; bathroom safety; (has [place])  Warehouse manager; smoke detectors and firearms safety as well as sun protection (not out in the sun due to allergies)  Driving accidents no and seatbelt  yes Stressors; no  Medication review/ reviewed with Dr .Jenny Reichmann  Fall assessment; no  Mobilization and Functional losses in the last year. No;  Sleep patterns  Urinary or fecal incontinence reviewed Counseling: Colonoscopy; Dr. Jenny Reichmann to schedule (discussed taking liquids)  EKG: 05/09/2014 Hearing: no  Dexa; 05/23/2007 states she had at [facility]:  11/10/2014 no evidence of malignancy Ophthalmology exam; this in June; Dr. Jabier Mutton; Renie Ora; no glaucoma Immunizations Due / declines flu;  Tetanus: had 2008   Flu; Decline  Zostavax: will check part d   Current Care Team reviewed and updated      Objective:     Vitals: BP 136/82 mmHg  Pulse 80  Temp(Src) 98.7 F (37.1 C) (Oral)  Ht 5\' 7"  (1.702 m)  Wt 205 lb (92.987 kg)  BMI 32.10 kg/m2  SpO2 97%  Tobacco History  Smoking status  . Never Smoker   Smokeless tobacco  . Not on file     Counseling given: Not Answered   Past Medical History  Diagnosis Date  . ALLERGIC RHINITIS 11/06/2007  . 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 ANEMIA-NOS 06/17/2008  . ANXIETY 02/22/2008  . CARDIOMYOPATHY, PRIMARY, DILATED 12/30/2008  . [place] DISEASE, 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  . GERD 05/07/2007  . HYPERLIPIDEMIA 05/23/2007  . HYPERTENSION 05/07/2007  . 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 MENOPAUSAL DISORDER 05/06/2008  . 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 OVERACTIVE BLADDER 08/03/2010  . PYELONEPHRITIS, HX OF 05/07/2007  . SYSTOLIC HEART FAILURE, CHRONIC 12/28/2008  . VITAMIN D  DEFICIENCY 06/17/2008   Past Surgical History  Procedure Laterality Date  . Laminectomy    . Abdominal hysterectomy     Family History  Problem Relation Age of Onset  . Arthritis Other   . Coronary artery disease Other   . Heart disease Mother    History  Sexual Activity  . Sexual Activity: Not on file    Outpatient Encounter Prescriptions as of 05/08/2015  Medication Sig  . albuterol (PROVENTIL HFA;VENTOLIN HFA) 108 (90  BASE) MCG/ACT inhaler Inhale 2 puffs into the lungs every 6 (six) hours as needed for wheezing or shortness of breath.  . ALPRAZolam (XANAX) 0.25 MG tablet Take 1 tablet (0.25 mg total) by mouth 2 (two) times daily as needed for anxiety.  . benazepril (LOTENSIN) 10 MG tablet Take 1 tablet (10 mg total) by mouth daily.  . carvedilol (COREG) 12.5 MG tablet Take 1 tablet (12.5 mg total) by mouth 2 (two) times daily.  . carvedilol (COREG) 12.5 MG tablet TAKE 1 TABLET (12.5 MG TOTAL) BY MOUTH 2 (TWO) TIMES DAILY.  [name] Cholecalciferol (VITAMIN D3) 1000 UNITS CAPS Take 1 capsule by mouth daily.   . diclofenac (VOLTAREN) 75 MG EC tablet TAKE 1 TABLET BY MOUTH 2 TIMES DAILY  . diphenhydrAMINE (BENADRYL) 25 MG tablet Take 25 mg by mouth every 6 (six) hours as needed for allergies.   [name] esomeprazole (NEXIUM) 40 MG capsule Take 40 mg by mouth daily at 12 noon.  [name] estradiol (ESTRACE) 1 MG tablet Take 1 tablet (1 mg total) by mouth daily.  . furosemide (LASIX) 40 MG tablet Take 1 tablet (40 mg total) by mouth 2 (two) times daily.  [name] HYDROcodone-acetaminophen (NORCO/VICODIN) 5-325 MG per tablet TAKE 1 TABLET BY MOUTH EVERY 6 HOURS AS NEEDED FOR PAIN- to fill  Oct 04, 2013  . spironolactone (ALDACTONE) 25 MG tablet Take 1 tablet (25 mg total) by mouth daily.  [name] tiZANidine (ZANAFLEX) 4 MG tablet Take 1 tablet (4 mg total) by mouth every 6 (six) hours as needed.  . triamcinolone (NASACORT AQ) 55 MCG/ACT AERO nasal inhaler Place 2 sprays into the nose daily.  . [DISCONTINUED] cetirizine (ZYRTEC) 10 MG tablet Take 1 tablet (10 mg total) by mouth daily.   No facility-administered encounter medications on file as of 05/08/2015.    Activities of Daily Living In your present state of health, do you have any difficulty performing the following activities: 05/08/2015 05/08/2015  Hearing? - N  Vision? - N  Difficulty concentrating or making decisions? - N  Walking or climbing stairs? - N  Dressing or bathing? - N   Doing errands, shopping? - N  Conservation officer, nature and eating ? N N  Using the Toilet? N N  In the past six months, have you accidently leaked urine? N N  Do you have problems with loss of bowel control? N N  Managing your Medications? N N  Managing your Finances? N N  Housekeeping or managing your Housekeeping? N N    Patient Care Team: Biagio Borg, MD as PCP - General    Assessment:    Assessment   Patient presents for yearly preventative medicine examination. Medicare questionnaire screening were completed, i.e. Functional; fall risk; depression, memory loss and hearing. All unremarkable; Could recite 2/3 words x 74 yo; no issues with function Ad8 score 0  All immunizations and health maintenance protocols were reviewed and Ms. Nienhuis declines flu. 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 Is going to prepare for colonoscopy.  Education provided for laboratory screens;  Stated she was out of state and found her iron to be low; Dr. Jenny Reichmann to follow  Medication reconciliation, past medical history, social history, problem list and allergies were reviewed in detail with the patient  Goals were established with regard to weight loss, exercise, and diet in compliance with medications based on the patient individualized risk;  Reviewed "healthy diet" and discussed calories.   End of life planning was discussed and the patient has completed and her 2 dtr are her POA    Exercise Activities and Dietary recommendations Current Exercise Habits:: Home exercise routine;Structured exercise class, Time (Minutes): 45, Frequency (Times/Week): 2, Weekly Exercise (Minutes/Week): 90  Goals    None     Fall Risk Fall Risk  05/08/2015 05/08/2015 05/01/2014 02/07/2013  Falls in the past year? No No No No   Depression Screen PHQ 2/9 Scores 05/08/2015 05/08/2015 05/01/2014  02/07/2013  PHQ - 2 Score 0 0 0 0     Cognitive Testing MMSE - Mini Mental State Exam 05/08/2015  Not completed: (No Data)   AD8 Score 0   Immunization History  Administered Date(s) Administered  . Pneumococcal Conjugate-13 02/07/2013  . Pneumococcal Polysaccharide-23 05/01/2014   Screening Tests Health Maintenance  Topic Date Due  . TETANUS/TDAP  11/21/1961  . COLONOSCOPY  11/21/1992  . ZOSTAVAX  11/22/2002  . DEXA SCAN  11/22/2007  . INFLUENZA VACCINE  05/07/2016 (Originally 02/09/2015)  . MAMMOGRAM  11/09/2016  . PNA vac Low Risk Adult  Completed       Plan  To fup with her in one month to assist with weight loss goals;  Will call Part d plan to check on shingles   Declines Flu today  During the course of the visit the patient was educated and counseled about the following appropriate screening and preventive services:   Vaccines to include Pneumoccal, Influenza, Hepatitis B, Td, Zostavax, HCV  Electrocardiogram/ defer to cardiology  Cardiovascular Disease  Colorectal cancer screening/ tbs   Bone density screening/ declined ; last study 2009 per the patient  Diabetes screening/ n/a  Glaucoma screening /negative this year  Mammography/PAP/ neg  Nutrition counseling reviewed   Patient Instructions (the written plan) was given to the patient.   Wynetta Fines, RN  05/08/2015

## 2015-05-08 NOTE — Patient Instructions (Addendum)
Please continue all other medications as before, and refills have been done if requested.  Please have the pharmacy call with any other refills you may need.  Please continue your efforts at being more active, low cholesterol diet, and weight control.  You are otherwise up to date with prevention measures today.  Please keep your appointments with your specialists as you may have planned  You will be contacted regarding the referral for: colonoscopy  Please go to the LAB in the Basement (turn left off the elevator) for the tests to be done today  You will be contacted by phone if any changes need to be made immediately.  Otherwise, you will receive a letter about your results with an explanation, but please check with MyChart first.  Please remember to sign up for MyChart if you have not done so, as this will be important to you in the future with finding out test results, communicating by private email, and scheduling acute appointments online when needed.  Please return in 1 year for your yearly visit, or sooner if needed, with Lab testing done 3-5 days before   [name] [name] , Thank you for taking time to come for your Medicare Wellness Visit. I appreciate your ongoing commitment to your health goals. Please review the following plan we discussed and let me know if I can assist you in the future.   Given information on a healthy diet;   Will check with part D on insurance plan   Look on labels HFCS (high fructose corn syrup)   Will outreach in a couple of week   These are the goals we discussed: Goals    None      This is a list of the screening recommended for you and due dates:  Health Maintenance  Topic Date Due  . Tetanus Vaccine  11/21/1961  . Colon Cancer Screening  11/21/1992  . Shingles Vaccine  11/22/2002  . DEXA scan (bone density measurement)  11/22/2007  . Flu Shot  05/07/2016*  . Mammogram  11/09/2016  . Pneumonia vaccines  Completed  *Topic was  postponed. The date shown is not the original due date.

## 2015-05-09 ENCOUNTER — Other Ambulatory Visit: Payer: Self-pay | Admitting: Internal Medicine

## 2015-05-09 MED ORDER — ROSUVASTATIN CALCIUM 10 MG PO TABS: 10.0000 mg | ORAL_TABLET | Freq: Every day | ORAL | Status: DC

## 2015-05-11 ENCOUNTER — Encounter: Payer: Self-pay | Admitting: Gastroenterology

## 2015-05-12 ENCOUNTER — Encounter: Payer: Self-pay | Admitting: *Deleted

## 2015-05-15 ENCOUNTER — Telehealth: Payer: Self-pay | Admitting: *Deleted

## 2015-05-15 MED ORDER — PROMETHAZINE HCL 25 MG PO TABS: 25.0000 mg | ORAL_TABLET | Freq: Four times a day (QID) | ORAL | Status: DC | PRN

## 2015-05-15 NOTE — Telephone Encounter (Signed)
Done erx 

## 2015-05-15 NOTE — Telephone Encounter (Signed)
Notified pt md sent phenergan to [facility].../lmb

## 2015-05-15 NOTE — Telephone Encounter (Signed)
Receive call pt states saw md last Friday & he was suppose to send rx for phenergan. Pharmacy never received. Pls advise...[name] [name]

## 2015-06-03 ENCOUNTER — Other Ambulatory Visit: Payer: Self-pay | Admitting: Internal Medicine

## 2015-07-07 ENCOUNTER — Encounter: Payer: Self-pay | Admitting: *Deleted

## 2015-08-03 ENCOUNTER — Encounter: Payer: Self-pay | Admitting: Gastroenterology

## 2015-08-05 ENCOUNTER — Ambulatory Visit: Payer: Self-pay | Admitting: Physician Assistant

## 2015-08-11 ENCOUNTER — Other Ambulatory Visit: Payer: Self-pay | Admitting: Internal Medicine

## 2015-08-19 NOTE — Progress Notes (Signed)
HPI: FU nonischemic cardiomyopathy. Most recent nuclear study in November 2008 showed an ejection fraction of 34%. There was mild apical thinning and a mild degree of ischemia cannot be excluded. Low risk. We have treated her medically. 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 Last echocardiogram in March of 2015 showed an ejection fraction of 15-20% and mild to moderate mitral regurgitation. 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 Patient was referred to Dr. Lovena Le by Dr. Caryl Comes for consideration of ablation but Dr. Lovena Le felt medical therapy indicated. Since I last saw her, She notes some dyspnea after walking one block. Minimal orthopnea but no PND, pedal edema, syncope. Occasional brief sharp pain in chest for 1-2 seconds but no other symptoms.  Current Outpatient Prescriptions  Medication Sig Dispense Refill  . albuterol (PROVENTIL HFA;VENTOLIN HFA) 108 (90 BASE) MCG/ACT inhaler Inhale 2 puffs into the lungs every 6 (six) hours as needed for wheezing or shortness of breath. 3 Inhaler 3  . ALPRAZolam (XANAX) 0.25 MG tablet Take 1 tablet (0.25 mg total) by mouth 2 (two) times daily as needed for anxiety. 60 tablet 1  . benazepril (LOTENSIN) 10 MG tablet TAKE 1 TABLET (10 MG TOTAL) BY MOUTH DAILY. 90 tablet 2  . carvedilol (COREG) 12.5 MG tablet Take 1 tablet (12.5 mg total) by mouth 2 (two) times daily. 180 tablet 3  . carvedilol (COREG) 12.5 MG tablet TAKE 1 TABLET (12.5 MG TOTAL) BY MOUTH 2 (TWO) TIMES DAILY. 180 tablet 1  . Cholecalciferol (VITAMIN D3) 1000 UNITS CAPS Take 1 capsule by mouth daily.     . diclofenac (VOLTAREN) 75 MG EC tablet TAKE 1 TABLET BY MOUTH 2 TIMES DAILY 60 tablet 2  . diphenhydrAMINE (BENADRYL) 25 MG tablet Take 25 mg by mouth every 6 (six) hours as needed for allergies.     [name] esomeprazole (NEXIUM) 40 MG capsule Take 40 mg by mouth daily at 12 noon.    [name] estradiol (ESTRACE) 1 MG tablet Take 1 tablet (1 mg total) by mouth  daily. 90 tablet 1  . furosemide (LASIX) 40 MG tablet Take 1 tablet (40 mg total) by mouth 2 (two) times daily. 180 tablet 3  . HYDROcodone-acetaminophen (NORCO/VICODIN) 5-325 MG per tablet TAKE 1 TABLET BY MOUTH EVERY 6 HOURS AS NEEDED FOR PAIN- to fill  Oct 04, 2013 120 tablet 0  . promethazine (PHENERGAN) 25 MG tablet Take 1 tablet (25 mg total) by mouth every 6 (six) hours as needed for nausea or vomiting. 40 tablet 1  . rosuvastatin (CRESTOR) 10 MG tablet Take 1 tablet (10 mg total) by mouth daily. 90 tablet 3  . spironolactone (ALDACTONE) 25 MG tablet Take 1 tablet (25 mg total) by mouth daily. 90 tablet 1  . tiZANidine (ZANAFLEX) 4 MG tablet Take 1 tablet (4 mg total) by mouth every 6 (six) hours as needed. 60 tablet 4  . triamcinolone (NASACORT AQ) 55 MCG/ACT AERO nasal inhaler Place 2 sprays into the nose daily. 1 Inhaler 12   No current facility-administered medications for this visit.     Past Medical History  Diagnosis Date  . ALLERGIC RHINITIS 11/06/2007  . 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 ANEMIA-NOS 06/17/2008  . ANXIETY 02/22/2008  . CARDIOMYOPATHY, PRIMARY, DILATED 12/30/2008  . [place] DISEASE, 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  . GERD 05/07/2007  . HYPERLIPIDEMIA 05/23/2007  . HYPERTENSION 05/07/2007  . 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 MENOPAUSAL DISORDER 05/06/2008  . 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 OVERACTIVE BLADDER 08/03/2010  . PYELONEPHRITIS, HX OF 05/07/2007  . SYSTOLIC HEART FAILURE, CHRONIC 12/28/2008  . VITAMIN D DEFICIENCY 06/17/2008    Past Surgical History  Procedure Laterality Date  . Laminectomy    . Abdominal hysterectomy      Social History   Social History  . Marital Status: Widowed    Spouse Name: N/A  . Number of Children: 3  . Years of Education: N/A   Occupational History  . 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 Smoking status: Never Smoker   . Smokeless tobacco: Not on file  . Alcohol Use: No    . Drug Use: No  . Sexual Activity: Not on file   Other Topics Concern  . Not on file   Social History Narrative    Family History  Problem Relation Age of Onset  . Arthritis Other   . Coronary artery disease Other   . Heart disease Mother     ROS: no fevers or chills, productive cough, hemoptysis, dysphasia, odynophagia, melena, hematochezia, dysuria, hematuria, rash, seizure activity, orthopnea, PND, pedal edema, claudication. Remaining systems are negative.  Physical Exam: Well-developed well-nourished in no acute distress.  Skin is warm and dry.  HEENT is normal.  Neck is supple.  Chest is clear to auscultation with normal expansion.  Cardiovascular exam is regular rate and rhythm.  Abdominal exam nontender or distended. No masses palpated. Extremities show no edema. neuro grossly intact  ECG Sinus rhythm at a rate of 72. Nonspecific inferior lateral T-wave changes.

## 2015-08-24 ENCOUNTER — Ambulatory Visit (INDEPENDENT_AMBULATORY_CARE_PROVIDER_SITE_OTHER): Payer: Medicare Other | Admitting: Cardiology

## 2015-08-24 ENCOUNTER — Encounter: Payer: Self-pay | Admitting: Cardiology

## 2015-08-24 DIAGNOSIS — Z95810 Presence of automatic (implantable) cardiac defibrillator: Secondary | ICD-10-CM | POA: Diagnosis not present

## 2015-08-24 DIAGNOSIS — I428 Other cardiomyopathies: Secondary | ICD-10-CM

## 2015-08-24 DIAGNOSIS — I429 Cardiomyopathy, unspecified: Secondary | ICD-10-CM | POA: Diagnosis not present

## 2015-08-24 DIAGNOSIS — I10 Essential (primary) hypertension: Secondary | ICD-10-CM | POA: Diagnosis not present

## 2015-08-24 MED ORDER — SACUBITRIL-VALSARTAN 24-26 MG PO TABS: 1.0000 | ORAL_TABLET | Freq: Two times a day (BID) | ORAL | Status: DC

## 2015-08-24 NOTE — Assessment & Plan Note (Signed)
Continue present dose of diuretics. Check potassium, renal function and BNP.

## 2015-08-24 NOTE — Assessment & Plan Note (Signed)
Continue statin. 

## 2015-08-24 NOTE — Assessment & Plan Note (Signed)
Management per electrophysiology. 

## 2015-08-24 NOTE — Patient Instructions (Signed)
Medication Instructions:   STOP BENAZEPRIL  AFTER BEING OFF BENAZEPRIL X 2 DAYS START ENTRESTO 24/26 MG ONE TABLET TWICE DAILY  Labwork:  Your physician recommends that you return for lab work in: [place]:  Your physician recommends that you schedule a follow-up appointment in: [place]   If you need a refill on your cardiac medications before your next appointment, please call your pharmacy.

## 2015-08-24 NOTE — Assessment & Plan Note (Signed)
Continue beta blocker.Further management per electrophysiology. Dr. Lovena Le recommended watchful waiting at this point.

## 2015-08-24 NOTE — Assessment & Plan Note (Signed)
Blood pressure controlled. Continue present medications. 

## 2015-08-24 NOTE — Assessment & Plan Note (Signed)
Continue beta blocker. Discontinue Lotensin. 48 hours later we will start entresto 24/26 BID. Continue spironolactone. Check potassium and renal function in 1 week.

## 2015-08-25 ENCOUNTER — Telehealth: Payer: Self-pay | Admitting: *Deleted

## 2015-08-25 NOTE — Telephone Encounter (Signed)
Prior authorization form for entresto faxed.

## 2015-08-26 ENCOUNTER — Ambulatory Visit: Payer: Self-pay | Admitting: Physician Assistant

## 2015-09-01 ENCOUNTER — Other Ambulatory Visit: Payer: Self-pay | Admitting: *Deleted

## 2015-09-01 DIAGNOSIS — I5022 Chronic systolic (congestive) heart failure: Secondary | ICD-10-CM

## 2015-09-01 MED ORDER — SPIRONOLACTONE 25 MG PO TABS: 25.0000 mg | ORAL_TABLET | Freq: Every day | ORAL | Status: DC

## 2015-09-02 ENCOUNTER — Encounter: Payer: Self-pay | Admitting: *Deleted

## 2015-09-03 ENCOUNTER — Encounter: Payer: Self-pay | Admitting: *Deleted

## 2015-09-16 ENCOUNTER — Other Ambulatory Visit: Payer: Self-pay | Admitting: Internal Medicine

## 2015-09-22 NOTE — Telephone Encounter (Signed)
entresto approved

## 2015-09-25 ENCOUNTER — Encounter: Payer: Medicare Other | Admitting: Internal Medicine

## 2015-10-05 ENCOUNTER — Telehealth: Payer: Self-pay | Admitting: Internal Medicine

## 2015-10-05 NOTE — Telephone Encounter (Signed)
[facility] [facility]  Patient Name: [name] [name]  DOB: May 05, 1941    Initial Comment Caller States she is vomiting, temp of 100   Nurse Assessment  Nurse: Verlin Fester, RN, [place] Kidney Date/Time (Eastern Time): 10/05/2015 9:18:02 AM  Confirm and document reason for call. If symptomatic, describe symptoms. You must click the next button to save text entered. ---Patient states she is having pain on the left sinus area, vomited X 5-6 and she has fever 100 (oral), she is sneezing so hard that she vomits  Has the patient traveled out of the country within the last 30 days? ---No  Does the patient have any new or worsening symptoms? ---Yes  Will a triage be completed? ---Yes  Related visit to physician within the last 2 weeks? ---No  Does the PT have any chronic conditions? (i.e. diabetes, asthma, etc.) ---Yes  List chronic conditions. ---"heart, defibrillator"  Is this a behavioral health or substance abuse call? ---No     Guidelines    Guideline Title Affirmed Question Affirmed Notes  Vomiting [1] MODERATE vomiting (e.g., 3 - 5 times/day) AND [2] age > 17    Final Disposition User   Go to ED Now (or PCP triage) Verlin Fester, RN, [place] REFUSED   Disagree/Comply: Disagree  Disagree/Comply Reason: Disagree with instructions

## 2015-10-05 NOTE — Telephone Encounter (Signed)
Called to advise patient to go to urgent care since we do not have any openings and she is refusing to go to ER. Unable to reach patient, left message to give [place] a call back.

## 2015-10-05 NOTE — Telephone Encounter (Signed)
After hours nurse called in to inform that pt called in stating she was vomiting and had sinus pain. Pt refused to go to ED. Please call pt and advise to go to urgent care.

## 2015-10-07 ENCOUNTER — Encounter: Payer: Self-pay | Admitting: Internal Medicine

## 2015-10-07 ENCOUNTER — Ambulatory Visit (INDEPENDENT_AMBULATORY_CARE_PROVIDER_SITE_OTHER): Payer: Medicare Other | Admitting: Internal Medicine

## 2015-10-07 VITALS — BP 130/82 | HR 86 | Temp 98.8°F | Resp 20 | Wt 208.0 lb

## 2015-10-07 DIAGNOSIS — Z0000 Encounter for general adult medical examination without abnormal findings: Secondary | ICD-10-CM

## 2015-10-07 DIAGNOSIS — J019 Acute sinusitis, unspecified: Secondary | ICD-10-CM | POA: Diagnosis not present

## 2015-10-07 DIAGNOSIS — E785 Hyperlipidemia, unspecified: Secondary | ICD-10-CM

## 2015-10-07 DIAGNOSIS — Z0189 Encounter for other specified special examinations: Secondary | ICD-10-CM

## 2015-10-07 DIAGNOSIS — J309 Allergic rhinitis, unspecified: Secondary | ICD-10-CM | POA: Diagnosis not present

## 2015-10-07 DIAGNOSIS — G43909 Migraine, unspecified, not intractable, without status migrainosus: Secondary | ICD-10-CM | POA: Insufficient documentation

## 2015-10-07 DIAGNOSIS — G43809 Other migraine, not intractable, without status migrainosus: Secondary | ICD-10-CM | POA: Diagnosis not present

## 2015-10-07 DIAGNOSIS — I10 Essential (primary) hypertension: Secondary | ICD-10-CM

## 2015-10-07 MED ORDER — AZITHROMYCIN 250 MG PO TABS: ORAL_TABLET | ORAL | Status: DC

## 2015-10-07 MED ORDER — BUTALBITAL-APAP-CAFFEINE 50-325-40 MG PO TABS: 1.0000 | ORAL_TABLET | Freq: Two times a day (BID) | ORAL | Status: DC | PRN

## 2015-10-07 NOTE — Assessment & Plan Note (Signed)
stable overall by history and exam, recent data reviewed with pt, and pt to continue medical treatment as before,  to f/u any worsening symptoms or concerns Lab Results  Component Value Date   LDLCALC 134* 05/08/2015   Now on crestor 10, for f/u lab

## 2015-10-07 NOTE — Assessment & Plan Note (Signed)
Mild to mod, for antibx course,  to f/u any worsening symptoms or concerns 

## 2015-10-07 NOTE — Progress Notes (Signed)
Pre visit review using our clinic review tool, if applicable. No additional management support is needed unless otherwise documented below in the visit note. 

## 2015-10-07 NOTE — Assessment & Plan Note (Signed)
Mild to mod, for zyrtec 10 prn,  to f/u any worsening symptoms or concerns

## 2015-10-07 NOTE — Assessment & Plan Note (Signed)
stable overall by history and exam, recent data reviewed with pt, and pt to continue medical treatment as before,  to f/u any worsening symptoms or concerns BP Readings from Last 3 Encounters:  10/07/15 130/82  08/24/15 122/78  05/08/15 136/82

## 2015-10-07 NOTE — Progress Notes (Signed)
Subjective:    Patient ID: [name] [name], female    DOB: 08-26-1940, 75 y.o.   MRN: [id number]  HPI   Here with 2-3 days acute onset fever to reported 104.9, facial pain, pressure, headache, general weakness and malaise, and greenish d/c, with mild ST and cough, but pt denies chest pain, wheezing, increased sob or doe, orthopnea, PND, increased LE swelling, palpitations, dizziness or syncope. Does have several wks ongoing nasal allergy symptoms with clearish congestion, itch and sneezing, without fever, pain, ST, cough, swelling or wheezing.  Also with worsening migraine freq and severity in recent wks, has hx CV dz and non imitrex candidate. Asks for BMP to be done here and forwarded to card/dr Crenshaw as she did not go to the [facility] lab Past Medical History  Diagnosis Date  . ALLERGIC RHINITIS 11/06/2007  . 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 ANEMIA-NOS 06/17/2008  . ANXIETY 02/22/2008  . CARDIOMYOPATHY, PRIMARY, DILATED 12/30/2008  . [place] DISEASE, 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  . GERD 05/07/2007  . HYPERLIPIDEMIA 05/23/2007  . HYPERTENSION 05/07/2007  . 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 MENOPAUSAL DISORDER 05/06/2008  . 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 OVERACTIVE BLADDER 08/03/2010  . PYELONEPHRITIS, HX OF 05/07/2007  . SYSTOLIC HEART FAILURE, CHRONIC 12/28/2008  . VITAMIN D DEFICIENCY 06/17/2008   Past Surgical History  Procedure Laterality Date  . Laminectomy    . Abdominal hysterectomy      reports that she has never smoked. She does not have any smokeless tobacco history on file. She reports that she does not drink alcohol or use illicit drugs. family history includes Arthritis in her other; Coronary artery disease in her other; Heart disease in her mother. Allergies  Allergen Reactions  . Zocor [Simvastatin - High Dose] Nausea Only    Nausea    Current Outpatient Prescriptions on File Prior to Visit  Medication Sig Dispense Refill  . albuterol  (PROVENTIL HFA;VENTOLIN HFA) 108 (90 BASE) MCG/ACT inhaler Inhale 2 puffs into the lungs every 6 (six) hours as needed for wheezing or shortness of breath. 3 Inhaler 3  . ALPRAZolam (XANAX) 0.25 MG tablet Take 1 tablet (0.25 mg total) by mouth 2 (two) times daily as needed for anxiety. 60 tablet 1  . carvedilol (COREG) 12.5 MG tablet Take 1 tablet (12.5 mg total) by mouth 2 (two) times daily. 180 tablet 3  . Cholecalciferol (VITAMIN D3) 1000 UNITS CAPS Take 1 capsule by mouth daily.     . diclofenac (VOLTAREN) 75 MG EC tablet TAKE 1 TABLET BY MOUTH 2 TIMES DAILY 60 tablet 2  . diphenhydrAMINE (BENADRYL) 25 MG tablet Take 25 mg by mouth every 6 (six) hours as needed for allergies.     [name] esomeprazole (NEXIUM) 40 MG capsule Take 40 mg by mouth daily at 12 noon.    [name] estradiol (ESTRACE) 1 MG tablet Take 1 tablet (1 mg total) by mouth daily. 90 tablet 1  . furosemide (LASIX) 40 MG tablet Take 1 tablet (40 mg total) by mouth 2 (two) times daily. 180 tablet 3  . HYDROcodone-acetaminophen (NORCO/VICODIN) 5-325 MG per tablet TAKE 1 TABLET BY MOUTH EVERY 6 HOURS AS NEEDED FOR PAIN- to fill  Oct 04, 2013 120 tablet 0  . promethazine (PHENERGAN) 25 MG tablet Take 1 tablet (25 mg total) by mouth every 6 (six) hours as needed for nausea or vomiting. 40 tablet 1  . rosuvastatin (CRESTOR) 10 MG tablet Take 1 tablet (10 mg total) by mouth daily. [place]  tablet 3  . sacubitril-valsartan (ENTRESTO) 24-26 MG Take 1 tablet by mouth 2 (two) times daily. 60 tablet 6  . spironolactone (ALDACTONE) 25 MG tablet Take 1 tablet (25 mg total) by mouth daily. 90 tablet 1  . tiZANidine (ZANAFLEX) 4 MG tablet TAKE ONE TABLET BY MOUTH EVERY SIX HOURS AS NEEDED 60 tablet 4  . triamcinolone (NASACORT AQ) 55 MCG/ACT AERO nasal inhaler Place 2 sprays into the nose daily. 1 Inhaler 12   No current facility-administered medications on file prior to visit.   Review of Systems  Constitutional: Negative for unusual diaphoresis or night  sweats HENT: Negative for ear swelling or discharge Eyes: Negative for worsening visual haziness  Respiratory: Negative for choking and stridor.   Gastrointestinal: Negative for distension or worsening eructation Genitourinary: Negative for retention or change in urine volume.  Musculoskeletal: Negative for other MSK pain or swelling Skin: Negative for color change and worsening wound Neurological: Negative for tremors and numbness other than noted  Psychiatric/Behavioral: Negative for decreased concentration or agitation other than above       Objective:   Physical Exam BP 130/82 mmHg  Pulse 86  Temp(Src) 98.8 F (37.1 C) (Oral)  Resp 20  Wt 208 lb (94.348 kg)  SpO2 98% VS noted, mild ill Constitutional: Pt appears in no apparent distress HENT: Head: NCAT.  Right Ear: External ear normal.  Left Ear: External ear normal.  Bilat tm's with mild erythema.  Max sinus areas mild tender.  Pharynx with mild erythema, no exudate Eyes: . Pupils are equal, round, and reactive to light. Conjunctivae and EOM are normal Neck: Normal range of motion. Neck supple.  Cardiovascular: Normal rate and regular rhythm.   Pulmonary/Chest: Effort normal and breath sounds without rales or wheezing.  Neurological: Pt is alert. Not confused , motor grossly intact Skin: Skin is warm. No rash, no LE edema Psychiatric: Pt behavior is normal. No agitation.     Assessment & Plan:

## 2015-10-07 NOTE — Assessment & Plan Note (Signed)
Mild to mod, for fioricet prn,  to f/u any worsening symptoms or concerns

## 2015-10-07 NOTE — Patient Instructions (Signed)
Please take all new medication as prescribed  - the antibiotic, and also the fioricet only if you are having a migraine  Please continue all other medications as before, and refills have been done if requested.  Please have the pharmacy call with any other refills you may need..  Please keep your appointments with your specialists as you may have planned  Please go to the LAB in the Basement (turn left off the elevator) for the tests to be done tomorrow or as you can  You will be contacted by phone if any changes need to be made immediately.  Otherwise, you will receive a letter about your results with an explanation, but please check with MyChart first.  Please remember to sign up for MyChart if you have not done so, as this will be important to you in the future with finding out test results, communicating by private email, and scheduling acute appointments online when needed.  We will try to forward the lab to Dr Stanford Breed as well  Please return in 6 months, or sooner if needed, with Lab testing done 3-5 days before

## 2015-10-08 ENCOUNTER — Ambulatory Visit: Payer: Self-pay | Admitting: Internal Medicine

## 2015-10-08 ENCOUNTER — Other Ambulatory Visit (INDEPENDENT_AMBULATORY_CARE_PROVIDER_SITE_OTHER): Payer: Medicare Other

## 2015-10-08 DIAGNOSIS — I10 Essential (primary) hypertension: Secondary | ICD-10-CM | POA: Diagnosis not present

## 2015-10-08 LAB — CBC WITH DIFFERENTIAL/PLATELET
BASOS PCT: 0.6 % (ref 0.0–3.0)
Basophils Absolute: 0 10*3/uL (ref 0.0–0.1)
EOS ABS: 0.1 10*3/uL (ref 0.0–0.7)
EOS PCT: 2.2 % (ref 0.0–5.0)
HCT: 32.2 % — ABNORMAL LOW (ref 36.0–46.0)
HEMOGLOBIN: 10.5 g/dL — AB (ref 12.0–15.0)
LYMPHS ABS: 3.8 10*3/uL (ref 0.7–4.0)
Lymphocytes Relative: 65.4 % — ABNORMAL HIGH (ref 12.0–46.0)
MCHC: 32.5 g/dL (ref 30.0–36.0)
MCV: 80.5 fl (ref 78.0–100.0)
MONO ABS: 0.5 10*3/uL (ref 0.1–1.0)
Monocytes Relative: 7.7 % (ref 3.0–12.0)
NEUTROS PCT: 24.1 % — AB (ref 43.0–77.0)
Neutro Abs: 1.4 10*3/uL (ref 1.4–7.7)
Platelets: 224 10*3/uL (ref 150.0–400.0)
RBC: 4.01 Mil/uL (ref 3.87–5.11)
RDW: 15.1 % (ref 11.5–15.5)
WBC: 5.9 10*3/uL (ref 4.0–10.5)

## 2015-10-08 LAB — HEPATIC FUNCTION PANEL
ALT: 21 U/L (ref 0–35)
AST: 18 U/L (ref 0–37)
Albumin: 3.8 g/dL (ref 3.5–5.2)
Alkaline Phosphatase: 63 U/L (ref 39–117)
BILIRUBIN DIRECT: 0.1 mg/dL (ref 0.0–0.3)
Total Bilirubin: 0.3 mg/dL (ref 0.2–1.2)
Total Protein: 7.3 g/dL (ref 6.0–8.3)

## 2015-10-08 LAB — BASIC METABOLIC PANEL
BUN: 26 mg/dL — ABNORMAL HIGH (ref 6–23)
CALCIUM: 9.1 mg/dL (ref 8.4–10.5)
CO2: 28 meq/L (ref 19–32)
CREATININE: 1.65 mg/dL — AB (ref 0.40–1.20)
Chloride: 102 mEq/L (ref 96–112)
GFR: 39.23 mL/min — AB (ref >60.00)
Glucose, Bld: 87 mg/dL (ref 70–99)
Potassium: 4.5 mEq/L (ref 3.5–5.1)
Sodium: 138 mEq/L (ref 135–145)

## 2015-10-09 ENCOUNTER — Other Ambulatory Visit: Payer: Self-pay | Admitting: Internal Medicine

## 2015-10-14 ENCOUNTER — Encounter: Payer: Self-pay | Admitting: *Deleted

## 2015-10-14 ENCOUNTER — Telehealth: Payer: Self-pay | Admitting: *Deleted

## 2015-10-14 DIAGNOSIS — I5022 Chronic systolic (congestive) heart failure: Secondary | ICD-10-CM

## 2015-10-14 NOTE — Telephone Encounter (Signed)
Fu primary care for anemia; repeat bmet 2 weeks    [name] [name]   My chart message sent to pt regarding lab results. Lab orders mailed to the pt

## 2015-11-06 ENCOUNTER — Other Ambulatory Visit: Payer: Self-pay

## 2015-11-06 MED ORDER — DICLOFENAC SODIUM 75 MG PO TBEC: 75.0000 mg | DELAYED_RELEASE_TABLET | Freq: Two times a day (BID) | ORAL | Status: DC

## 2015-11-12 ENCOUNTER — Other Ambulatory Visit: Payer: Self-pay | Admitting: Internal Medicine

## 2015-11-12 NOTE — Telephone Encounter (Signed)
Medication faxed to pharmacy 

## 2015-11-12 NOTE — Telephone Encounter (Signed)
Done hardcopy to Corinne  

## 2015-11-12 NOTE — Telephone Encounter (Signed)
Please advise 

## 2015-11-16 NOTE — Progress Notes (Signed)
HPI: FU nonischemic cardiomyopathy. Cardiac catheterization performed in August of 2004 showed normal coronary arteries and an ejection fraction of 28%.Most recent nuclear study in November 2008 showed an ejection fraction of 34%. There was mild apical thinning and a mild degree of ischemia cannot be excluded. Low risk. We have treated her medically. 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 Last echocardiogram in March of 2015 showed an ejection fraction of 15-20% and mild to moderate mitral regurgitation. 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 Patient was referred to Dr. Lovena Le by Dr. Caryl Comes for consideration of ablation but Dr. Lovena Le felt medical therapy indicated. Entresto added add last ov. Since I last saw her,   Current Outpatient Prescriptions  Medication Sig Dispense Refill  . albuterol (PROVENTIL HFA;VENTOLIN HFA) 108 (90 BASE) MCG/ACT inhaler Inhale 2 puffs into the lungs every 6 (six) hours as needed for wheezing or shortness of breath. 3 Inhaler 3  . ALPRAZolam (XANAX) 0.25 MG tablet Take 1 tablet (0.25 mg total) by mouth 2 (two) times daily as needed for anxiety. 60 tablet 1  . azithromycin (ZITHROMAX Z-PAK) 250 MG tablet Use as directed 6 tablet 1  . butalbital-acetaminophen-caffeine (FIORICET, ESGIC) 50-325-40 MG tablet TAKE 1 TABLET BY MOUTH TWICE A DAY AS NEEDED FOR HEADACHE 30 tablet 0  . carvedilol (COREG) 12.5 MG tablet Take 1 tablet (12.5 mg total) by mouth 2 (two) times daily. 180 tablet 3  . carvedilol (COREG) 12.5 MG tablet TAKE 1 TABLET BY MOUTH TWICE A DAY 180 tablet 1  . Cholecalciferol (VITAMIN D3) 1000 UNITS CAPS Take 1 capsule by mouth daily.     . diclofenac (VOLTAREN) 75 MG EC tablet Take 1 tablet (75 mg total) by mouth 2 (two) times daily. 60 tablet 2  . diphenhydrAMINE (BENADRYL) 25 MG tablet Take 25 mg by mouth every 6 (six) hours as needed for allergies.     [name] esomeprazole (NEXIUM) 40 MG capsule Take 40 mg by mouth  daily at 12 noon.    [name] estradiol (ESTRACE) 1 MG tablet Take 1 tablet (1 mg total) by mouth daily. 90 tablet 1  . furosemide (LASIX) 40 MG tablet Take 1 tablet (40 mg total) by mouth 2 (two) times daily. 180 tablet 3  . HYDROcodone-acetaminophen (NORCO/VICODIN) 5-325 MG per tablet TAKE 1 TABLET BY MOUTH EVERY 6 HOURS AS NEEDED FOR PAIN- to fill  Oct 04, 2013 120 tablet 0  . promethazine (PHENERGAN) 25 MG tablet Take 1 tablet (25 mg total) by mouth every 6 (six) hours as needed for nausea or vomiting. 40 tablet 1  . rosuvastatin (CRESTOR) 10 MG tablet Take 1 tablet (10 mg total) by mouth daily. 90 tablet 3  . sacubitril-valsartan (ENTRESTO) 24-26 MG Take 1 tablet by mouth 2 (two) times daily. 60 tablet 6  . spironolactone (ALDACTONE) 25 MG tablet Take 1 tablet (25 mg total) by mouth daily. 90 tablet 1  . tiZANidine (ZANAFLEX) 4 MG tablet TAKE ONE TABLET BY MOUTH EVERY SIX HOURS AS NEEDED 60 tablet 4  . triamcinolone (NASACORT AQ) 55 MCG/ACT AERO nasal inhaler Place 2 sprays into the nose daily. 1 Inhaler 12   No current facility-administered medications for this visit.     Past Medical History  Diagnosis Date  . ALLERGIC RHINITIS 11/06/2007  . 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 ANEMIA-NOS 06/17/2008  . ANXIETY 02/22/2008  . CARDIOMYOPATHY, PRIMARY, DILATED 12/30/2008  . [place] DISEASE, 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  . GERD  05/07/2007  . HYPERLIPIDEMIA 05/23/2007  . HYPERTENSION 05/07/2007  . 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 MENOPAUSAL DISORDER 05/06/2008  . 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 OVERACTIVE BLADDER 08/03/2010  . PYELONEPHRITIS, HX OF 05/07/2007  . SYSTOLIC HEART FAILURE, CHRONIC 12/28/2008  . VITAMIN D DEFICIENCY 06/17/2008    Past Surgical History  Procedure Laterality Date  . Laminectomy    . Abdominal hysterectomy      Social History   Social History  . Marital Status: Widowed    Spouse Name: N/A  . Number of Children: 3  . Years of Education:  N/A   Occupational History  . 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 Smoking status: Never Smoker   . Smokeless tobacco: Not on file  . Alcohol Use: No  . Drug Use: No  . Sexual Activity: Not on file   Other Topics Concern  . Not on file   Social History Narrative    Family History  Problem Relation Age of Onset  . Arthritis Other   . Coronary artery disease Other   . Heart disease Mother     ROS: no fevers or chills, productive cough, hemoptysis, dysphasia, odynophagia, melena, hematochezia, dysuria, hematuria, rash, seizure activity, orthopnea, PND, pedal edema, claudication. Remaining systems are negative.  Physical Exam: Well-developed well-nourished in no acute distress.  Skin is warm and dry.  HEENT is normal.  Neck is supple.  Chest is clear to auscultation with normal expansion.  Cardiovascular exam is regular rate and rhythm.  Abdominal exam nontender or distended. No masses palpated. Extremities show no edema. neuro grossly intact  ECG     This encounter was created in error - please disregard.

## 2015-11-23 ENCOUNTER — Encounter: Payer: Self-pay | Admitting: Cardiology

## 2015-12-11 ENCOUNTER — Ambulatory Visit (INDEPENDENT_AMBULATORY_CARE_PROVIDER_SITE_OTHER): Payer: Medicare Other | Admitting: Internal Medicine

## 2015-12-11 ENCOUNTER — Encounter: Payer: Self-pay | Admitting: Internal Medicine

## 2015-12-11 VITALS — BP 114/64 | HR 84 | Ht 67.0 in | Wt 216.6 lb

## 2015-12-11 DIAGNOSIS — I429 Cardiomyopathy, unspecified: Secondary | ICD-10-CM | POA: Diagnosis not present

## 2015-12-11 DIAGNOSIS — I5022 Chronic systolic (congestive) heart failure: Secondary | ICD-10-CM | POA: Diagnosis not present

## 2015-12-11 DIAGNOSIS — I493 Ventricular premature depolarization: Secondary | ICD-10-CM | POA: Diagnosis not present

## 2015-12-11 DIAGNOSIS — Z95810 Presence of automatic (implantable) cardiac defibrillator: Secondary | ICD-10-CM | POA: Diagnosis not present

## 2015-12-11 DIAGNOSIS — I428 Other cardiomyopathies: Secondary | ICD-10-CM

## 2015-12-11 LAB — CUP PACEART INCLINIC DEVICE CHECK
Battery Voltage: 2.65 V
Date Time Interrogation Session: 20170602105140
HIGH POWER IMPEDANCE MEASURED VALUE: 33 Ohm
HIGH POWER IMPEDANCE MEASURED VALUE: 33 Ohm
HIGH POWER IMPEDANCE MEASURED VALUE: 34 Ohm
HIGH POWER IMPEDANCE MEASURED VALUE: 34 Ohm
HIGH POWER IMPEDANCE MEASURED VALUE: 34 Ohm
HIGH POWER IMPEDANCE MEASURED VALUE: 34 Ohm
HIGH POWER IMPEDANCE MEASURED VALUE: 34 Ohm
HIGH POWER IMPEDANCE MEASURED VALUE: 35 Ohm
HIGH POWER IMPEDANCE MEASURED VALUE: 35 Ohm
HIGH POWER IMPEDANCE MEASURED VALUE: 36 Ohm
HIGH POWER IMPEDANCE MEASURED VALUE: 41 Ohm
HIGH POWER IMPEDANCE MEASURED VALUE: 41 Ohm
HIGH POWER IMPEDANCE MEASURED VALUE: 42 Ohm
HIGH POWER IMPEDANCE MEASURED VALUE: 45 Ohm
HIGH POWER IMPEDANCE MEASURED VALUE: 45 Ohm
HIGH POWER IMPEDANCE MEASURED VALUE: 45 Ohm
HighPow Impedance: 33 Ohm
HighPow Impedance: 36 Ohm
HighPow Impedance: 37 Ohm
HighPow Impedance: 38 Ohm
HighPow Impedance: 38 Ohm
HighPow Impedance: 39 Ohm
HighPow Impedance: 41 Ohm
HighPow Impedance: 41 Ohm
HighPow Impedance: 41 Ohm
HighPow Impedance: 42 Ohm
HighPow Impedance: 42 Ohm
HighPow Impedance: 42 Ohm
HighPow Impedance: 43 Ohm
HighPow Impedance: 43 Ohm
HighPow Impedance: 45 Ohm
Implantable Lead Location: 753860
Lead Channel Impedance Value: 448 Ohm
Lead Channel Impedance Value: 448 Ohm
Lead Channel Impedance Value: 464 Ohm
Lead Channel Impedance Value: 464 Ohm
Lead Channel Impedance Value: 464 Ohm
Lead Channel Impedance Value: 464 Ohm
Lead Channel Impedance Value: 464 Ohm
Lead Channel Impedance Value: 488 Ohm
Lead Channel Pacing Threshold Amplitude: 2 V
Lead Channel Pacing Threshold Pulse Width: 0.06 ms
Lead Channel Sensing Intrinsic Amplitude: 12.5 mV
Lead Channel Sensing Intrinsic Amplitude: 13.5 mV
Lead Channel Sensing Intrinsic Amplitude: 13.8 mV
Lead Channel Sensing Intrinsic Amplitude: 14 mV
Lead Channel Sensing Intrinsic Amplitude: 15.5 mV
Lead Channel Sensing Intrinsic Amplitude: 16.2 mV
Lead Channel Sensing Intrinsic Amplitude: 16.3 mV
Lead Channel Setting Sensing Sensitivity: 0.3 mV
MDC IDC LEAD IMPLANT DT: 20081215
MDC IDC MSMT LEADCHNL RV IMPEDANCE VALUE: 464 Ohm
MDC IDC MSMT LEADCHNL RV IMPEDANCE VALUE: 472 Ohm
MDC IDC MSMT LEADCHNL RV IMPEDANCE VALUE: 472 Ohm
MDC IDC MSMT LEADCHNL RV IMPEDANCE VALUE: 480 Ohm
MDC IDC MSMT LEADCHNL RV IMPEDANCE VALUE: 488 Ohm
MDC IDC MSMT LEADCHNL RV IMPEDANCE VALUE: 504 Ohm
MDC IDC MSMT LEADCHNL RV IMPEDANCE VALUE: 512 Ohm
MDC IDC MSMT LEADCHNL RV SENSING INTR AMPL: 12.8 mV
MDC IDC MSMT LEADCHNL RV SENSING INTR AMPL: 13.9 mV
MDC IDC MSMT LEADCHNL RV SENSING INTR AMPL: 14 mV
MDC IDC MSMT LEADCHNL RV SENSING INTR AMPL: 14.1 mV
MDC IDC MSMT LEADCHNL RV SENSING INTR AMPL: 14.8 mV
MDC IDC MSMT LEADCHNL RV SENSING INTR AMPL: 15.4 mV
MDC IDC MSMT LEADCHNL RV SENSING INTR AMPL: 16 mV
MDC IDC SET LEADCHNL RV PACING AMPLITUDE: 2.5 V
MDC IDC SET LEADCHNL RV PACING PULSEWIDTH: 0.4 ms
MDC IDC STAT BRADY RV PERCENT PACED: 0 %

## 2015-12-11 NOTE — Patient Instructions (Signed)
Medication Instructions: - Your physician recommends that you continue on your current medications as directed. Please refer to the Current Medication list given to you today.  Labwork: - none  Procedures/Testing: - none  Follow-Up: - Remote monitoring is used to monitor your Pacemaker of ICD from home. This monitoring reduces the number of office visits required to check your device to one time per year. It allows [place] to keep an eye on the functioning of your device to ensure it is working properly. You are scheduled for a device check from home on 03/14/16. You may send your transmission at any time that day. If you have a wireless device, the transmission will be sent automatically. After your physician reviews your transmission, you will receive a postcard with your next transmission date.  - Your physician wants you to follow-up in: 1 year with Tommye Standard, PA for Dr. Caryl Comes. You will receive a reminder letter in the mail two months in advance. If you don't receive a letter, please call our office to schedule the follow-up appointment.  Any Additional Special Instructions Will Be Listed Below (If Applicable).     If you need a refill on your cardiac medications before your next appointment, please call your pharmacy.

## 2015-12-11 NOTE — Progress Notes (Signed)
Patient Care Team: Biagio Borg, MD as PCP - General   HPI  [name] [name] is a 75 y.o. female seen in followup for an ICD implanted 2008 for primary prevention in the setting of nonischemic heart disease. She has chronic systolic heart failure with a relatively narrow QRS and underwent single-chamber defibrillator implantation.   The patient denies chest pain,  nocturnal dyspnea, orthopnea or peripheral edema.  There have been no lightheadedness or syncope.   Her biggest concern is that her bad days are characterized by fatigue. She sleeps poorly. She can't go to sleep. She doesn't think that she   snores. [name] [name] in November 2008 showed an ejection fraction of 34%. There was mild apical thinning and a mild degree of ischemia cannot be excluded. Low risk.  Last echocardiogram in March of 2015 showed an ejection fraction of 15-20%  ECG 2/15 demonstrated frequent PVCs. Holter monitor had demonstrated 11% PVCs and I asked [facility] to consider catheter ablation. Her symptoms seem to be improved and her PVC burden by ECG had diminished and so medical therapy was recommended. These notes were reviewed. She is also seeing Dr. London Sheer the interim.  3/17 creatinine 1.65    Past Medical History  Diagnosis Date  . ALLERGIC RHINITIS 11/06/2007  . 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 ANEMIA-NOS 06/17/2008  . ANXIETY 02/22/2008  . CARDIOMYOPATHY, PRIMARY, DILATED 12/30/2008  . [place] DISEASE, 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  . GERD 05/07/2007  . HYPERLIPIDEMIA 05/23/2007  . HYPERTENSION 05/07/2007  . 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 MENOPAUSAL DISORDER 05/06/2008  . 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 OVERACTIVE BLADDER 08/03/2010  . PYELONEPHRITIS, HX OF 05/07/2007  . SYSTOLIC HEART FAILURE, CHRONIC 12/28/2008  . VITAMIN D DEFICIENCY 06/17/2008    Past Surgical History  Procedure Laterality Date  . Laminectomy    . Abdominal hysterectomy      Current Outpatient Prescriptions   Medication Sig Dispense Refill  . albuterol (PROVENTIL HFA;VENTOLIN HFA) 108 (90 BASE) MCG/ACT inhaler Inhale 2 puffs into the lungs every 6 (six) hours as needed for wheezing or shortness of breath. 3 Inhaler 3  . ALPRAZolam (XANAX) 0.25 MG tablet Take 1 tablet (0.25 mg total) by mouth 2 (two) times daily as needed for anxiety. 60 tablet 1  . butalbital-acetaminophen-caffeine (FIORICET, ESGIC) 50-325-40 MG tablet TAKE 1 TABLET BY MOUTH TWICE A DAY AS NEEDED FOR HEADACHE 30 tablet 0  . carvedilol (COREG) 12.5 MG tablet TAKE 1 TABLET BY MOUTH TWICE A DAY 180 tablet 1  . Cholecalciferol (VITAMIN D3) 1000 UNITS CAPS Take 1 capsule by mouth daily.     . diclofenac (VOLTAREN) 75 MG EC tablet Take 1 tablet (75 mg total) by mouth 2 (two) times daily. 60 tablet 2  . esomeprazole (NEXIUM) 40 MG capsule Take 40 mg by mouth daily at 12 noon.    [name] estradiol (ESTRACE) 1 MG tablet Take 1 tablet (1 mg total) by mouth daily. 90 tablet 1  . furosemide (LASIX) 40 MG tablet Take 1 tablet (40 mg total) by mouth 2 (two) times daily. 180 tablet 3  . promethazine (PHENERGAN) 25 MG tablet Take 1 tablet (25 mg total) by mouth every 6 (six) hours as needed for nausea or vomiting. 40 tablet 1  . rosuvastatin (CRESTOR) 10 MG tablet Take 1 tablet (10 mg total) by mouth daily. 90 tablet 3  . sacubitril-valsartan (ENTRESTO) 24-26 MG Take 1 tablet by mouth 2 (two) times daily. 60 tablet 6  . spironolactone (  ALDACTONE) 25 MG tablet Take 1 tablet (25 mg total) by mouth daily. 90 tablet 1  . tiZANidine (ZANAFLEX) 4 MG tablet TAKE ONE TABLET BY MOUTH EVERY SIX HOURS AS NEEDED 60 tablet 4  . triamcinolone (NASACORT AQ) 55 MCG/ACT AERO nasal inhaler Place 2 sprays into the nose daily. 1 Inhaler 12   No current facility-administered medications for this visit.    Allergies  Allergen Reactions  . Zocor [Simvastatin - High Dose] Nausea Only    Nausea     Review of Systems negative except from HPI and PMH  Physical Exam BP  114/64 mmHg  Pulse 84  Ht 5\' 7"  (1.702 m)  Wt 216 lb 9.6 oz (98.249 kg)  BMI 33.92 kg/m2  SpO2 96% Well developed and nourished in no acute distress HENT normal Neck supple with JVP-flat Clear There is an irregular rate and rhythm with interpolated extrasystoles without a change in the palpated rate  Abd-soft with active BS No Clubbing cyanosis edema Skin-warm and dry A & Oriented  Grossly normal sensory and motor function    Assessment and  Plan  Nonischemic cardiomyopathy    Sleep disturbance  Implantable defibrillator-[employer]   Congestive heart failure-chronic-systolic   She is euvolemic.  Preventive these are characterized by fatigue and her sleep hygiene is poor. I recommended diphenhydramine and to follow-up with Dr. Willaim Bane. I've also suggested that she take over-the-counter magnesium for her leg cramps

## 2015-12-12 LAB — BASIC METABOLIC PANEL
BUN: 28 mg/dL — AB (ref 7–25)
CALCIUM: 9 mg/dL (ref 8.6–10.4)
CHLORIDE: 103 mmol/L (ref 98–110)
CO2: 27 mmol/L (ref 20–31)
CREATININE: 1.54 mg/dL — AB (ref 0.60–0.93)
Glucose, Bld: 96 mg/dL (ref 65–99)
Potassium: 4.9 mmol/L (ref 3.5–5.3)
Sodium: 138 mmol/L (ref 135–146)

## 2015-12-23 ENCOUNTER — Encounter: Payer: Self-pay | Admitting: Cardiology

## 2016-01-11 NOTE — Progress Notes (Signed)
HPI: FU nonischemic cardiomyopathy. Most recent nuclear study in November 2008 showed an ejection fraction of 34%. There was mild apical thinning and a mild degree of ischemia cannot be excluded. Low risk. We have treated her medically. 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 Last echocardiogram in March of 2015 showed an ejection fraction of 15-20% and mild to moderate mitral regurgitation. 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 Patient was referred to Dr. Lovena [name] by Dr. Caryl [name] for consideration of ablation but Dr. Lovena [name] felt medical therapy indicated. Since I last saw her, the patient has dyspnea with more extreme activities but not with routine activities. It is relieved with rest. It is not associated with chest pain. There is no orthopnea, PND or pedal edema. There is no syncope or palpitations. There is no exertional chest pain.   Current Outpatient Prescriptions  Medication Sig Dispense Refill  . albuterol (PROVENTIL HFA;VENTOLIN HFA) 108 (90 BASE) MCG/ACT inhaler Inhale 2 puffs into the lungs every 6 (six) hours as needed for wheezing or shortness of breath. 3 Inhaler 3  . ALPRAZolam (XANAX) 0.25 MG tablet Take 1 tablet (0.25 mg total) by mouth 2 (two) times daily as needed for anxiety. 60 tablet 1  . butalbital-acetaminophen-caffeine (FIORICET, ESGIC) 50-325-40 MG tablet TAKE 1 TABLET BY MOUTH TWICE A DAY AS NEEDED FOR HEADACHE 30 tablet 0  . carvedilol (COREG) 12.5 MG tablet TAKE 1 TABLET BY MOUTH TWICE A DAY 180 tablet 1  . Cholecalciferol (VITAMIN D3) 1000 UNITS CAPS Take 1 capsule by mouth daily.     . diclofenac (VOLTAREN) 75 MG EC tablet Take 1 tablet (75 mg total) by mouth 2 (two) times daily. 60 tablet 2  . esomeprazole (NEXIUM) 40 MG capsule Take 40 mg by mouth daily at 12 noon.    [name] estradiol (ESTRACE) 1 MG tablet TAKE 1 TABLET BY MOUTH EVERY DAY 90 tablet 1  . furosemide (LASIX) 40 MG tablet Take 1 tablet (40 mg total) by mouth 2 (two)  times daily. 180 tablet 3  . promethazine (PHENERGAN) 25 MG tablet Take 1 tablet (25 mg total) by mouth every 6 (six) hours as needed for nausea or vomiting. 40 tablet 1  . sacubitril-valsartan (ENTRESTO) 24-26 MG Take 1 tablet by mouth 2 (two) times daily. 60 tablet 6  . spironolactone (ALDACTONE) 25 MG tablet Take 1 tablet (25 mg total) by mouth daily. 90 tablet 1  . tiZANidine (ZANAFLEX) 4 MG tablet TAKE ONE TABLET BY MOUTH EVERY SIX HOURS AS NEEDED 60 tablet 4  . triamcinolone (NASACORT AQ) 55 MCG/ACT AERO nasal inhaler Place 2 sprays into the nose daily. 1 Inhaler 12   No current facility-administered medications for this visit.     Past Medical History  Diagnosis Date  . ALLERGIC RHINITIS 11/06/2007  . 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 ANEMIA-NOS 06/17/2008  . ANXIETY 02/22/2008  . CARDIOMYOPATHY, PRIMARY, DILATED 12/30/2008  . [place] DISEASE, 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  . GERD 05/07/2007  . HYPERLIPIDEMIA 05/23/2007  . HYPERTENSION 05/07/2007  . 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 MENOPAUSAL DISORDER 05/06/2008  . 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 OVERACTIVE BLADDER 08/03/2010  . PYELONEPHRITIS, HX OF 05/07/2007  . SYSTOLIC HEART FAILURE, CHRONIC 12/28/2008  . VITAMIN D DEFICIENCY 06/17/2008    Past Surgical History  Procedure Laterality Date  . Laminectomy    . Abdominal hysterectomy      Social History   Social History  . Marital Status:  Widowed    Spouse Name: N/A  . Number of Children: 3  . Years of Education: N/A   Occupational History  . 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 Smoking status: Never Smoker   . Smokeless tobacco: Not on file  . Alcohol Use: No  . Drug Use: No  . Sexual Activity: Not on file   Other Topics Concern  . Not on file   Social History Narrative    Family History  Problem Relation Age of Onset  . Arthritis Other   . Coronary artery disease Other   . Heart disease Mother     ROS: no  fevers or chills, productive cough, hemoptysis, dysphasia, odynophagia, melena, hematochezia, dysuria, hematuria, rash, seizure activity, orthopnea, PND, pedal edema, claudication. Remaining systems are negative.  Physical Exam: Well-developed well-nourished in no acute distress.  Skin is warm and dry.  HEENT is normal.  Neck is supple.  Chest is clear to auscultation with normal expansion.  Cardiovascular exam is regular rate and rhythm.  Abdominal exam nontender or distended. No masses palpated. Extremities show no edema. neuro grossly intact   Assessment and plan  1 ICD-management per electrophysiology. 2 Cardiomyopathy-continue beta blocker and entresto. Continue spironolactone. I cannot advance medications as her blood pressure is borderline at home with systolic of [id number] and she has some dizziness at times. 3 hypertension-blood pressure controlled. Continue present medications. 4 hyperlipidemia-continue statin. 5 PVCs-continue beta blocker. Previously seen by Dr. Lovena [name] and watchful waiting recommended. 6 chronic systolic congestive heart failure -patient appears to be euvolemic. Continue present dose of Lasix.  [name] Ruths, MD

## 2016-01-12 ENCOUNTER — Other Ambulatory Visit: Payer: Self-pay | Admitting: Internal Medicine

## 2016-01-15 ENCOUNTER — Encounter: Payer: Self-pay | Admitting: Cardiology

## 2016-01-15 ENCOUNTER — Ambulatory Visit (INDEPENDENT_AMBULATORY_CARE_PROVIDER_SITE_OTHER): Payer: Medicare Other | Admitting: Cardiology

## 2016-01-15 VITALS — BP 127/69 | HR 83 | Ht 67.0 in | Wt 236.0 lb

## 2016-01-15 DIAGNOSIS — I5022 Chronic systolic (congestive) heart failure: Secondary | ICD-10-CM | POA: Diagnosis not present

## 2016-01-15 DIAGNOSIS — E785 Hyperlipidemia, unspecified: Secondary | ICD-10-CM

## 2016-01-15 DIAGNOSIS — I493 Ventricular premature depolarization: Secondary | ICD-10-CM | POA: Diagnosis not present

## 2016-01-15 DIAGNOSIS — I10 Essential (primary) hypertension: Secondary | ICD-10-CM

## 2016-01-15 DIAGNOSIS — Z95810 Presence of automatic (implantable) cardiac defibrillator: Secondary | ICD-10-CM

## 2016-01-15 NOTE — Patient Instructions (Signed)
Your physician wants you to follow-up in: 6 months with Dr. Stanford Breed. You will receive a reminder letter in the mail two months in advance. If you don't receive a letter, please call our office to schedule the follow-up appointment.  If you need a refill on your cardiac medications before your next appointment, please call your pharmacy.

## 2016-01-22 ENCOUNTER — Other Ambulatory Visit: Payer: Self-pay | Admitting: Internal Medicine

## 2016-02-01 NOTE — Progress Notes (Signed)
This encounter was created in error - please disregard.

## 2016-02-09 ENCOUNTER — Other Ambulatory Visit: Payer: Self-pay | Admitting: *Deleted

## 2016-02-09 DIAGNOSIS — I5022 Chronic systolic (congestive) heart failure: Secondary | ICD-10-CM

## 2016-02-09 MED ORDER — SPIRONOLACTONE 25 MG PO TABS: 25.0000 mg | ORAL_TABLET | Freq: Every day | ORAL | 0 refills | Status: DC

## 2016-02-09 MED ORDER — TIZANIDINE HCL 4 MG PO TABS: 4.0000 mg | ORAL_TABLET | Freq: Four times a day (QID) | ORAL | 0 refills | Status: DC | PRN

## 2016-02-15 ENCOUNTER — Other Ambulatory Visit: Payer: Self-pay

## 2016-02-15 DIAGNOSIS — I5022 Chronic systolic (congestive) heart failure: Secondary | ICD-10-CM

## 2016-02-16 MED ORDER — ALPRAZOLAM 0.25 MG PO TABS: 0.2500 mg | ORAL_TABLET | Freq: Two times a day (BID) | ORAL | 2 refills | Status: DC | PRN

## 2016-02-16 MED ORDER — FUROSEMIDE 40 MG PO TABS: 40.0000 mg | ORAL_TABLET | Freq: Two times a day (BID) | ORAL | 3 refills | Status: DC

## 2016-02-16 MED ORDER — DICLOFENAC SODIUM 75 MG PO TBEC: 75.0000 mg | DELAYED_RELEASE_TABLET | Freq: Two times a day (BID) | ORAL | 5 refills | Status: DC

## 2016-02-16 NOTE — Telephone Encounter (Signed)
Medication refill sent to pharmacy  

## 2016-02-16 NOTE — Telephone Encounter (Signed)
.  d.done Done hardcopy to [facility]

## 2016-02-25 DIAGNOSIS — H5203 Hypermetropia, bilateral: Secondary | ICD-10-CM | POA: Diagnosis not present

## 2016-02-25 DIAGNOSIS — H52223 Regular astigmatism, bilateral: Secondary | ICD-10-CM | POA: Diagnosis not present

## 2016-02-25 DIAGNOSIS — H524 Presbyopia: Secondary | ICD-10-CM | POA: Diagnosis not present

## 2016-02-26 ENCOUNTER — Other Ambulatory Visit: Payer: Self-pay | Admitting: Cardiology

## 2016-02-26 DIAGNOSIS — I5022 Chronic systolic (congestive) heart failure: Secondary | ICD-10-CM

## 2016-02-26 NOTE — Telephone Encounter (Signed)
Rx request sent to pharmacy.  

## 2016-03-15 ENCOUNTER — Ambulatory Visit (INDEPENDENT_AMBULATORY_CARE_PROVIDER_SITE_OTHER): Payer: Medicare Other | Admitting: *Deleted

## 2016-03-15 DIAGNOSIS — Z95810 Presence of automatic (implantable) cardiac defibrillator: Secondary | ICD-10-CM

## 2016-03-15 DIAGNOSIS — I429 Cardiomyopathy, unspecified: Secondary | ICD-10-CM

## 2016-03-15 DIAGNOSIS — I428 Other cardiomyopathies: Secondary | ICD-10-CM

## 2016-03-15 NOTE — Progress Notes (Signed)
Remote ICD transmission.   

## 2016-03-16 LAB — CUP PACEART REMOTE DEVICE CHECK
Date Time Interrogation Session: 20170904110400
HIGH POWER IMPEDANCE MEASURED VALUE: 39 Ohm
Implantable Lead Implant Date: 20081215
Implantable Lead Location: 753860
Lead Channel Setting Pacing Pulse Width: 0.4 ms
Lead Channel Setting Sensing Sensitivity: 0.3 mV
MDC IDC MSMT BATTERY VOLTAGE: 2.64 V
MDC IDC MSMT LEADCHNL RV IMPEDANCE VALUE: 480 Ohm
MDC IDC MSMT LEADCHNL RV SENSING INTR AMPL: 15.4 mV
MDC IDC SET LEADCHNL RV PACING AMPLITUDE: 2.5 V

## 2016-03-18 ENCOUNTER — Encounter: Payer: Self-pay | Admitting: Cardiology

## 2016-03-29 ENCOUNTER — Other Ambulatory Visit: Payer: Self-pay | Admitting: Internal Medicine

## 2016-03-29 DIAGNOSIS — I5022 Chronic systolic (congestive) heart failure: Secondary | ICD-10-CM

## 2016-05-05 ENCOUNTER — Other Ambulatory Visit: Payer: Self-pay | Admitting: Internal Medicine

## 2016-05-12 ENCOUNTER — Encounter: Payer: Self-pay | Admitting: Internal Medicine

## 2016-05-12 ENCOUNTER — Other Ambulatory Visit: Payer: Self-pay | Admitting: Cardiology

## 2016-05-12 ENCOUNTER — Ambulatory Visit (INDEPENDENT_AMBULATORY_CARE_PROVIDER_SITE_OTHER): Payer: Medicare Other | Admitting: Internal Medicine

## 2016-05-12 ENCOUNTER — Other Ambulatory Visit (INDEPENDENT_AMBULATORY_CARE_PROVIDER_SITE_OTHER): Payer: Self-pay

## 2016-05-12 VITALS — BP 138/78 | HR 84 | Temp 98.1°F | Resp 20 | Wt 220.0 lb

## 2016-05-12 DIAGNOSIS — R5383 Other fatigue: Secondary | ICD-10-CM | POA: Diagnosis not present

## 2016-05-12 DIAGNOSIS — M1710 Unilateral primary osteoarthritis, unspecified knee: Secondary | ICD-10-CM

## 2016-05-12 DIAGNOSIS — Z0000 Encounter for general adult medical examination without abnormal findings: Secondary | ICD-10-CM

## 2016-05-12 DIAGNOSIS — I10 Essential (primary) hypertension: Secondary | ICD-10-CM

## 2016-05-12 DIAGNOSIS — IMO0002 Reserved for concepts with insufficient information to code with codable children: Secondary | ICD-10-CM

## 2016-05-12 DIAGNOSIS — E669 Obesity, unspecified: Secondary | ICD-10-CM

## 2016-05-12 LAB — LIPID PANEL
CHOL/HDL RATIO: 3
Cholesterol: 203 mg/dL — ABNORMAL HIGH (ref 0–200)
HDL: 58.4 mg/dL (ref >39.00)
LDL CALC: 109 mg/dL — AB (ref 0–99)
NonHDL: 144.6
TRIGLYCERIDES: 178 mg/dL — AB (ref 0.0–149.0)
VLDL: 35.6 mg/dL (ref 0.0–40.0)

## 2016-05-12 LAB — URINALYSIS, ROUTINE W REFLEX MICROSCOPIC
BILIRUBIN URINE: NEGATIVE
Hgb urine dipstick: NEGATIVE
KETONES UR: NEGATIVE
NITRITE: NEGATIVE
PH: 7 (ref 5.0–8.0)
Specific Gravity, Urine: 1.015 (ref 1.000–1.030)
TOTAL PROTEIN, URINE-UPE24: NEGATIVE
UROBILINOGEN UA: 0.2 (ref 0.0–1.0)
Urine Glucose: NEGATIVE

## 2016-05-12 LAB — HEPATIC FUNCTION PANEL
ALT: 11 U/L (ref 0–35)
AST: 12 U/L (ref 0–37)
Albumin: 3.8 g/dL (ref 3.5–5.2)
Alkaline Phosphatase: 60 U/L (ref 39–117)
BILIRUBIN TOTAL: 0.3 mg/dL (ref 0.2–1.2)
Bilirubin, Direct: 0 mg/dL (ref 0.0–0.3)
Total Protein: 7.1 g/dL (ref 6.0–8.3)

## 2016-05-12 LAB — CBC WITH DIFFERENTIAL/PLATELET
BASOS PCT: 0.7 % (ref 0.0–3.0)
Basophils Absolute: 0 10*3/uL (ref 0.0–0.1)
EOS PCT: 1.7 % (ref 0.0–5.0)
Eosinophils Absolute: 0.1 10*3/uL (ref 0.0–0.7)
HEMATOCRIT: 33.1 % — AB (ref 36.0–46.0)
HEMOGLOBIN: 10.9 g/dL — AB (ref 12.0–15.0)
LYMPHS PCT: 54.2 % — AB (ref 12.0–46.0)
Lymphs Abs: 3.9 10*3/uL (ref 0.7–4.0)
MCHC: 32.8 g/dL (ref 30.0–36.0)
MCV: 81.2 fl (ref 78.0–100.0)
MONO ABS: 0.5 10*3/uL (ref 0.1–1.0)
MONOS PCT: 7.3 % (ref 3.0–12.0)
NEUTROS PCT: 36.1 % — AB (ref 43.0–77.0)
Neutro Abs: 2.6 10*3/uL (ref 1.4–7.7)
PLATELETS: 216 10*3/uL (ref 150.0–400.0)
RBC: 4.08 Mil/uL (ref 3.87–5.11)
RDW: 15.7 % — AB (ref 11.5–15.5)
WBC: 7.2 10*3/uL (ref 4.0–10.5)

## 2016-05-12 LAB — TSH: TSH: 0.6 u[IU]/mL (ref 0.35–4.50)

## 2016-05-12 LAB — BASIC METABOLIC PANEL
BUN: 19 mg/dL (ref 6–23)
CO2: 29 mEq/L (ref 19–32)
Calcium: 9.6 mg/dL (ref 8.4–10.5)
Chloride: 105 mEq/L (ref 96–112)
Creatinine, Ser: 1.33 mg/dL — ABNORMAL HIGH (ref 0.40–1.20)
GFR: 50.23 mL/min — AB (ref >60.00)
Glucose, Bld: 94 mg/dL (ref 70–99)
POTASSIUM: 4.9 meq/L (ref 3.5–5.1)
Sodium: 139 mEq/L (ref 135–145)

## 2016-05-12 NOTE — Progress Notes (Signed)
Subjective:    Patient ID: [name] [name], female    DOB: [birth year], 75 y.o.   MRN: [id number]  HPI  Here for wellness and f/u;  Overall doing ok;  Pt denies Chest pain, worsening SOB, DOE, wheezing, orthopnea, PND, worsening LE edema, palpitations, dizziness or syncope.  Pt denies neurological change such as new headache, facial or extremity weakness.  Pt denies polydipsia, polyuria, or low sugar symptoms. Pt states overall good compliance with treatment and medications, good tolerability, and has been trying to follow appropriate diet.  Pt denies worsening depressive symptoms, suicidal ideation or panic. No fever, night sweats, wt loss, loss of appetite, or other constitutional symptoms.  Pt states good ability with ADL's, has low fall risk, home safety reviewed and adequate, no other significant changes in hearing or vision, and only occasionally active with exercise, due to chronic right knee pain, has been increasingly severe in the past month, sharp, intermittent and occasional swelling, but no giveaways or falls. Does c/o ongoing fatigue, but denies signficant daytime hypersomnolence.   Asks for phentermine for wt loss due to unable to exercise well.  Does not want colonoscopy if can be avoided since she is higher risk and would have to have done at the hospital.  Past Medical History:  Diagnosis Date  . ALLERGIC RHINITIS 11/06/2007  . 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 ANEMIA-NOS 06/17/2008  . ANXIETY 02/22/2008  . CARDIOMYOPATHY, PRIMARY, DILATED 12/30/2008  . [place] DISEASE, 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  . GERD 05/07/2007  . HYPERLIPIDEMIA 05/23/2007  . HYPERTENSION 05/07/2007  . 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 MENOPAUSAL DISORDER 05/06/2008  . 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 OVERACTIVE BLADDER 08/03/2010  . PYELONEPHRITIS, HX OF 05/07/2007  . SYSTOLIC HEART FAILURE, CHRONIC 12/28/2008  . VITAMIN D DEFICIENCY 06/17/2008   Past Surgical History:  Procedure  Laterality Date  . ABDOMINAL HYSTERECTOMY    . LAMINECTOMY      reports that she has never smoked. She does not have any smokeless tobacco history on file. She reports that she does not drink alcohol or use drugs. family history includes Arthritis in her other; Coronary artery disease in her other; Heart disease in her mother. Allergies  Allergen Reactions  . Zocor [Simvastatin - High Dose] Nausea Only    Nausea    Current Outpatient Prescriptions on File Prior to Visit  Medication Sig Dispense Refill  . albuterol (PROVENTIL HFA;VENTOLIN HFA) 108 (90 BASE) MCG/ACT inhaler Inhale 2 puffs into the lungs every 6 (six) hours as needed for wheezing or shortness of breath. 3 Inhaler 3  . ALPRAZolam (XANAX) 0.25 MG tablet Take 1 tablet (0.25 mg total) by mouth 2 (two) times daily as needed for anxiety. 60 tablet 2  . butalbital-acetaminophen-caffeine (FIORICET, ESGIC) 50-325-40 MG tablet TAKE 1 TABLET BY MOUTH TWICE A DAY AS NEEDED FOR HEADACHE 30 tablet 0  . Cholecalciferol (VITAMIN D3) 1000 UNITS CAPS Take 1 capsule by mouth daily.     . diclofenac (VOLTAREN) 75 MG EC tablet Take 1 tablet (75 mg total) by mouth 2 (two) times daily. 60 tablet 5  . esomeprazole (NEXIUM) 40 MG capsule Take 40 mg by mouth daily at 12 noon.    [name] estradiol (ESTRACE) 1 MG tablet TAKE 1 TABLET BY MOUTH EVERY DAY 90 tablet 1  . furosemide (LASIX) 40 MG tablet Take 1 tablet (40 mg total) by mouth 2 (two) times daily. 180 tablet 3  . promethazine (PHENERGAN) 25 MG tablet Take 1 tablet (25  mg total) by mouth every 6 (six) hours as needed for nausea or vomiting. 40 tablet 1  . sacubitril-valsartan (ENTRESTO) 24-26 MG Take 1 tablet by mouth 2 (two) times daily. 60 tablet 6  . spironolactone (ALDACTONE) 25 MG tablet TAKE 1 TABLET (25 MG TOTAL) BY MOUTH DAILY. 90 tablet 3  . spironolactone (ALDACTONE) 25 MG tablet TAKE 1 TABLET BY MOUTH  DAILY. YEARLY PHYSICAL DUE  IN OCT 90 tablet 0  . tiZANidine (ZANAFLEX) 4 MG tablet TAKE 1  TABLET BY MOUTH  EVERY 6 HOURS AS NEEDED.  YEARLY PHYSICAL DUE IN OCT 180 tablet 0  . triamcinolone (NASACORT AQ) 55 MCG/ACT AERO nasal inhaler Place 2 sprays into the nose daily. 1 Inhaler 12   No current facility-administered medications on file prior to visit.    Review of Systems Constitutional: Negative for increased diaphoresis, or other activity, appetite or siginficant weight change other than noted HENT: Negative for worsening hearing loss, ear pain, facial swelling, mouth sores and neck stiffness.   Eyes: Negative for other worsening pain, redness or visual disturbance.  Respiratory: Negative for choking or stridor Cardiovascular: Negative for other chest pain and palpitations.  Gastrointestinal: Negative for worsening diarrhea, blood in stool, or abdominal distention Genitourinary: Negative for hematuria, flank pain or change in urine volume.  Musculoskeletal: Negative for myalgias or other joint complaints.  Skin: Negative for other color change and wound or drainage.  Neurological: Negative for syncope and numbness. other than noted Hematological: Negative for adenopathy. or other swelling Psychiatric/Behavioral: Negative for hallucinations, SI, self-injury, decreased concentration or other worsening agitation.  All other system neg per pt    Objective:   Physical Exam BP 138/78   Pulse 84   Temp 98.1 F (36.7 C) (Oral)   Resp 20   Wt 220 lb (99.8 kg)   SpO2 96%   BMI 34.46 kg/m  VS noted, not ill appering, obese Constitutional: Pt is oriented to person, place, and time. Appears well-developed and well-nourished, in no significant distress Head: Normocephalic and atraumatic  Eyes: Conjunctivae and EOM are normal. Pupils are equal, round, and reactive to light Right Ear: External ear normal.  Left Ear: External ear normal Nose: Nose normal.  Mouth/Throat: Oropharynx is clear and moist  Neck: Normal range of motion. Neck supple. No JVD present. No tracheal deviation  present or significant neck LA or mass Cardiovascular: Normal rate, regular rhythm, normal heart sounds and intact distal pulses.   Pulmonary/Chest: Effort normal and breath sounds without rales or wheezing  Abdominal: Soft. Bowel sounds are normal. NT. No HSM  Musculoskeletal: Normal range of motion. Exhibits no edema Lymphadenopathy: Has no cervical adenopathy.  Neurological: Pt is alert and oriented to person, place, and time. Pt has normal reflexes. No cranial nerve deficit. Motor grossly intact Skin: Skin is warm and dry. No rash noted or new ulcers Psychiatric:  Has normal mood and affect. Behavior is normal. Not depressed affect    Assessment & Plan:

## 2016-05-12 NOTE — Patient Instructions (Addendum)
You will be contacted regarding the referral for: Cologuard  Please continue all other medications as before, and refills have been done if requested.  Please have the pharmacy call with any other refills you may need.  Please continue your efforts at being more active, low cholesterol diet, and weight control.  You are otherwise up to date with prevention measures today.  Please keep your appointments with your specialists as you may have planned  Please go to the LAB in the Basement (turn left off the elevator) for the tests to be done today  You will be contacted by phone if any changes need to be made immediately.  Otherwise, you will receive a letter about your results with an explanation, but please check with MyChart first.  Please remember to sign up for MyChart if you have not done so, as this will be important to you in the future with finding out test results, communicating by private email, and scheduling acute appointments online when needed.  Please return in 6 months, or sooner if needed

## 2016-05-12 NOTE — Progress Notes (Signed)
Pre visit review using our clinic review tool, if applicable. No additional management support is needed unless otherwise documented below in the visit note. 

## 2016-05-13 NOTE — Assessment & Plan Note (Signed)
Suspect related to deconditioning, cardiac/CHF, obesity, denies OSA symptoms. Cont tx directed at underlying conditions

## 2016-05-13 NOTE — Assessment & Plan Note (Signed)
Would hold on med for this due to higher risk cardiovascular

## 2016-05-13 NOTE — Assessment & Plan Note (Signed)

## 2016-05-13 NOTE — Assessment & Plan Note (Signed)
D/w pt, cont tylenol pain control, nsaid prn, and pt states will self refer back to ortho, or f/u with Sport [facility] in this office.

## 2016-05-13 NOTE — Assessment & Plan Note (Signed)
stable overall by history and exam, recent data reviewed with pt, and pt to continue medical treatment as before,  to f/u any worsening symptoms or concerns BP Readings from Last 3 Encounters:  05/12/16 138/78  01/15/16 127/69  12/11/15 114/64

## 2016-05-17 ENCOUNTER — Ambulatory Visit: Payer: Self-pay | Admitting: Cardiology

## 2016-05-20 ENCOUNTER — Ambulatory Visit: Payer: Self-pay | Admitting: Cardiology

## 2016-05-23 DIAGNOSIS — Z1231 Encounter for screening mammogram for malignant neoplasm of breast: Secondary | ICD-10-CM | POA: Diagnosis not present

## 2016-05-23 LAB — HM MAMMOGRAPHY: HM Mammogram: NORMAL (ref 0–4)

## 2016-05-24 ENCOUNTER — Encounter: Payer: Self-pay | Admitting: Internal Medicine

## 2016-05-24 NOTE — Progress Notes (Signed)
Abstract done.

## 2016-05-26 ENCOUNTER — Telehealth: Payer: Self-pay | Admitting: Internal Medicine

## 2016-05-26 NOTE — Telephone Encounter (Signed)
Patient called to request you call her back. She is exhibiting discomfort when she urinates. She states we told her to call if she has that sx. Also increased freq.

## 2016-05-26 NOTE — Telephone Encounter (Signed)
Since her last visit was Nov 2, this has been 2 weeks, so please ask pt to come in for rov

## 2016-05-27 NOTE — Telephone Encounter (Signed)
Pt contacted the given PCP response. Pt will call back to schedule the appt as requested.

## 2016-06-14 ENCOUNTER — Telehealth: Payer: Self-pay | Admitting: Cardiology

## 2016-06-14 ENCOUNTER — Ambulatory Visit (INDEPENDENT_AMBULATORY_CARE_PROVIDER_SITE_OTHER): Payer: Medicare Other | Admitting: *Deleted

## 2016-06-14 DIAGNOSIS — I428 Other cardiomyopathies: Secondary | ICD-10-CM | POA: Diagnosis not present

## 2016-06-14 NOTE — Telephone Encounter (Signed)
Spoke with pt and reminded pt of remote transmission that is due today. Pt verbalized understanding.   

## 2016-06-15 NOTE — Progress Notes (Signed)
Remote ICD transmission.   

## 2016-06-22 ENCOUNTER — Encounter: Payer: Self-pay | Admitting: Cardiology

## 2016-06-23 ENCOUNTER — Other Ambulatory Visit: Payer: Self-pay | Admitting: Internal Medicine

## 2016-06-30 LAB — CUP PACEART REMOTE DEVICE CHECK
Battery Voltage: 2.64 V
Brady Statistic RV Percent Paced: 0 %
HIGH POWER IMPEDANCE MEASURED VALUE: 39 Ohm
Implantable Lead Implant Date: 20081215
Lead Channel Setting Pacing Pulse Width: 0.4 ms
Lead Channel Setting Sensing Sensitivity: 0.3 mV
MDC IDC LEAD LOCATION: 753860
MDC IDC MSMT LEADCHNL RV IMPEDANCE VALUE: 480 Ohm
MDC IDC MSMT LEADCHNL RV SENSING INTR AMPL: 14.3 mV
MDC IDC PG IMPLANT DT: 20081215
MDC IDC SESS DTM: 20171205193700
MDC IDC SET LEADCHNL RV PACING AMPLITUDE: 2.5 V

## 2016-08-23 NOTE — Progress Notes (Signed)
HPI: FU nonischemic cardiomyopathy. Most recent nuclear study in November 2008 showed an ejection fraction of 34%. There was mild apical thinning and a mild degree of ischemia cannot be excluded. Low risk. We have treated her medically. 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 Last echocardiogram in March of 2015 showed an ejection fraction of 15-20% and mild to moderate mitral regurgitation. 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 Patient was referred to Dr. Lovena Le by Dr. Caryl Comes for consideration of ablation but Dr. Lovena Le felt medical therapy indicated. Since I last saw her, she notes increased dyspnea and pedal edema; Also occasional dizziness but no syncope. She describes substernal chest pain intermittently that lasts 5 minutes described as a squeezing sensation. No radiation. Not pleuritic, positional or exertional. Patient is eating [place] fried chicken 3 times weekly and [employer] and [employer].   Current Outpatient Prescriptions  Medication Sig Dispense Refill  . albuterol (PROVENTIL HFA;VENTOLIN HFA) 108 (90 BASE) MCG/ACT inhaler Inhale 2 puffs into the lungs every 6 (six) hours as needed for wheezing or shortness of breath. 3 Inhaler 3  . ALPRAZolam (XANAX) 0.25 MG tablet Take 1 tablet (0.25 mg total) by mouth 2 (two) times daily as needed for anxiety. 60 tablet 2  . butalbital-acetaminophen-caffeine (FIORICET, ESGIC) 50-325-40 MG tablet TAKE 1 TABLET BY MOUTH TWICE A DAY AS NEEDED FOR HEADACHE 30 tablet 0  . carvedilol (COREG) 12.5 MG tablet TAKE 1 TABLET BY MOUTH TWICE A DAY 180 tablet 1  . Cholecalciferol (VITAMIN D3) 1000 UNITS CAPS Take 1 capsule by mouth daily.     . diclofenac (VOLTAREN) 75 MG EC tablet Take 1 tablet (75 mg total) by mouth 2 (two) times daily. 180 tablet 1  . esomeprazole (NEXIUM) 40 MG capsule Take 40 mg by mouth daily at 12 noon.    [name] estradiol (ESTRACE) 1 MG tablet TAKE 1 TABLET BY MOUTH EVERY DAY 90 tablet 1  .  furosemide (LASIX) 40 MG tablet Take 1 tablet (40 mg total) by mouth 2 (two) times daily. 180 tablet 3  . promethazine (PHENERGAN) 25 MG tablet Take 1 tablet (25 mg total) by mouth every 6 (six) hours as needed for nausea or vomiting. 40 tablet 1  . sacubitril-valsartan (ENTRESTO) 24-26 MG Take 1 tablet by mouth 2 (two) times daily. 60 tablet 6  . spironolactone (ALDACTONE) 25 MG tablet TAKE 1 TABLET (25 MG TOTAL) BY MOUTH DAILY. 90 tablet 3  . spironolactone (ALDACTONE) 25 MG tablet TAKE 1 TABLET BY MOUTH  DAILY. YEARLY PHYSICAL DUE  IN OCT 90 tablet 0  . spironolactone (ALDACTONE) 25 MG tablet TAKE 1 TABLET BY MOUTH  DAILY. 90 tablet 0  . tiZANidine (ZANAFLEX) 4 MG tablet TAKE 1 TABLET BY MOUTH  EVERY 6 HOURS AS NEEDED. 180 tablet 1  . triamcinolone (NASACORT AQ) 55 MCG/ACT AERO nasal inhaler Place 2 sprays into the nose daily. 1 Inhaler 12   No current facility-administered medications for this visit.      Past Medical History:  Diagnosis Date  . ALLERGIC RHINITIS 11/06/2007  . 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 ANEMIA-NOS 06/17/2008  . ANXIETY 02/22/2008  . CARDIOMYOPATHY, PRIMARY, DILATED 12/30/2008  . [place] DISEASE, 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  . GERD 05/07/2007  . HYPERLIPIDEMIA 05/23/2007  . HYPERTENSION 05/07/2007  . 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 MENOPAUSAL DISORDER 05/06/2008  . 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 OVERACTIVE BLADDER 08/03/2010  . PYELONEPHRITIS, HX OF 05/07/2007  .  SYSTOLIC HEART FAILURE, CHRONIC 12/28/2008  . VITAMIN D DEFICIENCY 06/17/2008    Past Surgical History:  Procedure Laterality Date  . ABDOMINAL HYSTERECTOMY    . LAMINECTOMY      Social History   Social History  . Marital status: Widowed    Spouse name: N/A  . Number of children: 3  . Years of education: N/A   Occupational History  . 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 Smoking status: Never Smoker  . Smokeless tobacco: Never Used  .  Alcohol use No  . Drug use: No  . Sexual activity: Not on file   Other Topics Concern  . Not on file   Social History Narrative  . No narrative on file    Family History  Problem Relation Age of Onset  . Arthritis Other   . Coronary artery disease Other   . Heart disease Mother     ROS: dizziness but no fevers or chills, productive cough, hemoptysis, dysphasia, odynophagia, melena, hematochezia, dysuria, hematuria, rash, seizure activity, orthopnea, PND, claudication. Remaining systems are negative.  Physical Exam: Well-developed obese in no acute distress.  Skin is warm and dry.  HEENT is normal.  Neck is supple.  Chest is clear to auscultation with normal expansion.  Cardiovascular exam is regular rate and rhythm.  Abdominal exam nontender or distended. No masses palpated. Extremities show 1+ ankle edema. neuro grossly intact  ECG-sinus rhythm with occasional PVC. Left ventricular hypertrophy. Inferior lateral T-wave inversion.  A/P  1 chronic systolic congestive heart failure-patient is volume overloaded on examination. Increase Lasix to 60 mg twice a day. In one week check potassium, renal function and BNP. She is eating regularly at [place] fried chicken, [employer] and [employer]. I instructed her on the importance of low sodium diet, fluid restriction. She will weigh daily and take an additional 40 mg of Lasix for weight gain of 2 pounds.   2 cardiomyopathy-continue beta blocker, entresto and spironolactone.   3 hypertension-blood pressure controlled. Continue present medications.  4 hyperlipidemia-continue statin.  5 prior ICD-followed by electrophysiology.  6 PVCs-continue beta blocker. Seen by Dr. Lovena Le previously and watchful waiting recommended.  7 chest pain-symptoms are somewhat atypical. Plan Lexiscan nuclear study for risk stratification.  Kirk Ruths, MD

## 2016-08-26 ENCOUNTER — Other Ambulatory Visit: Payer: Self-pay | Admitting: Internal Medicine

## 2016-08-30 ENCOUNTER — Other Ambulatory Visit: Payer: Self-pay | Admitting: Emergency Medicine

## 2016-08-30 MED ORDER — DICLOFENAC SODIUM 75 MG PO TBEC: 75.0000 mg | DELAYED_RELEASE_TABLET | Freq: Two times a day (BID) | ORAL | 1 refills | Status: DC

## 2016-08-30 NOTE — Progress Notes (Deleted)
Pre visit review using our clinic review tool, if applicable. No additional management support is needed unless otherwise documented below in the visit note. 

## 2016-08-30 NOTE — Progress Notes (Deleted)
Subjective:   [name] [name] is a 76 y.o. female who presents for Medicare Annual (Subsequent) preventive examination.  Review of Systems:  No ROS.  Medicare Wellness Visit.    Sleep patterns: {SX; SLEEP PATTERNS:18802::"feels rested on waking","does not get up to void","gets up *** times nightly to void","sleeps *** hours nightly"}.   Home Safety/Smoke Alarms:   Living environment; residence and Firearm Safety: {Rehab home environment / accessibility:30080::"no firearms","firearms stored safely"}. Seat Belt Safety/Bike Helmet: Wears seat belt.   Counseling:   Eye Exam-  Dental-  Female:   Pap-  N/A     Mammo-  Last 05/23/16, BI-RADS category 1: negative    Dexa scan-  none      CCS- none      Objective:     Vitals: There were no vitals taken for this visit.  There is no height or weight on file to calculate BMI.   Tobacco History  Smoking Status  . Never Smoker  Smokeless Tobacco  . Not on file     Counseling given: Not Answered   Past Medical History:  Diagnosis Date  . ALLERGIC RHINITIS 11/06/2007  . 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 ANEMIA-NOS 06/17/2008  . ANXIETY 02/22/2008  . CARDIOMYOPATHY, PRIMARY, DILATED 12/30/2008  . [place] DISEASE, 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  . GERD 05/07/2007  . HYPERLIPIDEMIA 05/23/2007  . HYPERTENSION 05/07/2007  . 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 MENOPAUSAL DISORDER 05/06/2008  . 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 OVERACTIVE BLADDER 08/03/2010  . PYELONEPHRITIS, HX OF 05/07/2007  . SYSTOLIC HEART FAILURE, CHRONIC 12/28/2008  . VITAMIN D DEFICIENCY 06/17/2008   Past Surgical History:  Procedure Laterality Date  . ABDOMINAL HYSTERECTOMY    . LAMINECTOMY     Family History  Problem Relation Age of Onset  . Arthritis Other   . Coronary artery disease Other   . Heart disease Mother    History  Sexual Activity  . Sexual activity: Not on file    Outpatient Encounter Prescriptions as of 08/31/2016    Medication Sig  . albuterol (PROVENTIL HFA;VENTOLIN HFA) 108 (90 BASE) MCG/ACT inhaler Inhale 2 puffs into the lungs every 6 (six) hours as needed for wheezing or shortness of breath.  . ALPRAZolam (XANAX) 0.25 MG tablet Take 1 tablet (0.25 mg total) by mouth 2 (two) times daily as needed for anxiety.  . butalbital-acetaminophen-caffeine (FIORICET, ESGIC) 50-325-40 MG tablet TAKE 1 TABLET BY MOUTH TWICE A DAY AS NEEDED FOR HEADACHE  . carvedilol (COREG) 12.5 MG tablet TAKE 1 TABLET BY MOUTH TWICE A DAY  . Cholecalciferol (VITAMIN D3) 1000 UNITS CAPS Take 1 capsule by mouth daily.   . diclofenac (VOLTAREN) 75 MG EC tablet Take 1 tablet (75 mg total) by mouth 2 (two) times daily.  [name] esomeprazole (NEXIUM) 40 MG capsule Take 40 mg by mouth daily at 12 noon.  [name] estradiol (ESTRACE) 1 MG tablet TAKE 1 TABLET BY MOUTH EVERY DAY  . furosemide (LASIX) 40 MG tablet Take 1 tablet (40 mg total) by mouth 2 (two) times daily.  . promethazine (PHENERGAN) 25 MG tablet Take 1 tablet (25 mg total) by mouth every 6 (six) hours as needed for nausea or vomiting.  . sacubitril-valsartan (ENTRESTO) 24-26 MG Take 1 tablet by mouth 2 (two) times daily.  [name] spironolactone (ALDACTONE) 25 MG tablet TAKE 1 TABLET (25 MG TOTAL) BY MOUTH DAILY.  [name] spironolactone (ALDACTONE) 25 MG tablet TAKE 1 TABLET BY MOUTH  DAILY. YEARLY PHYSICAL DUE  IN OCT  .  spironolactone (ALDACTONE) 25 MG tablet TAKE 1 TABLET BY MOUTH  DAILY.  [name] tiZANidine (ZANAFLEX) 4 MG tablet TAKE 1 TABLET BY MOUTH  EVERY 6 HOURS AS NEEDED.  [name] triamcinolone (NASACORT AQ) 55 MCG/ACT AERO nasal inhaler Place 2 sprays into the nose daily.   No facility-administered encounter medications on file as of 08/31/2016.     Activities of Daily Living No flowsheet data found.  Patient Care Team: Biagio Borg, MD as PCP - General    Assessment:    Physical assessment deferred to PCP.  Exercise Activities and Dietary recommendations   Diet (meal preparation, eat out,  water intake, caffeinated beverages, dairy products, fruits and vegetables): {Desc; diets:16563} Breakfast: Lunch:  Dinner:      Goals    . Weight < 180 lb (81.647 kg)          States she felt better when she was thinner because her knees hurt when she is heavier Eats salads; Diet is good;   Will give information on a 1800 cal Diabetic diet to monitor for calorie       Fall Risk Fall Risk  05/12/2016 05/08/2015 05/08/2015 05/01/2014 02/07/2013  Falls in the past year? No No No No No   Depression Screen PHQ 2/9 Scores 05/12/2016 05/08/2015 05/08/2015 05/01/2014  PHQ - 2 Score 0 0 0 0     Cognitive Function MMSE - Mini Mental State Exam 05/08/2015  Not completed: (No Data)        Immunization History  Administered Date(s) Administered  . Pneumococcal Conjugate-13 02/07/2013  . Pneumococcal Polysaccharide-23 05/01/2014   Screening Tests Health Maintenance  Topic Date Due  . COLONOSCOPY  11/21/1992  . DEXA SCAN  11/22/2007  . INFLUENZA VACCINE  02/09/2016  . TETANUS/TDAP  07/11/2016  . MAMMOGRAM  05/23/2018  . PNA vac Low Risk Adult  Completed      Plan:    During the course of the visit the patient was educated and counseled about the following appropriate screening and preventive services:   Vaccines to include Pneumoccal, Influenza, Hepatitis B, Td, Zostavax, HCV  Electrocardiogram  Cardiovascular Disease  Colorectal cancer screening  Bone density screening  Diabetes screening  Glaucoma screening  Mammography/PAP  Nutrition counseling   Patient Instructions (the written plan) was given to the patient.   Michiel Cowboy, RN  08/30/2016

## 2016-08-31 ENCOUNTER — Ambulatory Visit: Payer: Self-pay

## 2016-09-03 ENCOUNTER — Other Ambulatory Visit: Payer: Self-pay | Admitting: Cardiology

## 2016-09-03 DIAGNOSIS — I428 Other cardiomyopathies: Secondary | ICD-10-CM

## 2016-09-05 ENCOUNTER — Ambulatory Visit (INDEPENDENT_AMBULATORY_CARE_PROVIDER_SITE_OTHER): Payer: Medicare Other | Admitting: Cardiology

## 2016-09-05 ENCOUNTER — Encounter: Payer: Self-pay | Admitting: Cardiology

## 2016-09-05 VITALS — BP 126/72 | HR 73 | Ht 67.0 in | Wt 228.0 lb

## 2016-09-05 DIAGNOSIS — R072 Precordial pain: Secondary | ICD-10-CM | POA: Diagnosis not present

## 2016-09-05 DIAGNOSIS — I5022 Chronic systolic (congestive) heart failure: Secondary | ICD-10-CM | POA: Diagnosis not present

## 2016-09-05 MED ORDER — FUROSEMIDE 40 MG PO TABS: 60.0000 mg | ORAL_TABLET | Freq: Two times a day (BID) | ORAL | 3 refills | Status: DC

## 2016-09-05 NOTE — Telephone Encounter (Signed)
Rx(s) sent to pharmacy electronically.  

## 2016-09-05 NOTE — Patient Instructions (Signed)
Medication Instructions:   INCREASE FUROSEMIDE TO 60 MG TWICE DAILY= 1 AND 1/2 TABLETS TWICE DAILY  CAN TAKE EXTRA DOSE OF FUROSEMIDE FOR WEIGHT GAIN 2 LABS OVERNIGHT OR 5 LBS IN ONE WEEK  Labwork:  Your physician recommends that you return for lab work in:ONE WEEK=[facility]=[address]=1ST FLOOR  Testing/Procedures:  Your physician has requested that you have a lexiscan myoview. For further information please visit [email or web address]. Please follow instruction sheet, as given.    Follow-Up:  Your physician recommends that you schedule a follow-up appointment in: [place]

## 2016-09-06 NOTE — Progress Notes (Addendum)
Subjective:   [name] [name] is a 76 y.o. female who presents for Medicare Annual (Subsequent) preventive examination.  Review of Systems:  No ROS.  Medicare Wellness Visit.  Cardiac Risk Factors include: advanced age (>43mn, >>60women);dyslipidemia;hypertension Sleep patterns: has difficulty falling asleep, on waking, gets up 2-3 times nightly to void and sleeps 3-4 hours nightly.   Discussed sleep tips and encouraged patient to limit caffeine and to cut off electronics 1 hour before going to bed.  Home Safety/Smoke Alarms:  Feels safe in home. Smoke alarms in place.   Living environment; residence and Firearm Safety: 2-story house, no firearms.Lives alone Seat Belt Safety/Bike Helmet: Wears seat belt.   Counseling:   Eye Exam- goes yearly Dental- goes yearly  Female:   Pap- N/A      Mammo-  Last 05/23/16, BI-RADS category 1: negative     Dexa scan- Referral placed today      CCS- Patient states she has a cologuard kit at home and she plans to complete the screening.        Objective:     Vitals: BP 140/84   Pulse 63   Resp 20   Ht 5' 7"  (1.702 m)   Wt 225 lb (102.1 kg)   SpO2 99%   BMI 35.24 kg/m   Body mass index is 35.24 kg/m.   Tobacco History  Smoking Status  . Never Smoker  Smokeless Tobacco  . Never Used     Counseling given: Not Answered   Past Medical History:  Diagnosis Date  . ALLERGIC RHINITIS 11/06/2007  . 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 ANEMIA-NOS 06/17/2008  . ANXIETY 02/22/2008  . CARDIOMYOPATHY, PRIMARY, DILATED 12/30/2008  . [place]EASE, 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  . GERD 05/07/2007  . HYPERLIPIDEMIA 05/23/2007  . HYPERTENSION 05/07/2007  . 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 MENOPAUSAL DISORDER 05/06/2008  . 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 OVERACTIVE BLADDER 08/03/2010  . PYELONEPHRITIS, HX OF 05/07/2007  . SYSTOLIC HEART FAILURE, CHRONIC 12/28/2008  . VITAMIN D DEFICIENCY 06/17/2008   Past Surgical  History:  Procedure Laterality Date  . ABDOMINAL HYSTERECTOMY    . LAMINECTOMY     Family History  Problem Relation Age of Onset  . Arthritis Other   . Coronary artery disease Other   . Heart disease Mother    History  Sexual Activity  . Sexual activity: No    Outpatient Encounter Prescriptions as of 09/07/2016  Medication Sig  . albuterol (PROVENTIL HFA;VENTOLIN HFA) 108 (90 BASE) MCG/ACT inhaler Inhale 2 puffs into the lungs every 6 (six) hours as needed for wheezing or shortness of breath.  . ALPRAZolam (XANAX) 0.25 MG tablet Take 1 tablet (0.25 mg total) by mouth 2 (two) times daily as needed for anxiety.  . butalbital-acetaminophen-caffeine (FIORICET, ESGIC) 50-325-40 MG tablet TAKE 1 TABLET BY MOUTH TWICE A DAY AS NEEDED FOR HEADACHE  . carvedilol (COREG) 12.5 MG tablet TAKE 1 TABLET BY MOUTH TWICE A DAY  . Cholecalciferol (VITAMIN D3) 1000 UNITS CAPS Take 1 capsule by mouth daily.   . diclofenac (VOLTAREN) 75 MG EC tablet Take 1 tablet (75 mg total) by mouth 2 (two) times daily.  .Marland KitchenENTRESTO 24-26 MG TAKE 1 TABLET BY MOUTH 2 (TWO) TIMES DAILY.  .Marland Kitchenesomeprazole (NEXIUM) 40 MG capsule Take 40 mg by mouth daily at 12 noon.  .Marland Kitchenestradiol (ESTRACE) 1 MG tablet TAKE 1 TABLET BY MOUTH EVERY DAY  . furosemide (LASIX) 40 MG tablet Take 1.5 tablets (  60 mg total) by mouth 2 (two) times daily.  . promethazine (PHENERGAN) 25 MG tablet Take 1 tablet (25 mg total) by mouth every 6 (six) hours as needed for nausea or vomiting.  [name] spironolactone (ALDACTONE) 25 MG tablet TAKE 1 TABLET (25 MG TOTAL) BY MOUTH DAILY.  [name] tiZANidine (ZANAFLEX) 4 MG tablet TAKE 1 TABLET BY MOUTH  EVERY 6 HOURS AS NEEDED.  [name] triamcinolone (NASACORT AQ) 55 MCG/ACT AERO nasal inhaler Place 2 sprays into the nose daily.  . [DISCONTINUED] spironolactone (ALDACTONE) 25 MG tablet TAKE 1 TABLET BY MOUTH  DAILY. YEARLY PHYSICAL DUE  IN OCT  . [DISCONTINUED] spironolactone (ALDACTONE) 25 MG tablet TAKE 1 TABLET BY MOUTH  DAILY.    No facility-administered encounter medications on file as of 09/07/2016.     Activities of Daily Living In your present state of health, do you have any difficulty performing the following activities: 09/07/2016  Hearing? N  Vision? N  Difficulty concentrating or making decisions? N  Walking or climbing stairs? N  Dressing or bathing? N  Doing errands, shopping? N  Preparing Food and eating ? N  Using the Toilet? N  In the past six months, have you accidently leaked urine? Y  Do you have problems with loss of bowel control? N  Managing your Medications? N  Managing your Finances? N  Housekeeping or managing your Housekeeping? N  Some recent data might be hidden    Patient Care Team: Biagio Borg, MD as PCP - General Lelon Perla, MD as Consulting Physician (Cardiology) Deboraha Sprang, MD as Consulting Physician (Cardiology)    Assessment:    Physical assessment deferred to PCP.  Exercise Activities and Dietary recommendations Current Exercise Habits: Structured exercise class, Type of exercise: treadmill, Time (Minutes): 10, Frequency (Times/Week): 4, Weekly Exercise (Minutes/Week): 40, Intensity: Mild, Exercise limited by: cardiac condition(s)  Diet (meal preparation, eat out, water intake, caffeinated beverages, dairy products, fruits and vegetables): in general, an "unhealthy" diet patient is on fluid restriction 1500 cc per day.   Patient states that until recently she ate fast food 1-2 times daily. Dr. Stanford Breed explained that she could no longer eat unhealthy without having bad health consequences. Patient states she is now committed to eating a heart healthy diet. Health Coach provided education and hand-outs with diet information. Encouragement was given.    Goals    . Weight (lb) < 200 lb (90.7 kg)          Will exercise on treadmill and walk in my yard daily. Will maintain a low sodium, low fat diet.     . Weight < 180 lb (81.647 kg)          States she  felt better when she was thinner because her knees hurt when she is heavier Eats salads; Diet is good;   Will give information on a 1800 cal Diabetic diet to monitor for calorie       Fall Risk Fall Risk  09/07/2016 05/12/2016 05/08/2015 05/08/2015 05/01/2014  Falls in the past year? No No No No No   Depression Screen PHQ 2/9 Scores 09/07/2016 05/12/2016 05/08/2015 05/08/2015  PHQ - 2 Score 1 0 0 0     Cognitive Function MMSE - Mini Mental State Exam 05/08/2015  Not completed: (No Data)       Ad8 score reviewed for issues:  Issues making decisions:no  Less interest in hobbies / activities: no  Repeats questions, stories (family complaining): no  Trouble using ordinary  gadgets (microwave, computer, phone): no  Forgets the month or year: no  Mismanaging finances: no  Remembering appts: no  Daily problems with thinking and/or memory: no Ad8 score is= 0     Immunization History  Administered Date(s) Administered  . Pneumococcal Conjugate-13 02/07/2013  . Pneumococcal Polysaccharide-23 05/01/2014   Screening Tests Health Maintenance  Topic Date Due  . COLONOSCOPY  11/21/1992  . DEXA SCAN  11/22/2007  . INFLUENZA VACCINE  02/09/2016  . TETANUS/TDAP  07/11/2016  . MAMMOGRAM  05/23/2018  . PNA vac Low Risk Adult  Completed      Plan:     Continue to eat heart healthy diet (full of fruits, vegetables, whole grains, lean protein, water--limit salt, fat, and sugar intake) and increase physical activity as tolerated.  Continue doing brain stimulating activities (puzzles, reading, adult coloring books, staying active) to keep memory sharp.   During the course of the visit the patient was educated and counseled about the following appropriate screening and preventive services:   Vaccines to include Pneumoccal, Influenza, Hepatitis B, Td, Zostavax, HCV  Cardiovascular Disease  Colorectal cancer screening  Bone density screening  Diabetes screening  Glaucoma  screening  Mammography/PAP  Nutrition counseling   Patient Instructions (the written plan) was given to the patient.   Michiel Cowboy, RN  09/07/2016   Medical screening examination/treatment/procedure(s) were performed by non-physician practitioner and as supervising physician I was immediately available for consultation/collaboration. I agree with above. Cathlean Cower, MD

## 2016-09-06 NOTE — Progress Notes (Signed)
Pre visit review using our clinic review tool, if applicable. No additional management support is needed unless otherwise documented below in the visit note. 

## 2016-09-07 ENCOUNTER — Ambulatory Visit (INDEPENDENT_AMBULATORY_CARE_PROVIDER_SITE_OTHER): Payer: Medicare Other | Admitting: *Deleted

## 2016-09-07 VITALS — BP 140/84 | HR 63 | Resp 20 | Ht 67.0 in | Wt 225.0 lb

## 2016-09-07 DIAGNOSIS — E2839 Other primary ovarian failure: Secondary | ICD-10-CM | POA: Diagnosis not present

## 2016-09-07 DIAGNOSIS — Z0000 Encounter for general adult medical examination without abnormal findings: Secondary | ICD-10-CM | POA: Diagnosis not present

## 2016-09-07 NOTE — Patient Instructions (Addendum)
Continue to eat heart healthy diet (full of fruits, vegetables, whole grains, lean protein, water--limit salt, fat, and sugar intake) and increase physical activity as tolerated.  Continue doing brain stimulating activities (puzzles, reading, adult coloring books, staying active) to keep memory sharp.    [name] [name] , Thank you for taking time to come for your Medicare Wellness Visit. I appreciate your ongoing commitment to your health goals. Please review the following plan we discussed and let me know if I can assist you in the future.   These are the goals we discussed: Goals    . Weight (lb) < 200 lb (90.7 kg)          Will exercise on treadmill and walk in my yard daily. Will maintain a low sodium, low fat diet.     . Weight < 180 lb (81.647 kg)          States she felt better when she was thinner because her knees hurt when she is heavier Eats salads; Diet is good;   Will give information on a 1800 cal Diabetic diet to monitor for calorie        This is a list of the screening recommended for you and due dates:  Health Maintenance  Topic Date Due  . Colon Cancer Screening  11/21/1992  . DEXA scan (bone density measurement)  11/22/2007  . Flu Shot  02/09/2016  . Tetanus Vaccine  07/11/2016  . Mammogram  05/23/2018  . Pneumonia vaccines  Completed    DASH Eating Plan DASH stands for "Dietary Approaches to Stop Hypertension." The DASH eating plan is a healthy eating plan that has been shown to reduce high blood pressure (hypertension). It may also reduce your risk for type 2 diabetes, heart disease, and stroke. The DASH eating plan may also help with weight loss. What are tips for following this plan? General guidelines   Avoid eating more than 2,300 mg (milligrams) of salt (sodium) a day. If you have hypertension, you may need to reduce your sodium intake to 1,500 mg a day.  Limit alcohol intake to no more than 1 drink a day for nonpregnant women and 2 drinks a day for  men. One drink equals 12 oz of beer, 5 oz of Trevelle Mcgurn, or 1 oz of hard liquor.  Work with your health care provider to maintain a healthy body weight or to lose weight. Ask what an ideal weight is for you.  Get at least 30 minutes of exercise that causes your heart to beat faster (aerobic exercise) most days of the week. Activities may include walking, swimming, or biking.  Work with your health care provider or diet and nutrition specialist (dietitian) to adjust your eating plan to your individual calorie needs. Reading food labels   Check food labels for the amount of sodium per serving. Choose foods with less than 5 percent of the Daily Value of sodium. Generally, foods with less than 300 mg of sodium per serving fit into this eating plan.  To find whole grains, look for the word "whole" as the first word in the ingredient list. Shopping   Buy products labeled as "low-sodium" or "no salt added."  Buy fresh foods. Avoid canned foods and premade or frozen meals. Cooking   Avoid adding salt when cooking. Use salt-free seasonings or herbs instead of table salt or sea salt. Check with your health care provider or pharmacist before using salt substitutes.  Do not fry foods. Cook foods using healthy methods  such as baking, boiling, grilling, and broiling instead.  Cook with heart-healthy oils, such as olive, canola, soybean, or sunflower oil. Meal planning    Eat a balanced diet that includes:  5 or more servings of fruits and vegetables each day. At each meal, try to fill half of your plate with fruits and vegetables.  Up to 6-8 servings of whole grains each day.  Less than 6 oz of lean meat, poultry, or fish each day. A 3-oz serving of meat is about the same size as a deck of cards. One egg equals 1 oz.  2 servings of low-fat dairy each day.  A serving of nuts, seeds, or beans 5 times each week.  Heart-healthy fats. Healthy fats called Omega-3 fatty acids are found in foods such as  flaxseeds and coldwater fish, like sardines, salmon, and mackerel.  Limit how much you eat of the following:  Canned or prepackaged foods.  Food that is high in trans fat, such as fried foods.  Food that is high in saturated fat, such as fatty meat.  Sweets, desserts, sugary drinks, and other foods with added sugar.  Full-fat dairy products.  Do not salt foods before eating.  Try to eat at least 2 vegetarian meals each week.  Eat more home-cooked food and less restaurant, buffet, and fast food.  When eating at a restaurant, ask that your food be prepared with less salt or no salt, if possible. What foods are recommended? The items listed may not be a complete list. Talk with your dietitian about what dietary choices are best for you. Grains  Whole-grain or whole-wheat bread. Whole-grain or whole-wheat pasta. Brown rice. Modena Morrow. Bulgur. Whole-grain and low-sodium cereals. Pita bread. Low-fat, low-sodium crackers. Whole-wheat flour tortillas. Vegetables  Fresh or frozen vegetables (raw, steamed, roasted, or grilled). Low-sodium or reduced-sodium tomato and vegetable juice. Low-sodium or reduced-sodium tomato sauce and tomato paste. Low-sodium or reduced-sodium canned vegetables. Fruits  All fresh, dried, or frozen fruit. Canned fruit in natural juice (without added sugar). Meat and other protein foods  Skinless chicken or [place]. Ground chicken or [place]. Pork with fat trimmed off. Fish and seafood. Egg whites. Dried beans, peas, or lentils. Unsalted nuts, nut butters, and seeds. Unsalted canned beans. Lean cuts of beef with fat trimmed off. Low-sodium, lean deli meat. Dairy  Low-fat (1%) or fat-free (skim) milk. Fat-free, low-fat, or reduced-fat cheeses. Nonfat, low-sodium ricotta or cottage cheese. Low-fat or nonfat yogurt. Low-fat, low-sodium cheese. Fats and oils  Soft margarine without trans fats. Vegetable oil. Low-fat, reduced-fat, or light mayonnaise and salad  dressings (reduced-sodium). Canola, safflower, olive, soybean, and sunflower oils. Avocado. Seasoning and other foods  Herbs. Spices. Seasoning mixes without salt. Unsalted popcorn and pretzels. Fat-free sweets. What foods are not recommended? The items listed may not be a complete list. Talk with your dietitian about what dietary choices are best for you. Grains  Baked goods made with fat, such as croissants, muffins, or some breads. Dry pasta or rice meal packs. Vegetables  Creamed or fried vegetables. Vegetables in a cheese sauce. Regular canned vegetables (not low-sodium or reduced-sodium). Regular canned tomato sauce and paste (not low-sodium or reduced-sodium). Regular tomato and vegetable juice (not low-sodium or reduced-sodium). Angie Fava. Olives. Fruits  Canned fruit in a light or heavy syrup. Fried fruit. Fruit in cream or butter sauce. Meat and other protein foods  Fatty cuts of meat. Ribs. Fried meat. Berniece Salines. Sausage. Bologna and other processed lunch meats. Salami. Fatback. Hotdogs. Bratwurst. Salted nuts and  seeds. Canned beans with added salt. Canned or smoked fish. Whole eggs or egg yolks. Chicken or [place] with skin. Dairy  Whole or 2% milk, cream, and half-and-half. Whole or full-fat cream cheese. Whole-fat or sweetened yogurt. Full-fat cheese. Nondairy creamers. Whipped toppings. Processed cheese and cheese spreads. Fats and oils  Butter. Stick margarine. Lard. Shortening. Ghee. Bacon fat. Tropical oils, such as coconut, palm kernel, or palm oil. Seasoning and other foods  Salted popcorn and pretzels. Onion salt, garlic salt, seasoned salt, table salt, and sea salt. Worcestershire sauce. Tartar sauce. Barbecue sauce. Teriyaki sauce. Soy sauce, including reduced-sodium. Steak sauce. Canned and packaged gravies. Fish sauce. Oyster sauce. Cocktail sauce. Horseradish that you find on the shelf. Ketchup. Mustard. Meat flavorings and tenderizers. Bouillon cubes. Hot sauce and Tabasco  sauce. Premade or packaged marinades. Premade or packaged taco seasonings. Relishes. Regular salad dressings. Where to find more information:  National Heart, Lung, and [place]: https://wilson-eaton.com/  American Heart Association: www.heart.org Summary  The DASH eating plan is a healthy eating plan that has been shown to reduce high blood pressure (hypertension). It may also reduce your risk for type 2 diabetes, heart disease, and stroke.  With the DASH eating plan, you should limit salt (sodium) intake to 2,300 mg a day. If you have hypertension, you may need to reduce your sodium intake to 1,500 mg a day.  When on the DASH eating plan, aim to eat more fresh fruits and vegetables, whole grains, lean proteins, low-fat dairy, and heart-healthy fats.  Work with your health care provider or diet and nutrition specialist (dietitian) to adjust your eating plan to your individual calorie needs. This information is not intended to replace advice given to you by your health care provider. Make sure you discuss any questions you have with your health care provider. Document Released: 06/16/2011 Document Revised: 06/20/2016 Document Reviewed: 06/20/2016 Elsevier Interactive Patient Education  2017 Else[employer] Inc[employer] Fat and Cholesterol Restricted Diet High levels of fat and cholesterol in your blood may lead to various health problems, such as diseases of the heart, blood vessels, gallbladder, liver, and pancreas. Fats are concentrated sources of energy that come in various forms. Certain types of fat, including saturated fat, may be harmful in excess. Cholesterol is a substance needed by your body in small amounts. Your body makes all the cholesterol it needs. Excess cholesterol comes from the food you eat. When you have high levels of cholesterol and saturated fat in your blood, health problems can develop because the excess fat and cholesterol will gather along the walls of your blood vessels, causing  them to narrow. Choosing the right foods will help you control your intake of fat and cholesterol. This will help keep the levels of these substances in your blood within normal limits and reduce your risk of disease. What is my plan? Your health care provider recommends that you:  Limit your fat intake to ______% or less of your total calories per day.  Limit the amount of cholesterol in your diet to less than _________mg per day.  Eat 20-30 grams of fiber each day. What types of fat should I choose?  Choose healthy fats more often. Choose monounsaturated and polyunsaturated fats, such as olive and canola oil, flaxseeds, walnuts, almonds, and seeds.  Eat more omega-3 fats. Good choices include salmon, mackerel, sardines, tuna, flaxseed oil, and ground flaxseeds. Aim to eat fish at least two times a week.  Limit saturated fats. Saturated fats are primarily found in animal products,  such as meats, butter, and cream. Plant sources of saturated fats include palm oil, palm kernel oil, and coconut oil.  Avoid foods with partially hydrogenated oils in them. These contain trans fats. Examples of foods that contain trans fats are stick margarine, some tub margarines, cookies, crackers, and other baked goods. What general guidelines do I need to follow? These guidelines for healthy eating will help you control your intake of fat and cholesterol:  Check food labels carefully to identify foods with trans fats or high amounts of saturated fat.  Fill one half of your plate with vegetables and green salads.  Fill one fourth of your plate with whole grains. Look for the word "whole" as the first word in the ingredient list.  Fill one fourth of your plate with lean protein foods.  Limit fruit to two servings a day. Choose fruit instead of juice.  Eat more foods that contain fiber, such as apples, broccoli, carrots, beans, peas, and barley.  Eat more home-cooked food and less restaurant, buffet, and  fast food.  Limit or avoid alcohol.  Limit foods high in starch and sugar.  Limit fried foods.  Cook foods using methods other than frying. Baking, boiling, grilling, and broiling are all great options.  Lose weight if you are overweight. Losing just 5-10% of your initial body weight can help your overall health and prevent diseases such as diabetes and heart disease. What foods can I eat? Grains   Whole grains, such as whole wheat or whole grain breads, crackers, cereals, and pasta. Unsweetened oatmeal, bulgur, barley, quinoa, or brown rice. Corn or whole wheat flour tortillas. Vegetables   Fresh or frozen vegetables (raw, steamed, roasted, or grilled). Green salads. Fruits   All fresh, canned (in natural juice), or frozen fruits. Meats and other protein foods   Ground beef (85% or leaner), grass-fed beef, or beef trimmed of fat. Skinless chicken or [place]. Ground chicken or [place]. Pork trimmed of fat. All fish and seafood. Eggs. Dried beans, peas, or lentils. Unsalted nuts or seeds. Unsalted canned or dry beans. Dairy   Low-fat dairy products, such as skim or 1% milk, 2% or reduced-fat cheeses, low-fat ricotta or cottage cheese, or plain low-fat yo Fats and oils   Tub margarines without trans fats. Light or reduced-fat mayonnaise and salad dressings. Avocado. Olive, canola, sesame, or safflower oils. Natural peanut or almond butter (choose ones without added sugar and oil). The items listed above may not be a complete list of recommended foods or beverages. Contact your dietitian for more options.  Foods to avoid Grains   White bread. White pasta. White rice. Cornbread. Bagels, pastries, and croissants. Crackers that contain trans fat. Vegetables   White potatoes. Corn. Creamed or fried vegetables. Vegetables in a cheese sauce. Fruits   Dried fruits. Canned fruit in light or heavy syrup. Fruit juice. Meats and other protein foods   Fatty cuts of meat. Ribs, chicken  wings, bacon, sausage, bologna, salami, chitterlings, fatback, hot dogs, bratwurst, and packaged luncheon meats. Liver and organ meats. Dairy   Whole or 2% milk, cream, half-and-half, and cream cheese. Whole milk cheeses. Whole-fat or sweetened yogurt. Full-fat cheeses. Nondairy creamers and whipped toppings. Processed cheese, cheese spreads, or cheese curds. Beverages   Alcohol. Sweetened drinks (such as sodas, lemonade, and fruit drinks or punches). Fats and oils   Butter, stick margarine, lard, shortening, ghee, or bacon fat. Coconut, palm kernel, or palm oils. Sweets and desserts   Corn syrup, sugars, honey, and molasses.  Candy. Jam and jelly. Syrup. Sweetened cereals. Cookies, pies, cakes, donuts, muffins, and ice cream. The items listed above may not be a complete list of foods and beverages to avoid. Contact your dietitian for more information.  This information is not intended to replace advice given to you by your health care provider. Make sure you discuss any questions you have with your health care provider. Document Released: 06/27/2005 Document Revised: 07/18/2014 Document Reviewed: 09/25/2013 Elsevier Interactive Patient Education  2017 [place] and Cholesterol Restricted Diet Getting too much fat and cholesterol in your diet may cause health problems. Following this diet helps keep your fat and cholesterol at normal levels. This can keep you from getting sick. What types of fat should I choose?  Choose monosaturated and polyunsaturated fats. These are found in foods such as olive oil, canola oil, flaxseeds, walnuts, almonds, and seeds.  Eat more omega-3 fats. Good choices include salmon, mackerel, sardines, tuna, flaxseed oil, and ground flaxseeds.  Limit saturated fats. These are in animal products such as meats, butter, and cream. They can also be in plant products such as palm oil, palm kernel oil, and coconut oil.  Avoid foods with partially hydrogenated  oils in them. These contain trans fats. Examples of foods that have trans fats are stick margarine, some tub margarines, cookies, crackers, and other baked goods. What general guidelines do I need to follow?  Check food labels. Look for the words "trans fat" and "saturated fat."  When preparing a meal:  Fill half of your plate with vegetables and green salads.  Fill one fourth of your plate with whole grains. Look for the word "whole" as the first word in the ingredient list.  Fill one fourth of your plate with lean protein foods.  Eat more foods that have fiber, like apples, carrots, beans, peas, and barley.  Eat more home-cooked foods. Eat less at restaurants and buffets.  Limit or avoid alcohol.  Limit foods high in starch and sugar.  Limit fried foods.  Cook foods without frying them. Baking, boiling, grilling, and broiling are all great options.  Lose weight if you are overweight. Losing even a small amount of weight can help your overall health. It can also help prevent diseases such as diabetes and heart disease. What foods can I eat? Grains  Whole grains, such as whole wheat or whole grain breads, crackers, cereals, and pasta. Unsweetened oatmeal, bulgur, barley, quinoa, or brown rice. Corn or whole wheat flour tortillas. Vegetables  Fresh or frozen vegetables (raw, steamed, roasted, or grilled). Green salads. Fruits  All fresh, canned (in natural juice), or frozen fruits. Meat and Other Protein Products  Ground beef (85% or leaner), grass-fed beef, or beef trimmed of fat. Skinless chicken or [place]. Ground chicken or [place]. Pork trimmed of fat. All fish and seafood. Eggs. Dried beans, peas, or lentils. Unsalted nuts or seeds. Unsalted canned or dry beans. Dairy  Low-fat dairy products, such as skim or 1% milk, 2% or reduced-fat cheeses, low-fat ricotta or cottage cheese, or plain low-fat yogurt. Fats and Oils  Tub margarines without trans fats. Light or reduced-fat  mayonnaise and salad dressings. Avocado. Olive, canola, sesame, or safflower oils. Natural peanut or almond butter (choose ones without added sugar and oil). The items listed above may not be a complete list of recommended foods or beverages. Contact your dietitian for more options.  What foods are not recommended? Grains  White bread. White pasta. White rice. Cornbread. Bagels, pastries, and  croissants. Crackers that contain trans fat. Vegetables  White potatoes. Corn. Creamed or fried vegetables. Vegetables in a cheese sauce. Fruits  Dried fruits. Canned fruit in light or heavy syrup. Fruit juice. Meat and Other Protein Products  Fatty cuts of meat. Ribs, chicken wings, bacon, sausage, bologna, salami, chitterlings, fatback, hot dogs, bratwurst, and packaged luncheon meats. Liver and organ meats. Dairy  Whole or 2% milk, cream, half-and-half, and cream cheese. Whole milk cheeses. Whole-fat or sweetened yogurt. Full-fat cheeses. Nondairy creamers and whipped toppings. Processed cheese, cheese spreads, or cheese curds. Sweets and Desserts  Corn syrup, sugars, honey, and molasses. Candy. Jam and jelly. Syrup. Sweetened cereals. Cookies, pies, cakes, donuts, muffins, and ice cream. Fats and Oils  Butter, stick margarine, lard, shortening, ghee, or bacon fat. Coconut, palm kernel, or palm oils. Beverages  Alcohol. Sweetened drinks (such as sodas, lemonade, and fruit drinks or punches). The items listed above may not be a complete list of foods and beverages to avoid. Contact your dietitian for more information.  This information is not intended to replace advice given to you by your health care provider. Make sure you discuss any questions you have with your health care provider. Document Released: 12/27/2011 Document Revised: 03/03/2016 Document Reviewed: 09/26/2013 Elsevier Interactive Patient Education  2017 [employer].

## 2016-09-13 ENCOUNTER — Ambulatory Visit (INDEPENDENT_AMBULATORY_CARE_PROVIDER_SITE_OTHER): Payer: Medicare Other | Admitting: *Deleted

## 2016-09-13 DIAGNOSIS — I428 Other cardiomyopathies: Secondary | ICD-10-CM | POA: Diagnosis not present

## 2016-09-13 NOTE — Progress Notes (Signed)
Remote ICD transmission.   

## 2016-09-14 ENCOUNTER — Encounter: Payer: Self-pay | Admitting: Cardiology

## 2016-09-14 LAB — CUP PACEART REMOTE DEVICE CHECK
Battery Voltage: 2.64 V
Brady Statistic RV Percent Paced: 0 %
Date Time Interrogation Session: 20180306163400
HIGH POWER IMPEDANCE MEASURED VALUE: 39 Ohm
Implantable Lead Implant Date: 20081215
Implantable Lead Location: 753860
Implantable Pulse Generator Implant Date: 20081215
Lead Channel Sensing Intrinsic Amplitude: 13.9 mV
Lead Channel Setting Pacing Pulse Width: 0.4 ms
Lead Channel Setting Sensing Sensitivity: 0.3 mV
MDC IDC MSMT LEADCHNL RV IMPEDANCE VALUE: 464 Ohm
MDC IDC SET LEADCHNL RV PACING AMPLITUDE: 2.5 V

## 2016-09-15 ENCOUNTER — Telehealth (HOSPITAL_COMMUNITY): Payer: Self-pay

## 2016-09-15 NOTE — Telephone Encounter (Signed)
Encounter complete. 

## 2016-09-16 ENCOUNTER — Telehealth (HOSPITAL_COMMUNITY): Payer: Self-pay

## 2016-09-16 ENCOUNTER — Encounter: Payer: Self-pay | Admitting: Internal Medicine

## 2016-09-16 NOTE — Telephone Encounter (Signed)
Encounter complete. 

## 2016-09-20 ENCOUNTER — Inpatient Hospital Stay (HOSPITAL_COMMUNITY): Admission: RE | Admit: 2016-09-20 | Payer: Medicare Other | Source: Ambulatory Visit

## 2016-09-21 ENCOUNTER — Ambulatory Visit (HOSPITAL_COMMUNITY): Payer: Medicare Other

## 2016-09-23 DIAGNOSIS — I5022 Chronic systolic (congestive) heart failure: Secondary | ICD-10-CM | POA: Diagnosis not present

## 2016-09-24 LAB — BASIC METABOLIC PANEL
BUN/Creatinine Ratio: 18 (ref 12–28)
BUN: 22 mg/dL (ref 8–27)
CALCIUM: 9.5 mg/dL (ref 8.7–10.3)
CO2: 23 mmol/L (ref 18–29)
Chloride: 100 mmol/L (ref 96–106)
Creatinine, Ser: 1.25 mg/dL — ABNORMAL HIGH (ref 0.57–1.00)
GFR calc Af Amer: 49 mL/min/{1.73_m2} — ABNORMAL LOW (ref >59)
GFR calc non Af Amer: 43 mL/min/{1.73_m2} — ABNORMAL LOW (ref >59)
Glucose: 102 mg/dL — ABNORMAL HIGH (ref 65–99)
Potassium: 5 mmol/L (ref 3.5–5.2)
SODIUM: 141 mmol/L (ref 134–144)

## 2016-09-24 LAB — BRAIN NATRIURETIC PEPTIDE: BNP: 53.3 pg/mL (ref 0.0–100.0)

## 2016-09-30 ENCOUNTER — Telehealth (HOSPITAL_COMMUNITY): Payer: Self-pay

## 2016-09-30 ENCOUNTER — Other Ambulatory Visit: Payer: Self-pay | Admitting: Internal Medicine

## 2016-09-30 NOTE — Telephone Encounter (Signed)
Encounter complete. 

## 2016-09-30 NOTE — Telephone Encounter (Signed)
Per Shirron she faxed back to [facility]lmb

## 2016-10-05 ENCOUNTER — Ambulatory Visit (HOSPITAL_COMMUNITY): Payer: Medicare Other

## 2016-10-06 ENCOUNTER — Ambulatory Visit (HOSPITAL_COMMUNITY): Payer: Medicare Other

## 2016-10-14 ENCOUNTER — Other Ambulatory Visit: Payer: Self-pay | Admitting: Internal Medicine

## 2016-10-14 DIAGNOSIS — I5022 Chronic systolic (congestive) heart failure: Secondary | ICD-10-CM

## 2016-10-18 ENCOUNTER — Other Ambulatory Visit: Payer: Self-pay

## 2016-10-18 ENCOUNTER — Telehealth (HOSPITAL_COMMUNITY): Payer: Self-pay

## 2016-10-18 MED ORDER — ESTRADIOL 1 MG PO TABS: 1.0000 mg | ORAL_TABLET | Freq: Every day | ORAL | 1 refills | Status: DC

## 2016-10-19 NOTE — Telephone Encounter (Signed)
Encounter complete. 

## 2016-10-20 ENCOUNTER — Ambulatory Visit (HOSPITAL_COMMUNITY): Payer: Medicare Other

## 2016-10-21 ENCOUNTER — Telehealth (HOSPITAL_COMMUNITY): Payer: Self-pay

## 2016-10-21 ENCOUNTER — Inpatient Hospital Stay (HOSPITAL_COMMUNITY): Admission: RE | Admit: 2016-10-21 | Payer: Medicare Other | Source: Ambulatory Visit

## 2016-10-21 NOTE — Telephone Encounter (Signed)
Encounter complete. 

## 2016-10-26 ENCOUNTER — Ambulatory Visit (HOSPITAL_COMMUNITY)
Admission: RE | Admit: 2016-10-26 | Discharge: 2016-10-26 | Disposition: A | Discharge status: Home / Self Care | Payer: Medicare Other | Source: Ambulatory Visit | Attending: Cardiovascular Disease | Admitting: Cardiovascular Disease

## 2016-10-26 DIAGNOSIS — R42 Dizziness and giddiness: Secondary | ICD-10-CM | POA: Insufficient documentation

## 2016-10-26 DIAGNOSIS — Z95810 Presence of automatic (implantable) cardiac defibrillator: Secondary | ICD-10-CM | POA: Insufficient documentation

## 2016-10-26 DIAGNOSIS — J45909 Unspecified asthma, uncomplicated: Secondary | ICD-10-CM | POA: Insufficient documentation

## 2016-10-26 DIAGNOSIS — I110 Hypertensive heart disease with heart failure: Secondary | ICD-10-CM | POA: Insufficient documentation

## 2016-10-26 DIAGNOSIS — I5022 Chronic systolic (congestive) heart failure: Secondary | ICD-10-CM | POA: Insufficient documentation

## 2016-10-26 DIAGNOSIS — I493 Ventricular premature depolarization: Secondary | ICD-10-CM | POA: Insufficient documentation

## 2016-10-26 DIAGNOSIS — R0609 Other forms of dyspnea: Secondary | ICD-10-CM | POA: Insufficient documentation

## 2016-10-26 DIAGNOSIS — R5383 Other fatigue: Secondary | ICD-10-CM | POA: Insufficient documentation

## 2016-10-26 DIAGNOSIS — Z6835 Body mass index (BMI) 35.0-35.9, adult: Secondary | ICD-10-CM | POA: Diagnosis not present

## 2016-10-26 DIAGNOSIS — I428 Other cardiomyopathies: Secondary | ICD-10-CM | POA: Insufficient documentation

## 2016-10-26 DIAGNOSIS — Z8249 Family history of ischemic heart disease and other diseases of the circulatory system: Secondary | ICD-10-CM | POA: Insufficient documentation

## 2016-10-26 DIAGNOSIS — R072 Precordial pain: Secondary | ICD-10-CM

## 2016-10-26 DIAGNOSIS — E669 Obesity, unspecified: Secondary | ICD-10-CM | POA: Insufficient documentation

## 2016-10-26 MED ORDER — AMINOPHYLLINE 25 MG/ML IV SOLN
75.0000 mg | Freq: Once | INTRAVENOUS | Status: AC
  Administered 2016-10-26: 75 mg via INTRAVENOUS

## 2016-10-26 MED ORDER — REGADENOSON 0.4 MG/5ML IV SOLN
0.4000 mg | Freq: Once | INTRAVENOUS | Status: AC
  Administered 2016-10-26: 0.4 mg via INTRAVENOUS

## 2016-10-26 MED ORDER — TECHNETIUM TC 99M TETROFOSMIN IV KIT
32.7000 | PACK | Freq: Once | INTRAVENOUS | Status: AC | PRN
  Administered 2016-10-26: 32.7 via INTRAVENOUS
  Filled 2016-10-26: qty 33

## 2016-10-27 ENCOUNTER — Ambulatory Visit (HOSPITAL_COMMUNITY)
Admission: RE | Admit: 2016-10-27 | Discharge: 2016-10-27 | Disposition: A | Discharge status: Home / Self Care | Payer: Medicare Other | Source: Ambulatory Visit | Attending: Cardiology | Admitting: Cardiology

## 2016-10-27 LAB — MYOCARDIAL PERFUSION IMAGING
CHL CUP NUCLEAR SRS: 4
CHL CUP RESTING HR STRESS: 61 {beats}/min
CSEPPHR: 94 {beats}/min
LV dias vol: 167 mL (ref 46–106)
LVSYSVOL: 108 mL
SDS: 4
SSS: 8
TID: 0.96

## 2016-10-27 MED ORDER — TECHNETIUM TC 99M TETROFOSMIN IV KIT
30.2000 | PACK | Freq: Once | INTRAVENOUS | Status: AC | PRN
  Administered 2016-10-27: 30.2 via INTRAVENOUS

## 2016-11-03 NOTE — Progress Notes (Deleted)
HPI: FU nonischemic cardiomyopathy. Cardiac catheterization in 2004 showed normal coronary arteries. 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 Last echocardiogram in March of 2015 showed an ejection fraction of 15-20% and mild to moderate mitral regurgitation. 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 Patient was referred to Dr. Lovena Le by Dr. Caryl Comes for consideration of ablation but Dr. Lovena Le felt medical therapy indicated. Nuclear study April 2018 showed ejection fraction 35% with no ischemia. Since I last saw her,   Current Outpatient Prescriptions  Medication Sig Dispense Refill  . albuterol (PROVENTIL HFA;VENTOLIN HFA) 108 (90 BASE) MCG/ACT inhaler Inhale 2 puffs into the lungs every 6 (six) hours as needed for wheezing or shortness of breath. 3 Inhaler 3  . ALPRAZolam (XANAX) 0.25 MG tablet Take 1 tablet (0.25 mg total) by mouth 2 (two) times daily as needed for anxiety. 60 tablet 2  . butalbital-acetaminophen-caffeine (FIORICET, ESGIC) 50-325-40 MG tablet TAKE 1 TABLET BY MOUTH TWICE A DAY AS NEEDED FOR HEADACHE 30 tablet 0  . carvedilol (COREG) 12.5 MG tablet TAKE 1 TABLET BY MOUTH TWICE A DAY 180 tablet 1  . Cholecalciferol (VITAMIN D3) 1000 UNITS CAPS Take 1 capsule by mouth daily.     . diclofenac (VOLTAREN) 75 MG EC tablet Take 1 tablet (75 mg total) by mouth 2 (two) times daily. 180 tablet 1  . ENTRESTO 24-26 MG TAKE 1 TABLET BY MOUTH 2 (TWO) TIMES DAILY. 60 tablet 11  . esomeprazole (NEXIUM) 40 MG capsule Take 40 mg by mouth daily at 12 noon.    [name] estradiol (ESTRACE) 1 MG tablet Take 1 tablet (1 mg total) by mouth daily. 90 tablet 1  . furosemide (LASIX) 40 MG tablet Take 1.5 tablets (60 mg total) by mouth 2 (two) times daily. 270 tablet 3  . promethazine (PHENERGAN) 25 MG tablet Take 1 tablet (25 mg total) by mouth every 6 (six) hours as needed for nausea or vomiting. 40 tablet 1  . spironolactone (ALDACTONE) 25 MG tablet TAKE 1 TABLET BY MOUTH   DAILY. 90 tablet 2  . tiZANidine (ZANAFLEX) 4 MG tablet TAKE 1 TABLET BY MOUTH  EVERY 6 HOURS AS NEEDED. 180 tablet 1  . triamcinolone (NASACORT AQ) 55 MCG/ACT AERO nasal inhaler Place 2 sprays into the nose daily. 1 Inhaler 12   No current facility-administered medications for this visit.      Past Medical History:  Diagnosis Date  . ALLERGIC RHINITIS 11/06/2007  . 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 ANEMIA-NOS 06/17/2008  . ANXIETY 02/22/2008  . CARDIOMYOPATHY, PRIMARY, DILATED 12/30/2008  . 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 GERD 05/07/2007  . HYPERLIPIDEMIA 05/23/2007  . HYPERTENSION 05/07/2007  . 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 MENOPAUSAL DISORDER 05/06/2008  . 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 OVERACTIVE BLADDER 08/03/2010  . PYELONEPHRITIS, HX OF 05/07/2007  . SYSTOLIC HEART FAILURE, CHRONIC 12/28/2008  . VITAMIN D DEFICIENCY 06/17/2008    Past Surgical History:  Procedure Laterality Date  . ABDOMINAL HYSTERECTOMY    . LAMINECTOMY      Social History   Social History  . Marital status: Widowed    Spouse name: N/A  . Number of children: 3  . Years of education: N/A   Occupational History  . 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 Smoking status: Never Smoker  . Smokeless tobacco: Never Used  . Alcohol use No  . Drug use: No  .  Sexual activity: No   Other Topics Concern  . Not on file   Social History Narrative  . No narrative on file    Family History  Problem Relation Age of Onset  . Arthritis Other   . Coronary artery disease Other   . Heart disease Mother     ROS: no fevers or chills, productive cough, hemoptysis, dysphasia, odynophagia, melena, hematochezia, dysuria, hematuria, rash, seizure activity, orthopnea, PND, pedal edema, claudication. Remaining systems are negative.  Physical Exam: Well-developed well-nourished in no acute distress.  Skin is warm and dry.  HEENT is normal.    Neck is supple.  Chest is clear to auscultation with normal expansion.  Cardiovascular exam is regular rate and rhythm.  Abdominal exam nontender or distended. No masses palpated. Extremities show no edema. neuro grossly intact  ECG- personally reviewed  A/P  1  Kirk Ruths, MD

## 2016-11-10 ENCOUNTER — Ambulatory Visit: Payer: Medicare Other | Admitting: Cardiology

## 2016-11-22 ENCOUNTER — Ambulatory Visit: Payer: Medicare Other | Admitting: Student

## 2016-12-20 ENCOUNTER — Other Ambulatory Visit: Payer: Self-pay | Admitting: Cardiology

## 2016-12-29 ENCOUNTER — Encounter: Payer: Self-pay | Admitting: Physician Assistant

## 2017-01-08 NOTE — Progress Notes (Deleted)
Cardiology Office Note Date:  01/08/2017  Patient ID:  [name] [name], [name] [name] 31-Oct-1940, MRN [id number] PCP:  Biagio Borg, MD  Cardiologist:  Dr. Stanford Breed Electrophysiologist: Dr. Caryl Comes  ***refresh   Chief Complaint:   History of Present Illness: [name] [name] is a 76 y.o. female with history of NICM w/chonic CHF (systolic), PVC's evaluated by Dr. Lovena Le for possible ablation though recommended medical management w/BB, HTN, HLD.  She was last seen by cardiology, Dr. Stanford Breed in February, with c/o CP, planned for stress testing that was negative for evidence of ischemia.  She comes in today to be seen for Dr. Caryl Comes, last seen by him in June 2016, at this visit the pt c/o poor sleep and QHS benadry and evaluation with PMD were recommended.  *** PVC burden *** + remotes  Device information: MDT single chamber ICD implanted 2008, Dr. Caryl Comes   Past Medical History:  Diagnosis Date  . ALLERGIC RHINITIS 11/06/2007  . 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 ANEMIA-NOS 06/17/2008  . ANXIETY 02/22/2008  . CARDIOMYOPATHY, PRIMARY, DILATED 12/30/2008  . [place] DISEASE, 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  . GERD 05/07/2007  . HYPERLIPIDEMIA 05/23/2007  . HYPERTENSION 05/07/2007  . 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 MENOPAUSAL DISORDER 05/06/2008  . 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 OVERACTIVE BLADDER 08/03/2010  . PYELONEPHRITIS, HX OF 05/07/2007  . SYSTOLIC HEART FAILURE, CHRONIC 12/28/2008  . VITAMIN D DEFICIENCY 06/17/2008    Past Surgical History:  Procedure Laterality Date  . ABDOMINAL HYSTERECTOMY    . LAMINECTOMY      Current Outpatient Prescriptions  Medication Sig Dispense Refill  . albuterol (PROVENTIL HFA;VENTOLIN HFA) 108 (90 BASE) MCG/ACT inhaler Inhale 2 puffs into the lungs every 6 (six) hours as needed for wheezing or shortness of breath. 3 Inhaler 3  . ALPRAZolam (XANAX) 0.25 MG tablet Take 1 tablet (0.25 mg total) by mouth 2 (two) times daily as needed for  anxiety. 60 tablet 2  . butalbital-acetaminophen-caffeine (FIORICET, ESGIC) 50-325-40 MG tablet TAKE 1 TABLET BY MOUTH TWICE A DAY AS NEEDED FOR HEADACHE 30 tablet 0  . carvedilol (COREG) 12.5 MG tablet TAKE 1 TABLET BY MOUTH TWICE A DAY 180 tablet 3  . Cholecalciferol (VITAMIN D3) 1000 UNITS CAPS Take 1 capsule by mouth daily.     . diclofenac (VOLTAREN) 75 MG EC tablet Take 1 tablet (75 mg total) by mouth 2 (two) times daily. 180 tablet 1  . ENTRESTO 24-26 MG TAKE 1 TABLET BY MOUTH 2 (TWO) TIMES DAILY. 60 tablet 11  . esomeprazole (NEXIUM) 40 MG capsule Take 40 mg by mouth daily at 12 noon.    [name] estradiol (ESTRACE) 1 MG tablet Take 1 tablet (1 mg total) by mouth daily. 90 tablet 1  . furosemide (LASIX) 40 MG tablet Take 1.5 tablets (60 mg total) by mouth 2 (two) times daily. 270 tablet 3  . promethazine (PHENERGAN) 25 MG tablet Take 1 tablet (25 mg total) by mouth every 6 (six) hours as needed for nausea or vomiting. 40 tablet 1  . spironolactone (ALDACTONE) 25 MG tablet TAKE 1 TABLET BY MOUTH  DAILY. 90 tablet 2  . tiZANidine (ZANAFLEX) 4 MG tablet TAKE 1 TABLET BY MOUTH  EVERY 6 HOURS AS NEEDED. 180 tablet 1  . triamcinolone (NASACORT AQ) 55 MCG/ACT AERO nasal inhaler Place 2 sprays into the nose daily. 1 Inhaler 12   No current facility-administered medications for this visit.     Allergies:   Zocor [simvastatin - high  dose]   Social History:  The patient  reports that she has never smoked. She has never used smokeless tobacco. She reports that she does not drink alcohol or use drugs.   Family History:  The patient's family history includes Arthritis in her other; Coronary artery disease in her other; Heart disease in her mother.  ROS:  Please see the history of present illness.  All other systems are reviewed and otherwise negative.   PHYSICAL EXAM: *** VS:  There were no vitals taken for this visit. BMI: There is no height or weight on file to calculate BMI. Well nourished, well  developed, in no acute distress  HEENT: normocephalic, atraumatic  Neck: no JVD, carotid bruits or masses Cardiac:  *** RRR; no significant murmurs, no rubs, or gallops Lungs:  *** CTA b/l, no wheezing, rhonchi or rales  Abd: soft, nontender MS: no deformity or atrophy Ext: no edema  Skin: warm and dry, no rash Neuro:  No gross deficits appreciated Psych: euthymic mood, full affect  *** ICD site is stable, no tethering or discomfort   EKG:  09/05/16: SR, PVCs ICD interrogation done today by industry and reviewed by myself: ***   10/27/16: Stress myoview Study Highlights  The left ventricular ejection fraction is moderately decreased (30-44%).  Nuclear stress EF: 35%.  No change from baseline EKG showing NSR with LVH and repolarization abnormality.  The perfusion imaging is normal with no ischemia.  This is an intermediate risk study due to LV dysfunction.   09/27/13: TTE Study Conclusion - Left ventricle: The cavity size was moderately dilated. Wall thickness was normal. Systolic function was severely reduced. The estimated ejection fraction was in the range of 15% to 20%. Severe hypokinesis of the entire myocardium. - Mitral valve: Mild to moderate regurgitation.   Recent Labs: 05/12/2016: ALT 11; Hemoglobin 10.9; Platelets 216.0; TSH 0.60 09/23/2016: BNP 53.3; BUN 22; Creatinine, Ser 1.25; Potassium 5.0; Sodium 141  05/12/2016: Cholesterol 203; HDL 58.40; LDL Cholesterol 109; Total CHOL/HDL Ratio 3; Triglycerides 178.0; VLDL 35.6   CrCl cannot be calculated (Patient's most recent lab result is older than the maximum 21 days allowed.).   Wt Readings from Last 3 Encounters:  10/26/16 228 lb (103.4 kg)  09/07/16 225 lb (102.1 kg)  09/05/16 228 lb (103.4 kg)     Other studies reviewed: Additional studies/records reviewed today include: summarized above  ASSESSMENT AND PLAN:  1. NICM     ***  2. ICD     ***  3. PVC's     ***  4. HTN      ***   Disposition: F/u with ***  Current medicines are reviewed at length with the patient today.  The patient did not have any concerns regarding medicines.***  Signed, Jennings Books, PA-C 01/08/2017 3:17 PM     [facility] [address] [place] [address] [phone] (office)  [phone] (fax)

## 2017-01-09 ENCOUNTER — Encounter: Payer: Medicare Other | Admitting: Physician Assistant

## 2017-01-10 ENCOUNTER — Encounter: Payer: Self-pay | Admitting: *Deleted

## 2017-01-10 NOTE — Progress Notes (Deleted)
HPI: FU nonischemic cardiomyopathy. Most recent nuclear study in November 2008 showed an ejection fraction of 34%. There was mild apical thinning and a mild degree of ischemia cannot be excluded. Low risk. We have treated her medically. 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 Last echocardiogram in March of 2015 showed an ejection fraction of 15-20% and mild to moderate mitral regurgitation. 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 Patient was referred to Dr. Lovena Le by Dr. Caryl Comes for consideration of ablation but Dr. Lovena Le felt medical therapy indicated. Nuclear study 4/18 showed EF 35 with normal perfusion. Since I last saw her,   Current Outpatient Prescriptions  Medication Sig Dispense Refill  . albuterol (PROVENTIL HFA;VENTOLIN HFA) 108 (90 BASE) MCG/ACT inhaler Inhale 2 puffs into the lungs every 6 (six) hours as needed for wheezing or shortness of breath. 3 Inhaler 3  . ALPRAZolam (XANAX) 0.25 MG tablet Take 1 tablet (0.25 mg total) by mouth 2 (two) times daily as needed for anxiety. 60 tablet 2  . butalbital-acetaminophen-caffeine (FIORICET, ESGIC) 50-325-40 MG tablet TAKE 1 TABLET BY MOUTH TWICE A DAY AS NEEDED FOR HEADACHE 30 tablet 0  . carvedilol (COREG) 12.5 MG tablet TAKE 1 TABLET BY MOUTH TWICE A DAY 180 tablet 3  . Cholecalciferol (VITAMIN D3) 1000 UNITS CAPS Take 1 capsule by mouth daily.     . diclofenac (VOLTAREN) 75 MG EC tablet Take 1 tablet (75 mg total) by mouth 2 (two) times daily. 180 tablet 1  . ENTRESTO 24-26 MG TAKE 1 TABLET BY MOUTH 2 (TWO) TIMES DAILY. 60 tablet 11  . esomeprazole (NEXIUM) 40 MG capsule Take 40 mg by mouth daily at 12 noon.    [name] estradiol (ESTRACE) 1 MG tablet Take 1 tablet (1 mg total) by mouth daily. 90 tablet 1  . furosemide (LASIX) 40 MG tablet Take 1.5 tablets (60 mg total) by mouth 2 (two) times daily. 270 tablet 3  . promethazine (PHENERGAN) 25 MG tablet Take 1 tablet (25 mg total) by mouth every 6 (six)  hours as needed for nausea or vomiting. 40 tablet 1  . spironolactone (ALDACTONE) 25 MG tablet TAKE 1 TABLET BY MOUTH  DAILY. 90 tablet 2  . tiZANidine (ZANAFLEX) 4 MG tablet TAKE 1 TABLET BY MOUTH  EVERY 6 HOURS AS NEEDED. 180 tablet 1  . triamcinolone (NASACORT AQ) 55 MCG/ACT AERO nasal inhaler Place 2 sprays into the nose daily. 1 Inhaler 12   No current facility-administered medications for this visit.      Past Medical History:  Diagnosis Date  . ALLERGIC RHINITIS 11/06/2007  . 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 ANEMIA-NOS 06/17/2008  . ANXIETY 02/22/2008  . CARDIOMYOPATHY, PRIMARY, DILATED 12/30/2008  . [place] DISEASE, 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  . GERD 05/07/2007  . HYPERLIPIDEMIA 05/23/2007  . HYPERTENSION 05/07/2007  . 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 MENOPAUSAL DISORDER 05/06/2008  . 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 OVERACTIVE BLADDER 08/03/2010  . PYELONEPHRITIS, HX OF 05/07/2007  . SYSTOLIC HEART FAILURE, CHRONIC 12/28/2008  . VITAMIN D DEFICIENCY 06/17/2008    Past Surgical History:  Procedure Laterality Date  . ABDOMINAL HYSTERECTOMY    . LAMINECTOMY      Social History   Social History  . Marital status: Widowed    Spouse name: N/A  . Number of children: 3  . Years of education: N/A   Occupational History  . 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  Smoking status: Never Smoker  . Smokeless tobacco: Never Used  . Alcohol use No  . Drug use: No  . Sexual activity: No   Other Topics Concern  . Not on file   Social History Narrative  . No narrative on file    Family History  Problem Relation Age of Onset  . Arthritis Other   . Coronary artery disease Other   . Kidney disease Other   . Heart disease Mother     ROS: no fevers or chills, productive cough, hemoptysis, dysphasia, odynophagia, melena, hematochezia, dysuria, hematuria, rash, seizure activity, orthopnea, PND, pedal edema, claudication.  Remaining systems are negative.  Physical Exam: Well-developed well-nourished in no acute distress.  Skin is warm and dry.  HEENT is normal.  Neck is supple.  Chest is clear to auscultation with normal expansion.  Cardiovascular exam is regular rate and rhythm.  Abdominal exam nontender or distended. No masses palpated. Extremities show no edema. neuro grossly intact  ECG- personally reviewed  A/P  1  [name] Ruths, MD

## 2017-01-24 ENCOUNTER — Ambulatory Visit: Payer: Medicare Other | Admitting: Cardiology

## 2017-01-31 ENCOUNTER — Encounter: Payer: Medicare Other | Admitting: Physician Assistant

## 2017-02-22 ENCOUNTER — Ambulatory Visit (INDEPENDENT_AMBULATORY_CARE_PROVIDER_SITE_OTHER): Payer: Medicare Other | Admitting: Internal Medicine

## 2017-02-22 VITALS — BP 110/70 | HR 72 | Temp 97.9°F | Ht 67.0 in | Wt 224.0 lb

## 2017-02-22 DIAGNOSIS — M25511 Pain in right shoulder: Secondary | ICD-10-CM

## 2017-02-22 DIAGNOSIS — M19019 Primary osteoarthritis, unspecified shoulder: Secondary | ICD-10-CM

## 2017-02-22 DIAGNOSIS — I10 Essential (primary) hypertension: Secondary | ICD-10-CM

## 2017-02-22 MED ORDER — PROMETHAZINE HCL 25 MG PO TABS: 25.0000 mg | ORAL_TABLET | Freq: Four times a day (QID) | ORAL | 1 refills | Status: DC | PRN

## 2017-02-22 MED ORDER — PREDNISONE 10 MG PO TABS: ORAL_TABLET | ORAL | 0 refills | Status: DC

## 2017-02-22 MED ORDER — BUTALBITAL-APAP-CAFFEINE 50-325-40 MG PO TABS: ORAL_TABLET | ORAL | 0 refills | Status: DC

## 2017-02-22 MED ORDER — HYDROCODONE-ACETAMINOPHEN 5-325 MG PO TABS: 1.0000 | ORAL_TABLET | Freq: Four times a day (QID) | ORAL | 0 refills | Status: DC | PRN

## 2017-02-22 NOTE — Patient Instructions (Addendum)
Please take all new medication as prescribed - the prednisone, and the hydrocodone for pain  Please make an appt with Dr Raeford Razor at the desk as you leave today  Please continue all other medications as before, and refills have been done if requested - the butalbital med and phenergan  Please have the pharmacy call with any other refills you may need.  Please keep your appointments with your specialists as you may have planned

## 2017-02-23 ENCOUNTER — Other Ambulatory Visit: Payer: Self-pay | Admitting: Internal Medicine

## 2017-02-25 DIAGNOSIS — M19019 Primary osteoarthritis, unspecified shoulder: Secondary | ICD-10-CM | POA: Insufficient documentation

## 2017-02-25 DIAGNOSIS — M25511 Pain in right shoulder: Secondary | ICD-10-CM | POA: Insufficient documentation

## 2017-02-25 NOTE — Assessment & Plan Note (Signed)
Right side, mild tender, for pain control/predpac as above

## 2017-02-25 NOTE — Progress Notes (Signed)
Subjective:    Patient ID: [name] [name], female    DOB: 04-17-1941, 76 y.o.   MRN: [id number]  HPI  Here with c/o 1 mo right sided mod to severe neck pain, constant, throbbing like, radiates to the right shoulder and elbow, has known right shoulder rot cuff tear per pt after falling off a ladder 2 yrs ago per Dr Jonni Sanger.  Was recommended for surgury but declined to date, does not want surgury if at all possible, even though not better with cortisone at the time.  Now 2 yrs later is having pain flare, this time with at least mild RUE weakness.  Cannot have MRI due to AICD.  Also has tender swelling to right AC joint area, worse to forward elevated or abduct.   Past Medical History:  Diagnosis Date  . ALLERGIC RHINITIS 11/06/2007  . 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 ANEMIA-NOS 06/17/2008  . ANXIETY 02/22/2008  . CARDIOMYOPATHY, PRIMARY, DILATED 12/30/2008  . [place] DISEASE, 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  . GERD 05/07/2007  . HYPERLIPIDEMIA 05/23/2007  . HYPERTENSION 05/07/2007  . 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 MENOPAUSAL DISORDER 05/06/2008  . 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 OVERACTIVE BLADDER 08/03/2010  . PYELONEPHRITIS, HX OF 05/07/2007  . SYSTOLIC HEART FAILURE, CHRONIC 12/28/2008  . VITAMIN D DEFICIENCY 06/17/2008   Past Surgical History:  Procedure Laterality Date  . ABDOMINAL HYSTERECTOMY    . LAMINECTOMY      reports that she has never smoked. She has never used smokeless tobacco. She reports that she does not drink alcohol or use drugs. family history includes Arthritis in her other; Coronary artery disease in her other; Heart disease in her mother; Kidney disease in her other. Allergies  Allergen Reactions  . Zocor [Simvastatin - High Dose] Nausea Only    Nausea    Current Outpatient Prescriptions on File Prior to Visit  Medication Sig Dispense Refill  . albuterol (PROVENTIL HFA;VENTOLIN HFA) 108 (90 BASE) MCG/ACT inhaler Inhale 2 puffs into the  lungs every 6 (six) hours as needed for wheezing or shortness of breath. 3 Inhaler 3  . ALPRAZolam (XANAX) 0.25 MG tablet Take 1 tablet (0.25 mg total) by mouth 2 (two) times daily as needed for anxiety. 60 tablet 2  . carvedilol (COREG) 12.5 MG tablet TAKE 1 TABLET BY MOUTH TWICE A DAY 180 tablet 3  . Cholecalciferol (VITAMIN D3) 1000 UNITS CAPS Take 1 capsule by mouth daily.     . diclofenac (VOLTAREN) 75 MG EC tablet Take 1 tablet (75 mg total) by mouth 2 (two) times daily. 180 tablet 1  . ENTRESTO 24-26 MG TAKE 1 TABLET BY MOUTH 2 (TWO) TIMES DAILY. 60 tablet 11  . esomeprazole (NEXIUM) 40 MG capsule Take 40 mg by mouth daily at 12 noon.    [name] estradiol (ESTRACE) 1 MG tablet Take 1 tablet (1 mg total) by mouth daily. 90 tablet 1  . furosemide (LASIX) 40 MG tablet Take 1.5 tablets (60 mg total) by mouth 2 (two) times daily. 270 tablet 3  . spironolactone (ALDACTONE) 25 MG tablet TAKE 1 TABLET BY MOUTH  DAILY. 90 tablet 2  . triamcinolone (NASACORT AQ) 55 MCG/ACT AERO nasal inhaler Place 2 sprays into the nose daily. 1 Inhaler 12   No current facility-administered medications on file prior to visit.    Review of Systems  Constitutional: Negative for other unusual diaphoresis or sweats HENT: Negative for ear discharge or swelling Eyes: Negative for other worsening visual disturbances Respiratory:  Negative for stridor or other swelling  Gastrointestinal: Negative for worsening distension or other blood Genitourinary: Negative for retention or other urinary change Musculoskeletal: Negative for other MSK pain or swelling Skin: Negative for color change or other new lesions Neurological: Negative for worsening tremors and other numbness  Psychiatric/Behavioral: Negative for worsening agitation or other fatigue All other system neg per pt    Objective:   Physical Exam BP 110/70   Pulse 72   Temp 97.9 F (36.6 C)   Ht 5\' 7"  (1.702 m)   Wt 224 lb (101.6 kg)   SpO2 98%   BMI 35.08 kg/m    VS noted, not ill appearing Constitutional: Pt appears in NAD HENT: Head: NCAT.  Right Ear: External ear normal.  Left Ear: External ear normal.  Eyes: . Pupils are equal, round, and reactive to light. Conjunctivae and EOM are normal Nose: without d/c or deformity Neck: Neck supple. Gross normal ROM Cardiovascular: Normal rate and regular rhythm.   Pulmonary/Chest: Effort normal and breath sounds without rales or wheezing.  Tender to right [facility] joint with trace swelling C-spine with mild right low cervical tenderness and right trapezoid tender Right shoulder NT with decreased ROM to forward elevatoin and abduction to 100 degrees only Neurological: Pt is alert. At baseline orientation, motor grossly intact Skin: Skin is warm. No rashes, other new lesions, no LE edema Psychiatric: Pt behavior is normal without agitation  No other exam findings     Assessment & Plan:

## 2017-02-25 NOTE — Assessment & Plan Note (Signed)
stable overall by history and exam, recent data reviewed with pt, and pt to continue medical treatment as before,  to f/u any worsening symptoms or concerns BP Readings from Last 3 Encounters:  02/22/17 110/70  09/07/16 140/84  09/05/16 126/72

## 2017-02-25 NOTE — Assessment & Plan Note (Signed)
C/w likely flare of pain related to rot cuff dz, for pain control, predpac, and asked to f/u with sport medicine soon,  to f/u any worsening symptoms or concerns

## 2017-03-08 ENCOUNTER — Ambulatory Visit: Payer: Medicare Other | Admitting: Family Medicine

## 2017-03-23 ENCOUNTER — Ambulatory Visit (INDEPENDENT_AMBULATORY_CARE_PROVIDER_SITE_OTHER): Payer: Medicare Other | Admitting: Family Medicine

## 2017-03-23 ENCOUNTER — Encounter: Payer: Self-pay | Admitting: Family Medicine

## 2017-03-23 ENCOUNTER — Ambulatory Visit: Payer: Self-pay

## 2017-03-23 VITALS — BP 120/70 | HR 80 | Ht 67.0 in | Wt 228.0 lb

## 2017-03-23 DIAGNOSIS — M25511 Pain in right shoulder: Secondary | ICD-10-CM

## 2017-03-23 DIAGNOSIS — M75111 Incomplete rotator cuff tear or rupture of right shoulder, not specified as traumatic: Secondary | ICD-10-CM | POA: Diagnosis not present

## 2017-03-23 DIAGNOSIS — M75101 Unspecified rotator cuff tear or rupture of right shoulder, not specified as traumatic: Secondary | ICD-10-CM | POA: Insufficient documentation

## 2017-03-23 MED ORDER — VITAMIN D (ERGOCALCIFEROL) 1.25 MG (50000 UNIT) PO CAPS: 50000.0000 [IU] | ORAL_CAPSULE | ORAL | 0 refills | Status: DC

## 2017-03-23 NOTE — Progress Notes (Signed)
[facility] [address] [place], [place] [address] Phone: [phone] Subjective:    I'm seeing this patient by the request  of:  Biagio Borg, MD   CC: Right shoulder pain  [id number]  [name] [name] is a 76 y.o. female coming in with complaint of right shoulder pain. She explained that she fell a few years ago. She has seen someone else for her shoulder and has rotator cuff issues and bone spurs. She has trouble raising arm. When her pain is at its worse the pain radiates down her arm and causes pain and stiffness in her middle finger.  Onset-  Location- lateral and top of the shoulder  Duration- seems to be constant Character-dull, throbbing aching sensation Aggravating factors- Washing back  Reliving factors-  Therapies tried- Icey hot, tylonol, cortizone shots- does not help Severity- 10/10     Past Medical History:  Diagnosis Date  . ALLERGIC RHINITIS 11/06/2007  . 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 ANEMIA-NOS 06/17/2008  . ANXIETY 02/22/2008  . CARDIOMYOPATHY, PRIMARY, DILATED 12/30/2008  . [place] DISEASE, 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  . GERD 05/07/2007  . HYPERLIPIDEMIA 05/23/2007  . HYPERTENSION 05/07/2007  . 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 MENOPAUSAL DISORDER 05/06/2008  . 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 OVERACTIVE BLADDER 08/03/2010  . PYELONEPHRITIS, HX OF 05/07/2007  . SYSTOLIC HEART FAILURE, CHRONIC 12/28/2008  . VITAMIN D DEFICIENCY 06/17/2008   Past Surgical History:  Procedure Laterality Date  . ABDOMINAL HYSTERECTOMY    . LAMINECTOMY     Social History   Social History  . Marital status: Widowed    Spouse name: N/A  . Number of children: 3  . Years of education: N/A   Occupational History  . 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 Smoking status: Never Smoker  . Smokeless tobacco: Never Used  . Alcohol use No  . Drug use: No  . Sexual activity: No   Other  Topics Concern  . None   Social History Narrative  . None   Allergies  Allergen Reactions  . Zocor [Simvastatin - High Dose] Nausea Only    Nausea    Family History  Problem Relation Age of Onset  . Arthritis Other   . Coronary artery disease Other   . Kidney disease Other   . Heart disease Mother      Past medical history, social, surgical and family history all reviewed in electronic medical record.  No pertanent information unless stated regarding to the chief complaint.   Review of Systems: No headache, visual changes, nausea, vomiting, diarrhea, constipation, dizziness, abdominal pain, skin rash, fevers, chills, night sweats, weight loss, swollen lymph nodes, body aches, joint swelling, chest pain, shortness of breath, mood changes. ' Positive muscle aches  Objective  Blood pressure 120/70, pulse 80, height 5\' 7"  (1.702 m), weight 228 lb (103.4 kg), SpO2 96 %. Systems examined below as of 03/23/17   General: No apparent distress alert and oriented x3 mood and affect normal, dressed appropriately.  HEENT: Pupils equal, extraocular movements intact  Respiratory: Patient's speak in full sentences and does not appear short of breath  Cardiovascular: No lower extremity edema, non tender, no erythema  Skin: Warm dry intact with no signs of infection or rash on extremities or on axial skeleton.  Abdomen: Soft nontender  Neuro: Cranial nerves II through XII are intact, neurovascularly intact in all extremities with 2+ DTRs and 2+ pulses.  Lymph: No lymphadenopathy of posterior or anterior cervical chain or axillae bilaterally.  Gait normal with good balance and coordination.  MSK:  Non tender with full range of motion and good stability and symmetric strength and tone of  elbows, wrist, hip, knee and ankles bilaterally  Shoulder: Right Inspection reveals no abnormalities, atrophy or asymmetry. Palpation is normal with no tenderness over AC joint or bicipital groove. ROM is  full in all planes passively. Rotator cuff strength 4+ out of 5 signs of impingement with positive Neer and Hawkin's tests, but negative empty can sign. Speeds and Yergason's tests normal. No labral pathology noted with negative Obrien's, negative clunk and good stability. Normal scapular function observed. No painful arc and no drop arm sign. No apprehension sign \\Contralateral  shoulder unremarkable  MSK US performed of: Right This study was ordered, performed, and interpreted by Charlann Boxer D.O.  Shoulder:   Supraspinatus:  Degenerative tearing with calcific changes of the supraspinatus noted. Patient also has calcific bursitis noted Infraspinatus:  Atrophy noted Subscapularis: Mild tearing noted but intersubstance Teres Minor:  Appears normal on long and transverse views. AC joint:  Moderate arthritis Glenohumeral Joint:  Mild arthritis Glenoid Labrum: Degenerative changes noted Biceps Tendon:  Positive target sign.  Impression: Subacromial bursitis calcific with degenerative rotator cuff tear  Procedure: 43329; 15 additional minutes spent for Therapeutic exercises as stated in above notes.  This included exercises focusing on stretching, strengthening, with significant focus on eccentric aspects.   Long term goals include an improvement in range of motion, strength, endurance as well as avoiding reinjury. Patient's frequency would include in 1-2 times a day, 3-5 times a week for a duration of 6-12 weeks.  Shoulder Exercises that included:  Basic scapular stabilization to include adduction and depression of scapula Scaption, focusing on proper movement and good control Internal and External rotation utilizing a theraband, with elbow tucked at side entire time Rows with theraband  Proper technique shown and discussed handout in great detail with ATC.  All questions were discussed and answered.     Impression and Recommendations:     This case required medical decision making of  moderate complexity.      Note: This dictation was prepared with Dragon dictation along with smaller phrase technology. Any transcriptional errors that result from this process are unintentional.

## 2017-03-23 NOTE — Assessment & Plan Note (Signed)
Degenerative rotator cuff tear with calcific changes noted. Once weekly vitamin D given, home exercises and patient work with Product/process development scientist. We discussed icing regimen. Topical anti-inflammatories given. Patient declined any type of injection. I'll follow-up again in 4 weeks

## 2017-03-23 NOTE — Progress Notes (Signed)
HPI: FU nonischemic cardiomyopathy. Cardiac catheterization August 2004 showed normal coronary arteries. 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 Last echocardiogram in March of 2015 showed an ejection fraction of 15-20% and mild to moderate mitral regurgitation. 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 Patient was referred to Dr. Lovena Le by Dr. Caryl Comes for consideration of ablation but Dr. Lovena Le felt medical therapy indicated. Nuclear study repeated April 2018 and showed ejection fraction 35% with no ischemia or infarction. Since I last saw her, she does have dyspnea on exertion. No orthopnea or PND. Occasional pedal edema. No exertional chest pain. Some dizziness with standing but no syncope.  Current Outpatient Prescriptions  Medication Sig Dispense Refill  . albuterol (PROVENTIL HFA;VENTOLIN HFA) 108 (90 BASE) MCG/ACT inhaler Inhale 2 puffs into the lungs every 6 (six) hours as needed for wheezing or shortness of breath. 3 Inhaler 3  . ALPRAZolam (XANAX) 0.25 MG tablet Take 1 tablet (0.25 mg total) by mouth 2 (two) times daily as needed for anxiety. 60 tablet 2  . butalbital-acetaminophen-caffeine (FIORICET, ESGIC) 50-325-40 MG tablet TAKE 1 TABLET BY MOUTH TWICE A DAY AS NEEDED FOR HEADACHE 30 tablet 0  . carvedilol (COREG) 12.5 MG tablet TAKE 1 TABLET BY MOUTH TWICE A DAY 180 tablet 3  . diclofenac (VOLTAREN) 75 MG EC tablet Take 1 tablet (75 mg total) by mouth 2 (two) times daily. 180 tablet 1  . ENTRESTO 24-26 MG TAKE 1 TABLET BY MOUTH 2 (TWO) TIMES DAILY. 60 tablet 11  . esomeprazole (NEXIUM) 40 MG capsule Take 40 mg by mouth daily at 12 noon.    [name] estradiol (ESTRACE) 1 MG tablet Take 1 tablet (1 mg total) by mouth daily. 90 tablet 1  . furosemide (LASIX) 40 MG tablet Take 1.5 tablets (60 mg total) by mouth 2 (two) times daily. 270 tablet 3  . HYDROcodone-acetaminophen (NORCO/VICODIN) 5-325 MG tablet Take 1 tablet by mouth every 6 (six) hours as  needed for moderate pain. 30 tablet 0  . promethazine (PHENERGAN) 25 MG tablet Take 1 tablet (25 mg total) by mouth every 6 (six) hours as needed for nausea or vomiting. 40 tablet 1  . spironolactone (ALDACTONE) 25 MG tablet TAKE 1 TABLET BY MOUTH  DAILY. 90 tablet 2  . tiZANidine (ZANAFLEX) 4 MG tablet TAKE 1 TABLET BY MOUTH  EVERY 6 HOURS AS NEEDED. 180 tablet 1  . triamcinolone (NASACORT AQ) 55 MCG/ACT AERO nasal inhaler Place 2 sprays into the nose daily. 1 Inhaler 12  . Vitamin D, Ergocalciferol, (DRISDOL) 50000 units CAPS capsule Take 1 capsule (50,000 Units total) by mouth every 7 (seven) days. 12 capsule 0   No current facility-administered medications for this visit.      Past Medical History:  Diagnosis Date  . ALLERGIC RHINITIS 11/06/2007  . 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 ANEMIA-NOS 06/17/2008  . ANXIETY 02/22/2008  . CARDIOMYOPATHY, PRIMARY, DILATED 12/30/2008  . [place] DISEASE, 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  . GERD 05/07/2007  . HYPERLIPIDEMIA 05/23/2007  . HYPERTENSION 05/07/2007  . 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 MENOPAUSAL DISORDER 05/06/2008  . 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 OVERACTIVE BLADDER 08/03/2010  . PYELONEPHRITIS, HX OF 05/07/2007  . SYSTOLIC HEART FAILURE, CHRONIC 12/28/2008  . VITAMIN D DEFICIENCY 06/17/2008    Past Surgical History:  Procedure Laterality Date  . ABDOMINAL HYSTERECTOMY    . LAMINECTOMY      Social History   Social History  . Marital status:  Widowed    Spouse name: N/A  . Number of children: 3  . Years of education: N/A   Occupational History  . 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 Smoking status: Never Smoker  . Smokeless tobacco: Never Used  . Alcohol use No  . Drug use: No  . Sexual activity: No   Other Topics Concern  . Not on file   Social History Narrative  . No narrative on file    Family History  Problem Relation Age of Onset  . Arthritis Other   .  Coronary artery disease Other   . Kidney disease Other   . Heart disease Mother     ROS: no fevers or chills, productive cough, hemoptysis, dysphasia, odynophagia, melena, hematochezia, dysuria, hematuria, rash, seizure activity, orthopnea, PND, pedal edema, claudication. Remaining systems are negative.  Physical Exam: Well-developed well-nourished in no acute distress.  Skin is warm and dry.  HEENT is normal.  Neck is supple.  Chest is clear to auscultation with normal expansion.  Cardiovascular exam is regular rate and rhythm.  Abdominal exam nontender or distended. No masses palpated. Extremities show 1+ edema. neuro grossly intact   A/P  1 chronic systolic congestive heart failure-patient is mildly volume overloaded. Increase Lasix to 80 mg twice a day. Check potassium, renal function and BNP in 1 week. We discussed the importance of low sodium diet and fluid restriction.   2 nonischemic cardiomyopathy-continue beta blocker, spironolactone and entresto. Repeat echocardiogram.  3 hypertension-blood pressure controlled. Continue present medications.  4 hyperlipidemia-continue statin.  5 history of PVCs-continue beta blocker. Patient previously seen by Dr. Lovena Le and no further intervention indicated based on his evaluation.  6 prior ICD-management per electrophysiology.  Kirk Ruths, MD

## 2017-03-23 NOTE — Patient Instructions (Signed)
Good to see you.  Ice 20 minutes 2 times daily. Usually after activity and before bed. Exercises 3 times a week.  pennsaid pinkie amount topically 2 times daily as needed.  Once weekly vitamin D for 12 weeks to help get the calcium out.  Over the counter get Tart cherry extract 1200mg  at night to help with pain  When lifting try to keep hands within peripheral vision  See me again in 4 weeks.

## 2017-04-04 ENCOUNTER — Ambulatory Visit (INDEPENDENT_AMBULATORY_CARE_PROVIDER_SITE_OTHER): Payer: Medicare Other | Admitting: Cardiology

## 2017-04-04 ENCOUNTER — Encounter: Payer: Self-pay | Admitting: Cardiology

## 2017-04-04 VITALS — BP 108/58 | HR 73 | Ht 67.0 in | Wt 227.4 lb

## 2017-04-04 DIAGNOSIS — E7800 Pure hypercholesterolemia, unspecified: Secondary | ICD-10-CM | POA: Diagnosis not present

## 2017-04-04 DIAGNOSIS — I5022 Chronic systolic (congestive) heart failure: Secondary | ICD-10-CM | POA: Diagnosis not present

## 2017-04-04 DIAGNOSIS — I10 Essential (primary) hypertension: Secondary | ICD-10-CM

## 2017-04-04 DIAGNOSIS — I428 Other cardiomyopathies: Secondary | ICD-10-CM

## 2017-04-04 MED ORDER — FUROSEMIDE 40 MG PO TABS: 80.0000 mg | ORAL_TABLET | Freq: Two times a day (BID) | ORAL | 3 refills | Status: DC

## 2017-04-04 NOTE — Patient Instructions (Signed)
Medication Instructions:   INCREASE FUROSEMIDE TO 80 MG TWICE DAILY= 2 OF THE 40 MG TABLETS TWICE DAILY  Labwork:  Your physician recommends that you return for lab work in: [place]  Testing/Procedures:  Your physician has requested that you have an echocardiogram. Echocardiography is a painless test that uses sound waves to create images of your heart. It provides your doctor with information about the size and shape of your heart and how well your heart's chambers and valves are working. This procedure takes approximately one hour. There are no restrictions for this procedure.    Follow-Up:  Your physician wants you to follow-up in: 4 [place] will receive a reminder letter in the mail two months in advance. If you don't receive a letter, please call our office to schedule the follow-up appointment.   If you need a refill on your cardiac medications before your next appointment, please call your pharmacy.

## 2017-04-13 ENCOUNTER — Other Ambulatory Visit (HOSPITAL_COMMUNITY): Payer: Medicare Other

## 2017-04-13 ENCOUNTER — Telehealth: Payer: Self-pay | Admitting: Cardiology

## 2017-04-13 ENCOUNTER — Other Ambulatory Visit: Payer: Self-pay | Admitting: Internal Medicine

## 2017-04-13 NOTE — Telephone Encounter (Signed)
Would continue present meds for now and follow BP; check bmet and bnp Kirk Ruths

## 2017-04-13 NOTE — Telephone Encounter (Signed)
New Message     Pt c/o medication issue:  1. Name of Medication:   furosemide (LASIX) 40 MG tablet Take 2 tablets (80 mg total) by mouth 2 (two) times daily.     2. How are you currently taking this medication (dosage and times per day)?  2 tab 2 times a day  3. Are you having a reaction (difficulty breathing--STAT)? Yes  4. What is your medication issue? Dizziness , almost passed out at the pharmacy

## 2017-04-13 NOTE — Telephone Encounter (Signed)
Spoke with pt, she reports the dizziness she has been having is getting worse. Her bp is running 106/63 when she checked it last. She feels faint most of the time now. She has not had her lab work done and I stressed the importance of her getting that done. She is going to check her bp for me sitting and then standing. I will call her back in about 30 min.

## 2017-04-13 NOTE — Telephone Encounter (Signed)
Spoke with pt, bp sitting 121/72 and standing 120/75. Will forward for dr Stanford Breed review

## 2017-04-14 NOTE — Telephone Encounter (Signed)
Spoke with pt, Aware of dr crenshaw's recommendations.  °

## 2017-04-19 ENCOUNTER — Ambulatory Visit (HOSPITAL_COMMUNITY): Payer: Medicare Other | Attending: Cardiology

## 2017-04-19 ENCOUNTER — Other Ambulatory Visit: Payer: Self-pay

## 2017-04-19 DIAGNOSIS — I429 Cardiomyopathy, unspecified: Secondary | ICD-10-CM | POA: Insufficient documentation

## 2017-04-19 DIAGNOSIS — I081 Rheumatic disorders of both mitral and tricuspid valves: Secondary | ICD-10-CM | POA: Insufficient documentation

## 2017-04-19 DIAGNOSIS — I5022 Chronic systolic (congestive) heart failure: Secondary | ICD-10-CM | POA: Insufficient documentation

## 2017-04-19 DIAGNOSIS — I493 Ventricular premature depolarization: Secondary | ICD-10-CM | POA: Diagnosis not present

## 2017-04-19 DIAGNOSIS — E785 Hyperlipidemia, unspecified: Secondary | ICD-10-CM | POA: Insufficient documentation

## 2017-04-19 DIAGNOSIS — I110 Hypertensive heart disease with heart failure: Secondary | ICD-10-CM | POA: Insufficient documentation

## 2017-04-19 DIAGNOSIS — Z95810 Presence of automatic (implantable) cardiac defibrillator: Secondary | ICD-10-CM | POA: Diagnosis not present

## 2017-04-20 ENCOUNTER — Ambulatory Visit: Payer: Medicare Other | Admitting: Family Medicine

## 2017-05-02 ENCOUNTER — Ambulatory Visit (INDEPENDENT_AMBULATORY_CARE_PROVIDER_SITE_OTHER): Payer: Medicare Other | Admitting: Family Medicine

## 2017-05-02 ENCOUNTER — Encounter: Payer: Self-pay | Admitting: Family Medicine

## 2017-05-02 ENCOUNTER — Ambulatory Visit: Payer: Self-pay

## 2017-05-02 VITALS — BP 130/70 | HR 85 | Ht 67.0 in | Wt 228.0 lb

## 2017-05-02 DIAGNOSIS — M25511 Pain in right shoulder: Principal | ICD-10-CM

## 2017-05-02 DIAGNOSIS — G8929 Other chronic pain: Secondary | ICD-10-CM

## 2017-05-02 DIAGNOSIS — M75111 Incomplete rotator cuff tear or rupture of right shoulder, not specified as traumatic: Secondary | ICD-10-CM

## 2017-05-02 NOTE — Assessment & Plan Note (Signed)
Patient had worsening symptoms and no improvement with conservative therapy. Given injection today and tolerated the procedure well with increasing range of motion immediately. Hopefully patient will do well with conservative therapy. We discussed icing regimen, home exercises, topical anti-inflammatories and once weekly vitamin D. Follow-up again with me in 4-6 weeks.

## 2017-05-02 NOTE — Progress Notes (Signed)
[facility] [address], [place] [address] Phone: [phone] Subjective:     CC: Right shoulder pain  JSE:[id number]  [name] [name] is a 76 y.o. female coming in with complaint of right shoulder pain. Found to have a calcific bursitis of the right shoulder. Patient did have imaging degenerative rotator cuff tear as well. Patient trying conservative therapy with no significant benefit. Patient is having pain and is waking her up at night and affecting daily activities.      Past Medical History:  Diagnosis Date  . ALLERGIC RHINITIS 11/06/2007  . 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 ANEMIA-NOS 06/17/2008  . ANXIETY 02/22/2008  . CARDIOMYOPATHY, PRIMARY, DILATED 12/30/2008  . [place] DISEASE, 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  . GERD 05/07/2007  . HYPERLIPIDEMIA 05/23/2007  . HYPERTENSION 05/07/2007  . 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 MENOPAUSAL DISORDER 05/06/2008  . 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 OVERACTIVE BLADDER 08/03/2010  . PYELONEPHRITIS, HX OF 05/07/2007  . SYSTOLIC HEART FAILURE, CHRONIC 12/28/2008  . VITAMIN D DEFICIENCY 06/17/2008   Past Surgical History:  Procedure Laterality Date  . ABDOMINAL HYSTERECTOMY    . LAMINECTOMY     Social History   Social History  . Marital status: Widowed    Spouse name: N/A  . Number of children: 3  . Years of education: N/A   Occupational History  . 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 Smoking status: Never Smoker  . Smokeless tobacco: Never Used  . Alcohol use No  . Drug use: No  . Sexual activity: No   Other Topics Concern  . None   Social History Narrative  . None   Allergies  Allergen Reactions  . Zocor [Simvastatin - High Dose] Nausea Only    Nausea    Family History  Problem Relation Age of Onset  . Arthritis Other   . Coronary artery disease Other   . Kidney disease Other   . Heart disease Mother      Past  medical history, social, surgical and family history all reviewed in electronic medical record.  No pertanent information unless stated regarding to the chief complaint.   Review of Systems:Review of systems updated and as accurate as of 05/02/17  No headache, visual changes, nausea, vomiting, diarrhea, constipation, dizziness, abdominal pain, skin rash, fevers, chills, night sweats, weight loss, swollen lymph nodes, body aches, joint swelling, chest pain, shortness of breath, mood changes. Positive muscle aches  Objective  Blood pressure 130/70, pulse 85, height 5\' 7"  (1.702 m), weight 228 lb (103.4 kg), SpO2 97 %. Systems examined below as of 05/02/17   General: No apparent distress alert and oriented x3 mood and affect normal, dressed appropriately.  HEENT: Pupils equal, extraocular movements intact  Respiratory: Patient's speak in full sentences and does not appear short of breath  Cardiovascular: No lower extremity edema, non tender, no erythema  Skin: Warm dry intact with no signs of infection or rash on extremities or on axial skeleton.  Abdomen: Soft nontender  Neuro: Cranial nerves II through XII are intact, neurovascularly intact in all extremities with 2+ DTRs and 2+ pulses.  Lymph: No lymphadenopathy of posterior or anterior cervical chain or axillae bilaterally.  Gait normal with good balance and coordination.  MSK:  Non tender with full range of motion and good stability and symmetric strength and tone of  elbows, wrist, hip, knee and ankles bilaterally.  Shoulder: Right Inspection reveals no  abnormalities, atrophy or asymmetry. Palpation is normal with no tenderness over AC joint or bicipital groove. ROM is full in all planes passively. Rotator cuff strength 4 out of 5 compared to the contralateral side signs of impingement with positive Neer and Hawkin's tests, but negative empty can sign. Speeds and Yergason's tests normal. No labral pathology noted with negative Obrien's,  negative clunk and good stability. Normal scapular function observed. No painful arc and no drop arm sign. No apprehension sign Contralateral shoulder unremarkable   Procedure: Real-time Ultrasound Guided Injection of right glenohumeral joint Device: GE Logiq E  Ultrasound guided injection is preferred based studies that show increased duration, increased effect, greater accuracy, decreased procedural pain, increased response rate with ultrasound guided versus blind injection.  Verbal informed consent obtained.  Time-out conducted.  Noted no overlying erythema, induration, or other signs of local infection.  Skin prepped in a sterile fashion.  Local anesthesia: Topical Ethyl chloride.  With sterile technique and under real time ultrasound guidance:  Joint visualized.  23g 1  inch needle inserted posterior approach. Pictures taken for needle placement. Patient did have injection of 2 cc of 1% lidocaine, 2 cc of 0.5% Marcaine, and 1.0 cc of Kenalog 40 mg/dL. Completed without difficulty  Pain immediately resolved suggesting accurate placement of the medication.  Advised to call if fevers/chills, erythema, induration, drainage, or persistent bleeding.  Images permanently stored and available for review in the ultrasound unit.  Impression: Technically successful ultrasound guided injection.    Impression and Recommendations:     This case required medical decision making of moderate complexity.      Note: This dictation was prepared with Dragon dictation along with smaller phrase technology. Any transcriptional errors that result from this process are unintentional.

## 2017-05-02 NOTE — Patient Instructions (Addendum)
Good to see you  Injected the shoulder pain Ice is your friend.  Start the exercises again in 3 days and do them 3 times a week See me again in 4-6 weeks.

## 2017-05-11 ENCOUNTER — Telehealth: Payer: Self-pay | Admitting: Internal Medicine

## 2017-05-11 NOTE — Telephone Encounter (Signed)
Pt called and cannot move her back hurts so bad, states she cannot even go to the bathroom, Please advise and call back in regard

## 2017-05-11 NOTE — Telephone Encounter (Signed)
I suggest she goes to the ER.

## 2017-05-11 NOTE — Telephone Encounter (Signed)
Pt notified of response

## 2017-05-16 ENCOUNTER — Encounter: Payer: Self-pay | Admitting: Internal Medicine

## 2017-05-16 ENCOUNTER — Ambulatory Visit (INDEPENDENT_AMBULATORY_CARE_PROVIDER_SITE_OTHER): Payer: Medicare Other | Admitting: Internal Medicine

## 2017-05-16 ENCOUNTER — Other Ambulatory Visit (INDEPENDENT_AMBULATORY_CARE_PROVIDER_SITE_OTHER): Payer: Medicare Other

## 2017-05-16 VITALS — BP 144/86 | HR 85 | Temp 98.0°F | Ht 67.0 in | Wt 224.0 lb

## 2017-05-16 DIAGNOSIS — Z0001 Encounter for general adult medical examination with abnormal findings: Secondary | ICD-10-CM

## 2017-05-16 DIAGNOSIS — I10 Essential (primary) hypertension: Secondary | ICD-10-CM

## 2017-05-16 DIAGNOSIS — M5416 Radiculopathy, lumbar region: Secondary | ICD-10-CM | POA: Diagnosis not present

## 2017-05-16 LAB — HEPATIC FUNCTION PANEL
ALBUMIN: 3.8 g/dL (ref 3.5–5.2)
ALT: 9 U/L (ref 0–35)
AST: 11 U/L (ref 0–37)
Alkaline Phosphatase: 64 U/L (ref 39–117)
BILIRUBIN DIRECT: 0.1 mg/dL (ref 0.0–0.3)
TOTAL PROTEIN: 7.9 g/dL (ref 6.0–8.3)
Total Bilirubin: 0.3 mg/dL (ref 0.2–1.2)

## 2017-05-16 LAB — BASIC METABOLIC PANEL
BUN: 19 mg/dL (ref 6–23)
CO2: 27 mEq/L (ref 19–32)
Calcium: 9.6 mg/dL (ref 8.4–10.5)
Chloride: 103 mEq/L (ref 96–112)
Creatinine, Ser: 1.38 mg/dL — ABNORMAL HIGH (ref 0.40–1.20)
GFR: 48 mL/min — AB (ref >60.00)
Glucose, Bld: 99 mg/dL (ref 70–99)
POTASSIUM: 4.3 meq/L (ref 3.5–5.1)
SODIUM: 139 meq/L (ref 135–145)

## 2017-05-16 LAB — CBC WITH DIFFERENTIAL/PLATELET
BASOS PCT: 0.5 % (ref 0.0–3.0)
Basophils Absolute: 0 10*3/uL (ref 0.0–0.1)
EOS PCT: 1.6 % (ref 0.0–5.0)
Eosinophils Absolute: 0.1 10*3/uL (ref 0.0–0.7)
HEMATOCRIT: 35.7 % — AB (ref 36.0–46.0)
HEMOGLOBIN: 11.3 g/dL — AB (ref 12.0–15.0)
LYMPHS PCT: 49.6 % — AB (ref 12.0–46.0)
Lymphs Abs: 3.9 10*3/uL (ref 0.7–4.0)
MCHC: 31.6 g/dL (ref 30.0–36.0)
MCV: 82.9 fl (ref 78.0–100.0)
MONOS PCT: 6.6 % (ref 3.0–12.0)
Monocytes Absolute: 0.5 10*3/uL (ref 0.1–1.0)
Neutro Abs: 3.3 10*3/uL (ref 1.4–7.7)
Neutrophils Relative %: 41.7 % — ABNORMAL LOW (ref 43.0–77.0)
Platelets: 304 10*3/uL (ref 150.0–400.0)
RBC: 4.3 Mil/uL (ref 3.87–5.11)
RDW: 15.2 % (ref 11.5–15.5)
WBC: 8 10*3/uL (ref 4.0–10.5)

## 2017-05-16 LAB — LIPID PANEL
CHOLESTEROL: 202 mg/dL — AB (ref 0–200)
HDL: 52.8 mg/dL (ref >39.00)
LDL Cholesterol: 124 mg/dL — ABNORMAL HIGH (ref 0–99)
NonHDL: 149.2
Total CHOL/HDL Ratio: 4
Triglycerides: 124 mg/dL (ref 0.0–149.0)
VLDL: 24.8 mg/dL (ref 0.0–40.0)

## 2017-05-16 LAB — TSH: TSH: 0.79 u[IU]/mL (ref 0.35–4.50)

## 2017-05-16 MED ORDER — TRAMADOL HCL 50 MG PO TABS: 50.0000 mg | ORAL_TABLET | Freq: Four times a day (QID) | ORAL | 2 refills | Status: DC | PRN

## 2017-05-16 MED ORDER — IBUPROFEN 800 MG PO TABS: 800.0000 mg | ORAL_TABLET | Freq: Three times a day (TID) | ORAL | 0 refills | Status: DC | PRN

## 2017-05-16 MED ORDER — PREDNISONE 10 MG PO TABS: ORAL_TABLET | ORAL | 0 refills | Status: DC

## 2017-05-16 MED ORDER — GABAPENTIN 100 MG PO CAPS: 100.0000 mg | ORAL_CAPSULE | Freq: Three times a day (TID) | ORAL | 3 refills | Status: DC

## 2017-05-16 NOTE — Patient Instructions (Signed)
Please take all new medication as prescribed - the ibuprofen sparingly for pain, the tramadol for pain, gabapentin 100 mg three times per day for nerve pain, and prednisone for a short time  Please continue all other medications as before, and refills have been done if requested.  Please have the pharmacy call with any other refills you may need.  Please continue your efforts at being more active, low cholesterol diet, and weight control.  You are otherwise up to date with prevention measures today.  Please keep your appointments with your specialists as you may have planned - Dr Tamala Julian Dec 5  Please go to the LAB in the Basement (turn left off the elevator) for the tests to be done today  You will be contacted by phone if any changes need to be made immediately.  Otherwise, you will receive a letter about your results with an explanation, but please check with MyChart first.  Please remember to sign up for MyChart if you have not done so, as this will be important to you in the future with finding out test results, communicating by private email, and scheduling acute appointments online when needed.  Please return in 6 months, or sooner if needed

## 2017-05-16 NOTE — Progress Notes (Signed)
Subjective:    Patient ID: [name] [name], female    DOB: [birth year], 76 y.o.   MRN: [id number]  HPI  Here for wellness and f/u;  Overall doing ok;  Pt denies Chest pain, worsening SOB, DOE, wheezing, orthopnea, PND, worsening LE edema, palpitations, dizziness or syncope.  Pt denies neurological change such as new headache, facial or extremity weakness.  Pt denies polydipsia, polyuria, or low sugar symptoms. Pt states overall good compliance with treatment and medications, good tolerability, and has been trying to follow appropriate diet.  Pt denies worsening depressive symptoms, suicidal ideation or panic. No fever, night sweats, wt loss, loss of appetite, or other constitutional symptoms.  Pt states good ability with ADL's, has low fall risk, home safety reviewed and adequate, no other significant changes in hearing or vision, and only occasionally active with exercise.  Declines flu and Tdap today, also declines dxa.  Plans to do cologuard (has kit at home) instead of colonoscopy due to heart risk.  Also, interval hx includes acute complaints:  C/o 4-5 days onset mod to severe pain (including last 4 days in bed), with some soreness and pain to the right lateral back, but also LLE pain radiating to the distal LLE, with some weakness and tingling, some improved with walking with crutches today.  Tylenol no help, but a friends ibuprofen 800 did lead to at least mild improvement.  Has hx of AICD/not MRI eligible.  Has f/u appt with Dr Smith/sport med on dec 5.   Past Medical History:  Diagnosis Date  . ALLERGIC RHINITIS 11/06/2007  . 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 ANEMIA-NOS 06/17/2008  . ANXIETY 02/22/2008  . CARDIOMYOPATHY, PRIMARY, DILATED 12/30/2008  . [place] DISEASE, 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  . GERD 05/07/2007  . HYPERLIPIDEMIA 05/23/2007  . HYPERTENSION 05/07/2007  . 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 MENOPAUSAL DISORDER 05/06/2008  . 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 OVERACTIVE BLADDER 08/03/2010  . PYELONEPHRITIS, HX OF 05/07/2007  . SYSTOLIC HEART FAILURE, CHRONIC 12/28/2008  . VITAMIN D DEFICIENCY 06/17/2008   Past Surgical History:  Procedure Laterality Date  . ABDOMINAL HYSTERECTOMY    . LAMINECTOMY      reports that  has never smoked. she has never used smokeless tobacco. She reports that she does not drink alcohol or use drugs. family history includes Arthritis in her other; Coronary artery disease in her other; Heart disease in her mother; Kidney disease in her other. Allergies  Allergen Reactions  . Zocor [Simvastatin - High Dose] Nausea Only    Nausea    Current Outpatient Medications on File Prior to Visit  Medication Sig Dispense Refill  . albuterol (PROVENTIL HFA;VENTOLIN HFA) 108 (90 BASE) MCG/ACT inhaler Inhale 2 puffs into the lungs every 6 (six) hours as needed for wheezing or shortness of breath. 3 Inhaler 3  . ALPRAZolam (XANAX) 0.25 MG tablet Take 1 tablet (0.25 mg total) by mouth 2 (two) times daily as needed for anxiety. 60 tablet 2  . butalbital-acetaminophen-caffeine (FIORICET, ESGIC) 50-325-40 MG tablet TAKE 1 TABLET BY MOUTH TWICE A DAY AS NEEDED FOR HEADACHE 30 tablet 0  . carvedilol (COREG) 12.5 MG tablet TAKE 1 TABLET BY MOUTH TWICE A DAY 180 tablet 3  . diclofenac (VOLTAREN) 75 MG EC tablet TAKE 1 TABLET BY MOUTH TWO  TIMES DAILY 180 tablet 0  . ENTRESTO 24-26 MG TAKE 1 TABLET BY MOUTH 2 (TWO) TIMES DAILY. 60 tablet 11  . esomeprazole (NEXIUM) 40 MG capsule Take  40 mg by mouth daily at 12 noon.    [name] estradiol (ESTRACE) 1 MG tablet Take 1 tablet (1 mg total) by mouth daily. 90 tablet 1  . furosemide (LASIX) 40 MG tablet Take 2 tablets (80 mg total) by mouth 2 (two) times daily. 180 tablet 3  . HYDROcodone-acetaminophen (NORCO/VICODIN) 5-325 MG tablet Take 1 tablet by mouth every 6 (six) hours as needed for moderate pain. 30 tablet 0  . promethazine (PHENERGAN) 25 MG tablet Take 1 tablet (25 mg total) by mouth every 6  (six) hours as needed for nausea or vomiting. 40 tablet 1  . spironolactone (ALDACTONE) 25 MG tablet TAKE 1 TABLET BY MOUTH  DAILY. 90 tablet 2  . tiZANidine (ZANAFLEX) 4 MG tablet TAKE 1 TABLET BY MOUTH  EVERY 6 HOURS AS NEEDED. 180 tablet 1  . triamcinolone (NASACORT AQ) 55 MCG/ACT AERO nasal inhaler Place 2 sprays into the nose daily. 1 Inhaler 12  . Vitamin D, Ergocalciferol, (DRISDOL) 50000 units CAPS capsule Take 1 capsule (50,000 Units total) by mouth every 7 (seven) days. 12 capsule 0   No current facility-administered medications on file prior to visit.     Review of Systems Constitutional: Negative for other unusual diaphoresis, sweats, appetite or weight changes HENT: Negative for other worsening hearing loss, ear pain, facial swelling, mouth sores or neck stiffness.   Eyes: Negative for other worsening pain, redness or other visual disturbance.  Respiratory: Negative for other stridor or swelling Cardiovascular: Negative for other palpitations or other chest pain  Gastrointestinal: Negative for worsening diarrhea or loose stools, blood in stool, distention or other pain Genitourinary: Negative for hematuria, flank pain or other change in urine volume.  Musculoskeletal: Negative for myalgias or other joint swelling.  Skin: Negative for other color change, or other wound or worsening drainage.  Neurological: Negative for other syncope or numbness. Hematological: Negative for other adenopathy or swelling Psychiatric/Behavioral: Negative for hallucinations, other worsening agitation, SI, self-injury, or new decreased concentration All other system neg per pt    Objective:   Physical Exam BP (!) 144/86   Pulse 85   Temp 98 F (36.7 C) (Oral)   Ht 5' 7"  (1.702 m)   Wt 224 lb (101.6 kg)   SpO2 99%   BMI 35.08 kg/m  VS noted,  Constitutional: Pt is oriented to person, place, and time. Appears well-developed and well-nourished, in no significant distress and comfortable Head:  Normocephalic and atraumatic  Eyes: Conjunctivae and EOM are normal. Pupils are equal, round, and reactive to light Right Ear: External ear normal without discharge Left Ear: External ear normal without discharge Nose: Nose without discharge or deformity Mouth/Throat: Oropharynx is without other ulcerations and moist  Neck: Normal range of motion. Neck supple. No JVD present. No tracheal deviation present or significant neck LA or mass Cardiovascular: Normal rate, regular rhythm, normal heart sounds and intact distal pulses.   Pulmonary/Chest: WOB normal and breath sounds without rales or wheezing  Abdominal: Soft. Bowel sounds are normal. NT. No HSM  Musculoskeletal: Normal range of motion. Exhibits no edema Spine nontender in the midline or left paravertebral or buttock/leg; does have mild tender right lumbar paravertebral Lymphadenopathy: Has no other cervical adenopathy.  Neurological: Pt is alert and oriented to person, place, and time. Pt has normal reflexes. No cranial nerve deficit. Motor with 4-4+ left > right motor deficit intact, Gait slow and antalgic with crutch use to support the LLE Skin: Skin is warm and dry. No rash noted or  new ulcerations Psychiatric:  Has normal mood and affect. Behavior is normal without agitation No other exam findings      MRI OF THE LUMBAR SPINE WITHOUT CONTRAST - 06-24-2002 IMPRESSION CENTRAL AND SUBARTICULAR LATERAL RECESS STENOSES AT L4-5.  RIGHT FORAMINAL STENOSIS AND EXTRAFORAMINAL MASS EFFECT ON THE RIGHT L5 NERVE ROOT AT THE L5-S1 LEVEL DUE TO A BROAD-BASED DISK BULGE AND RIGHT FORAMINAL AND EXTRAFORAMINAL DISK HERNIATION    Assessment & Plan:

## 2017-05-17 ENCOUNTER — Encounter: Payer: Self-pay | Admitting: Internal Medicine

## 2017-05-17 LAB — URINALYSIS, ROUTINE W REFLEX MICROSCOPIC
Bilirubin Urine: NEGATIVE
Ketones, ur: NEGATIVE
Nitrite: NEGATIVE
PH: 5 (ref 5.0–8.0)
Urine Glucose: NEGATIVE
Urobilinogen, UA: 0.2 (ref 0.0–1.0)

## 2017-05-19 DIAGNOSIS — I5022 Chronic systolic (congestive) heart failure: Secondary | ICD-10-CM | POA: Diagnosis not present

## 2017-05-20 ENCOUNTER — Encounter: Payer: Self-pay | Admitting: Internal Medicine

## 2017-05-20 LAB — BASIC METABOLIC PANEL
BUN/Creatinine Ratio: 16 (ref 12–28)
BUN: 21 mg/dL (ref 8–27)
CALCIUM: 9.4 mg/dL (ref 8.7–10.3)
CO2: 25 mmol/L (ref 20–29)
CREATININE: 1.34 mg/dL — AB (ref 0.57–1.00)
Chloride: 101 mmol/L (ref 96–106)
GFR calc Af Amer: 45 mL/min/{1.73_m2} — ABNORMAL LOW (ref >59)
GFR, EST NON AFRICAN AMERICAN: 39 mL/min/{1.73_m2} — AB (ref >59)
Glucose: 93 mg/dL (ref 65–99)
POTASSIUM: 5.4 mmol/L — AB (ref 3.5–5.2)
Sodium: 140 mmol/L (ref 134–144)

## 2017-05-20 LAB — BRAIN NATRIURETIC PEPTIDE: BNP: 366.8 pg/mL — ABNORMAL HIGH (ref 0.0–100.0)

## 2017-05-20 NOTE — Assessment & Plan Note (Addendum)
New onset, not MRI eligible due to AICD, for ibuprofen/tramadol prn, predpac asd, to f/u with sport med as planned per pt on dec 5 and declines surgical evaliuation for now  In addition to the time spent performing CPE, I spent an additional 25 minutes face to face,in which greater than 50% of this time was spent in counseling and coordination of care for patient's acute illness as documented, including differential dx, tx, further evaluation and other management of left lumbar radiculopathy, and HTN

## 2017-05-20 NOTE — Assessment & Plan Note (Signed)

## 2017-05-20 NOTE — Assessment & Plan Note (Signed)
Mild elevated likely situational, o/w stable overall by history and exam, recent data reviewed with pt, and pt to continue medical treatment as before,  to f/u any worsening symptoms or concerns BP Readings from Last 3 Encounters:  05/16/17 (!) 144/86  05/02/17 130/70  04/04/17 (!) 108/58

## 2017-05-22 ENCOUNTER — Telehealth: Payer: Self-pay | Admitting: *Deleted

## 2017-05-22 DIAGNOSIS — E875 Hyperkalemia: Secondary | ICD-10-CM

## 2017-05-22 NOTE — Telephone Encounter (Addendum)
-----   Message from Lelon Perla, MD sent at 05/21/2017  5:39 AM EST ----- Dc spironolactone, bmet 1 week Kirk Ruths   Patient voiced understanding to stop medication. Lab orders mailed to the pt

## 2017-05-25 ENCOUNTER — Telehealth: Payer: Self-pay | Admitting: Internal Medicine

## 2017-05-25 NOTE — Telephone Encounter (Signed)
Pt would like a walker (the one with wheels and seat) and a cane. She did not have a preference of where to send it.

## 2017-05-25 NOTE — Telephone Encounter (Signed)
Done hardcopy to [facility]  

## 2017-05-25 NOTE — Telephone Encounter (Signed)
Faxed to [facility]  

## 2017-06-02 DIAGNOSIS — M5416 Radiculopathy, lumbar region: Secondary | ICD-10-CM | POA: Diagnosis not present

## 2017-06-14 ENCOUNTER — Ambulatory Visit: Payer: Medicare Other | Admitting: Family Medicine

## 2017-06-20 ENCOUNTER — Other Ambulatory Visit: Payer: Self-pay | Admitting: Internal Medicine

## 2017-07-06 ENCOUNTER — Encounter: Payer: Self-pay | Admitting: *Deleted

## 2017-07-09 NOTE — Progress Notes (Signed)
[facility] [address], [place] [address] Phone: [phone] Subjective:    I'm seeing this patient by the request  of:    CC: Right shoulder pain  [id number]  [name] [name] is a 76 y.o. female coming in with complaint of right shoulder pain.  Found to have more of a calcific bursitis.  With also a underlying degenerative rotator cuff tear.  Patient was given an injection October 23.  Therapy including home exercises.  Patient states that her shoulder is hurting. She said that she has 2 months of relief from the last injection.     Past Medical History:  Diagnosis Date  . ALLERGIC RHINITIS 11/06/2007  . 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 ANEMIA-NOS 06/17/2008  . ANXIETY 02/22/2008  . CARDIOMYOPATHY, PRIMARY, DILATED 12/30/2008  . [place] DISEASE, 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  . GERD 05/07/2007  . HYPERLIPIDEMIA 05/23/2007  . HYPERTENSION 05/07/2007  . 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 MENOPAUSAL DISORDER 05/06/2008  . 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 OVERACTIVE BLADDER 08/03/2010  . PYELONEPHRITIS, HX OF 05/07/2007  . SYSTOLIC HEART FAILURE, CHRONIC 12/28/2008  . VITAMIN D DEFICIENCY 06/17/2008   Past Surgical History:  Procedure Laterality Date  . ABDOMINAL HYSTERECTOMY    . LAMINECTOMY     Social History   Socioeconomic History  . Marital status: Widowed    Spouse name: None  . Number of children: 3  . Years of education: None  . Highest education level: None  Social Needs  . Financial resource strain: None  . Food insecurity - worry: None  . Food insecurity - inability: None  . Transportation needs - medical: None  . Transportation needs - non-medical: None  Occupational History  . Occupation: retired Ambulance person: RETIRED  Tobacco Use  . Smoking status: Never Smoker  . Smokeless tobacco: Never Used  Substance and Sexual Activity  . Alcohol use: No  . Drug use: No  . Sexual  activity: No  Other Topics Concern  . None  Social History Narrative  . None   Allergies  Allergen Reactions  . Zocor [Simvastatin - High Dose] Nausea Only    Nausea    Family History  Problem Relation Age of Onset  . Arthritis Other   . Coronary artery disease Other   . Kidney disease Other   . Heart disease Mother      Past medical history, social, surgical and family history all reviewed in electronic medical record.  No pertanent information unless stated regarding to the chief complaint.   Review of Systems:Review of systems updated and as accurate as of 07/10/17  No headache, visual changes, nausea, vomiting, diarrhea, constipation, dizziness, abdominal pain, skin rash, fevers, chills, night sweats, weight loss, swollen lymph nodes, body aches, joint swelling,chest pain, shortness of breath, mood changes.  Positive muscle aches  Objective  Blood pressure 134/84, pulse 87, height 5\' 7"  (1.702 m), weight 224 lb (101.6 kg), SpO2 98 %. Systems examined below as of 07/10/17   General: No apparent distress alert and oriented x3 mood and affect normal, dressed appropriately.  HEENT: Pupils equal, extraocular movements intact  Respiratory: Patient's speak in full sentences and does not appear short of breath  Cardiovascular: No lower extremity edema, non tender, no erythema  Skin: Warm dry intact with no signs of infection or rash on extremities or on axial skeleton.  Abdomen: Soft nontender  Neuro: Cranial nerves II through XII are intact,  neurovascularly intact in all extremities with 2+ DTRs and 2+ pulses.  Lymph: No lymphadenopathy of posterior or anterior cervical chain or axillae bilaterally.  Gait antalgic MSK:  Non tender with full range of motion and good stability and symmetric strength and tone of, elbows, wrist, hip, knee and ankles bilaterally.  Arthritic changes of multiple joints Right shoulder shows that patient does have some mild limitation in all planes.   Positive empty can sign.  4 out of 5 strength compared to the contralateral side.  Procedure: Real-time Ultrasound Guided Injection of right glenohumeral joint Device: GE Logiq Q7  Ultrasound guided injection is preferred based studies that show increased duration, increased effect, greater accuracy, decreased procedural pain, increased response rate with ultrasound guided versus blind injection.  Verbal informed consent obtained.  Time-out conducted.  Noted no overlying erythema, induration, or other signs of local infection.  Skin prepped in a sterile fashion.  Local anesthesia: Topical Ethyl chloride.  With sterile technique and under real time ultrasound guidance:  Joint visualized.  23g 1  inch needle inserted posterior approach. Pictures taken for needle placement. Patient did have injection of 2 cc of 1% lidocaine, 2 cc of 0.5% Marcaine, and 1.0 cc of Kenalog 40 mg/dL. Completed without difficulty  Pain immediately resolved suggesting accurate placement of the medication.  Advised to call if fevers/chills, erythema, induration, drainage, or persistent bleeding.  Images permanently stored and available for review in the ultrasound unit.  Impression: Technically successful ultrasound guided injection.    Impression and Recommendations:     This case required medical decision making of moderate complexity.      Note: This dictation was prepared with Dragon dictation along with smaller phrase technology. Any transcriptional errors that result from this process are unintentional.

## 2017-07-10 ENCOUNTER — Ambulatory Visit: Payer: Self-pay

## 2017-07-10 ENCOUNTER — Encounter: Payer: Self-pay | Admitting: Family Medicine

## 2017-07-10 ENCOUNTER — Ambulatory Visit: Payer: Medicare Other | Admitting: Family Medicine

## 2017-07-10 VITALS — BP 134/84 | HR 87 | Ht 67.0 in | Wt 224.0 lb

## 2017-07-10 DIAGNOSIS — M25511 Pain in right shoulder: Principal | ICD-10-CM

## 2017-07-10 DIAGNOSIS — G8929 Other chronic pain: Secondary | ICD-10-CM

## 2017-07-10 DIAGNOSIS — M75111 Incomplete rotator cuff tear or rupture of right shoulder, not specified as traumatic: Secondary | ICD-10-CM

## 2017-07-10 NOTE — Assessment & Plan Note (Signed)
Patient given injection today and tolerated the procedure well.  We discussed icing regimen and home exercises.  We discussed which activities to do which wants to avoid.  Patient is to increase activity slowly over the course the next several days.  Follow-up with me again in 4-6 weeks

## 2017-07-10 NOTE — Patient Instructions (Signed)
Good to see you  Ice 20 minutes 2 times daily. Usually after activity and before bed. Exercises 3 times a week.  Happy New Year!  See me again in 4 weeks

## 2017-07-18 ENCOUNTER — Other Ambulatory Visit: Payer: Self-pay | Admitting: Internal Medicine

## 2017-07-18 NOTE — Telephone Encounter (Signed)
This is not normally a refillable medication on request.  Please consider ROV

## 2017-08-07 ENCOUNTER — Ambulatory Visit: Payer: Medicare Other | Admitting: Family Medicine

## 2017-08-23 ENCOUNTER — Telehealth: Payer: Self-pay | Admitting: Internal Medicine

## 2017-08-23 MED ORDER — TRIAMCINOLONE ACETONIDE 55 MCG/ACT NA AERO: 2.0000 | INHALATION_SPRAY | Freq: Every day | NASAL | 5 refills | Status: DC

## 2017-08-23 NOTE — Telephone Encounter (Signed)
Copied from [place]. Topic: General - Other >> Aug 23, 2017  1:25 PM Neva Seat wrote: Nasacort  Nasal passages are swollen and has used up all of the spray.  Needing a refill.  [id number], [place] - [address] [place] [address] Phone: [phone] Fax: [phone]

## 2017-08-23 NOTE — Telephone Encounter (Signed)
Copied from [place]. Topic: General - Other >> Aug 23, 2017  1:25 PM Neva Seat wrote: Nasacort  Nasal passages are swollen and has used up all of the spray.  Needing a refill.  [id number], [place] - [address] [place] [address] Phone: [phone] Fax: [phone]

## 2017-08-23 NOTE — Telephone Encounter (Signed)
Left message for pt regarding prescription refill at [phone]; last prescription written 10/14/14; pt seen in office 05/16/17 for well adult exam but no new prescription requested' will route to [facility] for review

## 2017-08-23 NOTE — Telephone Encounter (Signed)
Patient called and stated that she needs a new prescription for the nasal spray sent to the [facility] in [place]. Please advise.

## 2017-08-23 NOTE — Telephone Encounter (Signed)
Reviewed chart pt is up-to-date sent refills to [facility]  

## 2017-08-27 ENCOUNTER — Other Ambulatory Visit: Payer: Self-pay | Admitting: Internal Medicine

## 2017-08-28 ENCOUNTER — Ambulatory Visit: Payer: Medicare Other | Admitting: Family Medicine

## 2017-08-31 ENCOUNTER — Ambulatory Visit: Payer: Medicare Other | Admitting: Family Medicine

## 2017-09-06 ENCOUNTER — Telehealth: Payer: Self-pay | Admitting: Internal Medicine

## 2017-09-06 NOTE — Telephone Encounter (Signed)
Spoke with pts daughter helped her set up home monitor and send in a transmission, transmission received device noted to be at [facility], informed pts daughter that she would need a sooner apt with Dr. Caryl Comes than April to discuss changing the device out. Informed her that I would speak with scheduler and call her back pts daughter voiced understanding .

## 2017-09-06 NOTE — Telephone Encounter (Signed)
°  1. Has your device fired? no  2. Is you device beeping? no  3. Are you experiencing draining or swelling at device site? no  4. Are you calling to see if we received your device transmission? no  5. Have you passed out? No  Patient states that she needs instructions on sending in device transmission    Please route to [place]

## 2017-09-06 NOTE — Telephone Encounter (Signed)
Offered pts daughter apt for tomorrow she stated that pt is running a fever and doesn't think she will be able to come tomorrow. Informed her that I would have a scheduler call her to make apt. She voiced understanding

## 2017-09-07 ENCOUNTER — Encounter: Payer: Medicare Other | Admitting: Internal Medicine

## 2017-09-11 ENCOUNTER — Ambulatory Visit: Payer: Medicare Other | Admitting: Family Medicine

## 2017-09-28 ENCOUNTER — Other Ambulatory Visit: Payer: Self-pay | Admitting: Internal Medicine

## 2017-10-03 ENCOUNTER — Encounter: Payer: Self-pay | Admitting: Internal Medicine

## 2017-10-03 ENCOUNTER — Ambulatory Visit (INDEPENDENT_AMBULATORY_CARE_PROVIDER_SITE_OTHER): Payer: Medicare Other | Admitting: Internal Medicine

## 2017-10-03 VITALS — BP 114/72 | HR 84 | Temp 98.4°F | Ht 67.0 in | Wt 218.0 lb

## 2017-10-03 DIAGNOSIS — I10 Essential (primary) hypertension: Secondary | ICD-10-CM | POA: Diagnosis not present

## 2017-10-03 DIAGNOSIS — R05 Cough: Secondary | ICD-10-CM | POA: Diagnosis not present

## 2017-10-03 DIAGNOSIS — R059 Cough, unspecified: Secondary | ICD-10-CM | POA: Insufficient documentation

## 2017-10-03 DIAGNOSIS — R062 Wheezing: Secondary | ICD-10-CM

## 2017-10-03 MED ORDER — HYDROCODONE-HOMATROPINE 5-1.5 MG/5ML PO SYRP: 5.0000 mL | ORAL_SOLUTION | Freq: Four times a day (QID) | ORAL | 0 refills | Status: AC | PRN

## 2017-10-03 MED ORDER — PREDNISONE 10 MG PO TABS: ORAL_TABLET | ORAL | 0 refills | Status: DC

## 2017-10-03 MED ORDER — AZITHROMYCIN 250 MG PO TABS: ORAL_TABLET | ORAL | 1 refills | Status: DC

## 2017-10-03 NOTE — Patient Instructions (Addendum)
Please take all new medication as prescribed - the antibiotic, cough medicine and prednisone  Please continue all other medications as before, and refills have been done if requested.  Please have the pharmacy call with any other refills you may need.  Please keep your appointments with your specialists as you may have planned     

## 2017-10-03 NOTE — Assessment & Plan Note (Signed)
stable overall by history and exam, recent data reviewed with pt, and pt to continue medical treatment as before,  to f/u any worsening symptoms or concerns BP Readings from Last 3 Encounters:  10/03/17 114/72  07/10/17 134/84  05/16/17 (!) 144/86

## 2017-10-03 NOTE — Assessment & Plan Note (Signed)
Mild to mod, c/w bronchitis vs pna, declines cxr, for antibx course, to f/u any worsening symptoms or concerns  

## 2017-10-03 NOTE — Progress Notes (Signed)
HPI: FU nonischemic cardiomyopathy. Cardiac catheterization August 2004 showed normal coronary arteries. 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 Patient was referred to Dr. Lovena Le by Dr. Caryl Comes for consideration of ablation but Dr. Lovena Le felt medical therapy indicated. Nuclear study repeated April 2018 and showed ejection fraction 35% with no ischemia or infarction.   Last echocardiogram October 2018 showed ejection fraction 25-30% and mild mitral regurgitation.  Since I last saw her, the patient has dyspnea with more extreme activities but not with routine activities. It is relieved with rest. It is not associated with chest pain. There is no orthopnea, PND or pedal edema. There is no syncope or palpitations. There is no exertional chest pain.   Current Outpatient Medications  Medication Sig Dispense Refill  . albuterol (PROVENTIL HFA;VENTOLIN HFA) 108 (90 BASE) MCG/ACT inhaler Inhale 2 puffs into the lungs every 6 (six) hours as needed for wheezing or shortness of breath. 3 Inhaler 3  . ALPRAZolam (XANAX) 0.25 MG tablet Take 1 tablet (0.25 mg total) by mouth 2 (two) times daily as needed for anxiety. 60 tablet 2  . butalbital-acetaminophen-caffeine (FIORICET, ESGIC) 50-325-40 MG tablet TAKE 1 TABLET BY MOUTH TWICE A DAY AS NEEDED FOR HEADACHE 30 tablet 0  . carvedilol (COREG) 25 MG tablet Take 1 tablet (25 mg total) by mouth 2 (two) times daily. 180 tablet 2  . diclofenac (VOLTAREN) 75 MG EC tablet TAKE 1 TABLET BY MOUTH TWO  TIMES DAILY 180 tablet 1  . ENTRESTO 24-26 MG TAKE 1 TABLET BY MOUTH 2 (TWO) TIMES DAILY. 60 tablet 11  . estradiol (ESTRACE) 1 MG tablet TAKE 1 TABLET BY MOUTH EVERY DAY 90 tablet 2  . furosemide (LASIX) 40 MG tablet Take 2 tablets (80 mg total) by mouth 2 (two) times daily. 180 tablet 3  . gabapentin (NEURONTIN) 100 MG capsule Take 1 capsule (100 mg total) 3 (three) times daily by mouth. 90  capsule 3  . HYDROcodone-acetaminophen (NORCO/VICODIN) 5-325 MG tablet Take 1 tablet by mouth every 6 (six) hours as needed for moderate pain. 30 tablet 0  . ibuprofen (ADVIL,MOTRIN) 800 MG tablet TAKE 1 TABLET (800 MG TOTAL) EVERY 8 (EIGHT) HOURS AS NEEDED BY MOUTH. 40 tablet 2  . promethazine (PHENERGAN) 25 MG tablet Take 1 tablet (25 mg total) by mouth every 6 (six) hours as needed for nausea or vomiting. 40 tablet 1  . tiZANidine (ZANAFLEX) 4 MG tablet TAKE 1 TABLET BY MOUTH  EVERY 6 HOURS AS NEEDED. 180 tablet 1  . traMADol (ULTRAM) 50 MG tablet Take 1 tablet (50 mg total) every 6 (six) hours as needed by mouth. 60 tablet 2  . triamcinolone (NASACORT AQ) 55 MCG/ACT AERO nasal inhaler Place 2 sprays into the nose daily. 1 Inhaler 5  . Vitamin D, Ergocalciferol, (DRISDOL) 50000 units CAPS capsule Take 1 capsule (50,000 Units total) by mouth every 7 (seven) days. 12 capsule 0   No current facility-administered medications for this visit.      Past Medical History:  Diagnosis Date  . ALLERGIC RHINITIS 11/06/2007  . 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 ANEMIA-NOS 06/17/2008  . ANXIETY 02/22/2008  . CARDIOMYOPATHY, PRIMARY, DILATED 12/30/2008  . [place] DISEASE, 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  . GERD 05/07/2007  . HYPERLIPIDEMIA 05/23/2007  . HYPERTENSION 05/07/2007  . 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 MENOPAUSAL DISORDER 05/06/2008  . 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 OVERACTIVE BLADDER 08/03/2010  .  PYELONEPHRITIS, HX OF 05/07/2007  . SYSTOLIC HEART FAILURE, CHRONIC 12/28/2008  . VITAMIN D DEFICIENCY 06/17/2008    Past Surgical History:  Procedure Laterality Date  . ABDOMINAL HYSTERECTOMY    . LAMINECTOMY      Social History   Socioeconomic History  . Marital status: Widowed    Spouse name: Not on file  . Number of children: 3  . Years of education: Not on file  . Highest education level: Not on file  Occupational History  . Occupation: retired Film/video editor: RETIRED  Social Needs  . Financial resource strain: Not on file  . Food insecurity:    Worry: Not on file    Inability: Not on file  . Transportation needs:    Medical: Not on file    Non-medical: Not on file  Tobacco Use  . Smoking status: Never Smoker  . Smokeless tobacco: Never Used  Substance and Sexual Activity  . Alcohol use: No  . Drug use: No  . Sexual activity: Never  Lifestyle  . Physical activity:    Days per week: Not on file    Minutes per session: Not on file  . Stress: Not on file  Relationships  . Social connections:    Talks on phone: Not on file    Gets together: Not on file    Attends religious service: Not on file    Active member of club or organization: Not on file    Attends meetings of clubs or organizations: Not on file    Relationship status: Not on file  . Intimate partner violence:    Fear of current or ex partner: Not on file    Emotionally abused: Not on file    Physically abused: Not on file    Forced sexual activity: Not on file  Other Topics Concern  . Not on file  Social History Narrative  . Not on file    Family History  Problem Relation Age of Onset  . Arthritis Other   . Coronary artery disease Other   . Kidney disease Other   . Heart disease Mother     ROS: no fevers or chills, productive cough, hemoptysis, dysphasia, odynophagia, melena, hematochezia, dysuria, hematuria, rash, seizure activity, orthopnea, PND, pedal edema, claudication. Remaining systems are negative.  Physical Exam: Well-developed well-nourished in no acute distress.  Skin is warm and dry.  HEENT is normal.  Neck is supple.  Chest is clear to auscultation with normal expansion.  Cardiovascular exam is regular rate and rhythm.  Abdominal exam nontender or distended. No masses palpated. Extremities show no edema. neuro grossly intact  A/P  1 nonischemic cardiomyopathy-continue Entresto, spironolactone and beta-blocker.  2 chronic  systolic congestive heart failure-patient appears to be doing well from a volume standpoint.  Continue present dose of diuretic. Continue low-sodium diet and fluid restriction.  3 hypertension-blood pressure is controlled.  Continue present medications.  4 prior ICD-management per electrophysiology.  5 history of PVCs-continue beta-blocker.  Previously seen by Dr. Lovena Le and ablation felt not indicated.  6 hyperlipidemia-continue statin.  Kirk Ruths, MD

## 2017-10-03 NOTE — Progress Notes (Signed)
Subjective:    Patient ID: [name] [name], female    DOB: [birth year], 77 y.o.   MRN: [id number]  HPI  Here with acute onset mild to mod 2 wks ST, HA, general weakness and malaise, with prod cough greenish sputum, but Pt denies chest pain, increased sob or doe, wheezing, orthopnea, PND, increased LE swelling, palpitations, dizziness or syncope, except for onest mild wheezing and sob x 2 days.   Pt denies polydipsia, polyuria, no other new complaints Past Medical History:  Diagnosis Date  . ALLERGIC RHINITIS 11/06/2007  . 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 ANEMIA-NOS 06/17/2008  . ANXIETY 02/22/2008  . CARDIOMYOPATHY, PRIMARY, DILATED 12/30/2008  . [place] DISEASE, 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  . GERD 05/07/2007  . HYPERLIPIDEMIA 05/23/2007  . HYPERTENSION 05/07/2007  . 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 MENOPAUSAL DISORDER 05/06/2008  . 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 OVERACTIVE BLADDER 08/03/2010  . PYELONEPHRITIS, HX OF 05/07/2007  . SYSTOLIC HEART FAILURE, CHRONIC 12/28/2008  . VITAMIN D DEFICIENCY 06/17/2008   Past Surgical History:  Procedure Laterality Date  . ABDOMINAL HYSTERECTOMY    . LAMINECTOMY      reports that she has never smoked. She has never used smokeless tobacco. She reports that she does not drink alcohol or use drugs. family history includes Arthritis in her other; Coronary artery disease in her other; Heart disease in her mother; Kidney disease in her other. Allergies  Allergen Reactions  . Zocor [Simvastatin - High Dose] Nausea Only    Nausea    Current Outpatient Medications on File Prior to Visit  Medication Sig Dispense Refill  . albuterol (PROVENTIL HFA;VENTOLIN HFA) 108 (90 BASE) MCG/ACT inhaler Inhale 2 puffs into the lungs every 6 (six) hours as needed for wheezing or shortness of breath. 3 Inhaler 3  . ALPRAZolam (XANAX) 0.25 MG tablet Take 1 tablet (0.25 mg total) by mouth 2 (two) times daily as needed for anxiety. 60 tablet 2  .  butalbital-acetaminophen-caffeine (FIORICET, ESGIC) 50-325-40 MG tablet TAKE 1 TABLET BY MOUTH TWICE A DAY AS NEEDED FOR HEADACHE 30 tablet 0  . carvedilol (COREG) 12.5 MG tablet TAKE 1 TABLET BY MOUTH TWICE A DAY 180 tablet 3  . diclofenac (VOLTAREN) 75 MG EC tablet TAKE 1 TABLET BY MOUTH TWO  TIMES DAILY 180 tablet 1  . ENTRESTO 24-26 MG TAKE 1 TABLET BY MOUTH 2 (TWO) TIMES DAILY. 60 tablet 11  . esomeprazole (NEXIUM) 40 MG capsule Take 40 mg by mouth daily at 12 noon.    [name] estradiol (ESTRACE) 1 MG tablet Take 1 tablet (1 mg total) by mouth daily. 90 tablet 1  . furosemide (LASIX) 40 MG tablet Take 2 tablets (80 mg total) by mouth 2 (two) times daily. 180 tablet 3  . gabapentin (NEURONTIN) 100 MG capsule Take 1 capsule (100 mg total) 3 (three) times daily by mouth. 90 capsule 3  . HYDROcodone-acetaminophen (NORCO/VICODIN) 5-325 MG tablet Take 1 tablet by mouth every 6 (six) hours as needed for moderate pain. 30 tablet 0  . ibuprofen (ADVIL,MOTRIN) 800 MG tablet TAKE 1 TABLET (800 MG TOTAL) EVERY 8 (EIGHT) HOURS AS NEEDED BY MOUTH. 40 tablet 2  . promethazine (PHENERGAN) 25 MG tablet Take 1 tablet (25 mg total) by mouth every 6 (six) hours as needed for nausea or vomiting. 40 tablet 1  . spironolactone (ALDACTONE) 25 MG tablet TAKE 1 TABLET BY MOUTH  DAILY. 90 tablet 3  . tiZANidine (ZANAFLEX) 4 MG tablet TAKE 1 TABLET BY  MOUTH  EVERY 6 HOURS AS NEEDED. 180 tablet 1  . traMADol (ULTRAM) 50 MG tablet Take 1 tablet (50 mg total) every 6 (six) hours as needed by mouth. 60 tablet 2  . triamcinolone (NASACORT AQ) 55 MCG/ACT AERO nasal inhaler Place 2 sprays into the nose daily. 1 Inhaler 5  . Vitamin D, Ergocalciferol, (DRISDOL) 50000 units CAPS capsule Take 1 capsule (50,000 Units total) by mouth every 7 (seven) days. 12 capsule 0   No current facility-administered medications on file prior to visit.     Review of Systems  Constitutional: Negative for other unusual diaphoresis or sweats HENT:  Negative for ear discharge or swelling Eyes: Negative for other worsening visual disturbances Respiratory: Negative for stridor or other swelling  Gastrointestinal: Negative for worsening distension or other blood Genitourinary: Negative for retention or other urinary change Musculoskeletal: Negative for other MSK pain or swelling Skin: Negative for color change or other new lesions Neurological: Negative for worsening tremors and other numbness  Psychiatric/Behavioral: Negative for worsening agitation or other fatigue All other system neg per pt    Objective:   Physical Exam BP 114/72   Pulse 84   Temp 98.4 F (36.9 C) (Oral)   Ht 5\' 7"  (1.702 m)   Wt 218 lb (98.9 kg)   SpO2 96%   BMI 34.14 kg/m  VS noted, mild ill Constitutional: Pt appears in NAD HENT: Head: NCAT.  Right Ear: External ear normal.  Left Ear: External ear normal.  Bilat tm's with mild erythema.  Max sinus areas non tender.  Pharynx with mild erythema, no exudate Eyes: . Pupils are equal, round, and reactive to light. Conjunctivae and EOM are normal Nose: without d/c or deformity Neck: Neck supple. Gross normal ROM Cardiovascular: Normal rate and regular rhythm.   Pulmonary/Chest: Effort normal and breath sounds decreased without rales ut with few scattered mild bilat wheezing.  Neurological: Pt is alert. At baseline orientation, motor grossly intact Skin: Skin is warm. No rashes, other new lesions, no LE edema Psychiatric: Pt behavior is normal without agitation  No other exam findings Lab Results  Component Value Date   WBC 8.0 05/16/2017   HGB 11.3 (L) 05/16/2017   HCT 35.7 (L) 05/16/2017   PLT 304.0 05/16/2017   GLUCOSE 93 05/19/2017   CHOL 202 (H) 05/16/2017   TRIG 124.0 05/16/2017   HDL 52.80 05/16/2017   LDLDIRECT 129.7 02/07/2013   LDLCALC 124 (H) 05/16/2017   ALT 9 05/16/2017   AST 11 05/16/2017   NA 140 05/19/2017   K 5.4 (H) 05/19/2017   CL 101 05/19/2017   CREATININE 1.34 (H)  05/19/2017   BUN 21 05/19/2017   CO2 25 05/19/2017   TSH 0.79 05/16/2017   INR 0.9 RATIO 06/18/2007   HGBA1C 5.9 11/06/2012       Assessment & Plan:

## 2017-10-03 NOTE — Assessment & Plan Note (Signed)
Mild, declines inhaler but for low dose prednisone asd

## 2017-10-10 ENCOUNTER — Telehealth: Payer: Self-pay

## 2017-10-10 NOTE — Telephone Encounter (Signed)
Spoke with pt regarding apt tomorrow with AS pt wanted to cancel apt d/t her having another procedure scheduled for tomorrow. Explained to pt that her device reached elective replacement interval on Jan. 6, 2019 and that we had called her and made her aware. Explained to pt that she needed to keep this apt because after October 14 2017 [employer] cannot guarantee that patient's device will treat her if she was to have a lethal heart rhythm, pt stated that if she doesn't have this procedure tomorrow she cant get it done for 3 months. Emphasized to pt again that she needed to get seen as soon as possible in office because the sooner the OV is done the sooner generator change can be done. Informed pt that there were other EP's that could do the gen change since Dr. Caryl Comes is on vacation. Pt voiced understanding of the risk if she does not get her device changed out soon. Pt stated that she would keep the apt with AS and call and try to re-schedule the other procedure.

## 2017-10-11 ENCOUNTER — Telehealth: Payer: Self-pay

## 2017-10-11 ENCOUNTER — Encounter: Payer: Medicare Other | Admitting: Nurse Practitioner

## 2017-10-11 ENCOUNTER — Encounter: Payer: Self-pay | Admitting: *Deleted

## 2017-10-11 ENCOUNTER — Encounter: Payer: Self-pay | Admitting: Nurse Practitioner

## 2017-10-11 ENCOUNTER — Ambulatory Visit: Payer: Medicare Other | Admitting: Nurse Practitioner

## 2017-10-11 ENCOUNTER — Other Ambulatory Visit: Payer: Medicare Other

## 2017-10-11 VITALS — BP 120/70 | HR 91 | Ht 67.0 in | Wt 219.0 lb

## 2017-10-11 DIAGNOSIS — I5022 Chronic systolic (congestive) heart failure: Secondary | ICD-10-CM

## 2017-10-11 DIAGNOSIS — I493 Ventricular premature depolarization: Secondary | ICD-10-CM

## 2017-10-11 DIAGNOSIS — I10 Essential (primary) hypertension: Secondary | ICD-10-CM

## 2017-10-11 NOTE — Patient Instructions (Addendum)
Medication Instructions:   Your physician recommends that you continue on your current medications as directed. Please refer to the Current Medication list given to you today.   If you need a refill on your cardiac medications before your next appointment, please call your pharmacy.  Labwork:  CBC BMET MAG  TODAY    Testing/Procedures:  NONE ORDERED  TODAY    Follow-Up:  AFTER  10-27-17   14 DAYS WOUND CHECK DEVICE CLINIC     90 DAY PHYS DEFIB  WITH DR Caryl Comes    Any Other Special Instructions Will Be Listed Below (If Applicable).

## 2017-10-11 NOTE — Telephone Encounter (Signed)
Spoke with patient and she was agreeable to coming in at 0700 for a 0900 case on 4/19 for her change out.

## 2017-10-11 NOTE — H&P (View-Only) (Signed)
Electrophysiology Office Note Date: 10/12/2017  ID:  [name] [name], [name] [name] 1940/09/21, MRN [id number]  PCP: Biagio Borg, MD Primary Cardiologist: Stanford Breed Electrophysiologist: Caryl Comes  CC: ICD at [facility] is a 77 y.o. female seen today for Dr Caryl Comes.  She presents today for routine electrophysiology followup.  Since last being seen in our clinic, the patient reports doing relatively well. She denies chest pain, palpitations, dyspnea, PND, orthopnea, nausea, vomiting, dizziness, syncope, edema, weight gain, or early satiety.  She has not had ICD shocks.   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 ALLERGIC RHINITIS 11/06/2007  . 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 ANEMIA-NOS 06/17/2008  . ANXIETY 02/22/2008  . CARDIOMYOPATHY, PRIMARY, DILATED 12/30/2008  . [place] DISEASE, 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  . GERD 05/07/2007  . HYPERLIPIDEMIA 05/23/2007  . HYPERTENSION 05/07/2007  . 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 MENOPAUSAL DISORDER 05/06/2008  . 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 OVERACTIVE BLADDER 08/03/2010  . PYELONEPHRITIS, HX OF 05/07/2007  . SYSTOLIC HEART FAILURE, CHRONIC 12/28/2008  . VITAMIN D DEFICIENCY 06/17/2008   Past Surgical History:  Procedure Laterality Date  . ABDOMINAL HYSTERECTOMY    . LAMINECTOMY      Current Outpatient Medications  Medication Sig Dispense Refill  . albuterol (PROVENTIL HFA;VENTOLIN HFA) 108 (90 BASE) MCG/ACT inhaler Inhale 2 puffs into the lungs every 6 (six) hours as needed for wheezing or shortness of breath. 3 Inhaler 3  . ALPRAZolam (XANAX) 0.25 MG tablet Take 1 tablet (0.25 mg total) by mouth 2 (two) times daily as needed for anxiety. 60 tablet 2  . azithromycin (ZITHROMAX Z-PAK) 250 MG tablet 2 tab by mouth day 1, then 1 per day 6 tablet 1  . butalbital-acetaminophen-caffeine  (FIORICET, ESGIC) 50-325-40 MG tablet TAKE 1 TABLET BY MOUTH TWICE A DAY AS NEEDED FOR HEADACHE 30 tablet 0  . carvedilol (COREG) 12.5 MG tablet TAKE 1 TABLET BY MOUTH TWICE A DAY 180 tablet 3  . diclofenac (VOLTAREN) 75 MG EC tablet TAKE 1 TABLET BY MOUTH TWO  TIMES DAILY 180 tablet 1  . ENTRESTO 24-26 MG TAKE 1 TABLET BY MOUTH 2 (TWO) TIMES DAILY. 60 tablet 11  . esomeprazole (NEXIUM) 40 MG capsule Take 40 mg by mouth daily at 12 noon.    [name] estradiol (ESTRACE) 1 MG tablet Take 1 tablet (1 mg total) by mouth daily. 90 tablet 1  . furosemide (LASIX) 40 MG tablet Take 2 tablets (80 mg total) by mouth 2 (two) times daily. 180 tablet 3  . gabapentin (NEURONTIN) 100 MG capsule Take 1 capsule (100 mg total) 3 (three) times daily by mouth. 90 capsule 3  . HYDROcodone-acetaminophen (NORCO/VICODIN) 5-325 MG tablet Take 1 tablet by mouth every 6 (six) hours as needed for moderate pain. 30 tablet 0  . HYDROcodone-homatropine (HYCODAN) 5-1.5 MG/5ML syrup Take 5 mLs by mouth every 6 (six) hours as needed for up to 10 days for cough. 180 mL 0  . ibuprofen (ADVIL,MOTRIN) 800 MG tablet TAKE 1 TABLET (800 MG TOTAL) EVERY 8 (EIGHT) HOURS AS NEEDED BY MOUTH. 40 tablet 2  . predniSONE (DELTASONE) 10 MG tablet 2 tab by mouth x 5 days 10 tablet 0  . promethazine (PHENERGAN) 25 MG tablet Take 1 tablet (25 mg total) by mouth every 6 (six) hours as needed for nausea or vomiting. 40 tablet  1  . spironolactone (ALDACTONE) 25 MG tablet TAKE 1 TABLET BY MOUTH  DAILY. 90 tablet 3  . tiZANidine (ZANAFLEX) 4 MG tablet TAKE 1 TABLET BY MOUTH  EVERY 6 HOURS AS NEEDED. 180 tablet 1  . traMADol (ULTRAM) 50 MG tablet Take 1 tablet (50 mg total) every 6 (six) hours as needed by mouth. 60 tablet 2  . triamcinolone (NASACORT AQ) 55 MCG/ACT AERO nasal inhaler Place 2 sprays into the nose daily. 1 Inhaler 5  . Vitamin D, Ergocalciferol, (DRISDOL) 50000 units CAPS capsule Take 1 capsule (50,000 Units total) by mouth every 7 (seven) days. 12  capsule 0   No current facility-administered medications for this visit.     Allergies:   Zocor [simvastatin - high dose]   Social History: Social History   Socioeconomic History  . Marital status: Widowed    Spouse name: Not on file  . Number of children: 3  . Years of education: Not on file  . Highest education level: Not on file  Occupational History  . Occupation: retired Ambulance person: RETIRED  Social Needs  . Financial resource strain: Not on file  . Food insecurity:    Worry: Not on file    Inability: Not on file  . Transportation needs:    Medical: Not on file    Non-medical: Not on file  Tobacco Use  . Smoking status: Never Smoker  . Smokeless tobacco: Never Used  Substance and Sexual Activity  . Alcohol use: No  . Drug use: No  . Sexual activity: Never  Lifestyle  . Physical activity:    Days per week: Not on file    Minutes per session: Not on file  . Stress: Not on file  Relationships  . Social connections:    Talks on phone: Not on file    Gets together: Not on file    Attends religious service: Not on file    Active member of club or organization: Not on file    Attends meetings of clubs or organizations: Not on file    Relationship status: Not on file  . Intimate partner violence:    Fear of current or ex partner: Not on file    Emotionally abused: Not on file    Physically abused: Not on file    Forced sexual activity: Not on file  Other Topics Concern  . Not on file  Social History Narrative  . Not on file    Family History: Family History  Problem Relation Age of Onset  . Arthritis Other   . Coronary artery disease Other   . Kidney disease Other   . Heart disease Mother     Review of Systems: All other systems reviewed and are otherwise negative except as noted above.   Physical Exam: VS:  BP 120/70   Pulse 91   Ht 5\' 7"  (1.702 m)   Wt 219 lb (99.3 kg)   BMI 34.30 kg/m  , BMI Body mass index is 34.3  kg/m.  GEN- The patient is well appearing, alert and oriented x 3 today.   HEENT: normocephalic, atraumatic; sclera clear, conjunctiva pink; hearing intact; oropharynx clear; neck supple  Lungs- Clear to ausculation bilaterally, normal work of breathing.  No wheezes, rales, rhonchi Heart- Regular rate and rhythm,frequent ectopy  GI- soft, non-tender, non-distended, bowel sounds present, no hepatosplenomegaly Extremities- no clubbing, cyanosis, or edema  MS- no significant deformity or atrophy Skin- warm and dry, no rash or  lesion; ICD pocket well healed Psych- euthymic mood, full affect Neuro- strength and sensation are intact  ICD interrogation- reviewed in detail today,  See PACEART report  EKG:  EKG is ordered today. The ekg ordered today shows sinus rhythm, frequent ventricular ectopy  Recent Labs: 05/16/2017: ALT 9; TSH 0.79 05/19/2017: BNP 366.8 10/11/2017: BUN 17; Creatinine, Ser 1.38; Hemoglobin 10.8; Magnesium 2.2; Platelets 226; Potassium 4.4; Sodium 140   Wt Readings from Last 3 Encounters:  10/11/17 219 lb (99.3 kg)  10/03/17 218 lb (98.9 kg)  07/10/17 224 lb (101.6 kg)     Other studies Reviewed: Additional studies/ records that were reviewed today include: Dr Caryl Comes and Dr Jacalyn Lefevre office notes   Assessment and Plan:  1.  Chronic systolic dysfunction euvolemic today Stable on an appropriate medical regimen ICD at [facility].  Risks, benefits to gen change reviewed with the patient who wishes to proceed. Will plan at next available time with Dr Caryl Comes  2.  HTN Stable No change required today  3.  Frequent ventricular ectopy Increase Coreg to 25mg  twice daily Mg today with pre-procedure labs    Current medicines are reviewed at length with the patient today.   The patient does not have concerns regarding her medicines.  The following changes were made today:  none  Labs/ tests ordered today include: pre-procedure labs Orders Placed This Encounter  Procedures  .  Basic metabolic panel  . CBC  . Magnesium  . EKG 12-Lead     Disposition:   Follow up with Dr Caryl Comes after gen change      Signed, Chanetta Marshall, NP 10/12/2017 8:33 AM  [place] [address] [phone] (office) [phone] (fax)

## 2017-10-11 NOTE — Progress Notes (Signed)
Electrophysiology Office Note Date: 10/12/2017  ID:  [name] [name], [name] [name] 06-16-1941, MRN [id number]  PCP: Biagio Borg, MD Primary Cardiologist: Stanford Breed Electrophysiologist: Caryl Comes  CC: ICD at [facility] is a 77 y.o. female seen today for Dr Caryl Comes.  She presents today for routine electrophysiology followup.  Since last being seen in our clinic, the patient reports doing relatively well. She denies chest pain, palpitations, dyspnea, PND, orthopnea, nausea, vomiting, dizziness, syncope, edema, weight gain, or early satiety.  She has not had ICD shocks.   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 ALLERGIC RHINITIS 11/06/2007  . 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 ANEMIA-NOS 06/17/2008  . ANXIETY 02/22/2008  . CARDIOMYOPATHY, PRIMARY, DILATED 12/30/2008  . [place] DISEASE, 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  . GERD 05/07/2007  . HYPERLIPIDEMIA 05/23/2007  . HYPERTENSION 05/07/2007  . 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 MENOPAUSAL DISORDER 05/06/2008  . 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 OVERACTIVE BLADDER 08/03/2010  . PYELONEPHRITIS, HX OF 05/07/2007  . SYSTOLIC HEART FAILURE, CHRONIC 12/28/2008  . VITAMIN D DEFICIENCY 06/17/2008   Past Surgical History:  Procedure Laterality Date  . ABDOMINAL HYSTERECTOMY    . LAMINECTOMY      Current Outpatient Medications  Medication Sig Dispense Refill  . albuterol (PROVENTIL HFA;VENTOLIN HFA) 108 (90 BASE) MCG/ACT inhaler Inhale 2 puffs into the lungs every 6 (six) hours as needed for wheezing or shortness of breath. 3 Inhaler 3  . ALPRAZolam (XANAX) 0.25 MG tablet Take 1 tablet (0.25 mg total) by mouth 2 (two) times daily as needed for anxiety. 60 tablet 2  . azithromycin (ZITHROMAX Z-PAK) 250 MG tablet 2 tab by mouth day 1, then 1 per day 6 tablet 1  . butalbital-acetaminophen-caffeine  (FIORICET, ESGIC) 50-325-40 MG tablet TAKE 1 TABLET BY MOUTH TWICE A DAY AS NEEDED FOR HEADACHE 30 tablet 0  . carvedilol (COREG) 12.5 MG tablet TAKE 1 TABLET BY MOUTH TWICE A DAY 180 tablet 3  . diclofenac (VOLTAREN) 75 MG EC tablet TAKE 1 TABLET BY MOUTH TWO  TIMES DAILY 180 tablet 1  . ENTRESTO 24-26 MG TAKE 1 TABLET BY MOUTH 2 (TWO) TIMES DAILY. 60 tablet 11  . esomeprazole (NEXIUM) 40 MG capsule Take 40 mg by mouth daily at 12 noon.    [name] estradiol (ESTRACE) 1 MG tablet Take 1 tablet (1 mg total) by mouth daily. 90 tablet 1  . furosemide (LASIX) 40 MG tablet Take 2 tablets (80 mg total) by mouth 2 (two) times daily. 180 tablet 3  . gabapentin (NEURONTIN) 100 MG capsule Take 1 capsule (100 mg total) 3 (three) times daily by mouth. 90 capsule 3  . HYDROcodone-acetaminophen (NORCO/VICODIN) 5-325 MG tablet Take 1 tablet by mouth every 6 (six) hours as needed for moderate pain. 30 tablet 0  . HYDROcodone-homatropine (HYCODAN) 5-1.5 MG/5ML syrup Take 5 mLs by mouth every 6 (six) hours as needed for up to 10 days for cough. 180 mL 0  . ibuprofen (ADVIL,MOTRIN) 800 MG tablet TAKE 1 TABLET (800 MG TOTAL) EVERY 8 (EIGHT) HOURS AS NEEDED BY MOUTH. 40 tablet 2  . predniSONE (DELTASONE) 10 MG tablet 2 tab by mouth x 5 days 10 tablet 0  . promethazine (PHENERGAN) 25 MG tablet Take 1 tablet (25 mg total) by mouth every 6 (six) hours as needed for nausea or vomiting. 40 tablet  1  . spironolactone (ALDACTONE) 25 MG tablet TAKE 1 TABLET BY MOUTH  DAILY. 90 tablet 3  . tiZANidine (ZANAFLEX) 4 MG tablet TAKE 1 TABLET BY MOUTH  EVERY 6 HOURS AS NEEDED. 180 tablet 1  . traMADol (ULTRAM) 50 MG tablet Take 1 tablet (50 mg total) every 6 (six) hours as needed by mouth. 60 tablet 2  . triamcinolone (NASACORT AQ) 55 MCG/ACT AERO nasal inhaler Place 2 sprays into the nose daily. 1 Inhaler 5  . Vitamin D, Ergocalciferol, (DRISDOL) 50000 units CAPS capsule Take 1 capsule (50,000 Units total) by mouth every 7 (seven) days. 12  capsule 0   No current facility-administered medications for this visit.     Allergies:   Zocor [simvastatin - high dose]   Social History: Social History   Socioeconomic History  . Marital status: Widowed    Spouse name: Not on file  . Number of children: 3  . Years of education: Not on file  . Highest education level: Not on file  Occupational History  . Occupation: retired Ambulance person: RETIRED  Social Needs  . Financial resource strain: Not on file  . Food insecurity:    Worry: Not on file    Inability: Not on file  . Transportation needs:    Medical: Not on file    Non-medical: Not on file  Tobacco Use  . Smoking status: Never Smoker  . Smokeless tobacco: Never Used  Substance and Sexual Activity  . Alcohol use: No  . Drug use: No  . Sexual activity: Never  Lifestyle  . Physical activity:    Days per week: Not on file    Minutes per session: Not on file  . Stress: Not on file  Relationships  . Social connections:    Talks on phone: Not on file    Gets together: Not on file    Attends religious service: Not on file    Active member of club or organization: Not on file    Attends meetings of clubs or organizations: Not on file    Relationship status: Not on file  . Intimate partner violence:    Fear of current or ex partner: Not on file    Emotionally abused: Not on file    Physically abused: Not on file    Forced sexual activity: Not on file  Other Topics Concern  . Not on file  Social History Narrative  . Not on file    Family History: Family History  Problem Relation Age of Onset  . Arthritis Other   . Coronary artery disease Other   . Kidney disease Other   . Heart disease Mother     Review of Systems: All other systems reviewed and are otherwise negative except as noted above.   Physical Exam: VS:  BP 120/70   Pulse 91   Ht 5\' 7"  (1.702 m)   Wt 219 lb (99.3 kg)   BMI 34.30 kg/m  , BMI Body mass index is 34.3  kg/m.  GEN- The patient is well appearing, alert and oriented x 3 today.   HEENT: normocephalic, atraumatic; sclera clear, conjunctiva pink; hearing intact; oropharynx clear; neck supple  Lungs- Clear to ausculation bilaterally, normal work of breathing.  No wheezes, rales, rhonchi Heart- Regular rate and rhythm,frequent ectopy  GI- soft, non-tender, non-distended, bowel sounds present, no hepatosplenomegaly Extremities- no clubbing, cyanosis, or edema  MS- no significant deformity or atrophy Skin- warm and dry, no rash or  lesion; ICD pocket well healed Psych- euthymic mood, full affect Neuro- strength and sensation are intact  ICD interrogation- reviewed in detail today,  See PACEART report  EKG:  EKG is ordered today. The ekg ordered today shows sinus rhythm, frequent ventricular ectopy  Recent Labs: 05/16/2017: ALT 9; TSH 0.79 05/19/2017: BNP 366.8 10/11/2017: BUN 17; Creatinine, Ser 1.38; Hemoglobin 10.8; Magnesium 2.2; Platelets 226; Potassium 4.4; Sodium 140   Wt Readings from Last 3 Encounters:  10/11/17 219 lb (99.3 kg)  10/03/17 218 lb (98.9 kg)  07/10/17 224 lb (101.6 kg)     Other studies Reviewed: Additional studies/ records that were reviewed today include: Dr Caryl Comes and Dr Jacalyn Lefevre office notes   Assessment and Plan:  1.  Chronic systolic dysfunction euvolemic today Stable on an appropriate medical regimen ICD at [facility].  Risks, benefits to gen change reviewed with the patient who wishes to proceed. Will plan at next available time with Dr Caryl Comes  2.  HTN Stable No change required today  3.  Frequent ventricular ectopy Increase Coreg to 25mg  twice daily Mg today with pre-procedure labs    Current medicines are reviewed at length with the patient today.   The patient does not have concerns regarding her medicines.  The following changes were made today:  none  Labs/ tests ordered today include: pre-procedure labs Orders Placed This Encounter  Procedures  .  Basic metabolic panel  . CBC  . Magnesium  . EKG 12-Lead     Disposition:   Follow up with Dr Caryl Comes after gen change      Signed, Chanetta Marshall, NP 10/12/2017 8:33 AM  [place] [address] [phone] (office) [phone] (fax)

## 2017-10-12 ENCOUNTER — Telehealth: Payer: Self-pay | Admitting: *Deleted

## 2017-10-12 LAB — CBC
HEMATOCRIT: 33.9 % — AB (ref 34.0–46.6)
Hemoglobin: 10.8 g/dL — ABNORMAL LOW (ref 11.1–15.9)
MCH: 25.2 pg — AB (ref 26.6–33.0)
MCHC: 31.9 g/dL (ref 31.5–35.7)
MCV: 79 fL (ref 79–97)
Platelets: 226 10*3/uL (ref 150–379)
RBC: 4.29 x10E6/uL (ref 3.77–5.28)
RDW: 16.7 % — ABNORMAL HIGH (ref 12.3–15.4)
WBC: 10.7 10*3/uL (ref 3.4–10.8)

## 2017-10-12 LAB — BASIC METABOLIC PANEL
BUN/Creatinine Ratio: 12 (ref 12–28)
BUN: 17 mg/dL (ref 8–27)
CALCIUM: 9.3 mg/dL (ref 8.7–10.3)
CO2: 23 mmol/L (ref 20–29)
CREATININE: 1.38 mg/dL — AB (ref 0.57–1.00)
Chloride: 102 mmol/L (ref 96–106)
GFR, EST AFRICAN AMERICAN: 43 mL/min/{1.73_m2} — AB (ref >59)
GFR, EST NON AFRICAN AMERICAN: 38 mL/min/{1.73_m2} — AB (ref >59)
Glucose: 85 mg/dL (ref 65–99)
Potassium: 4.4 mmol/L (ref 3.5–5.2)
Sodium: 140 mmol/L (ref 134–144)

## 2017-10-12 LAB — CUP PACEART INCLINIC DEVICE CHECK
Implantable Lead Model: 6947
Implantable Pulse Generator Implant Date: 20081215
MDC IDC LEAD IMPLANT DT: 20081215
MDC IDC LEAD LOCATION: 753860
MDC IDC SESS DTM: 20190404091627

## 2017-10-12 LAB — MAGNESIUM: Magnesium: 2.2 mg/dL (ref 1.6–2.3)

## 2017-10-12 MED ORDER — CARVEDILOL 25 MG PO TABS: 25.0000 mg | ORAL_TABLET | Freq: Two times a day (BID) | ORAL | 2 refills | Status: DC

## 2017-10-12 NOTE — Telephone Encounter (Signed)
-----   Message from Patsey Berthold, NP sent at 10/12/2017  6:08 AM EDT ----- Please notify patient of stable labs.  Please be sure she got the message to increase Coreg to 25mg  twice daily. Thank you!

## 2017-10-12 NOTE — Telephone Encounter (Signed)
Informed patient of results and verbal understanding expressed. Pt states that she started the increased Coreg dosage this morning. Will update pts medication list to reflect the medication change.   Informed that new Rx will be for 25 mg tablets. Patient verbalized understanding and agreeable to plan.

## 2017-10-15 ENCOUNTER — Other Ambulatory Visit: Payer: Self-pay | Admitting: Internal Medicine

## 2017-10-16 ENCOUNTER — Ambulatory Visit: Payer: Medicare Other | Admitting: Cardiology

## 2017-10-16 ENCOUNTER — Encounter: Payer: Self-pay | Admitting: Cardiology

## 2017-10-16 VITALS — BP 100/62 | HR 79 | Ht 67.0 in | Wt 219.0 lb

## 2017-10-16 DIAGNOSIS — E7800 Pure hypercholesterolemia, unspecified: Secondary | ICD-10-CM | POA: Diagnosis not present

## 2017-10-16 DIAGNOSIS — I10 Essential (primary) hypertension: Secondary | ICD-10-CM | POA: Diagnosis not present

## 2017-10-16 DIAGNOSIS — I428 Other cardiomyopathies: Secondary | ICD-10-CM | POA: Diagnosis not present

## 2017-10-16 DIAGNOSIS — I5022 Chronic systolic (congestive) heart failure: Secondary | ICD-10-CM

## 2017-10-16 NOTE — Patient Instructions (Signed)
Your physician wants you to follow-up in: 6 MONTHS WITH DR CRENSHAW You will receive a reminder letter in the mail two months in advance. If you don't receive a letter, please call our office to schedule the follow-up appointment.   If you need a refill on your cardiac medications before your next appointment, please call your pharmacy.  

## 2017-10-19 ENCOUNTER — Telehealth: Payer: Self-pay | Admitting: *Deleted

## 2017-10-19 ENCOUNTER — Telehealth: Payer: Self-pay | Admitting: Nurse Practitioner

## 2017-10-19 NOTE — Telephone Encounter (Signed)
PT CONCERNS ARE THAT SHE NEEDS ASSISTANCE WITH  HER  THAT DAY  FOR HER DEFIB INSERTION ON 10-27-17.  SHE MENTIONS THAT HER INSURANCE COMPANY WILL   COVER THESE SERVICES BUT HER PROVIDER NEEDS TO PROVIDE INFORMATION STATING SHE  CAN HAVE  ASSISTANCE WITH HER THAT DAY.  SHE STATED THERE IS A CONTACT NUMBER  WHERE THE EXACT INFORMATION CAN BE FOUND OUT THAT IS NEEDED.WHICH SHE HAS TO CALL BACK  IN AM AND GIVE  BECAUSE SHE IS OUT AND ABOUT.  PT WAS TOLD ONCE SHE CONTACT CLINIC BACK WITH INFORMATION FURTHER STEPS CAN BE TAKEN TO FIND OUT APPROPRIATE INFORMATION NEEDED.

## 2017-10-19 NOTE — Telephone Encounter (Signed)
New message:     Pt states she has some important questions that she needs to ask. Pt states she is having surgery on next Friday.

## 2017-10-20 NOTE — Telephone Encounter (Signed)
Follow up   Patient calling to request a call be made to [facility] phone [phone] to set up assistance after surgery

## 2017-10-20 NOTE — Telephone Encounter (Signed)
LMOVM FOR PT TO CALL CLINIC BACK  DUE TO HER SSI# IS BEING REQUESTED TO GET THE INFORMATION. FOR PAPER WORK NEEDED.  PT GAVE HER MEMBER ID NUMBER BUT IT IS NIOT THE CORRECT INFORMATION NEEDED.  [employer] [phone] NUMBER GIVE TO CALL FOR INFORMATION.

## 2017-10-23 ENCOUNTER — Telehealth: Payer: Self-pay | Admitting: Internal Medicine

## 2017-10-23 NOTE — Telephone Encounter (Signed)
New Message   Patient is calling with additional question about her upcoming procedure. Please call to discuss.

## 2017-10-23 NOTE — Telephone Encounter (Signed)
Pt device change out moved up to [facility] April 17 with arrival to [facility] at 0830. Pt understands all pre procedure instructions will stay the same.

## 2017-10-24 ENCOUNTER — Telehealth: Payer: Self-pay

## 2017-10-24 NOTE — Telephone Encounter (Signed)
Pt informed her case time has changed to 12:00pm arrival on 4/17 for an ICD change out. Verbalized understanding.

## 2017-10-25 ENCOUNTER — Ambulatory Visit (HOSPITAL_COMMUNITY)
Admission: RE | Admit: 2017-10-25 | Discharge: 2017-10-25 | Disposition: A | Discharge status: Home / Self Care | Payer: Medicare Other | Source: Ambulatory Visit | Attending: Internal Medicine | Admitting: Internal Medicine

## 2017-10-25 ENCOUNTER — Encounter (HOSPITAL_COMMUNITY)
Admission: RE | Disposition: A | Discharge status: Home / Self Care | Payer: Self-pay | Source: Ambulatory Visit | Attending: Internal Medicine

## 2017-10-25 DIAGNOSIS — I420 Dilated cardiomyopathy: Secondary | ICD-10-CM | POA: Insufficient documentation

## 2017-10-25 DIAGNOSIS — Z4502 Encounter for adjustment and management of automatic implantable cardiac defibrillator: Secondary | ICD-10-CM

## 2017-10-25 DIAGNOSIS — Z7952 Long term (current) use of systemic steroids: Secondary | ICD-10-CM | POA: Diagnosis not present

## 2017-10-25 DIAGNOSIS — I110 Hypertensive heart disease with heart failure: Secondary | ICD-10-CM | POA: Diagnosis not present

## 2017-10-25 DIAGNOSIS — F419 Anxiety disorder, unspecified: Secondary | ICD-10-CM | POA: Diagnosis not present

## 2017-10-25 DIAGNOSIS — I428 Other cardiomyopathies: Secondary | ICD-10-CM

## 2017-10-25 DIAGNOSIS — I493 Ventricular premature depolarization: Secondary | ICD-10-CM | POA: Diagnosis present

## 2017-10-25 DIAGNOSIS — N3281 Overactive bladder: Secondary | ICD-10-CM | POA: Diagnosis not present

## 2017-10-25 DIAGNOSIS — E785 Hyperlipidemia, unspecified: Secondary | ICD-10-CM | POA: Diagnosis not present

## 2017-10-25 DIAGNOSIS — E559 Vitamin D deficiency, unspecified: Secondary | ICD-10-CM | POA: Diagnosis not present

## 2017-10-25 DIAGNOSIS — Z95810 Presence of automatic (implantable) cardiac defibrillator: Secondary | ICD-10-CM | POA: Diagnosis present

## 2017-10-25 DIAGNOSIS — K219 Gastro-esophageal reflux disease without esophagitis: Secondary | ICD-10-CM | POA: Insufficient documentation

## 2017-10-25 DIAGNOSIS — Z7951 Long term (current) use of inhaled steroids: Secondary | ICD-10-CM | POA: Insufficient documentation

## 2017-10-25 DIAGNOSIS — I5022 Chronic systolic (congestive) heart failure: Secondary | ICD-10-CM | POA: Diagnosis present

## 2017-10-25 HISTORY — PX: ICD GENERATOR CHANGEOUT: EP1231

## 2017-10-25 LAB — SURGICAL PCR SCREEN
MRSA, PCR: NEGATIVE
Staphylococcus aureus: NEGATIVE

## 2017-10-25 SURGERY — ICD GENERATOR CHANGEOUT

## 2017-10-25 MED ORDER — CHLORHEXIDINE GLUCONATE 4 % EX LIQD
60.0000 mL | Freq: Once | CUTANEOUS | Status: DC
  Filled 2017-10-25: qty 60

## 2017-10-25 MED ORDER — ONDANSETRON HCL 4 MG/2ML IJ SOLN: 4.0000 mg | Freq: Four times a day (QID) | INTRAMUSCULAR | Status: DC | PRN

## 2017-10-25 MED ORDER — SODIUM CHLORIDE 0.9 % IV SOLN
80.0000 mg | INTRAVENOUS | Status: AC
  Administered 2017-10-25: 80 mg

## 2017-10-25 MED ORDER — MIDAZOLAM HCL 5 MG/5ML IJ SOLN
INTRAMUSCULAR | Status: DC | PRN
  Administered 2017-10-25: 1 mg via INTRAVENOUS
  Administered 2017-10-25: 2 mg via INTRAVENOUS

## 2017-10-25 MED ORDER — LIDOCAINE HCL (PF) 1 % IJ SOLN
INTRAMUSCULAR | Status: AC
  Filled 2017-10-25: qty 30

## 2017-10-25 MED ORDER — FENTANYL CITRATE (PF) 100 MCG/2ML IJ SOLN
INTRAMUSCULAR | Status: DC | PRN
  Administered 2017-10-25: 25 ug via INTRAVENOUS
  Administered 2017-10-25: 50 ug via INTRAVENOUS

## 2017-10-25 MED ORDER — MUPIROCIN 2 % EX OINT
1.0000 "application " | TOPICAL_OINTMENT | Freq: Once | CUTANEOUS | Status: AC
  Administered 2017-10-25: 1 via TOPICAL
  Filled 2017-10-25: qty 22

## 2017-10-25 MED ORDER — LIDOCAINE HCL (PF) 1 % IJ SOLN
INTRAMUSCULAR | Status: DC | PRN
  Administered 2017-10-25: 60 mL

## 2017-10-25 MED ORDER — FENTANYL CITRATE (PF) 100 MCG/2ML IJ SOLN
INTRAMUSCULAR | Status: AC
  Filled 2017-10-25: qty 2

## 2017-10-25 MED ORDER — SODIUM CHLORIDE 0.9 % IV SOLN: INTRAVENOUS | Status: AC

## 2017-10-25 MED ORDER — CEFAZOLIN SODIUM-DEXTROSE 2-4 GM/100ML-% IV SOLN
INTRAVENOUS | Status: AC
  Filled 2017-10-25: qty 100

## 2017-10-25 MED ORDER — SODIUM CHLORIDE 0.9 % IV SOLN
INTRAVENOUS | Status: AC
  Filled 2017-10-25: qty 2

## 2017-10-25 MED ORDER — MIDAZOLAM HCL 5 MG/5ML IJ SOLN
INTRAMUSCULAR | Status: AC
  Filled 2017-10-25: qty 5

## 2017-10-25 MED ORDER — SODIUM CHLORIDE 0.9 % IV SOLN
INTRAVENOUS | Status: DC
  Administered 2017-10-25: 13:00:00 via INTRAVENOUS

## 2017-10-25 MED ORDER — ACETAMINOPHEN 325 MG PO TABS
325.0000 mg | ORAL_TABLET | ORAL | Status: DC | PRN
  Filled 2017-10-25: qty 2

## 2017-10-25 MED ORDER — MUPIROCIN 2 % EX OINT
TOPICAL_OINTMENT | CUTANEOUS | Status: AC
  Administered 2017-10-25: 1 via TOPICAL
  Filled 2017-10-25: qty 22

## 2017-10-25 MED ORDER — CEFAZOLIN SODIUM-DEXTROSE 2-4 GM/100ML-% IV SOLN
2.0000 g | INTRAVENOUS | Status: AC
  Administered 2017-10-25: 2 g via INTRAVENOUS

## 2017-10-25 SURGICAL SUPPLY — 5 items
CABLE SURGICAL S-101-97-12 (CABLE) ×2 IMPLANT
HEMOSTAT SURGICEL 2X4 FIBR (HEMOSTASIS) ×2 IMPLANT
ICD VISIA MRI DVFB1D1 (ICD Generator) ×2 IMPLANT
PAD DEFIB LIFELINK (PAD) ×2 IMPLANT
TRAY PACEMAKER INSERTION (PACKS) ×2 IMPLANT

## 2017-10-25 NOTE — Interval H&P Note (Signed)
History and Physical Interval Note:  10/25/2017 1:31 PM  [name] [name]  has presented today for surgery, with the diagnosis of eri  The various methods of treatment have been discussed with the patient and family. After consideration of risks, benefits and other options for treatment, the patient has consented to  Procedure(s): ICD [place] (N/A) as a surgical intervention .  The patient's history has been reviewed, patient examined, no change in status, stable for surgery.  I have reviewed the patient's chart and labs.  Questions were answered to the patient's satisfaction.     [name]

## 2017-10-25 NOTE — Interval H&P Note (Signed)
History and Physical Interval Note:  10/25/2017 2:36 PM  [name] [name]  has presented today for surgery, with the diagnosis of eri  The various methods of treatment have been discussed with the patient and family. After consideration of risks, benefits and other options for treatment, the patient has consented to  Procedure(s): ICD [place] (N/A) as a surgical intervention .  The patient's history has been reviewed, patient examined, no change in status, stable for surgery.  I have reviewed the patient's chart and labs.  Questions were answered to the patient's satisfaction.     [name]

## 2017-10-25 NOTE — Interval H&P Note (Signed)
ICD Criteria  Current LVEF:28%. Within 12 months prior to implant: Yes   Heart failure history: Yes, Class II  Cardiomyopathy history: Yes, Non-Ischemic Cardiomyopathy.  Atrial Fibrillation/Atrial Flutter: No.  Ventricular tachycardia history: No.  Cardiac arrest history: No.  History of syndromes with risk of sudden death: No.  Previous ICD: Yes, Reason for ICD:  Primary prevention.  Current ICD indication: Primary  PPM indication: No.   Class I or II Bradycardia indication present: No  Beta Blocker therapy for 3 or more months: Yes, prescribed.   Ace Inhibitor/ARB therapy for 3 or more months: Yes, prescribed.   History and Physical Interval Note:  10/25/2017 1:31 PM  [name] [name]  has presented today for surgery, with the diagnosis of eri  The various methods of treatment have been discussed with the patient and family. After consideration of risks, benefits and other options for treatment, the patient has consented to  Procedure(s): ICD [place] (N/A) as a surgical intervention .  The patient's history has been reviewed, patient examined, no change in status, stable for surgery.  I have reviewed the patient's chart and labs.  Questions were answered to the patient's satisfaction.     [name]

## 2017-10-25 NOTE — Interval H&P Note (Signed)
History and Physical Interval Note:  10/25/2017 2:35 PM  [name] [name]  has presented today for surgery, with the diagnosis of eri  The various methods of treatment have been discussed with the patient and family. After consideration of risks, benefits and other options for treatment, the patient has consented to  Procedure(s): ICD [place] (N/A) as a surgical intervention .  The patient's history has been reviewed, patient examined, no change in status, stable for surgery.  I have reviewed the patient's chart and labs.  Questions were answered to the patient's satisfaction.     [name]

## 2017-10-25 NOTE — Discharge Instructions (Signed)

## 2017-10-26 ENCOUNTER — Encounter (HOSPITAL_COMMUNITY): Payer: Self-pay | Admitting: Internal Medicine

## 2017-11-01 ENCOUNTER — Encounter: Payer: Medicare Other | Admitting: Internal Medicine

## 2017-11-09 ENCOUNTER — Ambulatory Visit (INDEPENDENT_AMBULATORY_CARE_PROVIDER_SITE_OTHER): Payer: Medicare Other | Admitting: *Deleted

## 2017-11-09 DIAGNOSIS — I428 Other cardiomyopathies: Secondary | ICD-10-CM | POA: Diagnosis not present

## 2017-11-09 DIAGNOSIS — Z95810 Presence of automatic (implantable) cardiac defibrillator: Secondary | ICD-10-CM

## 2017-11-09 LAB — CUP PACEART INCLINIC DEVICE CHECK
Battery Remaining Longevity: 133 mo
Battery Voltage: 3.06 V
HighPow Impedance: 35 Ohm
HighPow Impedance: 46 Ohm
Implantable Lead Implant Date: 20081215
Implantable Lead Location: 753860
Implantable Lead Model: 6947
Implantable Pulse Generator Implant Date: 20190418
Lead Channel Impedance Value: 456 Ohm
Lead Channel Pacing Threshold Pulse Width: 0.4 ms
Lead Channel Setting Pacing Amplitude: 2.5 V
Lead Channel Setting Pacing Pulse Width: 0.4 ms
MDC IDC MSMT LEADCHNL RV IMPEDANCE VALUE: 380 Ohm
MDC IDC MSMT LEADCHNL RV PACING THRESHOLD AMPLITUDE: 1 V
MDC IDC MSMT LEADCHNL RV SENSING INTR AMPL: 26 mV
MDC IDC SESS DTM: 20190502094918
MDC IDC SET LEADCHNL RV SENSING SENSITIVITY: 0.3 mV
MDC IDC STAT BRADY RV PERCENT PACED: 0.01 %

## 2017-11-09 NOTE — Progress Notes (Signed)
Wound check appointment. Steri-strips removed. Wound without redness or edema. Incision edges approximated, wound well healed. Normal device function. Thresholds, sensing, and impedances consistent with implant measurements. Device programmed at chronic output. Max RV pacing impedance increased to 2000ohms. Histogram distribution appropriate for patient and level of activity. 1 AF episode (<0.1%)--ECG appears SR w/ectopy. No ventricular arrhythmias noted. Patient educated about wound care, arm mobility, lifting restrictions, shock plan, and Carelink monitor. ROV with SK on 02/02/18.

## 2017-11-14 ENCOUNTER — Encounter: Payer: Self-pay | Admitting: Internal Medicine

## 2017-11-14 ENCOUNTER — Ambulatory Visit (INDEPENDENT_AMBULATORY_CARE_PROVIDER_SITE_OTHER): Payer: Medicare Other | Admitting: Internal Medicine

## 2017-11-14 VITALS — BP 116/78 | HR 75 | Temp 98.2°F | Ht 67.0 in | Wt 220.0 lb

## 2017-11-14 DIAGNOSIS — I10 Essential (primary) hypertension: Secondary | ICD-10-CM

## 2017-11-14 DIAGNOSIS — E7800 Pure hypercholesterolemia, unspecified: Secondary | ICD-10-CM | POA: Diagnosis not present

## 2017-11-14 DIAGNOSIS — M5416 Radiculopathy, lumbar region: Secondary | ICD-10-CM | POA: Diagnosis not present

## 2017-11-14 MED ORDER — GABAPENTIN 300 MG PO CAPS: 300.0000 mg | ORAL_CAPSULE | Freq: Three times a day (TID) | ORAL | 1 refills | Status: DC

## 2017-11-14 NOTE — Assessment & Plan Note (Signed)
Non surgical candidate as she declined in past, cannot have MRI, ok for increased gabapentin 300 tid.

## 2017-11-14 NOTE — Assessment & Plan Note (Signed)
stable overall by history and exam, recent data reviewed with pt, and pt to continue medical treatment as before,  to f/u any worsening symptoms or concerns BP Readings from Last 3 Encounters:  11/14/17 116/78  10/25/17 114/69  10/16/17 100/62

## 2017-11-14 NOTE — Patient Instructions (Addendum)
Ok to increase the gabapentin to 300 mg three times per day  Please continue all other medications as before, and refills have been done if requested.  Please have the pharmacy call with any other refills you may need.  Please continue your efforts at being more active, low cholesterol diet, and weight control.  Please keep your appointments with your specialists as you may have planned  Please return in 6 months, or sooner if needed, with Lab testing done 3-5 days before

## 2017-11-14 NOTE — Assessment & Plan Note (Signed)
decllines statin , for lower chol diet

## 2017-11-14 NOTE — Progress Notes (Signed)
Subjective:    Patient ID: [name] [name], female    DOB: Aug 22, 1940, 77 y.o.   MRN: [id number]  HPI   Here to f/u; overall doing ok,  Pt denies chest pain, increasing sob or doe, wheezing, orthopnea, PND, increased LE swelling, palpitations, dizziness or syncope.  Pt denies new neurological symptoms such as new headache, or facial or extremity weakness or numbness.  Pt denies polydipsia, polyuria, or low sugar episode.  Pt states overall good compliance with meds, mostly trying to follow appropriate diet, with wt overall stable,  but little exercise however due to persistent left lumbar radicular pain.     Pt continues to have recurring left LBP with radiation to LLE distal without weakness or numbness,, no bowel or bladder change, fever, wt loss, gait change or falls though has mild balance issue. Not MRi eligible due to ICD.  Has known lumbar DDD, has been seen and recommended for surgury per Dr Eddie Dibbles, but she was wary of the procedure and putting up with the pain.  Gabapentin 100 tid not working well.  Also S/p recent ICD implant. .  Declines statin consideration today, will work on lower chol diet. No other interval hx or new complaints Past Medical History:  Diagnosis Date  . ALLERGIC RHINITIS 11/06/2007  . 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 ANEMIA-NOS 06/17/2008  . ANXIETY 02/22/2008  . CARDIOMYOPATHY, PRIMARY, DILATED 12/30/2008  . [place] DISEASE, 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  . GERD 05/07/2007  . HYPERLIPIDEMIA 05/23/2007  . HYPERTENSION 05/07/2007  . 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 MENOPAUSAL DISORDER 05/06/2008  . 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 OVERACTIVE BLADDER 08/03/2010  . PYELONEPHRITIS, HX OF 05/07/2007  . SYSTOLIC HEART FAILURE, CHRONIC 12/28/2008  . VITAMIN D DEFICIENCY 06/17/2008   Past Surgical History:  Procedure Laterality Date  . ABDOMINAL HYSTERECTOMY    . ICD GENERATOR CHANGEOUT N/A 10/25/2017   Procedure: ICD GENERATOR CHANGEOUT;  Surgeon:  Deboraha Sprang, MD;  Location: [facility];  Service: Cardiovascular;  Laterality: N/A;  . LAMINECTOMY      reports that she has never smoked. She has never used smokeless tobacco. She reports that she does not drink alcohol or use drugs. family history includes Arthritis in her other; Coronary artery disease in her other; Heart disease in her mother; Kidney disease in her other. Allergies  Allergen Reactions  . Zocor [Simvastatin - High Dose] Nausea Only    Nausea    Current Outpatient Medications on File Prior to Visit  Medication Sig Dispense Refill  . acetaminophen (TYLENOL) 500 MG tablet Take 500 mg by mouth every 6 (six) hours as needed for mild pain or moderate pain.    [name] albuterol (PROVENTIL HFA;VENTOLIN HFA) 108 (90 BASE) MCG/ACT inhaler Inhale 2 puffs into the lungs every 6 (six) hours as needed for wheezing or shortness of breath. 3 Inhaler 3  . ALPRAZolam (XANAX) 0.25 MG tablet Take 1 tablet (0.25 mg total) by mouth 2 (two) times daily as needed for anxiety. 60 tablet 2  . butalbital-acetaminophen-caffeine (FIORICET, ESGIC) 50-325-40 MG tablet TAKE 1 TABLET BY MOUTH TWICE A DAY AS NEEDED FOR HEADACHE 30 tablet 0  . carvedilol (COREG) 25 MG tablet Take 1 tablet (25 mg total) by mouth 2 (two) times daily. 180 tablet 2  . diclofenac (VOLTAREN) 75 MG EC tablet TAKE 1 TABLET BY MOUTH TWO  TIMES DAILY (Patient taking differently: TAKE 1 TABLET BY MOUTH TWO  TIMES DAILY AS NEEDED) 180 tablet 1  .  ENTRESTO 24-26 MG TAKE 1 TABLET BY MOUTH 2 (TWO) TIMES DAILY. 60 tablet 11  . estradiol (ESTRACE) 1 MG tablet TAKE 1 TABLET BY MOUTH EVERY DAY 90 tablet 2  . furosemide (LASIX) 40 MG tablet Take 2 tablets (80 mg total) by mouth 2 (two) times daily. (Patient taking differently: Take 40 mg by mouth 2 (two) times daily. ) 180 tablet 3  . HYDROcodone-acetaminophen (NORCO/VICODIN) 5-325 MG tablet Take 1 tablet by mouth every 6 (six) hours as needed for moderate pain. 30 tablet 0  . ibuprofen  (ADVIL,MOTRIN) 800 MG tablet TAKE 1 TABLET (800 MG TOTAL) EVERY 8 (EIGHT) HOURS AS NEEDED BY MOUTH. 40 tablet 2  . promethazine (PHENERGAN) 25 MG tablet Take 1 tablet (25 mg total) by mouth every 6 (six) hours as needed for nausea or vomiting. 40 tablet 1  . tiZANidine (ZANAFLEX) 4 MG tablet TAKE 1 TABLET BY MOUTH  EVERY 6 HOURS AS NEEDED. 180 tablet 1  . traMADol (ULTRAM) 50 MG tablet Take 1 tablet (50 mg total) every 6 (six) hours as needed by mouth. 60 tablet 2  . triamcinolone (NASACORT AQ) 55 MCG/ACT AERO nasal inhaler Place 2 sprays into the nose daily. (Patient taking differently: Place 2 sprays into the nose daily as needed. ) 1 Inhaler 5  . Vitamin D, Ergocalciferol, (DRISDOL) 50000 units CAPS capsule Take 1 capsule (50,000 Units total) by mouth every 7 (seven) days. 12 capsule 0   No current facility-administered medications on file prior to visit.    Review of Systems  Constitutional: Negative for other unusual diaphoresis or sweats HENT: Negative for ear discharge or swelling Eyes: Negative for other worsening visual disturbances Respiratory: Negative for stridor or other swelling  Gastrointestinal: Negative for worsening distension or other blood Genitourinary: Negative for retention or other urinary change Musculoskeletal: Negative for other MSK pain or swelling Skin: Negative for color change or other new lesions Neurological: Negative for worsening tremors and other numbness  Psychiatric/Behavioral: Negative for worsening agitation or other fatigue All other system neg per pt    Objective:   Physical Exam BP 116/78   Pulse 75   Temp 98.2 F (36.8 C) (Oral)   Ht 5\' 7"  (1.702 m)   Wt 220 lb (99.8 kg)   SpO2 96%   BMI 34.46 kg/m  VS noted,  Constitutional: Pt appears in NAD HENT: Head: NCAT.  Right Ear: External ear normal.  Left Ear: External ear normal.  Eyes: . Pupils are equal, round, and reactive to light. Conjunctivae and EOM are normal Nose: without d/c or  deformity Neck: Neck supple. Gross normal ROM Cardiovascular: Normal rate and regular rhythm.   Pulmonary/Chest: Effort normal and breath sounds without rales or wheezing.  Abd:  Soft, NT, ND, + BS, no organomegaly Neurological: Pt is alert. At baseline orientation, motor grossly intact except for LLE 4+/5 chronic persistent Skin: Skin is warm. No rashes, other new lesions, no LE edema Psychiatric: Pt behavior is normal without agitation  No other exam findings  Lab Results  Component Value Date   WBC 10.7 10/11/2017   HGB 10.8 (L) 10/11/2017   HCT 33.9 (L) 10/11/2017   PLT 226 10/11/2017   GLUCOSE 85 10/11/2017   CHOL 202 (H) 05/16/2017   TRIG 124.0 05/16/2017   HDL 52.80 05/16/2017   LDLDIRECT 129.7 02/07/2013   LDLCALC 124 (H) 05/16/2017   ALT 9 05/16/2017   AST 11 05/16/2017   NA 140 10/11/2017   K 4.4 10/11/2017   CL  102 10/11/2017   CREATININE 1.38 (H) 10/11/2017   BUN 17 10/11/2017   CO2 23 10/11/2017   TSH 0.79 05/16/2017   INR 0.9 RATIO 06/18/2007   HGBA1C 5.9 11/06/2012       Assessment & Plan:

## 2017-11-15 ENCOUNTER — Other Ambulatory Visit: Payer: Self-pay | Admitting: Internal Medicine

## 2017-12-02 ENCOUNTER — Other Ambulatory Visit: Payer: Self-pay | Admitting: Internal Medicine

## 2017-12-07 ENCOUNTER — Ambulatory Visit: Payer: Medicare Other | Admitting: Internal Medicine

## 2017-12-28 ENCOUNTER — Other Ambulatory Visit: Payer: Self-pay | Admitting: Cardiology

## 2017-12-28 DIAGNOSIS — I428 Other cardiomyopathies: Secondary | ICD-10-CM

## 2018-01-10 ENCOUNTER — Encounter: Payer: Medicare Other | Admitting: Internal Medicine

## 2018-01-28 ENCOUNTER — Other Ambulatory Visit: Payer: Self-pay | Admitting: Internal Medicine

## 2018-02-02 ENCOUNTER — Ambulatory Visit: Payer: Medicare Other | Admitting: Internal Medicine

## 2018-02-02 ENCOUNTER — Encounter: Payer: Self-pay | Admitting: Internal Medicine

## 2018-02-02 VITALS — BP 116/68 | HR 78 | Ht 67.0 in | Wt 214.8 lb

## 2018-02-02 DIAGNOSIS — I493 Ventricular premature depolarization: Secondary | ICD-10-CM | POA: Diagnosis not present

## 2018-02-02 DIAGNOSIS — Z95810 Presence of automatic (implantable) cardiac defibrillator: Secondary | ICD-10-CM | POA: Diagnosis not present

## 2018-02-02 DIAGNOSIS — I5022 Chronic systolic (congestive) heart failure: Secondary | ICD-10-CM | POA: Diagnosis not present

## 2018-02-02 DIAGNOSIS — I428 Other cardiomyopathies: Secondary | ICD-10-CM

## 2018-02-02 LAB — CUP PACEART INCLINIC DEVICE CHECK
Battery Voltage: 3.06 V
Date Time Interrogation Session: 20190726102300
HighPow Impedance: 38 Ohm
HighPow Impedance: 49 Ohm
Implantable Lead Implant Date: 20081215
Implantable Lead Location: 753860
Implantable Pulse Generator Implant Date: 20190418
Lead Channel Impedance Value: 494 Ohm
Lead Channel Pacing Threshold Amplitude: 0.75 V
Lead Channel Sensing Intrinsic Amplitude: 17.875 mV
Lead Channel Setting Pacing Amplitude: 2.5 V
Lead Channel Setting Pacing Pulse Width: 0.4 ms
Lead Channel Setting Sensing Sensitivity: 0.3 mV
MDC IDC MSMT BATTERY REMAINING LONGEVITY: 132 mo
MDC IDC MSMT LEADCHNL RV IMPEDANCE VALUE: 380 Ohm
MDC IDC MSMT LEADCHNL RV PACING THRESHOLD PULSEWIDTH: 0.4 ms
MDC IDC MSMT LEADCHNL RV SENSING INTR AMPL: 31.25 mV
MDC IDC STAT BRADY RV PERCENT PACED: 0.01 %

## 2018-02-02 MED ORDER — CARVEDILOL 25 MG PO TABS: 25.0000 mg | ORAL_TABLET | Freq: Two times a day (BID) | ORAL | 2 refills | Status: DC

## 2018-02-02 NOTE — Patient Instructions (Signed)
Medication Instructions:  Your physician recommends that you continue on your current medications as directed. Please refer to the Current Medication list given to you today.  Labwork: None ordered.  Testing/Procedures: None ordered.  Follow-Up: Your physician wants you to follow-up in: One Year with Dr Caryl Comes. You will receive a reminder letter in the mail two months in advance. If you don't receive a letter, please call our office to schedule the follow-up appointment.  Remote monitoring is used to monitor your Pacemaker from home. This monitoring reduces the number of office visits required to check your device to one time per year. It allows [place] to keep an eye on the functioning of your device to ensure it is working properly. You are scheduled for a device check from home on 05/07/18. You may send your transmission at any time that day. If you have a wireless device, the transmission will be sent automatically. After your physician reviews your transmission, you will receive a postcard with your next transmission date.    Any Other Special Instructions Will Be Listed Below (If Applicable).     If you need a refill on your cardiac medications before your next appointment, please call your pharmacy.

## 2018-02-02 NOTE — Progress Notes (Signed)
Patient Care Team: Biagio Borg, MD as PCP - General Stanford Breed Denice Bors, MD as Consulting Physician (Cardiology) Deboraha Sprang, MD as Consulting Physician (Cardiology)   HPI  [name] [name] is a 77 y.o. female seen in followup for an ICD implanted 2008 for primary prevention in the setting of nonischemic heart disease. She has chronic systolic heart failure with a relatively narrow QRS and underwent single-chamber defibrillator implantation.   The patient denies chest pain,  nocturnal dyspnea, orthopnea or peripheral edema.  There have been no lightheadedness or syncope.   Her biggest concern is that her bad days are characterized by fatigue. She sleeps poorly. She can't go to sleep. She doesn't think that she   snores.   DATE TEST EF   11/08 MYOVIEW  34 %   3/15 Echo   15-20 %   10/18 Echo  25%    Date PVCs  11/15 11%          Re PVCs-- I asked [facility] to consider catheter ablation. Her symptoms seem to be improved and her PVC burden by ECG had diminished and so medical therapy was recommended.   Date Cr K Hgb  4/19 1.38 4.4 10.8         PVC has been largely quiescient.  Energy level is pretty good.  Denies dyspnea.   No edema.  No chest pain.  She is having some stinging at her device generator replacement site.  She is a keloid former.   Past Medical History:  Diagnosis Date  . ALLERGIC RHINITIS 11/06/2007  . 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 ANEMIA-NOS 06/17/2008  . ANXIETY 02/22/2008  . CARDIOMYOPATHY, PRIMARY, DILATED 12/30/2008  . [place] DISEASE, 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  . GERD 05/07/2007  . HYPERLIPIDEMIA 05/23/2007  . HYPERTENSION 05/07/2007  . 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 MENOPAUSAL DISORDER 05/06/2008  . 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 OVERACTIVE BLADDER 08/03/2010  . PYELONEPHRITIS, HX OF 05/07/2007  . SYSTOLIC HEART FAILURE, CHRONIC 12/28/2008  . VITAMIN D DEFICIENCY 06/17/2008    Past Surgical History:  Procedure  Laterality Date  . ABDOMINAL HYSTERECTOMY    . ICD GENERATOR CHANGEOUT N/A 10/25/2017   Procedure: ICD GENERATOR CHANGEOUT;  Surgeon: Deboraha Sprang, MD;  Location: [facility];  Service: Cardiovascular;  Laterality: N/A;  . LAMINECTOMY      Current Outpatient Medications  Medication Sig Dispense Refill  . acetaminophen (TYLENOL) 500 MG tablet Take 500 mg by mouth every 6 (six) hours as needed for mild pain or moderate pain.    [name] albuterol (PROVENTIL HFA;VENTOLIN HFA) 108 (90 BASE) MCG/ACT inhaler Inhale 2 puffs into the lungs every 6 (six) hours as needed for wheezing or shortness of breath. 3 Inhaler 3  . ALPRAZolam (XANAX) 0.25 MG tablet Take 1 tablet (0.25 mg total) by mouth 2 (two) times daily as needed for anxiety. 60 tablet 2  . ENTRESTO 24-26 MG TAKE 1 TABLET BY MOUTH 2 (TWO) TIMES DAILY. 180 tablet 3  . estradiol (ESTRACE) 1 MG tablet TAKE 1 TABLET BY MOUTH EVERY DAY 90 tablet 2  . furosemide (LASIX) 40 MG tablet Take 2 tablets (80 mg total) by mouth 2 (two) times daily. (Patient taking differently: Take 40 mg by mouth 2 (two) times daily. ) 180 tablet 3  . gabapentin (NEURONTIN) 300 MG capsule Take 1 capsule (300 mg total) by mouth 3 (three) times daily. 270 capsule 1  . HYDROcodone-acetaminophen (NORCO/VICODIN) 5-325 MG tablet Take 1 tablet by  mouth every 6 (six) hours as needed for moderate pain. 30 tablet 0  . ibuprofen (ADVIL,MOTRIN) 800 MG tablet TAKE 1 TABLET (800 MG TOTAL) EVERY 8 (EIGHT) HOURS AS NEEDED BY MOUTH. 40 tablet 2  . promethazine (PHENERGAN) 25 MG tablet Take 1 tablet (25 mg total) by mouth every 6 (six) hours as needed for nausea or vomiting. 40 tablet 1  . tiZANidine (ZANAFLEX) 4 MG tablet TAKE 1 TABLET BY MOUTH  EVERY 6 HOURS AS NEEDED. 180 tablet 1  . triamcinolone (NASACORT AQ) 55 MCG/ACT AERO nasal inhaler Place 2 sprays into the nose daily. (Patient taking differently: Place 2 sprays into the nose daily as needed. ) 1 Inhaler 5  . Vitamin D,  Ergocalciferol, (DRISDOL) 50000 units CAPS capsule Take 1 capsule (50,000 Units total) by mouth every 7 (seven) days. 12 capsule 0  . butalbital-acetaminophen-caffeine (FIORICET, ESGIC) 50-325-40 MG tablet TAKE 1 TABLET BY MOUTH TWICE A DAY AS NEEDED FOR HEADACHE (Patient not taking: Reported on 02/02/2018) 30 tablet 0  . carvedilol (COREG) 25 MG tablet Take 1 tablet (25 mg total) by mouth 2 (two) times daily. 180 tablet 2  . diclofenac (VOLTAREN) 75 MG EC tablet TAKE 1 TABLET BY MOUTH TWO  TIMES DAILY (Patient not taking: Reported on 02/02/2018) 180 tablet 1  . traMADol (ULTRAM) 50 MG tablet Take 1 tablet (50 mg total) every 6 (six) hours as needed by mouth. (Patient not taking: Reported on 02/02/2018) 60 tablet 2   No current facility-administered medications for this visit.     Allergies  Allergen Reactions  . Zocor [Simvastatin - High Dose] Nausea Only    Nausea     Review of Systems negative except from HPI and PMH  Physical Exam BP 116/68   Pulse 78   Ht 5\' 7"  (1.702 m)   Wt 214 lb 12.8 oz (97.4 kg)   BMI 33.64 kg/m  Well developed and nourished in no acute distress HENT normal Neck supple with JVP-flat Clear Device pocket well healed; without hematoma or erythema.  There is no tethering small keloid Regular rate and rhythm, no murmurs or gallops Abd-soft with active BS No Clubbing cyanosis edema Skin-warm and dry A & Oriented  Grossly normal sensory and motor function    ECG  Sinus @ 72 17/12/41  Assessment and  Plan  Nonischemic cardiomyopathy   PVCs  Implantable defibrillator-Medtronic  The patient's device was interrogated.  The information was reviewed. No changes were made in the programming.     Congestive heart failure-chronic-systolic   Euvolemic continue current meds  She is organized like my mom knowing where everything is, x she loses money .

## 2018-02-25 ENCOUNTER — Other Ambulatory Visit: Payer: Self-pay | Admitting: Internal Medicine

## 2018-03-05 ENCOUNTER — Telehealth: Payer: Self-pay | Admitting: Cardiology

## 2018-03-05 DIAGNOSIS — I5022 Chronic systolic (congestive) heart failure: Secondary | ICD-10-CM

## 2018-03-05 MED ORDER — FUROSEMIDE 40 MG PO TABS: 40.0000 mg | ORAL_TABLET | Freq: Two times a day (BID) | ORAL | 3 refills | Status: DC

## 2018-03-05 NOTE — Telephone Encounter (Signed)
New Message:      *STAT* If patient is at the pharmacy, call can be transferred to refill team.   1. Which medications need to be refilled? (please list name of each medication and dose if known) furosemide (LASIX) 40 MG tablet  2. Which pharmacy/location (including street and city if local pharmacy) is medication to be sent to?[place], [place]  3. Do they need a 30 day or 90 day supply? [place]

## 2018-03-30 ENCOUNTER — Other Ambulatory Visit: Payer: Self-pay | Admitting: Internal Medicine

## 2018-03-30 NOTE — Telephone Encounter (Signed)
Done erx 

## 2018-04-26 NOTE — Progress Notes (Signed)
HPI: FU nonischemic cardiomyopathy.Cardiac catheterization August 2004 showed normal coronary arteries.She had an ICD placed on June 25, 2007. 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 Patient was referred to Dr. Lovena Le by Dr. Caryl Comes for consideration of ablation but Dr. Lovena Le felt medical therapy indicated.Nuclear study repeated April 2018 and showed ejection fraction 35% with no ischemia or infarction.  Last echocardiogram October 2018 showed ejection fraction 25-30% and mild mitral regurgitation.  Since I last saw her,she denies dyspnea on exertion, orthopnea, PND or pedal edema.  Occasional indigestion relieved with belching but no exertional chest pain.   Current Outpatient Medications  Medication Sig Dispense Refill  . acetaminophen (TYLENOL) 500 MG tablet Take 500 mg by mouth every 6 (six) hours as needed for mild pain or moderate pain.    [name] albuterol (PROVENTIL HFA;VENTOLIN HFA) 108 (90 BASE) MCG/ACT inhaler Inhale 2 puffs into the lungs every 6 (six) hours as needed for wheezing or shortness of breath. 3 Inhaler 3  . ALPRAZolam (XANAX) 0.25 MG tablet Take 1 tablet (0.25 mg total) by mouth 2 (two) times daily as needed for anxiety. 60 tablet 2  . butalbital-acetaminophen-caffeine (FIORICET, ESGIC) 50-325-40 MG tablet TAKE 1 TABLET BY MOUTH TWICE A DAY AS NEEDED FOR HEADACHE 30 tablet 0  . diclofenac (VOLTAREN) 75 MG EC tablet TAKE 1 TABLET BY MOUTH TWO  TIMES DAILY 180 tablet 1  . ENTRESTO 24-26 MG TAKE 1 TABLET BY MOUTH 2 (TWO) TIMES DAILY. 180 tablet 3  . estradiol (ESTRACE) 1 MG tablet TAKE 1 TABLET BY MOUTH EVERY DAY 90 tablet 2  . furosemide (LASIX) 40 MG tablet Take 1 tablet (40 mg total) by mouth 2 (two) times daily. 180 tablet 3  . HYDROcodone-acetaminophen (NORCO/VICODIN) 5-325 MG tablet Take 1 tablet by mouth every 6 (six) hours as needed for moderate pain. 30 tablet 0  . ibuprofen (ADVIL,MOTRIN) 800 MG tablet TAKE 1 TABLET (800 MG  TOTAL) EVERY 8 (EIGHT) HOURS AS NEEDED BY MOUTH. 40 tablet 2  . tiZANidine (ZANAFLEX) 4 MG tablet TAKE 1 TABLET BY MOUTH  EVERY 6 HOURS AS NEEDED. 180 tablet 1  . traMADol (ULTRAM) 50 MG tablet TAKE 1 TABLET BY MOUTH EVERY 6 HOURS AS NEEDED 60 tablet 2  . triamcinolone (NASACORT AQ) 55 MCG/ACT AERO nasal inhaler Place 2 sprays into the nose daily. (Patient taking differently: Place 2 sprays into the nose daily as needed. ) 1 Inhaler 5  . Vitamin D, Ergocalciferol, (DRISDOL) 50000 units CAPS capsule Take 1 capsule (50,000 Units total) by mouth every 7 (seven) days. 12 capsule 0  . carvedilol (COREG) 25 MG tablet Take 1 tablet (25 mg total) by mouth 2 (two) times daily. 180 tablet 2   No current facility-administered medications for this visit.      Past Medical History:  Diagnosis Date  . ALLERGIC RHINITIS 11/06/2007  . 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 ANEMIA-NOS 06/17/2008  . ANXIETY 02/22/2008  . CARDIOMYOPATHY, PRIMARY, DILATED 12/30/2008  . [place] DISEASE, 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  . GERD 05/07/2007  . HYPERLIPIDEMIA 05/23/2007  . HYPERTENSION 05/07/2007  . 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 MENOPAUSAL DISORDER 05/06/2008  . 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 OVERACTIVE BLADDER 08/03/2010  . PYELONEPHRITIS, HX OF 05/07/2007  . SYSTOLIC HEART FAILURE, CHRONIC 12/28/2008  . VITAMIN D DEFICIENCY 06/17/2008    Past Surgical History:  Procedure Laterality Date  . ABDOMINAL HYSTERECTOMY    . ICD GENERATOR CHANGEOUT N/A 10/25/2017  Procedure: ICD GENERATOR CHANGEOUT;  Surgeon: Deboraha Sprang, MD;  Location: [facility];  Service: Cardiovascular;  Laterality: N/A;  . LAMINECTOMY      Social History   Socioeconomic History  . Marital status: Widowed    Spouse name: Not on file  . Number of children: 3  . Years of education: Not on file  . Highest education level: Not on file  Occupational History  . Occupation: retired Ambulance person:  RETIRED  Social Needs  . Financial resource strain: Not on file  . Food insecurity:    Worry: Not on file    Inability: Not on file  . Transportation needs:    Medical: Not on file    Non-medical: Not on file  Tobacco Use  . Smoking status: Never Smoker  . Smokeless tobacco: Never Used  Substance and Sexual Activity  . Alcohol use: No  . Drug use: No  . Sexual activity: Never  Lifestyle  . Physical activity:    Days per week: Not on file    Minutes per session: Not on file  . Stress: Not on file  Relationships  . Social connections:    Talks on phone: Not on file    Gets together: Not on file    Attends religious service: Not on file    Active member of club or organization: Not on file    Attends meetings of clubs or organizations: Not on file    Relationship status: Not on file  . Intimate partner violence:    Fear of current or ex partner: Not on file    Emotionally abused: Not on file    Physically abused: Not on file    Forced sexual activity: Not on file  Other Topics Concern  . Not on file  Social History Narrative  . Not on file    Family History  Problem Relation Age of Onset  . Arthritis Other   . Coronary artery disease Other   . Kidney disease Other   . Heart disease Mother     ROS: no fevers or chills, productive cough, hemoptysis, dysphasia, odynophagia, melena, hematochezia, dysuria, hematuria, rash, seizure activity, orthopnea, PND, pedal edema, claudication. Remaining systems are negative.  Physical Exam: Well-developed well-nourished in no acute distress.  Skin is warm and dry.  HEENT is normal.  Neck is supple.  Chest is clear to auscultation with normal expansion.  ICD left chest Cardiovascular exam is regular rate and rhythm.  Abdominal exam nontender or distended. No masses palpated. Extremities show no edema. neuro grossly intact  A/P  1 nonischemic cardiomyopathy-plan to continue medical therapy including Entresto, beta-blocker;  add spironolactone 12.5 mg daily.  Check potassium and renal function in 1 week.  2 chronic systolic congestive heart failure-patient appears to be euvolemic.  Continue present dose of diuretics.  We discussed the importance of fluid restriction and low-sodium diet.  Check potassium and renal function.  3 prior ICD-followed by Dr. Caryl Comes.  4 hypertension-patient's blood pressure is controlled.  Continue present medications.  5 hyperlipidemia-continue statin.   6 history of PVCs-continue beta-blockade.  Previously seen by Dr. Lovena Le and ablation not pursued.  Kirk Ruths, MD

## 2018-05-07 ENCOUNTER — Ambulatory Visit: Payer: Medicare Other | Admitting: Cardiology

## 2018-05-07 ENCOUNTER — Ambulatory Visit (INDEPENDENT_AMBULATORY_CARE_PROVIDER_SITE_OTHER): Payer: Medicare Other | Admitting: *Deleted

## 2018-05-07 ENCOUNTER — Encounter: Payer: Self-pay | Admitting: Cardiology

## 2018-05-07 VITALS — BP 128/68 | HR 70 | Ht 67.0 in | Wt 215.0 lb

## 2018-05-07 DIAGNOSIS — I428 Other cardiomyopathies: Secondary | ICD-10-CM

## 2018-05-07 DIAGNOSIS — E7800 Pure hypercholesterolemia, unspecified: Secondary | ICD-10-CM

## 2018-05-07 DIAGNOSIS — I5022 Chronic systolic (congestive) heart failure: Secondary | ICD-10-CM

## 2018-05-07 DIAGNOSIS — I10 Essential (primary) hypertension: Secondary | ICD-10-CM | POA: Diagnosis not present

## 2018-05-07 MED ORDER — SPIRONOLACTONE 25 MG PO TABS: 12.5000 mg | ORAL_TABLET | Freq: Every day | ORAL | 3 refills | Status: DC

## 2018-05-07 NOTE — Patient Instructions (Signed)
Medication Instructions:  START SPIRONOLACTONE 12.5 MG ONCE DAILY= 1/2 OF THE 25 MG TABLET ONCE DAILY If you need a refill on your cardiac medications before your next appointment, please call your pharmacy.   Lab work: Your physician recommends that you return for lab work in: [place] If you have labs (blood work) drawn today and your tests are completely normal, you will receive your results only by: [name] MyChart Message (if you have MyChart) OR . A paper copy in the mail If you have any lab test that is abnormal or we need to change your treatment, we will call you to review the results.  Follow-Up: At [facility], you and your health needs are our priority.  As part of our continuing mission to provide you with exceptional heart care, we have created designated Provider Care Teams.  These Care Teams include your primary Cardiologist (physician) and Advanced Practice Providers (APPs -  Physician Assistants and Nurse Practitioners) who all work together to provide you with the care you need, when you need it. You will need a follow up appointment in 12 months.  Please call our office 2 months in advance to schedule this appointment.  You may see Kirk Ruths MD or one of the following Advanced Practice Providers on your designated Care Team:   Kerin Ransom, PA-C Roby Lofts, [place] . Sande Rives, PA-C

## 2018-05-07 NOTE — Progress Notes (Signed)
Remote ICD transmission.   

## 2018-05-18 ENCOUNTER — Ambulatory Visit: Payer: Medicare Other | Admitting: Internal Medicine

## 2018-05-22 ENCOUNTER — Other Ambulatory Visit (INDEPENDENT_AMBULATORY_CARE_PROVIDER_SITE_OTHER): Payer: Medicare Other

## 2018-05-22 ENCOUNTER — Encounter: Payer: Self-pay | Admitting: Internal Medicine

## 2018-05-22 ENCOUNTER — Ambulatory Visit (INDEPENDENT_AMBULATORY_CARE_PROVIDER_SITE_OTHER): Payer: Medicare Other | Admitting: Internal Medicine

## 2018-05-22 VITALS — BP 116/68 | HR 74 | Temp 97.7°F | Ht 67.0 in | Wt 220.0 lb

## 2018-05-22 DIAGNOSIS — G43809 Other migraine, not intractable, without status migrainosus: Secondary | ICD-10-CM

## 2018-05-22 DIAGNOSIS — Z0000 Encounter for general adult medical examination without abnormal findings: Secondary | ICD-10-CM

## 2018-05-22 DIAGNOSIS — M19019 Primary osteoarthritis, unspecified shoulder: Secondary | ICD-10-CM | POA: Diagnosis not present

## 2018-05-22 DIAGNOSIS — R790 Abnormal level of blood mineral: Secondary | ICD-10-CM

## 2018-05-22 DIAGNOSIS — E2839 Other primary ovarian failure: Secondary | ICD-10-CM

## 2018-05-22 DIAGNOSIS — Z23 Encounter for immunization: Secondary | ICD-10-CM

## 2018-05-22 LAB — BASIC METABOLIC PANEL
BUN: 20 mg/dL (ref 6–23)
CALCIUM: 8.9 mg/dL (ref 8.4–10.5)
CO2: 29 mEq/L (ref 19–32)
Chloride: 104 mEq/L (ref 96–112)
Creatinine, Ser: 1.61 mg/dL — ABNORMAL HIGH (ref 0.40–1.20)
GFR: 40.07 mL/min — AB (ref >60.00)
Glucose, Bld: 99 mg/dL (ref 70–99)
Potassium: 4.7 mEq/L (ref 3.5–5.1)
SODIUM: 140 meq/L (ref 135–145)

## 2018-05-22 LAB — CBC WITH DIFFERENTIAL/PLATELET
Basophils Absolute: 0 10*3/uL (ref 0.0–0.1)
Basophils Relative: 0.1 % (ref 0.0–3.0)
EOS PCT: 3.4 % (ref 0.0–5.0)
Eosinophils Absolute: 0.2 10*3/uL (ref 0.0–0.7)
HEMATOCRIT: 33.4 % — AB (ref 36.0–46.0)
Hemoglobin: 10.8 g/dL — ABNORMAL LOW (ref 12.0–15.0)
LYMPHS ABS: 3.6 10*3/uL (ref 0.7–4.0)
Lymphocytes Relative: 55.7 % — ABNORMAL HIGH (ref 12.0–46.0)
MCHC: 32.5 g/dL (ref 30.0–36.0)
MCV: 81.6 fl (ref 78.0–100.0)
MONOS PCT: 7.8 % (ref 3.0–12.0)
Monocytes Absolute: 0.5 10*3/uL (ref 0.1–1.0)
NEUTROS PCT: 33 % — AB (ref 43.0–77.0)
Neutro Abs: 2.2 10*3/uL (ref 1.4–7.7)
Platelets: 198 10*3/uL (ref 150.0–400.0)
RBC: 4.09 Mil/uL (ref 3.87–5.11)
RDW: 15 % (ref 11.5–15.5)
WBC: 6.5 10*3/uL (ref 4.0–10.5)

## 2018-05-22 LAB — URINALYSIS, ROUTINE W REFLEX MICROSCOPIC
BILIRUBIN URINE: NEGATIVE
Ketones, ur: NEGATIVE
Nitrite: NEGATIVE
Specific Gravity, Urine: 1.02 (ref 1.000–1.030)
UROBILINOGEN UA: 0.2 (ref 0.0–1.0)
Urine Glucose: NEGATIVE
pH: 5.5 (ref 5.0–8.0)

## 2018-05-22 LAB — LIPID PANEL
Cholesterol: 193 mg/dL (ref 0–200)
HDL: 58.4 mg/dL (ref >39.00)
LDL Cholesterol: 104 mg/dL — ABNORMAL HIGH (ref 0–99)
NonHDL: 134.13
TRIGLYCERIDES: 149 mg/dL (ref 0.0–149.0)
Total CHOL/HDL Ratio: 3
VLDL: 29.8 mg/dL (ref 0.0–40.0)

## 2018-05-22 LAB — TSH: TSH: 1.01 u[IU]/mL (ref 0.35–4.50)

## 2018-05-22 LAB — HEPATIC FUNCTION PANEL
ALBUMIN: 3.9 g/dL (ref 3.5–5.2)
ALT: 10 U/L (ref 0–35)
AST: 11 U/L (ref 0–37)
Alkaline Phosphatase: 66 U/L (ref 39–117)
Bilirubin, Direct: 0.1 mg/dL (ref 0.0–0.3)
TOTAL PROTEIN: 6.9 g/dL (ref 6.0–8.3)
Total Bilirubin: 0.4 mg/dL (ref 0.2–1.2)

## 2018-05-22 LAB — MAGNESIUM: Magnesium: 1.8 mg/dL (ref 1.5–2.5)

## 2018-05-22 MED ORDER — MELOXICAM 15 MG PO TABS: 15.0000 mg | ORAL_TABLET | Freq: Every day | ORAL | 3 refills | Status: DC | PRN

## 2018-05-22 MED ORDER — TOPIRAMATE 50 MG PO TABS: 50.0000 mg | ORAL_TABLET | Freq: Two times a day (BID) | ORAL | 3 refills | Status: DC

## 2018-05-22 NOTE — Assessment & Plan Note (Signed)
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

## 2018-05-22 NOTE — Addendum Note (Signed)
Addended by: Juliet Rude on: 05/22/2018 10:29 AM   Modules accepted: Orders

## 2018-05-22 NOTE — Assessment & Plan Note (Signed)
fioricet no longer working, tramadol at home makes her feel funny, ok for change fioricet to topamax preventive,  to f/u any worsening symptoms or concerns

## 2018-05-22 NOTE — Assessment & Plan Note (Signed)

## 2018-05-22 NOTE — Progress Notes (Signed)
Subjective:    Patient ID: [name] [name], female    DOB: 07-11-1941, 77 y.o.   MRN: [id number]  HPI  Here for wellness and f/u;  Overall doing ok;  Pt denies Chest pain, worsening SOB, DOE, wheezing, orthopnea, PND, worsening LE edema, palpitations, dizziness or syncope.  Pt denies neurological change such as new headache, facial or extremity weakness.  Pt denies polydipsia, polyuria, or low sugar symptoms. Pt states overall good compliance with treatment and medications, good tolerability, and has been trying to follow appropriate diet.  Pt denies worsening depressive symptoms, suicidal ideation or panic. No fever, night sweats, wt loss, loss of appetite, or other constitutional symptoms.  Pt states good ability with ADL's, has low fall risk, home safety reviewed and adequate, no other significant changes in hearing or vision, and only occasionally active with exercise.   Doing cologuard at home, declines colonosocpy.  Has been trying to work on lower chol except for just recently, not too excited about more medicaiton for lowering chol.  Needs fioret change as no longer working for the migraines, and voltaren change as no longer covered with her insurance Past Medical History:  Diagnosis Date  . ALLERGIC RHINITIS 11/06/2007  . 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 ANEMIA-NOS 06/17/2008  . ANXIETY 02/22/2008  . CARDIOMYOPATHY, PRIMARY, DILATED 12/30/2008  . [place] DISEASE, 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  . GERD 05/07/2007  . HYPERLIPIDEMIA 05/23/2007  . HYPERTENSION 05/07/2007  . 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 MENOPAUSAL DISORDER 05/06/2008  . 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 OVERACTIVE BLADDER 08/03/2010  . PYELONEPHRITIS, HX OF 05/07/2007  . SYSTOLIC HEART FAILURE, CHRONIC 12/28/2008  . VITAMIN D DEFICIENCY 06/17/2008   Past Surgical History:  Procedure Laterality Date  . ABDOMINAL HYSTERECTOMY    . ICD GENERATOR CHANGEOUT N/A 10/25/2017   Procedure: ICD GENERATOR  CHANGEOUT;  Surgeon: Deboraha Sprang, MD;  Location: [facility];  Service: Cardiovascular;  Laterality: N/A;  . LAMINECTOMY      reports that she has never smoked. She has never used smokeless tobacco. She reports that she does not drink alcohol or use drugs. family history includes Arthritis in her other; Coronary artery disease in her other; Heart disease in her mother; Kidney disease in her other. Allergies  Allergen Reactions  . Zocor [Simvastatin - High Dose] Nausea Only    Nausea    Current Outpatient Medications on File Prior to Visit  Medication Sig Dispense Refill  . acetaminophen (TYLENOL) 500 MG tablet Take 500 mg by mouth every 6 (six) hours as needed for mild pain or moderate pain.    [name] albuterol (PROVENTIL HFA;VENTOLIN HFA) 108 (90 BASE) MCG/ACT inhaler Inhale 2 puffs into the lungs every 6 (six) hours as needed for wheezing or shortness of breath. 3 Inhaler 3  . ALPRAZolam (XANAX) 0.25 MG tablet Take 1 tablet (0.25 mg total) by mouth 2 (two) times daily as needed for anxiety. 60 tablet 2  . ENTRESTO 24-26 MG TAKE 1 TABLET BY MOUTH 2 (TWO) TIMES DAILY. 180 tablet 3  . estradiol (ESTRACE) 1 MG tablet TAKE 1 TABLET BY MOUTH EVERY DAY 90 tablet 2  . furosemide (LASIX) 40 MG tablet Take 1 tablet (40 mg total) by mouth 2 (two) times daily. 180 tablet 3  . HYDROcodone-acetaminophen (NORCO/VICODIN) 5-325 MG tablet Take 1 tablet by mouth every 6 (six) hours as needed for moderate pain. 30 tablet 0  . ibuprofen (ADVIL,MOTRIN) 800 MG tablet TAKE 1 TABLET (800 MG TOTAL)  EVERY 8 (EIGHT) HOURS AS NEEDED BY MOUTH. 40 tablet 2  . spironolactone (ALDACTONE) 25 MG tablet Take 0.5 tablets (12.5 mg total) by mouth daily. 45 tablet 3  . tiZANidine (ZANAFLEX) 4 MG tablet TAKE 1 TABLET BY MOUTH  EVERY 6 HOURS AS NEEDED. 180 tablet 1  . traMADol (ULTRAM) 50 MG tablet TAKE 1 TABLET BY MOUTH EVERY 6 HOURS AS NEEDED 60 tablet 2  . triamcinolone (NASACORT AQ) 55 MCG/ACT AERO nasal inhaler Place 2  sprays into the nose daily. (Patient taking differently: Place 2 sprays into the nose daily as needed. ) 1 Inhaler 5  . Vitamin D, Ergocalciferol, (DRISDOL) 50000 units CAPS capsule Take 1 capsule (50,000 Units total) by mouth every 7 (seven) days. 12 capsule 0  . carvedilol (COREG) 25 MG tablet Take 1 tablet (25 mg total) by mouth 2 (two) times daily. 180 tablet 2   No current facility-administered medications on file prior to visit.    Review of Systems Constitutional: Negative for other unusual diaphoresis, sweats, appetite or weight changes HENT: Negative for other worsening hearing loss, ear pain, facial swelling, mouth sores or neck stiffness.   Eyes: Negative for other worsening pain, redness or other visual disturbance.  Respiratory: Negative for other stridor or swelling Cardiovascular: Negative for other palpitations or other chest pain  Gastrointestinal: Negative for worsening diarrhea or loose stools, blood in stool, distention or other pain Genitourinary: Negative for hematuria, flank pain or other change in urine volume.  Musculoskeletal: Negative for myalgias or other joint swelling.  Skin: Negative for other color change, or other wound or worsening drainage.  Neurological: Negative for other syncope or numbness. Hematological: Negative for other adenopathy or swelling Psychiatric/Behavioral: Negative for hallucinations, other worsening agitation, SI, self-injury, or new decreased concentration All other system neg per pt    Objective:   Physical Exam BP 116/68   Pulse 74   Temp 97.7 F (36.5 C) (Oral)   Ht 5\' 7"  (1.702 m)   Wt 220 lb (99.8 kg)   SpO2 97%   BMI 34.46 kg/m  VS noted,  Constitutional: Pt is oriented to person, place, and time. Appears well-developed and well-nourished, in no significant distress and comfortable Head: Normocephalic and atraumatic  Eyes: Conjunctivae and EOM are normal. Pupils are equal, round, and reactive to light Right Ear: External  ear normal without discharge Left Ear: External ear normal without discharge Nose: Nose without discharge or deformity Mouth/Throat: Oropharynx is without other ulcerations and moist  Neck: Normal range of motion. Neck supple. No JVD present. No tracheal deviation present or significant neck LA or mass Cardiovascular: Normal rate, regular rhythm, normal heart sounds and intact distal pulses.   Pulmonary/Chest: WOB normal and breath sounds without rales or wheezing  Abdominal: Soft. Bowel sounds are normal. NT. No HSM  Musculoskeletal: Normal range of motion. Exhibits no edema Lymphadenopathy: Has no other cervical adenopathy.  Neurological: Pt is alert and oriented to person, place, and time. Pt has normal reflexes. No cranial nerve deficit. Motor grossly intact, Gait intact Skin: Skin is warm and dry. No rash noted or new ulcerations Psychiatric:  Has normal mood and affect. Behavior is normal without agitation No other exam findings Lab Results  Component Value Date   WBC 10.7 10/11/2017   HGB 10.8 (L) 10/11/2017   HCT 33.9 (L) 10/11/2017   PLT 226 10/11/2017   GLUCOSE 85 10/11/2017   CHOL 202 (H) 05/16/2017   TRIG 124.0 05/16/2017   HDL 52.80 05/16/2017  LDLDIRECT 129.7 02/07/2013   LDLCALC 124 (H) 05/16/2017   ALT 9 05/16/2017   AST 11 05/16/2017   NA 140 10/11/2017   K 4.4 10/11/2017   CL 102 10/11/2017   CREATININE 1.38 (H) 10/11/2017   BUN 17 10/11/2017   CO2 23 10/11/2017   TSH 0.79 05/16/2017   INR 0.9 RATIO 06/18/2007   HGBA1C 5.9 11/06/2012       Assessment & Plan:

## 2018-05-22 NOTE — Patient Instructions (Addendum)
You had the Tdap tetanus shot today  Please remember to send in your Cologuard sample  Please schedule the bone density test before leaving today at the scheduling desk (where you check out)  OK to change the fioricet to topamax for migraine  OK to change the voltaren to mobic for pain  Please continue all other medications as before, and refills have been done if requested.  Please have the pharmacy call with any other refills you may need.  Please continue your efforts at being more active, low cholesterol diet, and weight control.  You are otherwise up to date with prevention measures today.  Please keep your appointments with your specialists as you may have planned  Please go to the LAB in the Basement (turn left off the elevator) for the tests to be done today  You will be contacted by phone if any changes need to be made immediately.  Otherwise, you will receive a letter about your results with an explanation, but please check with MyChart first.  Please return in 1 year for your yearly visit, or sooner if needed, with Lab testing done 3-5 days before

## 2018-06-04 ENCOUNTER — Ambulatory Visit: Payer: Self-pay | Admitting: *Deleted

## 2018-06-04 NOTE — Telephone Encounter (Signed)
Ok to just stop in this case, thanks, and would need ROV if symptoms persist

## 2018-06-04 NOTE — Telephone Encounter (Signed)
Pt  stated that she started taking Topamax on 05/22/18 and 4-5 days after being on the medication she has been having episodes of confusion and memory issues. Pt stated that she has been having visual hallucinations. Pt stated that she has a friend that lives 3 miles down the road and she got confused how to get there. Pt stated that her children have noticed it. Pt c/o being forgetful. Pt stated she did not feel this way until the Topamax was started. Pt wants to stop the medication. Advised pt that the doctor may need to wean the dosage before completely stopping the Topamax.   Reason for Disposition . Caller has NON-URGENT medication question about med that PCP prescribed and triager unable to answer question  Answer Assessment - Initial Assessment Questions 1. SYMPTOMS: "Do you have any symptoms?"     Pt stated that she started taking Topamax on 05/22/18 and 4-5 days after being on the medication she has been having episodes of confusion and memory issues. Pt stated that she has been having visual hallucinations. Pt stated that she has a friend that lives 3 miles down the road and she got confused how to get there. Pt stated that her children have noticed it. Pt c/o being forgetful. Pt stated she did not feel this way until the Topamax was started. Pt wants to stop the medication. Advised pt that the doctor may need to wean the dosage.  2. SEVERITY: If symptoms are present, ask "Are they mild, moderate or severe?"     Moderate to severe.  Protocols used: MEDICATION QUESTION CALL-A-AH

## 2018-06-05 ENCOUNTER — Other Ambulatory Visit: Payer: Medicare Other

## 2018-06-05 NOTE — Telephone Encounter (Signed)
Called pt, LVM.   

## 2018-06-05 NOTE — Telephone Encounter (Signed)
Pt returned the nurse calls. Advised pt per PCP. Pt says that she stop taking medication yesterday after the episode of her concern. Pt says for now she is going to take Tylenol as she has since being Dx with migraines. Pt says that she will call in to schedule ov when she is ready to try something else that will help. No call back needed.

## 2018-06-05 NOTE — Telephone Encounter (Signed)
Attempted to call pt on cell phone number provided but no answer at this timeLeft message for pt to return call to the office.

## 2018-06-28 LAB — CUP PACEART REMOTE DEVICE CHECK
Date Time Interrogation Session: 20191028041706
HIGH POWER IMPEDANCE MEASURED VALUE: 50 Ohm
HighPow Impedance: 40 Ohm
Implantable Lead Implant Date: 20081215
Implantable Lead Location: 753860
Implantable Pulse Generator Implant Date: 20190418
Lead Channel Pacing Threshold Pulse Width: 0.4 ms
Lead Channel Sensing Intrinsic Amplitude: 19.25 mV
Lead Channel Sensing Intrinsic Amplitude: 19.25 mV
Lead Channel Setting Sensing Sensitivity: 0.3 mV
MDC IDC MSMT BATTERY REMAINING LONGEVITY: 130 mo
MDC IDC MSMT BATTERY VOLTAGE: 3.04 V
MDC IDC MSMT LEADCHNL RV IMPEDANCE VALUE: 399 Ohm
MDC IDC MSMT LEADCHNL RV IMPEDANCE VALUE: 494 Ohm
MDC IDC MSMT LEADCHNL RV PACING THRESHOLD AMPLITUDE: 0.75 V
MDC IDC SET LEADCHNL RV PACING AMPLITUDE: 2.5 V
MDC IDC SET LEADCHNL RV PACING PULSEWIDTH: 0.4 ms
MDC IDC STAT BRADY RV PERCENT PACED: 0.01 %

## 2018-07-26 ENCOUNTER — Encounter: Payer: Self-pay | Admitting: Internal Medicine

## 2018-07-26 ENCOUNTER — Ambulatory Visit (INDEPENDENT_AMBULATORY_CARE_PROVIDER_SITE_OTHER): Payer: Medicare Other | Admitting: Internal Medicine

## 2018-07-26 VITALS — BP 114/68 | HR 70 | Temp 97.9°F | Ht 67.0 in | Wt 216.0 lb

## 2018-07-26 DIAGNOSIS — G43809 Other migraine, not intractable, without status migrainosus: Secondary | ICD-10-CM

## 2018-07-26 DIAGNOSIS — I5022 Chronic systolic (congestive) heart failure: Secondary | ICD-10-CM

## 2018-07-26 DIAGNOSIS — I10 Essential (primary) hypertension: Secondary | ICD-10-CM

## 2018-07-26 MED ORDER — RIZATRIPTAN BENZOATE 10 MG PO TABS: 10.0000 mg | ORAL_TABLET | ORAL | 5 refills | Status: DC | PRN

## 2018-07-26 NOTE — Assessment & Plan Note (Signed)
stable overall by history and exam, recent data reviewed with pt, and pt to continue medical treatment as before,  to f/u any worsening symptoms or concerns  

## 2018-07-26 NOTE — Patient Instructions (Signed)
OK to stop the topamax as you have  Please take all new medication as prescribed - the rizatriptan as needed  Please continue all other medications as before, and refills have been done if requested.  Please have the pharmacy call with any other refills you may need.  Please continue your efforts at being more active, low cholesterol diet, and weight control.  Please keep your appointments with your specialists as you may have planned
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
## 2018-07-26 NOTE — Assessment & Plan Note (Signed)
I agree side effects likely related to topamax, ok for change to rizatriptan prn,  to f/u any worsening symptoms or concerns

## 2018-08-06 ENCOUNTER — Ambulatory Visit (INDEPENDENT_AMBULATORY_CARE_PROVIDER_SITE_OTHER): Payer: Medicare Other

## 2018-08-06 DIAGNOSIS — I428 Other cardiomyopathies: Secondary | ICD-10-CM

## 2018-08-07 NOTE — Progress Notes (Signed)
Remote ICD transmission.   

## 2018-08-09 LAB — CUP PACEART REMOTE DEVICE CHECK
Brady Statistic RV Percent Paced: 0.01 %
HIGH POWER IMPEDANCE MEASURED VALUE: 41 Ohm
HIGH POWER IMPEDANCE MEASURED VALUE: 50 Ohm
Implantable Lead Implant Date: 20081215
Implantable Pulse Generator Implant Date: 20190418
Lead Channel Impedance Value: 380 Ohm
Lead Channel Pacing Threshold Amplitude: 0.625 V
Lead Channel Pacing Threshold Pulse Width: 0.4 ms
Lead Channel Sensing Intrinsic Amplitude: 21.625 mV
Lead Channel Sensing Intrinsic Amplitude: 21.625 mV
Lead Channel Setting Pacing Amplitude: 2.5 V
MDC IDC LEAD LOCATION: 753860
MDC IDC MSMT BATTERY REMAINING LONGEVITY: 128 mo
MDC IDC MSMT BATTERY VOLTAGE: 3.02 V
MDC IDC MSMT LEADCHNL RV IMPEDANCE VALUE: 494 Ohm
MDC IDC SESS DTM: 20200127122205
MDC IDC SET LEADCHNL RV PACING PULSEWIDTH: 0.4 ms
MDC IDC SET LEADCHNL RV SENSING SENSITIVITY: 0.3 mV

## 2018-08-15 DIAGNOSIS — Z1231 Encounter for screening mammogram for malignant neoplasm of breast: Secondary | ICD-10-CM | POA: Diagnosis not present

## 2018-08-23 DIAGNOSIS — N6315 Unspecified lump in the right breast, overlapping quadrants: Secondary | ICD-10-CM | POA: Diagnosis not present

## 2018-08-23 DIAGNOSIS — N6313 Unspecified lump in the right breast, lower outer quadrant: Secondary | ICD-10-CM | POA: Diagnosis not present

## 2018-08-23 LAB — HM MAMMOGRAPHY

## 2018-08-24 ENCOUNTER — Other Ambulatory Visit: Payer: Medicare Other | Admitting: *Deleted

## 2018-08-24 ENCOUNTER — Telehealth: Payer: Self-pay

## 2018-08-24 DIAGNOSIS — I428 Other cardiomyopathies: Secondary | ICD-10-CM

## 2018-08-24 DIAGNOSIS — I472 Ventricular tachycardia, unspecified: Secondary | ICD-10-CM
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
## 2018-08-25 LAB — BASIC METABOLIC PANEL
BUN/Creatinine Ratio: 12 (ref 12–28)
BUN: 17 mg/dL (ref 8–27)
CO2: 25 mmol/L (ref 20–29)
Calcium: 9.4 mg/dL (ref 8.7–10.3)
Chloride: 99 mmol/L (ref 96–106)
Creatinine, Ser: 1.47 mg/dL — ABNORMAL HIGH (ref 0.57–1.00)
GFR calc Af Amer: 40 mL/min/{1.73_m2} — ABNORMAL LOW (ref >59)
GFR calc non Af Amer: 35 mL/min/{1.73_m2} — ABNORMAL LOW (ref >59)
GLUCOSE: 111 mg/dL — AB (ref 65–99)
Potassium: 4.8 mmol/L (ref 3.5–5.2)
Sodium: 140 mmol/L (ref 134–144)

## 2018-08-25 LAB — MAGNESIUM: Magnesium: 2 mg/dL (ref 1.6–2.3)

## 2018-08-28 ENCOUNTER — Other Ambulatory Visit: Payer: Self-pay | Admitting: Internal Medicine

## 2018-08-28 ENCOUNTER — Encounter: Payer: Medicare Other | Admitting: Internal Medicine

## 2018-08-29 ENCOUNTER — Other Ambulatory Visit: Payer: Self-pay | Admitting: Radiology

## 2018-08-29 DIAGNOSIS — N6313 Unspecified lump in the right breast, lower outer quadrant: Secondary | ICD-10-CM | POA: Diagnosis not present

## 2018-08-29 DIAGNOSIS — C50511 Malignant neoplasm of lower-outer quadrant of right female breast: Secondary | ICD-10-CM | POA: Diagnosis not present

## 2018-08-29 LAB — HM MAMMOGRAPHY

## 2018-08-31 ENCOUNTER — Telehealth: Payer: Self-pay | Admitting: Hematology and Oncology

## 2018-08-31 NOTE — Telephone Encounter (Signed)
Left message for patient to return call in reference to [facility] appointment for 2/26

## 2018-09-03 ENCOUNTER — Other Ambulatory Visit: Payer: Self-pay | Admitting: *Deleted

## 2018-09-03 ENCOUNTER — Encounter: Payer: Self-pay | Admitting: *Deleted

## 2018-09-03 DIAGNOSIS — Z171 Estrogen receptor negative status [ER-]: Principal | ICD-10-CM

## 2018-09-03 DIAGNOSIS — C50511 Malignant neoplasm of lower-outer quadrant of right female breast: Secondary | ICD-10-CM | POA: Insufficient documentation

## 2018-09-04 NOTE — Progress Notes (Signed)
[place] NOTE  Patient Care Team: Biagio Borg, MD as PCP - General Stanford Breed, Denice Bors, MD as Consulting Physician (Cardiology) Deboraha Sprang, MD as Consulting Physician (Cardiology) Rockwell Germany, RN as Oncology Nurse Navigator Mauro Kaufmann, RN as Oncology Nurse Navigator Nicholas Lose, MD as Consulting Physician (Hematology and Oncology) Stark Klein, MD as Consulting Physician (General Surgery) Eppie Gibson, MD as Attending Physician (Radiation Oncology)  CHIEF COMPLAINTS/PURPOSE OF CONSULTATION: Newly diagnosed breast cancer  HISTORY OF PRESENTING ILLNESS:  [name] [name] 78 y.o. 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 The cancer was detected on a routine screening mammogram on 08/15/18 and was not palpable prior to diagnosis. It showed a 1cm mass with an adjacent 7m mass in the right breast with no abnormal lymph nodes. A biopsy on 08/29/18 showed the cancer to be 1.6cm, grade 3, HER2 negative, ER/PR negative, Ki67 80%.  She presents to the clinic today with her three daughters. She denies any family history of breast cancer. She has a defibrillator and was concerned with possible interference with treatment. She will consider participation in the [employer] clinical trial. 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  SUMMARY OF ONCOLOGIC HISTORY:   Malignant neoplasm of lower-outer quadrant of right breast of female, estrogen receptor negative ([facility]   08/29/2018 Initial Diagnosis    Screening mammogram detected 2 adjacent masses right breast 8:30 position LOQ 8 cm from nipple 1.1 cm and 5 mm, combined mass 1.7 cm, axilla negative, biopsy: Grade 3 IDC triple negative, Ki-67 80%, T1CN0 stage 1B clinical stage    09/05/2018 Cancer Staging    Staging form: Breast, AJCC 8th  Edition - Clinical stage from 09/05/2018: Stage IB (cT1c, cN0, cM0, G3, ER-, PR-, HER2-) - Signed by GNicholas Lose MD on 09/05/2018    MEDICAL HISTORY:  Past Medical History:  Diagnosis Date  . ALLERGIC RHINITIS 11/06/2007  . 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 ANEMIA-NOS 06/17/2008  . ANXIETY 02/22/2008  . CARDIOMYOPATHY, PRIMARY, DILATED 12/30/2008  . [place]EASE, 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  . GERD 05/07/2007  . HYPERLIPIDEMIA 05/23/2007  . HYPERTENSION 05/07/2007  . 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 MENOPAUSAL DISORDER 05/06/2008  . 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 OVERACTIVE BLADDER 08/03/2010  . PYELONEPHRITIS, HX OF 05/07/2007  . SYSTOLIC HEART FAILURE, CHRONIC 12/28/2008  . VITAMIN D DEFICIENCY 06/17/2008    SURGICAL HISTORY: Past Surgical History:  Procedure Laterality Date  . ABDOMINAL HYSTERECTOMY    . ICD GENERATOR CHANGEOUT N/A 10/25/2017   Procedure: ICD GENERATOR CHANGEOUT;  Surgeon: KDeboraha Sprang MD;  [facility];  Service: Cardiovascular;  Laterality: N/A;  . LAMINECTOMY      SOCIAL HISTORY: Social History   Socioeconomic History  . Marital status: Widowed    Spouse name: Not on file  . Number of children: 3  . Years of education: Not on file  . Highest education level: Not on file  Occupational History  . Occupation: retired AAmbulance person RETIRED  Social Needs  . Financial resource strain: Not on file  . Food insecurity:    Worry: Not on file    Inability: Not on file  . Transportation needs:    Medical: Not on file    Non-medical: Not on file  Tobacco  Use  . Smoking status: Never Smoker  . Smokeless tobacco: Never Used  Substance and Sexual Activity  . Alcohol use: No  . Drug use: No  . Sexual activity: Never  Lifestyle  . Physical activity:    Days per week: Not on file    Minutes per session: Not on file  . Stress: Not on file  Relationships  . Social connections:    Talks on  phone: Not on file    Gets together: Not on file    Attends religious service: Not on file    Active member of club or organization: Not on file    Attends meetings of clubs or organizations: Not on file    Relationship status: Not on file  . Intimate partner violence:    Fear of current or ex partner: Not on file    Emotionally abused: Not on file    Physically abused: Not on file    Forced sexual activity: Not on file  Other Topics Concern  . Not on file  Social History Narrative  . Not on file    FAMILY HISTORY: Family History  Problem Relation Age of Onset  . Arthritis Other   . Coronary artery disease Other   . Kidney disease Other   . Heart disease Mother     ALLERGIES:  is allergic to topamax [topiramate] and zocor [simvastatin - high dose].  MEDICATIONS:  Current Outpatient Medications  Medication Sig Dispense Refill  . carvedilol (COREG) 25 MG tablet Take 25 mg by mouth 2 (two) times daily with a meal.    . ENTRESTO 24-26 MG TAKE 1 TABLET BY MOUTH 2 (TWO) TIMES DAILY. 180 tablet 3  . spironolactone (ALDACTONE) 25 MG tablet Take 12.5 mg by mouth daily.    . carvedilol (COREG) 25 MG tablet Take 1 tablet (25 mg total) by mouth 2 (two) times daily. 180 tablet 2  . spironolactone (ALDACTONE) 25 MG tablet Take 0.5 tablets (12.5 mg total) by mouth daily. 45 tablet 3   No current facility-administered medications for this visit.     REVIEW OF SYSTEMS:   Constitutional: Denies fevers, chills or abnormal night sweats Eyes: Denies blurriness of vision, double vision or watery eyes Ears, nose, mouth, throat, and face: Denies mucositis or sore throat Respiratory: Denies cough, dyspnea or wheezes Cardiovascular: Denies palpitation, chest discomfort or lower extremity swelling Gastrointestinal:  Denies nausea, heartburn or change in bowel habits Skin: Denies abnormal skin rashes Lymphatics: Denies new lymphadenopathy or easy bruising Neurological:Denies numbness, tingling  or new weaknesses Behavioral/Psych: Mood is stable, no new changes  Breast: Denies any palpable lumps or discharge All other systems were reviewed with the patient and are negative.  PHYSICAL EXAMINATION: ECOG PERFORMANCE STATUS: 1 - Symptomatic but completely ambulatory  Vitals:   09/05/18 0857  BP: 133/75  Pulse: 76  Resp: 16  Temp: 98 F (36.7 C)  SpO2: 100%   Filed Weights   09/05/18 0857  Weight: 213 lb 8 oz (96.8 kg)    GENERAL:alert, no distress and comfortable SKIN: skin color, texture, turgor are normal, no rashes or significant lesions EYES: normal, conjunctiva are pink and non-injected, sclera clear OROPHARYNX: no exudate, no erythema and lips, buccal mucosa, and tongue normal  NECK: supple, thyroid normal size, non-tender, without nodularity LYMPH:  no palpable lymphadenopathy in the cervical, axillary or inguinal LUNGS: clear to auscultation and percussion with normal breathing effort HEART: regular rate & rhythm and no murmurs and no lower extremity edema ABDOMEN:abdomen  soft, non-tender and normal bowel sounds Musculoskeletal:no cyanosis of digits and no clubbing  PSYCH: alert & oriented x 3 with fluent speech NEURO: no focal motor/sensory deficits  LABORATORY DATA:  I have reviewed the data as listed Lab Results  Component Value Date   WBC 7.0 09/05/2018   HGB 11.4 (L) 09/05/2018   HCT 36.5 09/05/2018   MCV 83.1 09/05/2018   PLT 226 09/05/2018   Lab Results  Component Value Date   NA 140 09/05/2018   K 4.2 09/05/2018   CL 104 09/05/2018   CO2 27 09/05/2018    RADIOGRAPHIC STUDIES: I have personally reviewed the radiological reports and agreed with the findings in the report.  ASSESSMENT AND PLAN:  Malignant neoplasm of lower-outer quadrant of right breast of female, estrogen receptor negative ([place]) 08/29/2018:Screening mammogram detected 2 adjacent masses right breast 8:30 position LOQ 8 cm from nipple 1.1 cm and 5 mm, combined mass 1.7 cm,  axilla negative, biopsy: Grade 3 IDC triple negative, Ki-67 80%, T1CN0 stage 1B clinical stage  Pathology and radiology counseling: Discussed with the patient, the details of pathology including the type of breast cancer,the clinical staging, the significance of ER, PR and HER-2/neu receptors and the implications for treatment. After reviewing the pathology in detail, we proceeded to discuss the different treatment options between surgery, radiation, chemotherapy, antiestrogen therapies.  Treatment plan: 1.  Breast conserving surgery with sentinel lymph node biopsy 2.  Adjuvant chemotherapy with CMF 6-8 cycles 3.  Followed by radiation  Chemotherapy Counseling: I discussed the risks and benefits of chemotherapy including the risks of nausea/ vomiting, risk of infection from low WBC count, fatigue due to chemo or anemia, bruising or bleeding due to low platelets, mouth sores, loss/ change in taste and decreased appetite. Liver and kidney function will be monitored through out chemotherapy as abnormalities in liver and kidney function may be a side effect of treatment. Risk of permanent bone marrow dysfunction and leukemia due to chemo were also discussed.  Return to clinic after surgery to discuss the final pathology report and confirm the final treatment plan.     All questions were answered. The patient knows to call the clinic with any problems, questions or concerns.   Nicholas Lose, MD 09/05/2018   Julious Oka Dorshimer, am acting as scribe for Nicholas Lose, MD.  I have reviewed the above documentation for accuracy and completeness, and I agree with the above.

## 2018-09-05 ENCOUNTER — Other Ambulatory Visit: Payer: Self-pay | Admitting: General Surgery

## 2018-09-05 ENCOUNTER — Other Ambulatory Visit: Payer: Self-pay

## 2018-09-05 ENCOUNTER — Inpatient Hospital Stay: Payer: Medicare Other | Attending: Hematology and Oncology | Admitting: Hematology and Oncology

## 2018-09-05 ENCOUNTER — Encounter: Payer: Self-pay | Admitting: Radiation Oncology

## 2018-09-05 ENCOUNTER — Encounter: Payer: Self-pay | Admitting: Physical Therapy

## 2018-09-05 ENCOUNTER — Encounter: Payer: Self-pay | Admitting: Hematology and Oncology

## 2018-09-05 ENCOUNTER — Encounter: Payer: Self-pay | Admitting: *Deleted

## 2018-09-05 ENCOUNTER — Ambulatory Visit: Payer: Medicare Other | Admitting: Internal Medicine

## 2018-09-05 ENCOUNTER — Encounter: Payer: Self-pay | Admitting: Internal Medicine

## 2018-09-05 ENCOUNTER — Ambulatory Visit: Payer: Medicare Other | Attending: General Surgery | Admitting: Physical Therapy

## 2018-09-05 ENCOUNTER — Ambulatory Visit
Admission: RE | Admit: 2018-09-05 | Discharge: 2018-09-05 | Disposition: A | Discharge status: Home / Self Care | Payer: Medicare Other | Source: Ambulatory Visit | Attending: Radiation Oncology | Admitting: Radiation Oncology

## 2018-09-05 ENCOUNTER — Inpatient Hospital Stay: Payer: Medicare Other

## 2018-09-05 VITALS — BP 110/64 | HR 78 | Ht 67.0 in | Wt 217.4 lb

## 2018-09-05 DIAGNOSIS — I429 Cardiomyopathy, unspecified: Secondary | ICD-10-CM | POA: Diagnosis not present

## 2018-09-05 DIAGNOSIS — Z171 Estrogen receptor negative status [ER-]: Secondary | ICD-10-CM | POA: Insufficient documentation

## 2018-09-05 DIAGNOSIS — F419 Anxiety disorder, unspecified: Secondary | ICD-10-CM

## 2018-09-05 DIAGNOSIS — I110 Hypertensive heart disease with heart failure: Secondary | ICD-10-CM

## 2018-09-05 DIAGNOSIS — I493 Ventricular premature depolarization: Secondary | ICD-10-CM | POA: Diagnosis not present

## 2018-09-05 DIAGNOSIS — E785 Hyperlipidemia, unspecified: Secondary | ICD-10-CM

## 2018-09-05 DIAGNOSIS — Z79899 Other long term (current) drug therapy: Secondary | ICD-10-CM

## 2018-09-05 DIAGNOSIS — C50511 Malignant neoplasm of lower-outer quadrant of right female breast: Secondary | ICD-10-CM

## 2018-09-05 DIAGNOSIS — R293 Abnormal posture: Secondary | ICD-10-CM | POA: Insufficient documentation

## 2018-09-05 DIAGNOSIS — I428 Other cardiomyopathies: Secondary | ICD-10-CM | POA: Diagnosis not present

## 2018-09-05 DIAGNOSIS — Z95810 Presence of automatic (implantable) cardiac defibrillator: Secondary | ICD-10-CM | POA: Diagnosis not present

## 2018-09-05 DIAGNOSIS — E559 Vitamin D deficiency, unspecified: Secondary | ICD-10-CM | POA: Diagnosis not present

## 2018-09-05 DIAGNOSIS — M5136 Other intervertebral disc degeneration, lumbar region: Secondary | ICD-10-CM | POA: Diagnosis not present

## 2018-09-05 DIAGNOSIS — I5022 Chronic systolic (congestive) heart failure: Secondary | ICD-10-CM | POA: Diagnosis not present

## 2018-09-05 DIAGNOSIS — K219 Gastro-esophageal reflux disease without esophagitis: Secondary | ICD-10-CM | POA: Diagnosis not present

## 2018-09-05 LAB — CMP (CANCER CENTER ONLY)
ALT: 7 U/L (ref 0–44)
AST: 12 U/L — ABNORMAL LOW (ref 15–41)
Albumin: 3.6 g/dL (ref 3.5–5.0)
Alkaline Phosphatase: 85 U/L (ref 38–126)
Anion gap: 9 (ref 5–15)
BUN: 16 mg/dL (ref 8–23)
CO2: 27 mmol/L (ref 22–32)
Calcium: 8.9 mg/dL (ref 8.9–10.3)
Chloride: 104 mmol/L (ref 98–111)
Creatinine: 1.44 mg/dL — ABNORMAL HIGH (ref 0.44–1.00)
GFR, Est AFR Am: 41 mL/min — ABNORMAL LOW (ref >60)
GFR, Estimated: 35 mL/min — ABNORMAL LOW (ref >60)
Glucose, Bld: 95 mg/dL (ref 70–99)
POTASSIUM: 4.2 mmol/L (ref 3.5–5.1)
Sodium: 140 mmol/L (ref 135–145)
Total Bilirubin: 0.4 mg/dL (ref 0.3–1.2)
Total Protein: 7.4 g/dL (ref 6.5–8.1)

## 2018-09-05 LAB — CBC WITH DIFFERENTIAL (CANCER CENTER ONLY)
Abs Immature Granulocytes: 0.01 10*3/uL (ref 0.00–0.07)
Basophils Absolute: 0 10*3/uL (ref 0.0–0.1)
Basophils Relative: 1 %
Eosinophils Absolute: 0.1 10*3/uL (ref 0.0–0.5)
Eosinophils Relative: 2 %
HEMATOCRIT: 36.5 % (ref 36.0–46.0)
Hemoglobin: 11.4 g/dL — ABNORMAL LOW (ref 12.0–15.0)
Immature Granulocytes: 0 %
LYMPHS ABS: 3.5 10*3/uL (ref 0.7–4.0)
Lymphocytes Relative: 49 %
MCH: 26 pg (ref 26.0–34.0)
MCHC: 31.2 g/dL (ref 30.0–36.0)
MCV: 83.1 fL (ref 80.0–100.0)
MONOS PCT: 7 %
Monocytes Absolute: 0.5 10*3/uL (ref 0.1–1.0)
Neutro Abs: 2.9 10*3/uL (ref 1.7–7.7)
Neutrophils Relative %: 41 %
PLATELETS: 226 10*3/uL (ref 150–400)
RBC: 4.39 MIL/uL (ref 3.87–5.11)
RDW: 15.8 % — AB (ref 11.5–15.5)
WBC Count: 7 10*3/uL (ref 4.0–10.5)
nRBC: 0 % (ref 0.0–0.2)

## 2018-09-05 NOTE — Patient Instructions (Signed)

## 2018-09-05 NOTE — Progress Notes (Signed)
Nutrition Assessment  Reason for Assessment:  Pt seen in Breast Clinic  ASSESSMENT:   78 year old female with breast cancer.  Past medical history reviewed.  Patient reports normal appetite. Daughters reports that patient does not like many vegetables and concerned about her nutrition.   Medications:  reviewed  Labs: reviewed  Anthropometrics:   Height: 67 inches Weight: 213 lb 8 oz BMI: 33   NUTRITION DIAGNOSIS: Food and nutrition related knowledge deficit related to new diagnosis of breast cancer as evidenced by no prior need for nutrition related information.  INTERVENTION:   Discussed and provided packet of information regarding nutritional tips for breast cancer patients.  Questions answered.  Teachback method used.  Contact information provided and patient knows to contact me with questions/concerns.    MONITORING, EVALUATION, and GOAL: Pt will consume a healthy plant based diet to maintain lean body mass throughout treatment.   Keniesha Adderly B. Zenia Resides, [place], [place] Registered Dietitian [phone] (pager)

## 2018-09-05 NOTE — Progress Notes (Signed)
Radiation Oncology         [phone] ________________________________  Initial outpatient Consultation  Name: [name] [name] MRN: [id number]  Date: 09/05/2018  DOB: [birth year]  MA:UQJF, Hunt Oris, MD  Stark Klein, MD   REFERRING PHYSICIAN: Stark Klein, MD  DIAGNOSIS:    ICD-10-CM   1. Malignant neoplasm of lower-outer quadrant of right breast of female, estrogen receptor negative ([place]) C50.511    Z17.1      Cancer Staging Malignant neoplasm of lower-outer quadrant of right breast of female, estrogen receptor negative ([place]) Staging form: Breast, AJCC 8th Edition - Clinical stage from 09/05/2018: Stage IB (cT1c, cN0, cM0, G3, ER-, PR-, HER2-) - Signed by Nicholas Lose, MD on 09/05/2018  CHIEF COMPLAINT: Here to discuss management of right breast cancer  HISTORY OF PRESENT ILLNESS::[name] [name] is a 78 y.o. female who presented with breast abnormality on screening mammography, in the right breast.  On [place] there was a 1.6cm mass at 8:30 and the right axilla was negative on [place].   Biopsy showed Grade 3 Invasive ductal carcinoma, triple negative.  Review of systems is positive for fatigue, sore throat, irregular heartbeat, palpitations, chest pain, poor circulation, SOB, incontinence of urine, lump in breast since biopsy, back pain, arthritis, HA.  She is in her USOH, however.  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    PAST SURGICAL HISTORY: Past Surgical History:  Procedure Laterality Date  . ABDOMINAL HYSTERECTOMY    . ICD GENERATOR CHANGEOUT N/A 10/25/2017   Procedure: ICD GENERATOR CHANGEOUT;  Surgeon: Deboraha Sprang, MD;  Location: [facility];  Service: Cardiovascular;  Laterality: N/A;  . LAMINECTOMY      FAMILY HISTORY: family history includes Arthritis in an other family member; Coronary artery disease in an other family member; Heart disease in her mother; Kidney disease in an other family member.  SOCIAL HISTORY:  reports that she has never smoked. She has never used smokeless tobacco. She reports that she does not drink alcohol or use drugs.  ALLERGIES: Topamax [topiramate] and Zocor [simvastatin - high dose]  MEDICATIONS:  Current Outpatient Medications  Medication Sig Dispense Refill  . carvedilol (COREG) 25 MG tablet Take 1 tablet (25 mg total) by mouth 2 (two) times daily. 180 tablet 2  . carvedilol (COREG) 25 MG tablet Take 25 mg by mouth 2 (two) times daily with a meal.    . ENTRESTO 24-26 MG TAKE 1 TABLET BY MOUTH 2 (TWO) TIMES DAILY. 180 tablet 3  . spironolactone (ALDACTONE) 25 MG tablet Take 0.5 tablets (12.5 mg total) by mouth daily. 45 tablet 3  . spironolactone (ALDACTONE) 25 MG tablet Take 12.5 mg by mouth daily.     No current facility-administered medications for this encounter.     REVIEW OF SYSTEMS: A 10+ POINT REVIEW OF SYSTEMS WAS OBTAINED including neurology, dermatology, psychiatry, cardiac, respiratory, lymph, extremities, GI, GU, Musculoskeletal, constitutional, breasts, reproductive, HEENT.  All pertinent positives are noted in the HPI.  All others are negative.   PHYSICAL EXAM:  Vitals with BMI 09/05/2018  Height 5' 7"   Weight 213 lbs 8 oz  BMI 19.16  Systolic 606  Diastolic 75  Pulse 76  Respirations 16   General: Alert and oriented, in no acute distress HEENT: Head is normocephalic. Extraocular movements are intact.  Oropharynx is clear. Neck: Neck is supple, no palpable cervical or supraclavicular lymphadenopathy. Heart: Regular in rate and rhythm with no murmurs, rubs, or gallops. Chest: Clear to auscultation bilaterally, with no rhonchi, wheezes, or rales. Cardiac device in left upper chest is palpated. Abdomen: Soft, nontender, nondistended, with no rigidity or guarding. Extremities: No cyanosis or edema. Lymphatics: see Neck Exam Skin: No concerning lesions. Musculoskeletal: symmetric strength and muscle tone throughout. Neurologic: Cranial nerves II through XII are grossly intact. No obvious focalities. Speech is fluent. Coordination is intact. Psychiatric: Judgment and insight are intact. Affect is appropriate. Breasts: a 3cm mass with likely post biopsy changes is in the 8:00 region of the right breast . No other palpable masses appreciated in the breasts or axillae bilaterally .   ECOG = 1  0 - Asymptomatic (Fully active, able to carry on all predisease activities without restriction)  1 - Symptomatic but completely ambulatory (Restricted in physically strenuous activity but ambulatory and able to carry out work of a light or sedentary nature. For example, light housework, office work)  2 - Symptomatic, <50% in bed during the day (Ambulatory and capable of all self care but unable to carry out any work activities. Up and about more than 50% of waking hours)  3 - Symptomatic, >50% in bed, but not bedbound (Capable of only limited self-care, confined to bed or chair 50% or more of waking hours)  4 - Bedbound (Completely disabled. Cannot carry on any self-care. Totally confined to bed or chair)  5 - Death   Eustace Pen MM, Creech RH, Tormey DC, et al. [phone]). "Toxicity and response criteria of the [facility]". [place] Oncol. 5 (6): 649-55   LABORATORY DATA:  Lab Results  Component Value Date   WBC 7.0 09/05/2018   HGB 11.4 (L) 09/05/2018   HCT 36.5 09/05/2018   MCV  83.1 09/05/2018   PLT 226 09/05/2018   CMP     Component Value Date/Time   NA 140 09/05/2018 0832   NA 140 08/24/2018 1418   K 4.2 09/05/2018 0832   CL 104 09/05/2018 0832   CO2 27 09/05/2018 0832   GLUCOSE 95 09/05/2018 0832   BUN 16 09/05/2018 0832   BUN 17 08/24/2018 1418   CREATININE 1.44 (H) 09/05/2018 0832   CREATININE 1.54 (H) 12/11/2015 1001   CALCIUM 8.9 09/05/2018 0832   PROT 7.4 09/05/2018 0832   ALBUMIN 3.6 09/05/2018 0832   AST 12 (L) 09/05/2018 0832   ALT 7 09/05/2018 0832   ALKPHOS 85 09/05/2018 0832   BILITOT 0.4 09/05/2018 0832   GFRNONAA 35 (L) 09/05/2018 0832   GFRAA 41 (L) 09/05/2018 9977        RADIOGRAPHY: as above     IMPRESSION/PLAN: Right breast cancer.  She has been discussed at our multidisciplinary tumor board.  The consensus is that she would be a good candidate for breast conservation. I talked to her about the option of a mastectomy and informed her that her expected overall survival would be equivalent between mastectomy and breast conservation, based upon randomized controlled data. She is enthusiastic about breast conservation.  She anticipates adjuvant CMF chemotherapy.  It was a pleasure meeting the patient today. We discussed the risks, benefits, and side effects of radiotherapy. I recommend radiotherapy to  the right breast to reduce her risk of locoregional recurrence by 2/3.  We discussed that radiation would take approximately 5-6 weeks to complete and that radiotherapy will overlap with chemotherapy.  We spoke about acute effects including skin irritation and fatigue as well as much less common late effects including internal organ injury or irritation. We spoke about the latest technology that is used to minimize the risk of late effects for patients undergoing radiotherapy to the breast or chest wall. No guarantees of treatment were given. The patient is enthusiastic about proceeding with treatment. I look forward to participating in  the patient's care.  I will await her referral back to me for  eventual CT simulation/treatment planning.   __________________________________________   Eppie Gibson, MD

## 2018-09-05 NOTE — Therapy (Signed)
[place], [place], [address] Phone: [phone]   Fax:  [phone]  Physical Therapy Evaluation  Patient Details  Name: [name] [name] MRN: [id number] Date of Birth: 1941/01/17 Referring Provider (PT): Dr. Stark Klein   Encounter Date: 09/05/2018  PT End of Session - 09/05/18 1009    Visit Number  1    Number of Visits  2    Date for PT Re-Evaluation  10/31/18    PT Start Time  1025    PT Stop Time  1058    PT Time Calculation (min)  33 min    Activity Tolerance  Patient tolerated treatment well    Behavior During Therapy  [facility] for tasks assessed/performed       Past Medical History:  Diagnosis Date  . ALLERGIC RHINITIS 11/06/2007  . 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 ANEMIA-NOS 06/17/2008  . ANXIETY 02/22/2008  . CARDIOMYOPATHY, PRIMARY, DILATED 12/30/2008  . [place] DISEASE, 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  . GERD 05/07/2007  . HYPERLIPIDEMIA 05/23/2007  . HYPERTENSION 05/07/2007  . 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 MENOPAUSAL DISORDER 05/06/2008  . 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 OVERACTIVE BLADDER 08/03/2010  . PYELONEPHRITIS, HX OF 05/07/2007  . SYSTOLIC HEART FAILURE, CHRONIC 12/28/2008  . VITAMIN D DEFICIENCY 06/17/2008    Past Surgical History:  Procedure Laterality Date  . ABDOMINAL HYSTERECTOMY    . ICD GENERATOR CHANGEOUT N/A 10/25/2017   Procedure: ICD GENERATOR CHANGEOUT;  Surgeon: Deboraha Sprang, MD;  Location: [place] [facility];  Service: Cardiovascular;  Laterality: N/A;  . LAMINECTOMY      There were no vitals filed for this visit.   Subjective Assessment - 09/05/18 1003    Subjective  Patient reports she is here today to be seen by her medical team for her newly diagnosed right breast cancer.    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 It measures 1.6 cm and is  located in the lower outer quadrant with a Ki67 of 80%. She has a cardiac defibrillator.    Patient Stated Goals  Reduce lymphedema risk and learn post op shoulder ROM HEP    Currently in Pain?  No/denies         [facility] PT Assessment - 09/05/18 0001      Assessment   Medical Diagnosis  Right breast cancer    Referring Provider (PT)  Dr. Stark Klein    Onset Date/Surgical Date  08/15/18    Hand Dominance  Right    Prior Therapy  none      Precautions   Precautions  Other (comment)    Precaution Comments  active cancer; cardiac defibrillator      Restrictions   Weight Bearing Restrictions  No      Balance Screen   Has the patient fallen in the past 6 months  No    Has the patient had a decrease in activity level because of a fear of falling?   No    Is the patient reluctant to leave their home because of a fear of falling?   No      Home Social worker  Private residence    Living Arrangements  Alone    Available Help at Discharge  Family      Prior Function   Level of [place]  Retired  Leisure  She walks on a treadmill 10 minutes at a time but infrequently      Cognition   Overall Cognitive Status  Within Functional Limits for tasks assessed      Posture/Postural Control   Posture/Postural Control  Postural limitations    Postural Limitations  Rounded Shoulders;Forward head      ROM / Strength   AROM / PROM / Strength  AROM;Strength      AROM   AROM Assessment Site  Shoulder;Cervical    Right/Left Shoulder  Right;Left    Right Shoulder Extension  53 Degrees    Right Shoulder Flexion  137 Degrees    Right Shoulder ABduction  134 Degrees    Right Shoulder Internal Rotation  46 Degrees   Some c/o pain   Right Shoulder External Rotation  66 Degrees   Some c/o pain   Left Shoulder Extension  58 Degrees    Left Shoulder Flexion  123 Degrees    Left Shoulder ABduction  117 Degrees   Limited by pt being cautious of  defibrillator wires in chest   Left Shoulder Internal Rotation  65 Degrees    Left Shoulder External Rotation  67 Degrees    Cervical Flexion  WNL    Cervical Extension  WNL    Cervical - Right Side Bend  WNL    Cervical - Left Side Bend  25% limited    Cervical - Right Rotation  WNL    Cervical - Left Rotation  25% limited      Strength   Overall Strength  Within functional limits for tasks performed        LYMPHEDEMA/ONCOLOGY QUESTIONNAIRE - 09/05/18 1007      Type   Cancer Type  Right breast cancer      Lymphedema Assessments   Lymphedema Assessments  Upper extremities      Right Upper Extremity Lymphedema   10 cm Proximal to Olecranon Process  32.8 cm    Olecranon Process  26.6 cm    10 cm Proximal to Ulnar Styloid Process  22.2 cm    Just Proximal to Ulnar Styloid Process  16.8 cm    Across Hand at [employer]  21.1 cm    At [place] of 2nd Digit  7.3 cm      Left Upper Extremity Lymphedema   10 cm Proximal to Olecranon Process  32.9 cm    Olecranon Process  27.6 cm    10 cm Proximal to Ulnar Styloid Process  21.2 cm    Just Proximal to Ulnar Styloid Process  16.3 cm    Across Hand at [employer]  20.7 cm    At [place] of 2nd Digit  6.8 cm          Quick Dash - 09/05/18 0001    Open a tight or new jar  Mild difficulty    Do heavy household chores (wash walls, wash floors)  No difficulty    Carry a shopping bag or briefcase  No difficulty    Wash your back  Mild difficulty    Use a knife to cut food  No difficulty    Recreational activities in which you take some force or impact through your arm, shoulder, or hand (golf, hammering, tennis)  Mild difficulty    During the past week, to what extent has your arm, shoulder or hand problem interfered with your normal social activities with family, friends, neighbors, or groups?  Not at all  During the past week, to what extent has your arm, shoulder or hand problem limited your work or other regular daily  activities  Slightly    Arm, shoulder, or hand pain.  Mild    Tingling (pins and needles) in your arm, shoulder, or hand  None    Difficulty Sleeping  Moderate difficulty    DASH Score  15.91 %        Objective measurements completed on examination: See above findings.      Patient was instructed today in a home exercise program today for post op shoulder range of motion. These included active assist shoulder flexion in sitting, scapular retraction, wall walking with shoulder abduction, and hands behind head external rotation.  She was encouraged to do these twice a day, holding 3 seconds and repeating 5 times when permitted by her physician.      PT Education - 09/05/18 1008    Education Details  Lymphedema risk reduction and post op shoulder ROM HEP    Person(s) Educated  Patient;Child(ren)    Methods  Explanation;Demonstration;Handout    Comprehension  Returned demonstration;Verbalized understanding          PT Long Term Goals - 09/05/18 1013      PT LONG TERM GOAL #1   Title  Patient will demonstrate she has regained full shoulder ROM and function post operatively compared to baselines.    Time  8    Period  Weeks    Status  New      Breast Clinic Goals - 09/05/18 1013      Patient will be able to verbalize understanding of pertinent lymphedema risk reduction practices relevant to her diagnosis specifically related to skin care.   Time  1    Period  Days    Status  Achieved      Patient will be able to return demonstrate and/or verbalize understanding of the post-op home exercise program related to regaining shoulder range of motion.   Time  1    Period  Days    Status  Achieved      Patient will be able to verbalize understanding of the importance of attending the postoperative After Breast Cancer Class for further lymphedema risk reduction education and therapeutic exercise.   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 It measures 1.6 cm and is located in the lower outer quadrant with a Ki67 of 80%. She has a cardiac defibrillator. Her multidisciplinary medical team met prior to her assessments to determine a recommended treatment plan. She is planning to have a right lumpectomy and sentinel node biopsy followed by chemotherapy and radiation. She will benefit from post op PT to regain shoulder ROM and be reassessed to determine needs.    History and Personal Factors relevant to plan of care:  Lives alone; has defibrillator    Clinical Presentation  Stable    Clinical Presentation due to:  Stable condition    Clinical Decision Making  Low    Rehab Potential  Excellent    Clinical Impairments Affecting Rehab Potential  None    PT Frequency  --   Eval and 1 f/u visit   PT Treatment/Interventions  ADLs/Self Care Home Management;Patient/family education;Therapeutic exercise  PT Next Visit Plan  Will reassess 3-4 weeks post op to determine needs    PT Home Exercise Plan  Post op shoulder ROM HEP    Consulted and Agree with Plan of Care  Patient;Family member/caregiver    Family Member Consulted  daughters       Patient will benefit from skilled therapeutic intervention in order to improve the following deficits and impairments:  Decreased range of motion, Impaired UE functional use, Decreased knowledge of precautions, Postural dysfunction, Pain  Visit Diagnosis: Malignant neoplasm of lower-outer quadrant of right breast of female, estrogen receptor negative ([place]) - Plan: PT plan of care cert/re-cert  Abnormal posture - Plan: PT plan of care cert/re-cert   Patient will follow up at outpatient cancer rehab 3-4 weeks following surgery.  If the patient requires physical therapy at that time, a specific plan will be dictated and sent to the referring physician for  approval. The patient was educated today on appropriate basic range of motion exercises to begin post operatively and the importance of attending the After Breast Cancer class following surgery.  Patient was educated today on lymphedema risk reduction practices as it pertains to recommendations that will benefit the patient immediately following surgery.  She verbalized good understanding.     Problem List Patient Active Problem List   Diagnosis Date Noted  . Malignant neoplasm of lower-outer quadrant of right breast of female, estrogen receptor negative ([place]) 09/03/2018  . Wheezing 10/03/2017  . Left lumbar radiculopathy 05/16/2017  . Rotator cuff tear, right 03/23/2017  . AC joint arthropathy 02/25/2017  . Fatigue 05/12/2016  . Migraine 10/07/2015  . PVC's (premature ventricular contractions) 07/23/2014  . Obesity 02/10/2013  . Right lumbar radiculopathy 09/24/2012  . Hearing loss of right ear 08/10/2012  . Automatic implantable cardioverter-defibrillator . Medtronic 11/07/2011  . Preventative health care 12/29/2010  . Gross hematuria 12/29/2010  . Unspecified eustachian tube disorder 08/03/2010  . OVERACTIVE BLADDER 08/03/2010  . 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 Cardiomyopathy, nonischemic ([place]) 12/30/2008  . SYSTOLIC HEART FAILURE, CHRONIC 12/28/2008  . VITAMIN D DEFICIENCY 06/17/2008  . ANEMIA-NOS 06/17/2008  . MENOPAUSAL DISORDER 05/06/2008  . ANXIETY 02/22/2008  . Allergic rhinitis 11/06/2007  . Hyperlipidemia 05/23/2007  . 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 Osteoarthrosis involving lower leg 05/23/2007  . Essential hypertension 05/07/2007  . 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 GERD 05/07/2007  . [place] DISEASE, 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  . PYELONEPHRITIS, HX OF 05/07/2007  . HYSTERECTOMY, HX OF 05/07/2007  . LAMINECTOMY, HX OF 05/07/2007   Annia Friendly, PT 09/05/18 11:18 AM  [place], [place], [address] Phone: [phone]   Fax:  [phone]  Name: [name] [name] MRN: [id number] Date of Birth: April 20, 1941

## 2018-09-05 NOTE — Patient Instructions (Signed)
Medication Instructions:  Your physician recommends that you continue on your current medications as directed. Please refer to the Current Medication list given to you today.  Labwork: None ordered.  Testing/Procedures: Your physician has requested that you have an echocardiogram. Echocardiography is a painless test that uses sound waves to create images of your heart. It provides your doctor with information about the size and shape of your heart and how well your heart's chambers and valves are working. This procedure takes approximately one hour. There are no restrictions for this procedure.   Follow-Up: Your physician recommends that you schedule a follow-up appointment in:   One Year with Dr Caryl Comes  Any Other Special Instructions Will Be Listed Below (If Applicable).     If you need a refill on your cardiac medications before your next appointment, please call your pharmacy.

## 2018-09-05 NOTE — Progress Notes (Signed)
Patient Care Team: Biagio Borg, MD as PCP - General Stanford Breed, Denice Bors, MD as Consulting Physician (Cardiology) Deboraha Sprang, MD as Consulting Physician (Cardiology) Rockwell Germany, RN as Oncology Nurse Navigator Mauro Kaufmann, RN as Oncology Nurse Navigator Nicholas Lose, MD as Consulting Physician (Hematology and Oncology) Stark Caitlen Worth, MD as Consulting Physician (General Surgery) Eppie Gibson, MD as Attending Physician (Radiation Oncology)   HPI  [name] [name] is a 78 y.o. female seen in followup for an ICD implanted 2008 for primary prevention in the setting of nonischemic heart disease. She has chronic systolic heart failure with a relatively narrow QRS and underwent single-chamber defibrillator implantation.   She underwent appropriate device therapy for polymorphic VT/VF 2/20 with successful conversion with shock  DATE TEST EF   11/08 MYOVIEW  34 %   3/15 Echo   15-20 %   10/18 Echo  25%    Date PVCs  11/15 11%          Re PVCs-- I asked GT to consider catheter ablation. Her symptoms seem to be improved and her PVC burden by ECG had diminished and so medical therapy was recommended.   Date Cr K Hgb  4/19 1.38 4.4 10.8  2/20  1.44 4.2 11.4   Interval diagnosis of breast cancer.  Triple negative.  Scheduled for lumpectomy, radiation and chemotherapy.  Past Medical History:  Diagnosis Date  . ALLERGIC RHINITIS 11/06/2007  . 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 ANEMIA-NOS 06/17/2008  . ANXIETY 02/22/2008  . CARDIOMYOPATHY, PRIMARY, DILATED 12/30/2008  . [place] DISEASE, 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  . GERD 05/07/2007  . HYPERLIPIDEMIA 05/23/2007  . HYPERTENSION 05/07/2007  . 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 MENOPAUSAL DISORDER 05/06/2008  . 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 OVERACTIVE BLADDER 08/03/2010  . PYELONEPHRITIS, HX OF 05/07/2007  . SYSTOLIC HEART FAILURE, CHRONIC 12/28/2008  . VITAMIN D DEFICIENCY 06/17/2008    Past Surgical  History:  Procedure Laterality Date  . ABDOMINAL HYSTERECTOMY    . ICD GENERATOR CHANGEOUT N/A 10/25/2017   Procedure: ICD GENERATOR CHANGEOUT;  Surgeon: Deboraha Sprang, MD;  Location: [facility];  Service: Cardiovascular;  Laterality: N/A;  . LAMINECTOMY      Current Outpatient Medications  Medication Sig Dispense Refill  . carvedilol (COREG) 25 MG tablet Take 12.5 mg by mouth 2 (two) times daily with a meal.    . ENTRESTO 24-26 MG TAKE 1 TABLET BY MOUTH 2 (TWO) TIMES DAILY. 180 tablet 3  . furosemide (LASIX) 40 MG tablet Take 40 mg by mouth 2 (two) times daily.    [name] spironolactone (ALDACTONE) 25 MG tablet Take 0.5 tablets (12.5 mg total) by mouth daily. 45 tablet 3   No current facility-administered medications for this visit.     Allergies  Allergen Reactions  . Topamax [Topiramate] Other (See Comments)    Confusion, gets lost going home  . Zocor [Simvastatin - High Dose] Nausea Only    Nausea     Review of Systems negative except from HPI and PMH  Physical Exam BP 110/64   Pulse 78   Ht 5\' 7"  (1.702 m)   Wt 217 lb 6.4 oz (98.6 kg)   SpO2 99%   BMI 34.05 kg/m  Well developed and nourished in no acute distress HENT normal Neck supple with JVP-flat Clear Device pocket well healed; without hematoma or erythema.  There is no tethering Regular rate and rhythm, no murmurs or gallops Abd-soft with active BS  No Clubbing cyanosis edema Skin-warm and dry A & Oriented  Grossly normal sensory and motor function    ECG sinus at 78 Interval 17/11/38 LVH with re-pol  Assessment and  Plan  Nonischemic cardiomyopathy   PVCs  Implantable defibrillator-Medtronic  The patient's device was interrogated.  The information was reviewed. No changes were made in the programming.     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   Electrolytes were okay.  Breast cancer.  Therapy coming. pwp  We spent more than 50% of our >25 min visit in face to face counseling regarding the above

## 2018-09-05 NOTE — Progress Notes (Signed)
Clinical Social Work [place] Psychosocial Distress Screening [place]  Patient completed distress screening protocol and scored a 8 on the Psychosocial Distress Thermometer which indicates moderate distress. Clinical Social Worker met with patient and patients family in [facility] to assess for distress and other psychosocial needs. Patient stated she was feeling overwhelmed but felt "better" after meeting with the treatment team and getting more information on her treatment plan. CSW and patient discussed common feeling and emotions when being diagnosed with cancer, and the importance of support during treatment. CSW informed patient of the support team and support services at [facility], and patient was agreeable to an [employer] referral. CSW provided contact information and encouraged patient to call with any questions or concerns.  ONCBCN DISTRESS SCREENING 09/05/2018  Screening Type Initial Screening  Distress experienced in past week (1-10) 8  Emotional problem type Nervousness/Anxiety;Isolation/feeling alone;Adjusting to illness  Information Concerns Type Lack of info about diagnosis;Lack of info about treatment  Referral to support programs Yes     Johnnye Lana, MSW, LCSW, OSW-C Clinical Social Worker [facility] [phone]

## 2018-09-05 NOTE — Assessment & Plan Note (Addendum)
08/29/2018:Screening mammogram detected 2 adjacent masses right breast 8:30 position LOQ 8 cm from nipple 1.1 cm and 5 mm, combined mass 1.7 cm, axilla negative, biopsy: Grade 3 IDC triple negative, Ki-67 80%, T1CN0 stage 1B clinical stage  Pathology and radiology counseling: Discussed with the patient, the details of pathology including the type of breast cancer,the clinical staging, the significance of ER, PR and HER-2/neu receptors and the implications for treatment. After reviewing the pathology in detail, we proceeded to discuss the different treatment options between surgery, radiation, chemotherapy, antiestrogen therapies.  Treatment plan: 1.  Breast conserving surgery with sentinel lymph node biopsy 2.  Adjuvant chemotherapy with CMF 6-8 cycles 3.  Followed by radiation  Chemotherapy Counseling: I discussed the risks and benefits of chemotherapy including the risks of nausea/ vomiting, risk of infection from low WBC count, fatigue due to chemo or anemia, bruising or bleeding due to low platelets, mouth sores, loss/ change in taste and decreased appetite. Liver and kidney function will be monitored through out chemotherapy as abnormalities in liver and kidney function may be a side effect of treatment. Risk of permanent bone marrow dysfunction and leukemia due to chemo were also discussed.  Return to clinic after surgery to discuss the final pathology report and confirm the final treatment plan.

## 2018-09-06 LAB — CUP PACEART INCLINIC DEVICE CHECK
Battery Remaining Longevity: 127 mo
Battery Voltage: 3.01 V
Brady Statistic RV Percent Paced: 0.01 %
Date Time Interrogation Session: 20200226205609
HighPow Impedance: 40 Ohm
HighPow Impedance: 50 Ohm
Implantable Lead Implant Date: 20081215
Implantable Lead Model: 6947
Implantable Pulse Generator Implant Date: 20190418
Lead Channel Impedance Value: 380 Ohm
Lead Channel Impedance Value: 494 Ohm
Lead Channel Pacing Threshold Amplitude: 0.75 V
Lead Channel Sensing Intrinsic Amplitude: 22.875 mV
Lead Channel Sensing Intrinsic Amplitude: 25.375 mV
Lead Channel Setting Pacing Amplitude: 2.5 V
Lead Channel Setting Pacing Pulse Width: 0.4 ms
MDC IDC LEAD LOCATION: 753860
MDC IDC MSMT LEADCHNL RV PACING THRESHOLD PULSEWIDTH: 0.4 ms
MDC IDC SET LEADCHNL RV SENSING SENSITIVITY: 0.3 mV

## 2018-09-07 ENCOUNTER — Telehealth: Payer: Self-pay | Admitting: Pharmacist Clinician (PhC)/ Clinical Pharmacy Specialist

## 2018-09-07 ENCOUNTER — Telehealth: Payer: Self-pay | Admitting: Hematology and Oncology

## 2018-09-07 NOTE — Telephone Encounter (Signed)
Error

## 2018-09-07 NOTE — Telephone Encounter (Signed)
Scheduled appt per 2/28 sch message - left message and sent reminder letter in the mail.

## 2018-09-11 ENCOUNTER — Telehealth: Payer: Self-pay | Admitting: Hematology and Oncology

## 2018-09-11 ENCOUNTER — Telehealth: Payer: Self-pay | Admitting: *Deleted

## 2018-09-11 NOTE — Telephone Encounter (Signed)
  Oncology Nurse Navigator Documentation  Navigator Location: [facility] (09/11/18 1000)   )Navigator Encounter Type: Telephone (09/11/18 1000) Telephone: Outgoing Call;Clinic/[facility] Follow-up (09/11/18 1000)     Surgery Date: 10/11/18 (09/11/18 1000)           Treatment Initiated Date: 10/11/18 (09/11/18 1000)                                Time Spent with Patient: 15 (09/11/18 1000)

## 2018-09-11 NOTE — Telephone Encounter (Signed)
R/s appt per 3/03 sch message - pt is aware of new appts.

## 2018-09-14 ENCOUNTER — Ambulatory Visit (HOSPITAL_COMMUNITY): Payer: Medicare Other | Attending: Internal Medicine

## 2018-09-14 DIAGNOSIS — Z95810 Presence of automatic (implantable) cardiac defibrillator: Secondary | ICD-10-CM | POA: Diagnosis not present

## 2018-09-14 DIAGNOSIS — I428 Other cardiomyopathies: Secondary | ICD-10-CM | POA: Insufficient documentation

## 2018-09-14 DIAGNOSIS — I5022 Chronic systolic (congestive) heart failure: Secondary | ICD-10-CM | POA: Diagnosis not present

## 2018-09-14 DIAGNOSIS — I493 Ventricular premature depolarization: Secondary | ICD-10-CM | POA: Insufficient documentation

## 2018-10-08 NOTE — Pre-Procedure Instructions (Signed)
NITHILA SUMNERS  10/08/2018      [id number] - [address] [place] [address] Phone: [phone] Fax: [phone]  [facility] [address], [place] - [place] [address] Phone: [phone] Fax: [phone]  [place], [place] [address] [place] [address] Phone: [phone] Fax: [phone]    Your procedure is scheduled on Thursday April 2nd.  Report to [facility] Admitting Entrance "A" at 5:30 A.M.  Call this number if you have problems the morning of surgery:  [phone]   Remember: Do not eat after midnight. You may drink clear liquids until 4:30 AM. Clear liquids include water, non-citrus juices without pulp, carbonated beverages, clear tea, black coffee and gatorade.     Take these medicines the morning of surgery with A SIP OF WATER  carvedilol (COREG) rizatriptan (MAXALT) if needed  7 days prior to surgery STOP taking any Aspirin(unless otherwise instructed by your surgeon), Aleve, Naproxen, Ibuprofen, Motrin, Advil, Goody's, BC's, all herbal medications, fish oil, and all vitamins     Do not wear jewelry, make-up or nail polish.  Do not wear lotions, powders, or perfumes, or deodorant.  Do not shave 48 hours prior to surgery.   Do not bring valuables to the hospital.  [facility] is not responsible for any belongings or valuables.  Contacts, eyeglasses, hearing aids, dentures or bridgework may not be worn into surgery.  Leave your suitcase in the car.  After surgery it may be brought to your room.  For patients admitted to the hospital, discharge time will be determined by your treatment team.  Patients discharged the day of surgery will not be allowed to drive home.   [facility]- Preparing For Surgery  Before surgery, you can play an important role. Because skin is not sterile, your  skin needs to be as free of germs as possible. You can reduce the number of germs on your skin by washing with CHG (chlorahexidine gluconate) Soap before surgery.  CHG is an antiseptic cleaner which kills germs and bonds with the skin to continue killing germs even after washing.    Oral Hygiene is also important to reduce your risk of infection.  Remember - BRUSH YOUR TEETH THE MORNING OF SURGERY WITH YOUR REGULAR TOOTHPASTE  Please do not use if you have an allergy to CHG or antibacterial soaps. If your skin becomes reddened/irritated stop using the CHG.  Do not shave (including legs and underarms) for at least 48 hours prior to first CHG shower. It is OK to shave your face.  Please follow these instructions carefully.   1. Shower the NIGHT BEFORE SURGERY and the MORNING OF SURGERY with CHG.   2. If you chose to wash your hair, wash your hair first as usual with your normal shampoo.  3. After you shampoo, rinse your hair and body thoroughly to remove the shampoo.  4. Use CHG as you would any other liquid soap. You can apply CHG directly to the skin and wash gently with a scrungie or a clean washcloth.   5. Apply the CHG Soap to your body ONLY FROM THE NECK DOWN.  Do not use on open wounds or open sores. Avoid contact with your eyes, ears, mouth and genitals (private parts). Wash Face and genitals (private parts)  with your normal soap.  6. Wash thoroughly, paying special attention  to the area where your surgery will be performed.  7. Thoroughly rinse your body with warm water from the neck down.  8. DO NOT shower/wash with your normal soap after using and rinsing off the CHG Soap.  9. Pat yourself dry with a CLEAN TOWEL.  10. Wear CLEAN PAJAMAS to bed the night before surgery, wear comfortable clothes the morning of surgery  11. Place CLEAN SHEETS on your bed the night of your first shower and DO NOT SLEEP WITH PETS.    Day of Surgery: Shower as stated above. Do not apply any  deodorants/lotions.  Please wear clean clothes to the hospital/surgery center.   Remember to brush your teeth WITH YOUR REGULAR TOOTHPASTE.   Please read over the following fact sheets that you were given.

## 2018-10-09 ENCOUNTER — Encounter (HOSPITAL_COMMUNITY): Payer: Self-pay

## 2018-10-09 ENCOUNTER — Other Ambulatory Visit: Payer: Self-pay

## 2018-10-09 ENCOUNTER — Encounter (HOSPITAL_COMMUNITY)
Admission: RE | Admit: 2018-10-09 | Discharge: 2018-10-09 | Disposition: A | Discharge status: Home / Self Care | Payer: Medicare Other | Source: Ambulatory Visit | Attending: General Surgery | Admitting: General Surgery

## 2018-10-09 DIAGNOSIS — Z832 Family history of diseases of the blood and blood-forming organs and certain disorders involving the immune mechanism: Secondary | ICD-10-CM | POA: Diagnosis not present

## 2018-10-09 DIAGNOSIS — N186 End stage renal disease: Secondary | ICD-10-CM | POA: Diagnosis not present

## 2018-10-09 DIAGNOSIS — D649 Anemia, unspecified: Secondary | ICD-10-CM | POA: Diagnosis not present

## 2018-10-09 DIAGNOSIS — M549 Dorsalgia, unspecified: Secondary | ICD-10-CM | POA: Diagnosis not present

## 2018-10-09 DIAGNOSIS — Z01812 Encounter for preprocedural laboratory examination: Secondary | ICD-10-CM

## 2018-10-09 DIAGNOSIS — M1990 Unspecified osteoarthritis, unspecified site: Secondary | ICD-10-CM | POA: Diagnosis not present

## 2018-10-09 DIAGNOSIS — I252 Old myocardial infarction: Secondary | ICD-10-CM | POA: Diagnosis not present

## 2018-10-09 DIAGNOSIS — Z87442 Personal history of urinary calculi: Secondary | ICD-10-CM | POA: Diagnosis not present

## 2018-10-09 DIAGNOSIS — Z8042 Family history of malignant neoplasm of prostate: Secondary | ICD-10-CM | POA: Diagnosis not present

## 2018-10-09 DIAGNOSIS — Z171 Estrogen receptor negative status [ER-]: Secondary | ICD-10-CM | POA: Diagnosis not present

## 2018-10-09 DIAGNOSIS — C50511 Malignant neoplasm of lower-outer quadrant of right female breast: Secondary | ICD-10-CM | POA: Diagnosis not present

## 2018-10-09 DIAGNOSIS — G43909 Migraine, unspecified, not intractable, without status migrainosus: Secondary | ICD-10-CM | POA: Diagnosis not present

## 2018-10-09 DIAGNOSIS — I509 Heart failure, unspecified: Secondary | ICD-10-CM | POA: Diagnosis not present

## 2018-10-09 DIAGNOSIS — R011 Cardiac murmur, unspecified: Secondary | ICD-10-CM | POA: Diagnosis not present

## 2018-10-09 DIAGNOSIS — Z8249 Family history of ischemic heart disease and other diseases of the circulatory system: Secondary | ICD-10-CM | POA: Diagnosis not present

## 2018-10-09 DIAGNOSIS — E1122 Type 2 diabetes mellitus with diabetic chronic kidney disease: Secondary | ICD-10-CM | POA: Diagnosis not present

## 2018-10-09 DIAGNOSIS — I132 Hypertensive heart and chronic kidney disease with heart failure and with stage 5 chronic kidney disease, or end stage renal disease: Secondary | ICD-10-CM | POA: Diagnosis not present

## 2018-10-09 DIAGNOSIS — Z833 Family history of diabetes mellitus: Secondary | ICD-10-CM | POA: Diagnosis not present

## 2018-10-09 DIAGNOSIS — Z8261 Family history of arthritis: Secondary | ICD-10-CM | POA: Diagnosis not present

## 2018-10-09 HISTORY — DX: Personal history of urinary calculi: Z87.442

## 2018-10-09 LAB — BASIC METABOLIC PANEL
Anion gap: 12 (ref 5–15)
BUN: 22 mg/dL (ref 8–23)
CO2: 24 mmol/L (ref 22–32)
Calcium: 9.6 mg/dL (ref 8.9–10.3)
Chloride: 101 mmol/L (ref 98–111)
Creatinine, Ser: 1.6 mg/dL — ABNORMAL HIGH (ref 0.44–1.00)
GFR calc Af Amer: 36 mL/min — ABNORMAL LOW (ref >60)
GFR, EST NON AFRICAN AMERICAN: 31 mL/min — AB (ref >60)
Glucose, Bld: 101 mg/dL — ABNORMAL HIGH (ref 70–99)
Potassium: 4.4 mmol/L (ref 3.5–5.1)
Sodium: 137 mmol/L (ref 135–145)

## 2018-10-09 LAB — CBC
HCT: 36.8 % (ref 36.0–46.0)
Hemoglobin: 11.7 g/dL — ABNORMAL LOW (ref 12.0–15.0)
MCH: 25.9 pg — ABNORMAL LOW (ref 26.0–34.0)
MCHC: 31.8 g/dL (ref 30.0–36.0)
MCV: 81.6 fL (ref 80.0–100.0)
Platelets: 199 10*3/uL (ref 150–400)
RBC: 4.51 MIL/uL (ref 3.87–5.11)
RDW: 15.3 % (ref 11.5–15.5)
WBC: 7.1 10*3/uL (ref 4.0–10.5)
nRBC: 0 % (ref 0.0–0.2)

## 2018-10-09 NOTE — Progress Notes (Signed)
PCP - John  Cardiologist - [place]  Chest x-ray - 08/13/13(E)  EKG - 09/05/18  Stress Test - 10/27/16  ECHO -  09/14/18 Cardiac Cath - 2004  AICD-Medtronic. Email sent to [employer] PM- LOOP-  Sleep Study - Denies CPAP - Denies  LABS-CBC, BMP  ASA-Denies  Not a diabetic Fasting Blood Sugar -  Checks Blood Sugar _____ times a day  Anesthesia- Y. Cardiac history .Jeneen Rinks notified. )  Pt denies having chest pain, sob, or fever at this time. All instructions explained to the pt, with a verbal understanding of the material. Pt agrees to go over the instructions while at home for a better understanding. The opportunity to ask questions was provided.

## 2018-10-09 NOTE — Progress Notes (Signed)
Anesthesia Chart Review:  Case:  [id number] Date/Time:  10/11/18 0715   Procedures:      RIGHT BREAST LUMPECTOMY WITH RADIOACTIVE SEED AND SENTINEL LYMPH NODE BIOPSY (Right Breast)     INSERTION PORT-A-CATH WITH POSSIBLE ULTRASOUND (N/A )   Anesthesia type:  General   Pre-op diagnosis:  RIGHT BREAST CANCER   Location:  [facility] OR ROOM 01 / [facility]   Surgeon:  Stark Klein, MD    Radioactive seed implant scheduled for 10/10/18.   DISCUSSION: Patient is a 78 year old female scheduled for the above procedure.  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 BMI is consistent with obesity.   - Last seen by both Dr. Barry Dienes and EP cardiologist Dr. Caryl Comes on 09/05/18. Dr. Marlowe Aschoff note states, "I have contacted cardiology regarding risk assessment and defibrillator." Dr. Olin Pia note confirms knowledge for plans for lumpectomy, radiation, and chemotherapy for new diagnosis of breat cancer. ICD was interrogated that day and no changes were made in the programming. Electrolytes "okay." Echo was ordered which was felt to show an overall stable EF of 20-25%. One year EP cardiology follow-up with [facility] 10/31/18.  Preoperative labs appear stable. According to perioperative ICD Rx form, patient is not pacer dependent. Device will likely interfere with procedure, so Rep will need to disable tachy therapies for procedure.  Based on currently available information, I would anticipate that she can proceed as planned if no acute changes.     VS: BP (!) 123/58   Pulse 74   Temp (!) 36.4 C (Oral)   Resp 18   Ht 5\' 7"  (1.702 m)   Wt 96.2 kg   SpO2 100%   BMI 33.20 kg/m     PROVIDERS: Biagio Borg, MD is PCP. Last visit 07/26/18. [name], MD is EP cardiologist. Last visit 09/05/18. One year follow-up recommended. She had also seen cardiologist Kirk Ruths, MD in the past, but last visit 08/24/15. Nicholas Lose, MD is HEM-ONC - Eppie Gibson, MD is RAD-ONC   LABS: Labs reviewed: Acceptable for surgery. Cr 1.60, but results are consistent with previous results (Cr ~ 1.4-1.6 over the past year).  (all labs ordered are listed, but only abnormal results are displayed)  Labs Reviewed  BASIC METABOLIC PANEL - Abnormal; Notable for the following components:      Result Value   Glucose, Bld 101 (*)    Creatinine, Ser 1.60 (*)    GFR calc non Af Amer 31 (*)    GFR calc Af Amer 36 (*)    All other components within normal limits  CBC - Abnormal; Notable for the following components:   Hemoglobin 11.7 (*)    MCH 25.9 (*)    All other components within normal limits    EKG: 09/05/18 ([facility]): NSR, LVH with repolarization abnormality (negative inferolateral T waves).    CV: Echo 09/14/18: IMPRESSIONS  1. The left ventricle has severely reduced systolic function, with an ejection fraction of 20-25%. The cavity size was severely dilated. Left ventricular diastolic Doppler parameters are consistent with pseudonormalization.  2. The right ventricle has normal systolic function. The cavity was normal. There is no increase in right ventricular wall thickness.  3. Left atrial size was mildly dilated.  4. The mitral valve is normal in structure. Mitral valve regurgitation is mild to moderate by color flow Doppler.  5. The tricuspid valve is normal in structure.  6. The aortic valve is tricuspid Mild thickening of the aortic valve.  7. The pulmonic valve was grossly normal. Pulmonic valve regurgitation is mild by color flow Doppler.  8. The interatrial septum was not assessed. (Comparison LVEF: 25-30% 04/19/17, 15-20% 09/27/13, 25-30% 10/06/10)  Nuclear stress test  10/27/16:  The left ventricular ejection fraction is moderately decreased (30-44%).  Nuclear stress EF: 35%.  No change from baseline EKG showing NSR with LVH and repolarization abnormality.  The perfusion imaging is normal with no ischemia.  This is an intermediate risk study due to LV dysfunction.   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 Patient was referred to Dr. Lovena Le by Dr. Caryl Comes for consideration of ablation but Dr. Lovena Le felt medical therapy indicated."  Notes also indicate that she had normal coronaries with EF 28% by cardiac cath in 02/2003.   Past Medical History:  Diagnosis Date  . ALLERGIC RHINITIS 11/06/2007  . 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 ANEMIA-NOS 06/17/2008  . ANXIETY 02/22/2008  . CARDIOMYOPATHY, PRIMARY, DILATED 12/30/2008  . 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 GERD 05/07/2007  . History of kidney stones   . HYPERLIPIDEMIA 05/23/2007  . HYPERTENSION 05/07/2007  . 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 MENOPAUSAL DISORDER 05/06/2008  . 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 OVERACTIVE BLADDER 08/03/2010  . PYELONEPHRITIS, HX OF 05/07/2007  . SYSTOLIC HEART FAILURE, CHRONIC 12/28/2008  . VITAMIN D DEFICIENCY 06/17/2008    Past Surgical History:  Procedure Laterality Date  . ABDOMINAL HYSTERECTOMY    . ICD GENERATOR CHANGEOUT N/A 10/25/2017   Procedure: ICD GENERATOR CHANGEOUT;  Surgeon: Deboraha Sprang, MD;  Location: [facility];  Service: Cardiovascular;  Laterality: N/A;  . LAMINECTOMY      MEDICATIONS: . carvedilol (COREG) 25 MG tablet  . cholecalciferol (VITAMIN D3) 25 MCG (1000 UT) tablet  . ENTRESTO 24-26 MG  . furosemide (LASIX) 40 MG tablet  . Multiple Vitamin (MULTIVITAMIN WITH MINERALS) TABS tablet  . rizatriptan (MAXALT) 10 MG tablet  . spironolactone (ALDACTONE) 25 MG tablet   No current facility-administered medications for this encounter.      Myra Gianotti, PA-C Surgical Short Stay/Anesthesiology [facility] Phone [phone] [facility] Phone [phone] 10/09/2018 2:39 PM

## 2018-10-09 NOTE — Anesthesia Preprocedure Evaluation (Addendum)
Anesthesia Evaluation  Patient identified by MRN, date of birth, ID band Patient awake    Reviewed: Allergy & Precautions, NPO status , Patient's Chart, lab work & pertinent test results  Airway Mallampati: II  TM Distance: >3 FB Neck ROM: Full    Dental  (+) Teeth Intact, Dental Advisory Given   Pulmonary    breath sounds clear to auscultation       Cardiovascular hypertension,  Rhythm:Regular Rate:Normal     Neuro/Psych    GI/Hepatic   Endo/Other    Renal/GU      Musculoskeletal   Abdominal   Peds  Hematology   Anesthesia Other Findings   Reproductive/Obstetrics                            Anesthesia Physical Anesthesia Plan  ASA: III  Anesthesia Plan: General   Post-op Pain Management:  Regional for Post-op pain   Induction: Intravenous  PONV Risk Score and Plan: 1 and Ondansetron and Dexamethasone  Airway Management Planned: Oral ETT  Additional Equipment:   Intra-op Plan:   Post-operative Plan:   Informed Consent: I have reviewed the patients History and Physical, chart, labs and discussed the procedure including the risks, benefits and alternatives for the proposed anesthesia with the patient or authorized representative who has indicated his/her understanding and acceptance.     Dental advisory given  Plan Discussed with: CRNA and Anesthesiologist  Anesthesia Plan Comments: (PAT note written 10/09/2018 by Myra Gianotti, PA-C. History non-ischemic CM. Has Medtronic ICD.  )       Anesthesia Quick Evaluation

## 2018-10-09 NOTE — Progress Notes (Signed)
  Coronavirus Screening  Have you experienced the following symptoms:  Cough No Fever (>100.48F)  No Runny noseNo Sore throat No Difficulty breathing/shortness of breath  Have you or a family member traveled in the last 14 days and where? No   If the patient indicates "YES" to the above questions, their PAT will be rescheduled to limit the exposure to others and, the surgeon will be notified. THE PATIENT WILL NEED TO BE ASYMPTOMATIC FOR 14 DAYS.   If the patient is not experiencing any of these symptoms, the PAT nurse will instruct them to NOT bring anyone with them to their appointment since they may have these symptoms or traveled as well.   Please remind your patients and families that hospital visitation restrictions are in effect and the importance of the restrictions.

## 2018-10-10 NOTE — H&P (View-Only) (Signed)
[name] [name] Location: [facility] Patient #: [id number] DOB: 04-01-1941 Undefined / Language: Undefined / Race: White Female   History of Present Illness The patient is a 78 year old female who presents with breast cancer. Pt is 78 yo F who is referred for consultation by Dr. Luan Pulling regarding a new diagnosis of right breast cancer. She presented with a screening detected right breast mass. Diagnostic imaging was performed. This showed a 1.6 cm mass in the right breast at 8:30. Axilla was negative for concerning findings. Core needle biopsy was performed showing a grade 3 invasive ductal carcinoma, triple negative. Ki 67 was 80%. She has gotten regular mammograms.   She has no personal history of cancer, but has a relative with prostate cancer. Menarche was age 22, first child in late teens. G3P3. Menopause in late 2s. Some OCP use.   Of note, she has some heart failure and a defibrillator. It has recently been replaced due to battery issues. It also fired on Valentine's day.   Dx mammogram and [place] 08/23/2018 [facility] 1l1 cm round mass wtih lobulated margins adjacent to a 5 mm mass. They are adjacent for a total of 1.7-1.8 cm at 8:30, 8 cm from the nipple. There was also a spot of duct ectasia. negative axilla.    pathology right breast core needle bx 08/29/2018 Diagnosis Breast, right, needle core biopsy, 8:30 o'clock , 8 cm fn - INVASIVE DUCTAL CARCINOMA, GRADE III.  The tumor cells are NEGATIVE for Her2 (1+). Estrogen Receptor: 0%, NEGATIVE Progesterone Receptor: 0%, NEGATIVE Proliferation Marker Ki67: 80%  CBC, Slight anemia wtih Hgb 11.4.  CMET with Cr 1.44, o/w normal.     Past Surgical History Spinal Surgery - Lower Back   Diagnostic Studies History Colonoscopy  5-10 years ago Mammogram  within last year Pap Smear  1-5 years ago  Social History Caffeine use  Carbonated beverages. No alcohol use  Tobacco use  Never  smoker.  Family History Arthritis  Family Members In General. Bleeding disorder  Daughter, Family Members In General. Diabetes Mellitus  Family Members In General, Mother. Heart Disease  Family Members In General, Mother. Heart disease in female family member before age 82  Hypertension  Family Members In General, Father, Mother. Kidney Disease  Family Members In General, Father. Migraine Headache  Daughter, Son. Prostate Cancer  Family Members In General.  Pregnancy / Birth History  Age at menarche  11 years. Age of menopause  [age] Contraceptive History  Oral contraceptives. Gravida  3 Maternal age  67-20 Para  3  Other Problems Arthritis  Back Pain  Chest pain  Chronic Renal Failure Syndrome  Congestive Heart Failure  Heart murmur  High blood pressure  Kidney Stone  Migraine Headache  Myocardial infarction  Transfusion history     Review of Systems  General Present- Fatigue. Not Present- Appetite Loss, Chills, Fever, Night Sweats, Weight Gain and Weight Loss. Skin Not Present- Change in Wart/Mole, Dryness, Hives, Jaundice, New Lesions, Non-Healing Wounds, Rash and Ulcer. HEENT Present- Earache, Ringing in the Ears, Seasonal Allergies and Wears glasses/contact lenses. Not Present- Hearing Loss, Hoarseness, Nose Bleed, Oral Ulcers, Sinus Pain, Sore Throat, Visual Disturbances and Yellow Eyes. Respiratory Present- Snoring. Not Present- Bloody sputum, Chronic Cough, Difficulty Breathing and Wheezing. Cardiovascular Present- Difficulty Breathing Lying Down, Leg Cramps, Palpitations and Shortness of Breath. Not Present- Chest Pain, Rapid Heart Rate and Swelling of Extremities. Gastrointestinal Present- Difficulty Swallowing. Not Present- Abdominal Pain, Bloating, Bloody Stool, Change in Bowel Habits,  Chronic diarrhea, Constipation, Excessive gas, Gets full quickly at meals, Hemorrhoids, Indigestion, Nausea, Rectal Pain and Vomiting. Female  Genitourinary Present- Nocturia. Not Present- Frequency, Painful Urination, Pelvic Pain and Urgency. Musculoskeletal Present- Back Pain and Joint Pain. Not Present- Joint Stiffness, Muscle Pain, Muscle Weakness and Swelling of Extremities. Neurological Present- Headaches and Numbness. Not Present- Decreased Memory, Fainting, Seizures, Tingling, Tremor, Trouble walking and Weakness. Psychiatric Not Present- Anxiety, Bipolar, Change in Sleep Pattern, Depression, Fearful and Frequent crying. Endocrine Present- Cold Intolerance. Not Present- Excessive Hunger, Hair Changes, Heat Intolerance, Hot flashes and New Diabetes. Hematology Not Present- Blood Thinners, Easy Bruising, Excessive bleeding, Gland problems, HIV and Persistent Infections.  Vitals Weight: 213.5 lb Height: 67in Body Surface Area: 2.08 m Body Mass Index: 33.44 kg/m  Temp.: 16F  Pulse: 16 (Regular)  Resp.: 16 (Unlabored)  BP: 133/75 (Sitting, Left Arm, Standard)       Physical Exam  General Mental Status-Alert. General Appearance-Consistent with stated age. Hydration-Well hydrated. Voice-Normal.  Head and Neck Head-normocephalic, atraumatic with no lesions or palpable masses. Trachea-midline. Thyroid Gland Characteristics - normal size and consistency.  Eye Eyeball - Bilateral-Extraocular movements intact. Sclera/Conjunctiva - Bilateral-No scleral icterus.  Chest and Lung Exam Chest and lung exam reveals -quiet, even and easy respiratory effort with no use of accessory muscles and on auscultation, normal breath sounds, no adventitious sounds and normal vocal resonance. Inspection Chest Wall - Normal. Back - normal.  Breast Note: breasts are symmetric bilaterally. ptotic. No nipple retraction or discharge. No skin dimpling. ? palpable abnormality LOQ. 1-2 cm. a bit indistinct. no LAD.   Cardiovascular Cardiovascular examination reveals -normal heart sounds, regular rate and  rhythm with no murmurs and normal pedal pulses bilaterally. Note: +1-2 pitting edema.   Abdomen Inspection Inspection of the abdomen reveals - No Hernias. Palpation/Percussion Palpation and Percussion of the abdomen reveal - Soft, Non Tender, No Rebound tenderness, No Rigidity (guarding) and No hepatosplenomegaly. Auscultation Auscultation of the abdomen reveals - Bowel sounds normal.  Neurologic Neurologic evaluation reveals -alert and oriented x 3 with no impairment of recent or remote memory. Mental Status-Normal.  Musculoskeletal Global Assessment -Note: no gross deformities.  Normal Exam - Left-Upper Extremity Strength Normal and Lower Extremity Strength Normal. Normal Exam - Right-Upper Extremity Strength Normal and Lower Extremity Strength Normal.  Lymphatic Head & Neck  General Head & Neck Lymphatics: Bilateral - Description - Normal. Axillary  General Axillary Region: Bilateral - Description - Normal. Tenderness - Non Tender.    Assessment & Plan MALIGNANT NEOPLASM OF LOWER-OUTER QUADRANT OF RIGHT BREAST OF FEMALE, ESTROGEN RECEPTOR NEGATIVE (C50.511) Impression: Pt has a new diagnosis of a cT1cN0 right breast cancer. This is a triple negative lesion. We recommend lumpectomy, sentinel lymph node biopsy and port.  This would be followed by chemo and adjuvant XRT.  The surgical procedure was described to the patient. I discussed the incision type and location and that we would need radiology involved on with a wire or seed marker and/or sentinel node.  The risks and benefits of the procedure were described to the patient and she wishes to proceed.  We discussed the risks bleeding, infection, damage to other structures, need for further procedures/surgeries. We discussed the risk of seroma. The patient was advised if the area in the breast in cancer, we may need to go back to surgery for additional tissue to obtain negative margins or for a lymph node  biopsy. The patient was advised that these are the most common complications, but that others  can occur as well. They were advised against taking aspirin or other anti-inflammatory agents/blood thinners the week before surgery.  I also reviewed port placement and risks of port.  I have contacted cardiology regarding risk assessment and defibrillator. Current Plans You are being scheduled for surgery- Our schedulers will call you.  You should hear from our office's scheduling department within 5 working days about the location, date, and time of surgery. We try to make accommodations for patient's preferences in scheduling surgery, but sometimes the OR schedule or the surgeon's schedule prevents [place] from making those accommodations.  If you have not heard from our office [phone]) in 5 working days, call the office and ask for your surgeon's nurse.  If you have other questions about your diagnosis, plan, or surgery, call the office and ask for your surgeon's nurse.  Pt Education - [facility]b breast cancer surgery: discussed with patient and provided information. Pt Education - [facility] port insertion education

## 2018-10-10 NOTE — H&P (Signed)
[name] [name] Location: [facility] Patient #: [id number] DOB: Aug 24, 1940 Undefined / Language: Undefined / Race: White Female   History of Present Illness The patient is a 78 year old female who presents with breast cancer. Pt is 78 yo F who is referred for consultation by Dr. Luan Pulling regarding a new diagnosis of right breast cancer. She presented with a screening detected right breast mass. Diagnostic imaging was performed. This showed a 1.6 cm mass in the right breast at 8:30. Axilla was negative for concerning findings. Core needle biopsy was performed showing a grade 3 invasive ductal carcinoma, triple negative. Ki 67 was 80%. She has gotten regular mammograms.   She has no personal history of cancer, but has a relative with prostate cancer. Menarche was age 41, first child in late teens. G3P3. Menopause in late 69s. Some OCP use.   Of note, she has some heart failure and a defibrillator. It has recently been replaced due to battery issues. It also fired on Valentine's day.   Dx mammogram and [place] 08/23/2018 [facility] 1l1 cm round mass wtih lobulated margins adjacent to a 5 mm mass. They are adjacent for a total of 1.7-1.8 cm at 8:30, 8 cm from the nipple. There was also a spot of duct ectasia. negative axilla.    pathology right breast core needle bx 08/29/2018 Diagnosis Breast, right, needle core biopsy, 8:30 o'clock , 8 cm fn - INVASIVE DUCTAL CARCINOMA, GRADE III.  The tumor cells are NEGATIVE for Her2 (1+). Estrogen Receptor: 0%, NEGATIVE Progesterone Receptor: 0%, NEGATIVE Proliferation Marker Ki67: 80%  CBC, Slight anemia wtih Hgb 11.4.  CMET with Cr 1.44, o/w normal.     Past Surgical History Spinal Surgery - Lower Back   Diagnostic Studies History Colonoscopy  5-10 years ago Mammogram  within last year Pap Smear  1-5 years ago  Social History Caffeine use  Carbonated beverages. No alcohol use  Tobacco use  Never  smoker.  Family History Arthritis  Family Members In General. Bleeding disorder  Daughter, Family Members In General. Diabetes Mellitus  Family Members In General, Mother. Heart Disease  Family Members In General, Mother. Heart disease in female family member before age 6  Hypertension  Family Members In General, Father, Mother. Kidney Disease  Family Members In General, Father. Migraine Headache  Daughter, Son. Prostate Cancer  Family Members In General.  Pregnancy / Birth History  Age at menarche  58 years. Age of menopause  29-60 Contraceptive History  Oral contraceptives. Gravida  3 Maternal age  56-20 Para  3  Other Problems Arthritis  Back Pain  Chest pain  Chronic Renal Failure Syndrome  Congestive Heart Failure  Heart murmur  High blood pressure  Kidney Stone  Migraine Headache  Myocardial infarction  Transfusion history     Review of Systems  General Present- Fatigue. Not Present- Appetite Loss, Chills, Fever, Night Sweats, Weight Gain and Weight Loss. Skin Not Present- Change in Wart/Mole, Dryness, Hives, Jaundice, New Lesions, Non-Healing Wounds, Rash and Ulcer. HEENT Present- Earache, Ringing in the Ears, Seasonal Allergies and Wears glasses/contact lenses. Not Present- Hearing Loss, Hoarseness, Nose Bleed, Oral Ulcers, Sinus Pain, Sore Throat, Visual Disturbances and Yellow Eyes. Respiratory Present- Snoring. Not Present- Bloody sputum, Chronic Cough, Difficulty Breathing and Wheezing. Cardiovascular Present- Difficulty Breathing Lying Down, Leg Cramps, Palpitations and Shortness of Breath. Not Present- Chest Pain, Rapid Heart Rate and Swelling of Extremities. Gastrointestinal Present- Difficulty Swallowing. Not Present- Abdominal Pain, Bloating, Bloody Stool, Change in Bowel Habits,  Chronic diarrhea, Constipation, Excessive gas, Gets full quickly at meals, Hemorrhoids, Indigestion, Nausea, Rectal Pain and Vomiting. Female  Genitourinary Present- Nocturia. Not Present- Frequency, Painful Urination, Pelvic Pain and Urgency. Musculoskeletal Present- Back Pain and Joint Pain. Not Present- Joint Stiffness, Muscle Pain, Muscle Weakness and Swelling of Extremities. Neurological Present- Headaches and Numbness. Not Present- Decreased Memory, Fainting, Seizures, Tingling, Tremor, Trouble walking and Weakness. Psychiatric Not Present- Anxiety, Bipolar, Change in Sleep Pattern, Depression, Fearful and Frequent crying. Endocrine Present- Cold Intolerance. Not Present- Excessive Hunger, Hair Changes, Heat Intolerance, Hot flashes and New Diabetes. Hematology Not Present- Blood Thinners, Easy Bruising, Excessive bleeding, Gland problems, HIV and Persistent Infections.  Vitals Weight: 213.5 lb Height: 67in Body Surface Area: 2.08 m Body Mass Index: 33.44 kg/m  Temp.: 98F  Pulse: 16 (Regular)  Resp.: 16 (Unlabored)  BP: 133/75 (Sitting, Left Arm, Standard)       Physical Exam  General Mental Status-Alert. General Appearance-Consistent with stated age. Hydration-Well hydrated. Voice-Normal.  Head and Neck Head-normocephalic, atraumatic with no lesions or palpable masses. Trachea-midline. Thyroid Gland Characteristics - normal size and consistency.  Eye Eyeball - Bilateral-Extraocular movements intact. Sclera/Conjunctiva - Bilateral-No scleral icterus.  Chest and Lung Exam Chest and lung exam reveals -quiet, even and easy respiratory effort with no use of accessory muscles and on auscultation, normal breath sounds, no adventitious sounds and normal vocal resonance. Inspection Chest Wall - Normal. Back - normal.  Breast Note: breasts are symmetric bilaterally. ptotic. No nipple retraction or discharge. No skin dimpling. ? palpable abnormality LOQ. 1-2 cm. a bit indistinct. no LAD.   Cardiovascular Cardiovascular examination reveals -normal heart sounds, regular rate and  rhythm with no murmurs and normal pedal pulses bilaterally. Note: +1-2 pitting edema.   Abdomen Inspection Inspection of the abdomen reveals - No Hernias. Palpation/Percussion Palpation and Percussion of the abdomen reveal - Soft, Non Tender, No Rebound tenderness, No Rigidity (guarding) and No hepatosplenomegaly. Auscultation Auscultation of the abdomen reveals - Bowel sounds normal.  Neurologic Neurologic evaluation reveals -alert and oriented x 3 with no impairment of recent or remote memory. Mental Status-Normal.  Musculoskeletal Global Assessment -Note: no gross deformities.  Normal Exam - Left-Upper Extremity Strength Normal and Lower Extremity Strength Normal. Normal Exam - Right-Upper Extremity Strength Normal and Lower Extremity Strength Normal.  Lymphatic Head & Neck  General Head & Neck Lymphatics: Bilateral - Description - Normal. Axillary  General Axillary Region: Bilateral - Description - Normal. Tenderness - Non Tender.    Assessment & Plan MALIGNANT NEOPLASM OF LOWER-OUTER QUADRANT OF RIGHT BREAST OF FEMALE, ESTROGEN RECEPTOR NEGATIVE (C50.511) Impression: Pt has a new diagnosis of a cT1cN0 right breast cancer. This is a triple negative lesion. We recommend lumpectomy, sentinel lymph node biopsy and port.  This would be followed by chemo and adjuvant XRT.  The surgical procedure was described to the patient. I discussed the incision type and location and that we would need radiology involved on with a wire or seed marker and/or sentinel node.  The risks and benefits of the procedure were described to the patient and she wishes to proceed.  We discussed the risks bleeding, infection, damage to other structures, need for further procedures/surgeries. We discussed the risk of seroma. The patient was advised if the area in the breast in cancer, we may need to go back to surgery for additional tissue to obtain negative margins or for a lymph node  biopsy. The patient was advised that these are the most common complications, but that others   can occur as well. They were advised against taking aspirin or other anti-inflammatory agents/blood thinners the week before surgery.  I also reviewed port placement and risks of port.  I have contacted cardiology regarding risk assessment and defibrillator. Current Plans You are being scheduled for surgery- Our schedulers will call you.  You should hear from our office's scheduling department within 5 working days about the location, date, and time of surgery. We try to make accommodations for patient's preferences in scheduling surgery, but sometimes the OR schedule or the surgeon's schedule prevents [place] from making those accommodations.  If you have not heard from our office [phone]) in 5 working days, call the office and ask for your surgeon's nurse.  If you have other questions about your diagnosis, plan, or surgery, call the office and ask for your surgeon's nurse.  Pt Education - [facility] breast cancer surgery: discussed with patient and provided information. Pt Education - [facility] port insertion education

## 2018-10-11 ENCOUNTER — Encounter (HOSPITAL_COMMUNITY): Payer: Self-pay

## 2018-10-11 ENCOUNTER — Ambulatory Visit (HOSPITAL_COMMUNITY): Payer: Medicare Other

## 2018-10-11 ENCOUNTER — Encounter (HOSPITAL_COMMUNITY)
Admission: RE | Disposition: A | Discharge status: Home / Self Care | Payer: Self-pay | Source: Home / Self Care | Attending: General Surgery

## 2018-10-11 ENCOUNTER — Ambulatory Visit (HOSPITAL_COMMUNITY)
Admission: RE | Admit: 2018-10-11 | Discharge: 2018-10-11 | Disposition: A | Discharge status: Home / Self Care | Payer: Medicare Other | Source: Ambulatory Visit | Attending: General Surgery | Admitting: General Surgery

## 2018-10-11 ENCOUNTER — Ambulatory Visit (HOSPITAL_COMMUNITY): Payer: Medicare Other | Admitting: Anesthesiology

## 2018-10-11 ENCOUNTER — Ambulatory Visit (HOSPITAL_COMMUNITY)
Admission: RE | Admit: 2018-10-11 | Discharge: 2018-10-11 | Disposition: A | Discharge status: Home / Self Care | Payer: Medicare Other | Attending: General Surgery | Admitting: General Surgery

## 2018-10-11 ENCOUNTER — Other Ambulatory Visit: Payer: Self-pay

## 2018-10-11 ENCOUNTER — Ambulatory Visit (HOSPITAL_COMMUNITY): Payer: Medicare Other | Admitting: Physician Assistant

## 2018-10-11 DIAGNOSIS — Z171 Estrogen receptor negative status [ER-]: Principal | ICD-10-CM

## 2018-10-11 DIAGNOSIS — C50511 Malignant neoplasm of lower-outer quadrant of right female breast: Secondary | ICD-10-CM | POA: Insufficient documentation

## 2018-10-11 DIAGNOSIS — N186 End stage renal disease: Secondary | ICD-10-CM | POA: Diagnosis not present

## 2018-10-11 DIAGNOSIS — G8918 Other acute postprocedural pain: Secondary | ICD-10-CM | POA: Diagnosis not present

## 2018-10-11 DIAGNOSIS — Z8042 Family history of malignant neoplasm of prostate: Secondary | ICD-10-CM | POA: Insufficient documentation

## 2018-10-11 DIAGNOSIS — Z87442 Personal history of urinary calculi: Secondary | ICD-10-CM | POA: Diagnosis not present

## 2018-10-11 DIAGNOSIS — I132 Hypertensive heart and chronic kidney disease with heart failure and with stage 5 chronic kidney disease, or end stage renal disease: Secondary | ICD-10-CM | POA: Insufficient documentation

## 2018-10-11 DIAGNOSIS — I509 Heart failure, unspecified: Secondary | ICD-10-CM | POA: Insufficient documentation

## 2018-10-11 DIAGNOSIS — Z8261 Family history of arthritis: Secondary | ICD-10-CM | POA: Insufficient documentation

## 2018-10-11 DIAGNOSIS — Z8249 Family history of ischemic heart disease and other diseases of the circulatory system: Secondary | ICD-10-CM | POA: Insufficient documentation

## 2018-10-11 DIAGNOSIS — Z452 Encounter for adjustment and management of vascular access device: Secondary | ICD-10-CM | POA: Diagnosis not present

## 2018-10-11 DIAGNOSIS — R011 Cardiac murmur, unspecified: Secondary | ICD-10-CM | POA: Insufficient documentation

## 2018-10-11 DIAGNOSIS — Z832 Family history of diseases of the blood and blood-forming organs and certain disorders involving the immune mechanism: Secondary | ICD-10-CM | POA: Diagnosis not present

## 2018-10-11 DIAGNOSIS — C50911 Malignant neoplasm of unspecified site of right female breast: Secondary | ICD-10-CM | POA: Diagnosis not present

## 2018-10-11 DIAGNOSIS — Z833 Family history of diabetes mellitus: Secondary | ICD-10-CM | POA: Diagnosis not present

## 2018-10-11 DIAGNOSIS — M1990 Unspecified osteoarthritis, unspecified site: Secondary | ICD-10-CM | POA: Insufficient documentation

## 2018-10-11 DIAGNOSIS — G43909 Migraine, unspecified, not intractable, without status migrainosus: Secondary | ICD-10-CM | POA: Diagnosis not present

## 2018-10-11 DIAGNOSIS — E1122 Type 2 diabetes mellitus with diabetic chronic kidney disease: Secondary | ICD-10-CM | POA: Diagnosis not present

## 2018-10-11 DIAGNOSIS — Z419 Encounter for procedure for purposes other than remedying health state, unspecified: Secondary | ICD-10-CM

## 2018-10-11 DIAGNOSIS — D649 Anemia, unspecified: Secondary | ICD-10-CM | POA: Diagnosis not present

## 2018-10-11 DIAGNOSIS — I252 Old myocardial infarction: Secondary | ICD-10-CM | POA: Diagnosis not present

## 2018-10-11 DIAGNOSIS — M549 Dorsalgia, unspecified: Secondary | ICD-10-CM | POA: Diagnosis not present

## 2018-10-11 HISTORY — PX: BREAST LUMPECTOMY WITH RADIOACTIVE SEED AND SENTINEL LYMPH NODE BIOPSY: SHX6550

## 2018-10-11 HISTORY — PX: PORTACATH PLACEMENT: SHX2246

## 2018-10-11 SURGERY — BREAST LUMPECTOMY WITH RADIOACTIVE SEED AND SENTINEL LYMPH NODE BIOPSY
Anesthesia: General | Site: Chest | Laterality: Right

## 2018-10-11 MED ORDER — LACTATED RINGERS IV SOLN
INTRAVENOUS | Status: DC | PRN
  Administered 2018-10-11: 07:00:00 via INTRAVENOUS

## 2018-10-11 MED ORDER — LIDOCAINE 2% (20 MG/ML) 5 ML SYRINGE
INTRAMUSCULAR | Status: DC | PRN
  Administered 2018-10-11: 60 mg via INTRAVENOUS

## 2018-10-11 MED ORDER — LIDOCAINE HCL 1 % IJ SOLN
INTRAMUSCULAR | Status: DC | PRN
  Administered 2018-10-11: 10 mL

## 2018-10-11 MED ORDER — METHYLENE BLUE 0.5 % INJ SOLN
INTRAVENOUS | Status: AC
  Filled 2018-10-11: qty 10

## 2018-10-11 MED ORDER — ONDANSETRON HCL 4 MG/2ML IJ SOLN
INTRAMUSCULAR | Status: DC | PRN
  Administered 2018-10-11: 4 mg via INTRAVENOUS

## 2018-10-11 MED ORDER — MIDAZOLAM HCL 2 MG/2ML IJ SOLN
INTRAMUSCULAR | Status: AC
  Filled 2018-10-11: qty 2

## 2018-10-11 MED ORDER — SCOPOLAMINE 1 MG/3DAYS TD PT72
1.0000 | MEDICATED_PATCH | TRANSDERMAL | Status: DC
  Administered 2018-10-11: 1.5 mg via TRANSDERMAL
  Filled 2018-10-11: qty 1

## 2018-10-11 MED ORDER — ONDANSETRON HCL 4 MG/2ML IJ SOLN: 4.0000 mg | Freq: Once | INTRAMUSCULAR | Status: DC | PRN

## 2018-10-11 MED ORDER — OXYCODONE HCL 5 MG PO TABS
5.0000 mg | ORAL_TABLET | Freq: Once | ORAL | Status: AC | PRN
  Administered 2018-10-11: 5 mg via ORAL

## 2018-10-11 MED ORDER — FENTANYL CITRATE (PF) 250 MCG/5ML IJ SOLN
INTRAMUSCULAR | Status: DC | PRN
  Administered 2018-10-11 (×3): 50 ug via INTRAVENOUS

## 2018-10-11 MED ORDER — 0.9 % SODIUM CHLORIDE (POUR BTL) OPTIME
TOPICAL | Status: DC | PRN
  Administered 2018-10-11: 08:00:00 1000 mL

## 2018-10-11 MED ORDER — BUPIVACAINE-EPINEPHRINE (PF) 0.25% -1:200000 IJ SOLN
INTRAMUSCULAR | Status: DC | PRN
  Administered 2018-10-11: 30 mL via PERINEURAL

## 2018-10-11 MED ORDER — SODIUM CHLORIDE 0.9 % IV SOLN
INTRAVENOUS | Status: DC | PRN
  Administered 2018-10-11: 08:00:00 50 ug/min via INTRAVENOUS

## 2018-10-11 MED ORDER — BUPIVACAINE-EPINEPHRINE (PF) 0.25% -1:200000 IJ SOLN
INTRAMUSCULAR | Status: AC
  Filled 2018-10-11: qty 30

## 2018-10-11 MED ORDER — PROPOFOL 10 MG/ML IV BOLUS
INTRAVENOUS | Status: DC | PRN
  Administered 2018-10-11: 150 mg via INTRAVENOUS

## 2018-10-11 MED ORDER — TECHNETIUM TC 99M SULFUR COLLOID FILTERED
1.0000 | Freq: Once | INTRAVENOUS | Status: AC | PRN
  Administered 2018-10-11: 1 via INTRADERMAL

## 2018-10-11 MED ORDER — CHLORHEXIDINE GLUCONATE CLOTH 2 % EX PADS: 6.0000 | MEDICATED_PAD | Freq: Once | CUTANEOUS | Status: DC

## 2018-10-11 MED ORDER — MIDAZOLAM HCL 5 MG/5ML IJ SOLN
INTRAMUSCULAR | Status: DC | PRN
  Administered 2018-10-11: 1 mg via INTRAVENOUS

## 2018-10-11 MED ORDER — DEXAMETHASONE SODIUM PHOSPHATE 10 MG/ML IJ SOLN
INTRAMUSCULAR | Status: AC
  Filled 2018-10-11: qty 1

## 2018-10-11 MED ORDER — SODIUM CHLORIDE (PF) 0.9 % IJ SOLN
INTRAVENOUS | Status: DC | PRN
  Administered 2018-10-11: 08:00:00 5 mL

## 2018-10-11 MED ORDER — LIDOCAINE 2% (20 MG/ML) 5 ML SYRINGE
INTRAMUSCULAR | Status: AC
  Filled 2018-10-11: qty 5

## 2018-10-11 MED ORDER — HEPARIN SOD (PORK) LOCK FLUSH 100 UNIT/ML IV SOLN
INTRAVENOUS | Status: DC | PRN
  Administered 2018-10-11: 500 [IU] via INTRAVENOUS

## 2018-10-11 MED ORDER — FENTANYL CITRATE (PF) 250 MCG/5ML IJ SOLN
INTRAMUSCULAR | Status: AC
  Filled 2018-10-11: qty 5

## 2018-10-11 MED ORDER — ONDANSETRON HCL 4 MG/2ML IJ SOLN
INTRAMUSCULAR | Status: AC
  Filled 2018-10-11: qty 4

## 2018-10-11 MED ORDER — PROPOFOL 10 MG/ML IV BOLUS
INTRAVENOUS | Status: AC
  Filled 2018-10-11: qty 20

## 2018-10-11 MED ORDER — LIDOCAINE HCL (PF) 1 % IJ SOLN
INTRAMUSCULAR | Status: AC
  Filled 2018-10-11: qty 30

## 2018-10-11 MED ORDER — OXYCODONE HCL 5 MG/5ML PO SOLN: 5.0000 mg | Freq: Once | ORAL | Status: AC | PRN

## 2018-10-11 MED ORDER — PHENYLEPHRINE 40 MCG/ML (10ML) SYRINGE FOR IV PUSH (FOR BLOOD PRESSURE SUPPORT)
PREFILLED_SYRINGE | INTRAVENOUS | Status: AC
  Filled 2018-10-11: qty 10

## 2018-10-11 MED ORDER — OXYCODONE HCL 5 MG PO TABS
ORAL_TABLET | ORAL | Status: AC
  Filled 2018-10-11: qty 1

## 2018-10-11 MED ORDER — SODIUM CHLORIDE 0.9 % IV SOLN
INTRAVENOUS | Status: DC | PRN
  Administered 2018-10-11: 500 mL

## 2018-10-11 MED ORDER — ACETAMINOPHEN 500 MG PO TABS
1000.0000 mg | ORAL_TABLET | ORAL | Status: AC
  Administered 2018-10-11: 1000 mg via ORAL
  Filled 2018-10-11: qty 2

## 2018-10-11 MED ORDER — FENTANYL CITRATE (PF) 100 MCG/2ML IJ SOLN
INTRAMUSCULAR | Status: AC
  Filled 2018-10-11: qty 2

## 2018-10-11 MED ORDER — FENTANYL CITRATE (PF) 100 MCG/2ML IJ SOLN
25.0000 ug | INTRAMUSCULAR | Status: DC | PRN
  Administered 2018-10-11: 25 ug via INTRAVENOUS

## 2018-10-11 MED ORDER — SODIUM CHLORIDE (PF) 0.9 % IJ SOLN: INTRAVENOUS | Status: DC | PRN

## 2018-10-11 MED ORDER — CEFAZOLIN SODIUM-DEXTROSE 2-4 GM/100ML-% IV SOLN
2.0000 g | INTRAVENOUS | Status: AC
  Administered 2018-10-11: 08:00:00 2 g via INTRAVENOUS
  Filled 2018-10-11: qty 100

## 2018-10-11 MED ORDER — ROCURONIUM BROMIDE 50 MG/5ML IV SOSY
PREFILLED_SYRINGE | INTRAVENOUS | Status: AC
  Filled 2018-10-11: qty 15

## 2018-10-11 MED ORDER — HEPARIN SOD (PORK) LOCK FLUSH 100 UNIT/ML IV SOLN
INTRAVENOUS | Status: AC
  Filled 2018-10-11: qty 5

## 2018-10-11 MED ORDER — GABAPENTIN 100 MG PO CAPS
200.0000 mg | ORAL_CAPSULE | ORAL | Status: AC
  Administered 2018-10-11: 200 mg via ORAL
  Filled 2018-10-11: qty 2

## 2018-10-11 MED ORDER — ROCURONIUM BROMIDE 50 MG/5ML IV SOSY
PREFILLED_SYRINGE | INTRAVENOUS | Status: DC | PRN
  Administered 2018-10-11: 50 mg via INTRAVENOUS

## 2018-10-11 MED ORDER — SODIUM CHLORIDE (PF) 0.9 % IJ SOLN
INTRAMUSCULAR | Status: AC
  Filled 2018-10-11: qty 10

## 2018-10-11 MED ORDER — OXYCODONE HCL 5 MG PO TABS: 2.5000 mg | ORAL_TABLET | Freq: Four times a day (QID) | ORAL | 0 refills | Status: DC | PRN

## 2018-10-11 MED ORDER — SODIUM CHLORIDE 0.9 % IV SOLN
INTRAVENOUS | Status: AC
  Filled 2018-10-11: qty 1.2

## 2018-10-11 MED ORDER — SUGAMMADEX SODIUM 200 MG/2ML IV SOLN
INTRAVENOUS | Status: DC | PRN
  Administered 2018-10-11: 200 mg via INTRAVENOUS

## 2018-10-11 MED ORDER — DEXAMETHASONE SODIUM PHOSPHATE 10 MG/ML IJ SOLN
INTRAMUSCULAR | Status: DC | PRN
  Administered 2018-10-11: 5 mg via INTRAVENOUS

## 2018-10-11 SURGICAL SUPPLY — 71 items
ADH SKN CLS APL DERMABOND .7 (GAUZE/BANDAGES/DRESSINGS) ×2
BAG DECANTER FOR FLEXI CONT (MISCELLANEOUS) ×4 IMPLANT
BINDER BREAST XLRG (GAUZE/BANDAGES/DRESSINGS) ×2 IMPLANT
BINDER BREAST XXLRG (GAUZE/BANDAGES/DRESSINGS) ×2 IMPLANT
BLADE HEX COATED 2.75 (ELECTRODE) ×4 IMPLANT
BNDG COHESIVE 4X5 TAN STRL (GAUZE/BANDAGES/DRESSINGS) ×4 IMPLANT
CANISTER SUCT 3000ML PPV (MISCELLANEOUS) ×4 IMPLANT
CLIP VESOCCLUDE LG 6/CT (CLIP) ×4 IMPLANT
CLIP VESOCCLUDE MED 6/CT (CLIP) ×6 IMPLANT
CLIP VESOCCLUDE SM WIDE 6/CT (CLIP) ×4 IMPLANT
CLOSURE WOUND 1/2 X4 (GAUZE/BANDAGES/DRESSINGS) ×1
CONT SPEC 4OZ CLIKSEAL STRL BL (MISCELLANEOUS) ×8 IMPLANT
COVER PROBE W GEL 5X96 (DRAPES) ×4 IMPLANT
COVER SURGICAL LIGHT HANDLE (MISCELLANEOUS) ×4 IMPLANT
DECANTER SPIKE VIAL GLASS SM (MISCELLANEOUS) ×8 IMPLANT
DERMABOND ADVANCED (GAUZE/BANDAGES/DRESSINGS) ×2
DERMABOND ADVANCED .7 DNX12 (GAUZE/BANDAGES/DRESSINGS) ×2 IMPLANT
DEVICE DUBIN SPECIMEN MAMMOGRA (MISCELLANEOUS) ×2 IMPLANT
DRAPE C-ARM 42X72 X-RAY (DRAPES) ×3 IMPLANT
DRAPE CHEST BREAST 15X10 FENES (DRAPES) ×2 IMPLANT
DRAPE HALF SHEET 40X57 (DRAPES) ×1 IMPLANT
DRAPE UNIVERSAL PACK (DRAPES) ×4 IMPLANT
ELECT COATED BLADE 2.86 ST (ELECTRODE) ×4 IMPLANT
ELECT NDL BLADE 2-5/6 (NEEDLE) ×2 IMPLANT
ELECT NEEDLE BLADE 2-5/6 (NEEDLE) ×4 IMPLANT
ELECT REM PT RETURN 9FT ADLT (ELECTROSURGICAL) ×4
ELECTRODE REM PT RTRN 9FT ADLT (ELECTROSURGICAL) ×2 IMPLANT
GAUZE 4X4 16PLY RFD (DISPOSABLE) ×4 IMPLANT
GEL ULTRASOUND 20GR AQUASONIC (MISCELLANEOUS) ×2 IMPLANT
GLOVE BIO SURGEON STRL SZ 6 (GLOVE) ×6 IMPLANT
GLOVE BIOGEL PI IND STRL 6.5 (GLOVE) IMPLANT
GLOVE BIOGEL PI INDICATOR 6.5 (GLOVE) ×4
GLOVE INDICATOR 6.5 STRL GRN (GLOVE) ×6 IMPLANT
GLOVE SURG ORTHO 8.0 STRL STRW (GLOVE) ×2 IMPLANT
GLOVE SURG SS PI 6.0 STRL IVOR (GLOVE) ×4 IMPLANT
GOWN STRL REUS W/ TWL LRG LVL3 (GOWN DISPOSABLE) ×2 IMPLANT
GOWN STRL REUS W/ TWL XL LVL3 (GOWN DISPOSABLE) IMPLANT
GOWN STRL REUS W/TWL 2XL LVL3 (GOWN DISPOSABLE) ×6 IMPLANT
GOWN STRL REUS W/TWL LRG LVL3 (GOWN DISPOSABLE) ×8
GOWN STRL REUS W/TWL XL LVL3 (GOWN DISPOSABLE) ×4
KIT BASIN OR (CUSTOM PROCEDURE TRAY) ×4 IMPLANT
KIT MARKER MARGIN INK (KITS) ×4 IMPLANT
KIT PORT POWER 8FR ISP CVUE (Port) ×2 IMPLANT
LIGHT WAVEGUIDE WIDE FLAT (MISCELLANEOUS) ×2 IMPLANT
NDL FILTER BLUNT 18X1 1/2 (NEEDLE) IMPLANT
NDL HYPO 25GX1X1/2 BEV (NEEDLE) ×2 IMPLANT
NDL SAFETY ECLIPSE 18X1.5 (NEEDLE) IMPLANT
NEEDLE FILTER BLUNT 18X 1/2SAF (NEEDLE) ×2
NEEDLE FILTER BLUNT 18X1 1/2 (NEEDLE) ×2 IMPLANT
NEEDLE HYPO 18GX1.5 SHARP (NEEDLE) ×4
NEEDLE HYPO 25GX1X1/2 BEV (NEEDLE) ×8 IMPLANT
NS IRRIG 1000ML POUR BTL (IV SOLUTION) ×4 IMPLANT
PACK GENERAL/GYN (CUSTOM PROCEDURE TRAY) ×4 IMPLANT
PAD ABD 8X10 STRL (GAUZE/BANDAGES/DRESSINGS) ×2 IMPLANT
PAD ARMBOARD 7.5X6 YLW CONV (MISCELLANEOUS) ×4 IMPLANT
PENCIL BUTTON HOLSTER BLD 10FT (ELECTRODE) ×4 IMPLANT
STOCKINETTE IMPERVIOUS 9X36 MD (GAUZE/BANDAGES/DRESSINGS) ×4 IMPLANT
STRIP CLOSURE SKIN 1/2X4 (GAUZE/BANDAGES/DRESSINGS) ×1 IMPLANT
SUT MNCRL AB 4-0 PS2 18 (SUTURE) ×4 IMPLANT
SUT MON AB 4-0 PC3 18 (SUTURE) ×4 IMPLANT
SUT PROLENE 2 0 SH DA (SUTURE) ×8 IMPLANT
SUT VIC AB 3-0 SH 27 (SUTURE) ×4
SUT VIC AB 3-0 SH 27X BRD (SUTURE) ×2 IMPLANT
SUT VIC AB 3-0 SH 8-18 (SUTURE) ×4 IMPLANT
SYR 5ML LUER SLIP (SYRINGE) ×4 IMPLANT
SYR BULB 3OZ (MISCELLANEOUS) ×2 IMPLANT
SYR CONTROL 10ML LL (SYRINGE) ×4 IMPLANT
TOWEL GREEN STERILE (TOWEL DISPOSABLE) ×2 IMPLANT
TUBE CONNECTING 12'X1/4 (SUCTIONS) ×1
TUBE CONNECTING 12X1/4 (SUCTIONS) ×1 IMPLANT
YANKAUER SUCT BULB TIP NO VENT (SUCTIONS) ×2 IMPLANT

## 2018-10-11 NOTE — Anesthesia Procedure Notes (Signed)
Anesthesia Regional Block: Pectoralis block   Pre-Anesthetic Checklist: ,, timeout performed, Correct Patient, Correct Site, Correct Laterality, Correct Procedure, Correct Position, site marked, Risks and benefits discussed,  Surgical consent,  Pre-op evaluation,  At surgeon's request and post-op pain management  Laterality: Right  Prep: chloraprep       Needles:  Injection technique: Single-shot  Needle Type: Echogenic Needle     Needle Length: 9cm  Needle Gauge: 21     Additional Needles:   Procedures:,,,, ultrasound used (permanent image in chart),,,,  Narrative:  Start time: 10/11/2018 7:11 AM End time: 10/11/2018 7:16 AM Injection made incrementally with aspirations every 5 mL.  Performed by: Personally  Anesthesiologist: Catalina Gravel, MD  Additional Notes: No pain on injection. No increased resistance to injection. Injection made in 5cc increments.  Good needle visualization.  Patient tolerated procedure well.

## 2018-10-11 NOTE — Interval H&P Note (Signed)
History and Physical Interval Note:  10/11/2018 7:22 AM  [name] [name]  has presented today for surgery, with the diagnosis of RIGHT BREAST CANCER.  The various methods of treatment have been discussed with the patient and family. After consideration of risks, benefits and other options for treatment, the patient has consented to  Procedure(s): RIGHT BREAST LUMPECTOMY WITH RADIOACTIVE SEED AND SENTINEL LYMPH NODE BIOPSY (Right) INSERTION PORT-A-CATH WITH POSSIBLE ULTRASOUND (N/A) as a surgical intervention.  The patient's history has been reviewed, patient examined, no change in status, stable for surgery.  I have reviewed the patient's chart and labs.  Questions were answered to the patient's satisfaction.     Stark Klein

## 2018-10-11 NOTE — Discharge Instructions (Addendum)
[facility] Office Phone Number [phone]  BREAST BIOPSY/ PARTIAL MASTECTOMY: POST OP INSTRUCTIONS  Always review your discharge instruction sheet given to you by the facility where your surgery was performed.  IF YOU HAVE DISABILITY OR FAMILY LEAVE FORMS, YOU MUST BRING THEM TO THE OFFICE FOR PROCESSING.  DO NOT GIVE THEM TO YOUR DOCTOR.  1. A prescription for pain medication may be given to you upon discharge.  Take your pain medication as prescribed, if needed.  If narcotic pain medicine is not needed, then you may take acetaminophen (Tylenol) or ibuprofen (Advil) as needed. 2. Take your usually prescribed medications unless otherwise directed 3. If you need a refill on your pain medication, please contact your pharmacy.  They will contact our office to request authorization.  Prescriptions will not be filled after 5pm or on week-ends. 4. You should eat very light the first 24 hours after surgery, such as soup, crackers, pudding, etc.  Resume your normal diet the day after surgery. 5. Most patients will experience some swelling and bruising in the breast.  Ice packs and a good support bra will help.  Swelling and bruising can take several days to resolve.  6. It is common to experience some constipation if taking pain medication after surgery.  Increasing fluid intake and taking a stool softener will usually help or prevent this problem from occurring.  A mild laxative (Milk of Magnesia or Miralax) should be taken according to package directions if there are no bowel movements after 48 hours. 7. Unless discharge instructions indicate otherwise, you may remove your bandages 48 hours after surgery, and you may shower at that time.  You may have steri-strips (small skin tapes) in place directly over the incision.  These strips should be left on the skin for 7-10 days.   Any sutures or staples will be removed at the office during your follow-up visit. 8. ACTIVITIES:  You may resume  regular daily activities (gradually increasing) beginning the next day.  Wearing a good support bra or sports bra (or the breast binder) minimizes pain and swelling.  You may have sexual intercourse when it is comfortable. a. You may drive when you no longer are taking prescription pain medication, you can comfortably wear a seatbelt, and you can safely maneuver your car and apply brakes. b. RETURN TO WORK:  __________n/a______________ 9. You should see your doctor in the office for a follow-up appointment approximately two weeks after your surgery.  Your doctors nurse will typically make your follow-up appointment when she calls you with your pathology report.  Expect your pathology report 3-4 business days after your surgery.  You may call to check if you do not hear from [place] after three days.   WHEN TO CALL YOUR DOCTOR: 1. Fever over 101.0 2. Nausea and/or vomiting. 3. Extreme swelling or bruising. 4. Continued bleeding from incision. 5. Increased pain, redness, or drainage from the incision.  The clinic staff is available to answer your questions during regular business hours.  Please dont hesitate to call and ask to speak to one of the nurses for clinical concerns.  If you have a medical emergency, go to the nearest emergency room or call 911.  A surgeon from [facility] is always on call at the hospital.  For further questions, please visit [email or web address]

## 2018-10-11 NOTE — Op Note (Signed)
Right subclavian port placement (8 fr bard clearvue), right breast Radioactive seed localized lumpectomy and sentinel lymph node mapping and biopsy  Indications: This patient presents with history of right triple negative breast cancer, lower outer quadrant, cT1N0  Pre-operative Diagnosis: right breast cancer  Post-operative Diagnosis: Same  Surgeon: Stark Klein   Anesthesia: General endotracheal anesthesia  ASA Class: 3  Procedure Details  The patient was seen in the Holding Room. The risks, benefits, complications, treatment options, and expected outcomes were discussed with the patient. The possibilities of bleeding, infection, the need for additional procedures, failure to diagnose a condition, and creating a complication requiring transfusion or operation were discussed with the patient. The patient concurred with the proposed plan, giving informed consent.  The site of surgery properly noted/marked. The patient was taken to Operating Room # 1, identified, and the procedure verified as Right Breast seed localized Lumpectomy with sentinel lymph node biopsy, and port placement. The right arm, breast, and chest were prepped and draped in standard fashion. A Time Out was held and the above information confirmed.  Methylene blue was administered in the subareolar location.   Local anesthetic was administered under the angle of the right clavicle.  The vein was accessed with 1 pass(es) of the needle, but the wire would not thread.  A second single stick was used to access the right subclavian vein at a different angle and the wire did thread easily. There was good venous return and the wire passed easily with ectopy seen.  The wire was retracted until the ectopy resolved. Fluoroscopy was used to confirm that the wire was in the vena cava.      The patient was placed back level and the area for the pocket was anethetized   with local anesthetic.  A 3-cm transverse incision was made with a #15   blade.  Cautery was used to divide the subcutaneous tissues down to the   pectoralis muscle.  An [employer] retractor was used to elevate the skin   while a pocket was created on top of the pectoralis fascia.  The port   was placed into the pocket to confirm that it was of adequate size.  The   catheter was preattached to the port.  The port was then secured to the   pectoralis fascia with four 2-0 Prolene sutures.  These were clamped and   not tied down yet.    The catheter was tunneled through to the wire exit   site.  The catheter was placed along the wire to determine what length it should be to be in the SVC.  The catheter was cut at 16 cm.  The tunneler sheath and dilator were passed over the wire and the dilator and wire were removed.  The catheter was advanced through the tunneler sheath and the tunneler sheath was pulled away.  Care was taken to keep the catheter in the tunneler sheath as this occurred. This was advanced and the tunneler sheath was removed.  There was good venous   return and easy flush of the catheter.  The Prolene sutures were tied   down to the pectoral fascia.  The skin was reapproximated using 3-0   Vicryl interrupted deep dermal sutures.    Fluoroscopy was used to re-confirm good position of the catheter.  The skin   was then closed using 4-0 Monocryl in a subcuticular fashion.  The port was flushed with concentrated heparin flush as well.  The wounds were then cleaned, dried,  and dressed with Dermabond.    The lumpectomy was performed by creating a lateral inframammary incision near the previously placed radioactive seed.  Dissection was carried down to around the point of maximum signal intensity. The cautery was used to perform the dissection.  Hemostasis was achieved with cautery.   The specimen was inked with the margin marker paint kit.    Specimen radiography confirmed inclusion of the mammographic lesion, the clip, and the seed. The lesion appeared close to the  lateral and anterior margins, so additional tissue was taken at those two borders.  The edges of the cavity were marked with large clips, with one each medial, lateral, inferior and superior, and two clips posteriorly.  The background signal in the breast was zero.  The wound was irrigated and closed with 3-0 vicryl in layers and 4-0 monocryl subcuticular suture.    Using a hand-held gamma probe, right axillary sentinel nodes were identified transcutaneously.  An oblique incision was created below the axillary hairline.  Dissection was carried through the clavipectoral fascia.  Three deep level two axillary sentinel nodes were removed.  Counts per second were 10, 41, and 35.    The background count was 5 cps.  The wound was irrigated.  Hemostasis was achieved with cautery.  The axillary incision was closed with a 3-0 vicryl deep dermal interrupted sutures and a 4-0 monocryl subcuticular closure.    Sterile dressings were applied. At the end of the operation, all sponge, instrument, and needle counts were correct.  Findings: grossly clear surgical margins and no adenopathy, posterior margin is pectoralis  Estimated Blood Loss:  min         Specimens: right breast lumpectomy with seed and three right axillary sentinel lymph nodes.             Complications:  None; patient tolerated the procedure well.         Disposition: PACU - hemodynamically stable.         Condition: stable

## 2018-10-11 NOTE — Anesthesia Postprocedure Evaluation (Signed)
Anesthesia Post Note  Patient: [name] [name]  Procedure(s) Performed: RIGHT BREAST LUMPECTOMY WITH RADIOACTIVE SEED AND SENTINEL LYMPH NODE BIOPSY (Right Breast) INSERTION PORT-A-CATH (N/A Chest)     Patient location during evaluation: PACU Anesthesia Type: General Level of consciousness: awake and alert Pain management: pain level controlled Vital Signs Assessment: post-procedure vital signs reviewed and stable Respiratory status: spontaneous breathing, nonlabored ventilation, respiratory function stable and patient connected to nasal cannula oxygen Cardiovascular status: blood pressure returned to baseline and stable Postop Assessment: no apparent nausea or vomiting Anesthetic complications: no    Last Vitals:  Vitals:   10/11/18 1052 10/11/18 1110  BP: 128/67 117/72  Pulse: 60 69  Resp: 12 10  Temp: (!) 36.3 C (!) 36.3 C  SpO2: 100% 100%    Last Pain:  Vitals:   10/11/18 1052  TempSrc:   PainSc: 3                  Lakeem Rozo COKER

## 2018-10-11 NOTE — Progress Notes (Signed)
[employer] rep Tomi Bamberger) contacted for device assistance before surgery

## 2018-10-11 NOTE — Transfer of Care (Signed)
Immediate Anesthesia Transfer of Care Note  Patient: [name] [name]  Procedure(s) Performed: RIGHT BREAST LUMPECTOMY WITH RADIOACTIVE SEED AND SENTINEL LYMPH NODE BIOPSY (Right Breast) INSERTION PORT-A-CATH (N/A Chest)  Patient Location: PACU  Anesthesia Type:GA combined with regional for post-op pain  Level of Consciousness: drowsy  Airway & Oxygen Therapy: Patient Spontanous Breathing and Patient connected to face mask oxygen  Post-op Assessment: Report given to RN and Post -op Vital signs reviewed and stable  Post vital signs: Reviewed and stable  Last Vitals:  Vitals Value Taken Time  BP 129/63 10/11/2018 10:07 AM  Temp    Pulse 65 10/11/2018 10:07 AM  Resp 15 10/11/2018 10:07 AM  SpO2 100 % 10/11/2018 10:07 AM  Vitals shown include unvalidated device data.  Last Pain:  Vitals:   10/11/18 0605  TempSrc:   PainSc: 0-No pain         Complications: No apparent anesthesia complications

## 2018-10-11 NOTE — Anesthesia Procedure Notes (Signed)
Procedure Name: Intubation Date/Time: 10/11/2018 8:08 AM Performed by: Imagene Riches, CRNA Pre-anesthesia Checklist: Patient identified, Emergency Drugs available, Suction available and Patient being monitored Patient Re-evaluated:Patient Re-evaluated prior to induction Oxygen Delivery Method: Circle System Utilized Preoxygenation: Pre-oxygenation with 100% oxygen Induction Type: IV induction Ventilation: Mask ventilation without difficulty Laryngoscope Size: Miller and 1 Grade View: Grade I Tube type: Oral Tube size: 7.0 mm Number of attempts: 1 Airway Equipment and Method: Stylet and Oral airway Placement Confirmation: ETT inserted through vocal cords under direct vision,  positive ETCO2 and breath sounds checked- equal and bilateral Secured at: 21 cm Tube secured with: Tape Dental Injury: Teeth and Oropharynx as per pre-operative assessment

## 2018-10-12 ENCOUNTER — Encounter (HOSPITAL_COMMUNITY): Payer: Self-pay | Admitting: General Surgery

## 2018-10-16 NOTE — Assessment & Plan Note (Signed)
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%, T1CN0 stage Ib  Pathology counseling: I discussed the final pathology report of the patient provided  a copy of this report. I discussed the margins as well as lymph node surgeries. We also discussed the final staging along with previously performed ER/PR and HER-2/neu testing.  Review: Superior margin is broadly positive and will need to be cleared. Treatment plan: 1.  Adjuvant chemotherapy with CMF x6 cycles 2.  Followed by adjuvant radiation  We will plan adjuvant chemotherapy after resection of the margins. 

## 2018-10-17 ENCOUNTER — Telehealth: Payer: Self-pay | Admitting: General Surgery

## 2018-10-17 ENCOUNTER — Other Ambulatory Visit: Payer: Self-pay | Admitting: General Surgery

## 2018-10-17 NOTE — Telephone Encounter (Signed)
Discussed pathology with patient.  Have sent orders for reexcision.

## 2018-10-22 ENCOUNTER — Encounter (HOSPITAL_COMMUNITY): Payer: Self-pay | Admitting: *Deleted

## 2018-10-22 ENCOUNTER — Other Ambulatory Visit: Payer: Self-pay

## 2018-10-22 NOTE — Progress Notes (Signed)
Spoke with pt for pre-op call. Pt just had surgery here on 10/11/18. She states nothing has changed with her medical and surgical history. Pt denies any recent chest pain or sob. Pt has an ICD and she states it has not fired. Have faxed ICD orders to the [facility] and have emailed the reps Tomi Bamberger and Erlene Quan) and our Buyer, retail, Lindsi. When she was here on 10/11/18, the ICD required interrogation.    Coronavirus Screening  Have you experienced the following symptoms:  Cough no Fever (>100.38F)  no Runny nose no Sore throat no Difficulty breathing/shortness of breath  no  Have you or a family member traveled in the last 14 days and where? no

## 2018-10-23 ENCOUNTER — Ambulatory Visit: Payer: Medicare Other | Admitting: Hematology and Oncology

## 2018-10-23 ENCOUNTER — Other Ambulatory Visit: Payer: Medicare Other

## 2018-10-23 ENCOUNTER — Encounter (HOSPITAL_COMMUNITY)
Admission: RE | Disposition: A | Discharge status: Home / Self Care | Payer: Self-pay | Source: Home / Self Care | Attending: General Surgery

## 2018-10-23 ENCOUNTER — Ambulatory Visit (HOSPITAL_COMMUNITY)
Admission: RE | Admit: 2018-10-23 | Discharge: 2018-10-23 | Disposition: A | Discharge status: Home / Self Care | Payer: Medicare Other | Attending: General Surgery | Admitting: General Surgery

## 2018-10-23 ENCOUNTER — Ambulatory Visit (HOSPITAL_COMMUNITY): Payer: Medicare Other | Admitting: Certified Registered"

## 2018-10-23 ENCOUNTER — Encounter (HOSPITAL_COMMUNITY): Payer: Self-pay

## 2018-10-23 DIAGNOSIS — I132 Hypertensive heart and chronic kidney disease with heart failure and with stage 5 chronic kidney disease, or end stage renal disease: Secondary | ICD-10-CM | POA: Diagnosis not present

## 2018-10-23 DIAGNOSIS — I509 Heart failure, unspecified: Secondary | ICD-10-CM | POA: Insufficient documentation

## 2018-10-23 DIAGNOSIS — I219 Acute myocardial infarction, unspecified: Secondary | ICD-10-CM | POA: Insufficient documentation

## 2018-10-23 DIAGNOSIS — C50411 Malignant neoplasm of upper-outer quadrant of right female breast: Secondary | ICD-10-CM | POA: Diagnosis not present

## 2018-10-23 DIAGNOSIS — Z171 Estrogen receptor negative status [ER-]: Secondary | ICD-10-CM | POA: Insufficient documentation

## 2018-10-23 DIAGNOSIS — N186 End stage renal disease: Secondary | ICD-10-CM | POA: Insufficient documentation

## 2018-10-23 DIAGNOSIS — N6011 Diffuse cystic mastopathy of right breast: Secondary | ICD-10-CM | POA: Diagnosis not present

## 2018-10-23 DIAGNOSIS — C50511 Malignant neoplasm of lower-outer quadrant of right female breast: Secondary | ICD-10-CM | POA: Diagnosis not present

## 2018-10-23 DIAGNOSIS — Z95810 Presence of automatic (implantable) cardiac defibrillator: Secondary | ICD-10-CM | POA: Insufficient documentation

## 2018-10-23 HISTORY — DX: Presence of automatic (implantable) cardiac defibrillator: Z95.810

## 2018-10-23 HISTORY — PX: RE-EXCISION OF BREAST LUMPECTOMY: SHX6048

## 2018-10-23 SURGERY — EXCISION, LESION, BREAST
Anesthesia: General | Site: Breast | Laterality: Right

## 2018-10-23 MED ORDER — ONDANSETRON HCL 4 MG/2ML IJ SOLN
INTRAMUSCULAR | Status: DC | PRN
  Administered 2018-10-23: 4 mg via INTRAVENOUS

## 2018-10-23 MED ORDER — CEFAZOLIN SODIUM-DEXTROSE 2-4 GM/100ML-% IV SOLN
2.0000 g | INTRAVENOUS | Status: AC
  Administered 2018-10-23: 2 g via INTRAVENOUS

## 2018-10-23 MED ORDER — OXYCODONE HCL 5 MG PO TABS: 2.5000 mg | ORAL_TABLET | Freq: Four times a day (QID) | ORAL | 0 refills | Status: DC | PRN

## 2018-10-23 MED ORDER — FENTANYL CITRATE (PF) 100 MCG/2ML IJ SOLN
INTRAMUSCULAR | Status: AC
  Filled 2018-10-23: qty 2

## 2018-10-23 MED ORDER — KETOROLAC TROMETHAMINE 30 MG/ML IJ SOLN
INTRAMUSCULAR | Status: AC
  Filled 2018-10-23: qty 1

## 2018-10-23 MED ORDER — FENTANYL CITRATE (PF) 250 MCG/5ML IJ SOLN
INTRAMUSCULAR | Status: DC | PRN
  Administered 2018-10-23 (×2): 50 ug via INTRAVENOUS

## 2018-10-23 MED ORDER — DEXAMETHASONE SODIUM PHOSPHATE 10 MG/ML IJ SOLN
INTRAMUSCULAR | Status: AC
  Filled 2018-10-23: qty 1

## 2018-10-23 MED ORDER — SODIUM CHLORIDE 0.9 % IV SOLN
INTRAVENOUS | Status: DC | PRN
  Administered 2018-10-23: 45 ug/min via INTRAVENOUS

## 2018-10-23 MED ORDER — ROCURONIUM BROMIDE 10 MG/ML (PF) SYRINGE
PREFILLED_SYRINGE | INTRAVENOUS | Status: DC | PRN
  Administered 2018-10-23: 40 mg via INTRAVENOUS

## 2018-10-23 MED ORDER — DEXAMETHASONE SODIUM PHOSPHATE 10 MG/ML IJ SOLN
INTRAMUSCULAR | Status: DC | PRN
  Administered 2018-10-23: 5 mg via INTRAVENOUS

## 2018-10-23 MED ORDER — ROCURONIUM BROMIDE 50 MG/5ML IV SOSY
PREFILLED_SYRINGE | INTRAVENOUS | Status: AC
  Filled 2018-10-23: qty 5

## 2018-10-23 MED ORDER — CEFAZOLIN SODIUM-DEXTROSE 2-4 GM/100ML-% IV SOLN
INTRAVENOUS | Status: AC
  Filled 2018-10-23: qty 100

## 2018-10-23 MED ORDER — SUCCINYLCHOLINE CHLORIDE 200 MG/10ML IV SOSY
PREFILLED_SYRINGE | INTRAVENOUS | Status: AC
  Filled 2018-10-23: qty 10

## 2018-10-23 MED ORDER — ESMOLOL HCL 100 MG/10ML IV SOLN
INTRAVENOUS | Status: AC
  Filled 2018-10-23: qty 10

## 2018-10-23 MED ORDER — ACETAMINOPHEN 500 MG PO TABS
1000.0000 mg | ORAL_TABLET | ORAL | Status: AC
  Administered 2018-10-23: 11:00:00 1000 mg via ORAL

## 2018-10-23 MED ORDER — EPHEDRINE 5 MG/ML INJ
INTRAVENOUS | Status: AC
  Filled 2018-10-23: qty 10

## 2018-10-23 MED ORDER — ONDANSETRON HCL 4 MG/2ML IJ SOLN
INTRAMUSCULAR | Status: AC
  Filled 2018-10-23: qty 2

## 2018-10-23 MED ORDER — LIDOCAINE HCL 1 % IJ SOLN
INTRAMUSCULAR | Status: DC | PRN
  Administered 2018-10-23: 10 mL

## 2018-10-23 MED ORDER — PROPOFOL 10 MG/ML IV BOLUS
INTRAVENOUS | Status: DC | PRN
  Administered 2018-10-23: 100 mg via INTRAVENOUS

## 2018-10-23 MED ORDER — FENTANYL CITRATE (PF) 100 MCG/2ML IJ SOLN
25.0000 ug | INTRAMUSCULAR | Status: DC | PRN
  Administered 2018-10-23: 25 ug via INTRAVENOUS

## 2018-10-23 MED ORDER — LIDOCAINE 2% (20 MG/ML) 5 ML SYRINGE
INTRAMUSCULAR | Status: DC | PRN
  Administered 2018-10-23: 100 mg via INTRAVENOUS

## 2018-10-23 MED ORDER — LACTATED RINGERS IV SOLN
INTRAVENOUS | Status: DC
  Administered 2018-10-23: 11:00:00 via INTRAVENOUS

## 2018-10-23 MED ORDER — GABAPENTIN 100 MG PO CAPS
100.0000 mg | ORAL_CAPSULE | ORAL | Status: AC
  Administered 2018-10-23: 11:00:00 100 mg via ORAL

## 2018-10-23 MED ORDER — ACETAMINOPHEN 500 MG PO TABS
ORAL_TABLET | ORAL | Status: AC
  Administered 2018-10-23: 1000 mg via ORAL
  Filled 2018-10-23: qty 2

## 2018-10-23 MED ORDER — 0.9 % SODIUM CHLORIDE (POUR BTL) OPTIME
TOPICAL | Status: DC | PRN
  Administered 2018-10-23: 1000 mL

## 2018-10-23 MED ORDER — BUPIVACAINE-EPINEPHRINE (PF) 0.25% -1:200000 IJ SOLN
INTRAMUSCULAR | Status: AC
  Filled 2018-10-23: qty 30

## 2018-10-23 MED ORDER — CHLORHEXIDINE GLUCONATE CLOTH 2 % EX PADS: 6.0000 | MEDICATED_PAD | Freq: Once | CUTANEOUS | Status: DC

## 2018-10-23 MED ORDER — GABAPENTIN 100 MG PO CAPS
ORAL_CAPSULE | ORAL | Status: AC
  Administered 2018-10-23: 11:00:00 100 mg via ORAL
  Filled 2018-10-23: qty 1

## 2018-10-23 MED ORDER — FENTANYL CITRATE (PF) 250 MCG/5ML IJ SOLN
INTRAMUSCULAR | Status: AC
  Filled 2018-10-23: qty 5

## 2018-10-23 MED ORDER — PROMETHAZINE HCL 25 MG/ML IJ SOLN: 6.2500 mg | INTRAMUSCULAR | Status: DC | PRN

## 2018-10-23 MED ORDER — LIDOCAINE HCL (PF) 1 % IJ SOLN
INTRAMUSCULAR | Status: AC
  Filled 2018-10-23: qty 30

## 2018-10-23 MED ORDER — SUGAMMADEX SODIUM 200 MG/2ML IV SOLN
INTRAVENOUS | Status: DC | PRN
  Administered 2018-10-23: 180 mg via INTRAVENOUS

## 2018-10-23 MED ORDER — LIDOCAINE 2% (20 MG/ML) 5 ML SYRINGE
INTRAMUSCULAR | Status: AC
  Filled 2018-10-23: qty 5

## 2018-10-23 MED ORDER — KETOROLAC TROMETHAMINE 30 MG/ML IJ SOLN
30.0000 mg | Freq: Once | INTRAMUSCULAR | Status: AC | PRN
  Administered 2018-10-23: 30 mg via INTRAVENOUS

## 2018-10-23 MED ORDER — SUCCINYLCHOLINE CHLORIDE 20 MG/ML IJ SOLN
INTRAMUSCULAR | Status: DC | PRN
  Administered 2018-10-23: 100 mg via INTRAVENOUS

## 2018-10-23 SURGICAL SUPPLY — 42 items
ADH SKN CLS APL DERMABOND .7 (GAUZE/BANDAGES/DRESSINGS) ×1
APL PRP STRL LF DISP 70% ISPRP (MISCELLANEOUS) ×1
BINDER BREAST XXLRG (GAUZE/BANDAGES/DRESSINGS) ×1 IMPLANT
CANISTER SUCT 3000ML PPV (MISCELLANEOUS) ×2 IMPLANT
CHLORAPREP W/TINT 26 (MISCELLANEOUS) ×1 IMPLANT
CLIP VESOCCLUDE LG 6/CT (CLIP) ×1 IMPLANT
CONT SPEC 4OZ CLIKSEAL STRL BL (MISCELLANEOUS) ×2 IMPLANT
COVER SURGICAL LIGHT HANDLE (MISCELLANEOUS) ×2 IMPLANT
DERMABOND ADVANCED (GAUZE/BANDAGES/DRESSINGS) ×1
DERMABOND ADVANCED .7 DNX12 (GAUZE/BANDAGES/DRESSINGS) ×1 IMPLANT
DRAPE CHEST BREAST 15X10 FENES (DRAPES) ×2 IMPLANT
DRSG PAD ABDOMINAL 8X10 ST (GAUZE/BANDAGES/DRESSINGS) ×2 IMPLANT
ELECT BLADE 4.0 EZ CLEAN MEGAD (MISCELLANEOUS) ×2
ELECT COATED BLADE 2.86 ST (ELECTRODE) ×1 IMPLANT
ELECT REM PT RETURN 9FT ADLT (ELECTROSURGICAL) ×2
ELECTRODE BLDE 4.0 EZ CLN MEGD (MISCELLANEOUS) IMPLANT
ELECTRODE REM PT RTRN 9FT ADLT (ELECTROSURGICAL) ×1 IMPLANT
GAUZE SPONGE 4X4 12PLY STRL (GAUZE/BANDAGES/DRESSINGS) ×2 IMPLANT
GLOVE BIO SURGEON STRL SZ 6 (GLOVE) ×2 IMPLANT
GLOVE INDICATOR 6.5 STRL GRN (GLOVE) ×2 IMPLANT
GOWN STRL REUS W/ TWL LRG LVL3 (GOWN DISPOSABLE) ×1 IMPLANT
GOWN STRL REUS W/TWL 2XL LVL3 (GOWN DISPOSABLE) ×2 IMPLANT
GOWN STRL REUS W/TWL LRG LVL3 (GOWN DISPOSABLE) ×2
KIT BASIN OR (CUSTOM PROCEDURE TRAY) ×2 IMPLANT
KIT MARKER MARGIN INK (KITS) ×1 IMPLANT
KIT TURNOVER KIT B (KITS) ×2 IMPLANT
LIGHT WAVEGUIDE WIDE FLAT (MISCELLANEOUS) ×1 IMPLANT
NDL HYPO 25GX1X1/2 BEV (NEEDLE) ×1 IMPLANT
NEEDLE HYPO 25GX1X1/2 BEV (NEEDLE) ×2 IMPLANT
NS IRRIG 1000ML POUR BTL (IV SOLUTION) ×2 IMPLANT
PACK GENERAL/GYN (CUSTOM PROCEDURE TRAY) ×2 IMPLANT
PAD ARMBOARD 7.5X6 YLW CONV (MISCELLANEOUS) ×2 IMPLANT
PENCIL SMOKE EVACUATOR (MISCELLANEOUS) ×2 IMPLANT
SLEEVE SUCTION 125 (MISCELLANEOUS) ×1 IMPLANT
STRIP CLOSURE SKIN 1/2X4 (GAUZE/BANDAGES/DRESSINGS) ×2 IMPLANT
SUT MNCRL AB 4-0 PS2 18 (SUTURE) ×2 IMPLANT
SUT SILK 2 0 PERMA HAND 18 BK (SUTURE) ×1 IMPLANT
SUT VIC AB 3-0 SH 27 (SUTURE) ×2
SUT VIC AB 3-0 SH 27X BRD (SUTURE) ×1 IMPLANT
SYR CONTROL 10ML LL (SYRINGE) ×2 IMPLANT
TOWEL GREEN STERILE (TOWEL DISPOSABLE) ×1 IMPLANT
TOWEL GREEN STERILE FF (TOWEL DISPOSABLE) ×1 IMPLANT

## 2018-10-23 NOTE — Interval H&P Note (Signed)
History and Physical Interval Note:  10/23/2018 12:48 PM  [name] [name]  has presented today for surgery, with the diagnosis of right breast cancer.  The various methods of treatment have been discussed with the patient and family. After consideration of risks, benefits and other options for treatment, the patient has consented to  Procedure(s): RE-EXCISION OF RIGHT BREAST LUMPECTOMY (Right) as a surgical intervention.  The patient's history has been reviewed, patient examined, no change in status, stable for surgery.  I have reviewed the patient's chart and labs.  Questions were answered to the patient's satisfaction.     Stark Klein

## 2018-10-23 NOTE — Progress Notes (Signed)
Spoke with [employer] rep Tomi Bamberger regarding patients ICD. Tomi Bamberger will be present for assistance prior to surgery.   Jacqlyn Larsen, RN

## 2018-10-23 NOTE — Anesthesia Procedure Notes (Signed)
Procedure Name: Intubation Date/Time: 10/23/2018 1:10 PM Performed by: Julieta Bellini, CRNA Pre-anesthesia Checklist: Patient identified, Emergency Drugs available, Suction available and Patient being monitored Patient Re-evaluated:Patient Re-evaluated prior to induction Oxygen Delivery Method: Circle system utilized Preoxygenation: Pre-oxygenation with 100% oxygen Induction Type: IV induction and Rapid sequence Laryngoscope Size: Mac and 3 Grade View: Grade I Tube type: Oral Tube size: 7.0 mm Number of attempts: 1 Airway Equipment and Method: Stylet and Oral airway Placement Confirmation: ETT inserted through vocal cords under direct vision,  positive ETCO2 and breath sounds checked- equal and bilateral Secured at: 22 cm Tube secured with: Tape Dental Injury: Teeth and Oropharynx as per pre-operative assessment

## 2018-10-23 NOTE — Progress Notes (Signed)
HEMATOLOGY-ONCOLOGY [place] VISIT PROGRESS NOTE  I connected with [name] [name] on 10/24/2018 at  8:30 AM EDT by Webex video conference and verified that I am speaking with the correct person using two identifiers.  I discussed the limitations, risks, security and privacy concerns of performing an evaluation and management service by Webex and the availability of in person appointments.  I also discussed with the patient that there may be a patient responsible charge related to this service. The patient expressed understanding and agreed to proceed.  Patient's Location: Home Physician Location: Clinic  CHIEF COMPLIANT: Follow-up s/p re-excision of right lumpectomy  INTERVAL HISTORY: [name] [name] is a 78 y.o. female with above-mentioned history of right breast cancer who underwent a lumpectomy on 10/11/18 which showed involved margins and a re-excision on 10/23/18. She presents today over Webex to discuss the pathology report and further treatment with chemotherapy.     Malignant neoplasm of lower-outer quadrant of right breast of female, estrogen receptor negative ([place])   08/29/2018 Initial Diagnosis    Screening mammogram detected 2 adjacent masses right breast 8:30 position LOQ 8 cm from nipple 1.1 cm and 5 mm, combined mass 1.7 cm, axilla negative, biopsy: Grade 3 IDC triple negative, Ki-67 80%, T1CN0 stage 1B clinical stage    09/05/2018 Cancer Staging    Staging form: Breast, AJCC 8th Edition - Clinical stage from 09/05/2018: Stage IB (cT1c, cN0, cM0, G3, ER-, PR-, HER2-) - Signed by Nicholas Lose, MD on 09/05/2018    10/11/2018 Surgery    Right lumpectomy: Grade 3 IDC, 1.6 cm, 0/3 lymph nodes negative, superior margin broadly positive, triple negative, Ki-67 80%, T1CN0 stage Ib. Re-excision 10/23/18.    10/16/2018 Cancer Staging    Staging form: Breast, AJCC 8th Edition - Pathologic stage from 10/16/2018: Stage IB (pT1c, pN0, cM0, G3, ER-, PR-, HER2-) - Signed by Nicholas Lose, MD on 10/16/2018      REVIEW OF SYSTEMS:   Constitutional: Denies fevers, chills or abnormal weight loss Eyes: Denies blurriness of vision Ears, nose, mouth, throat, and face: Denies mucositis or sore throat Respiratory: Denies cough, dyspnea or wheezes Cardiovascular: Denies palpitation, chest discomfort Gastrointestinal:  Denies nausea, heartburn or change in bowel habits Skin: Denies abnormal skin rashes Lymphatics: Denies new lymphadenopathy or easy bruising Neurological:Denies numbness, tingling or new weaknesses Behavioral/Psych: Mood is stable, no new changes  Extremities: No lower extremity edema Breast: Recent Rt Lumpectomy All other systems were reviewed with the patient and are negative.  Observations/Objective:  There were no vitals filed for this visit. There is no height or weight on file to calculate BMI.  I have reviewed the data as listed CMP Latest Ref Rng & Units 10/09/2018 09/05/2018 08/24/2018  Glucose 70 - 99 mg/dL 101(H) 95 111(H)  BUN 8 - 23 mg/dL 22 16 17   Creatinine 0.44 - 1.00 mg/dL 1.60(H) 1.44(H) 1.47(H)  Sodium 135 - 145 mmol/L 137 140 140  Potassium 3.5 - 5.1 mmol/L 4.4 4.2 4.8  Chloride 98 - 111 mmol/L 101 104 99  CO2 22 - 32 mmol/L 24 27 25   Calcium 8.9 - 10.3 mg/dL 9.6 8.9 9.4  Total Protein 6.5 - 8.1 g/dL - 7.4 -  Total Bilirubin 0.3 - 1.2 mg/dL - 0.4 -  Alkaline Phos 38 - 126 U/L - 85 -  AST 15 - 41 U/L - 12(L) -  ALT 0 - 44 U/L - 7 -    Lab Results  Component Value Date   WBC 7.1 10/09/2018  HGB 11.7 (L) 10/09/2018   HCT 36.8 10/09/2018   MCV 81.6 10/09/2018   PLT 199 10/09/2018   NEUTROABS 2.9 09/05/2018      Assessment Plan:  Malignant neoplasm of lower-outer quadrant of right breast of female, estrogen receptor negative ([place]) 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%, T1CN0 stage Ib  Pathology counseling: I discussed the final pathology report of the  patient provided  a copy of this report. I discussed the margins as well as lymph node surgeries. We also discussed the final staging along with previously performed ER/PR and HER-2/neu testing.  Review: Superior margin was broadly positive: Patient had surgery 10/23/2018 awaiting the results  Treatment plan: 1.  Adjuvant chemotherapy with CMF x6 cycles tentative start date 11/15/2018 2.  Followed by adjuvant radiation  Chemo counseling: I once again reviewed chemotherapy plan with the patient. She has an appointment for chemo education coming up.  I discussed the assessment and treatment plan with the patient. The patient was provided an opportunity to ask questions and all were answered. The patient agreed with the plan and demonstrated an understanding of the instructions. The patient was advised to call back or seek an in-person evaluation if the symptoms worsen or if the condition fails to improve as anticipated.   I provided 20 minutes of face-to-face Web Ex time during this encounter.    Rulon Eisenmenger, MD 10/24/2018   I, Molly Dorshimer, am acting as scribe for Nicholas Lose, MD.  I have reviewed the above documentation for accuracy and completeness, and I agree with the above.

## 2018-10-23 NOTE — Progress Notes (Signed)
Wasted 75 mcg of fentanyl witnessed by Dorise Hiss, RN.

## 2018-10-23 NOTE — Op Note (Signed)
Re-excisional Right Breast Lumpectomy   Indications: This patient presents with history of positive margins after partial mastectomy for right breast cancer   Pre-operative Diagnosis: right breast cancer   Post-operative Diagnosis: right breast cancer   Surgeon: ,   Assistants: n/a   Anesthesia: General anesthesia and Local anesthesia  ASA Class: 3   Procedure Details  The patient was seen in the Holding Room. The risks, benefits, complications, treatment options, and expected outcomes were discussed with the patient. The possibilities of reaction to medication, pulmonary aspiration, bleeding, infection, the need for additional procedures, failure to diagnose a condition, and creating a complication requiring transfusion or operation were discussed with the patient. The patient concurred with the proposed plan, giving informed consent. The site of surgery properly noted/marked. The patient was taken to Operating Room # 1, identified, and the procedure verified as re-excision of right breast cancer.  After induction of anesthesia, the right breast and chest were prepped and draped in standard fashion. The lumpectomy was performed by reopening the prior incision. Seroma was aspirated. The mastopexy sutures were removed. Additional margin was taken at superior borders of the partial mastectomy cavity. The specimen was marked appropriately with paint kit. Hemostasis was achieved with cautery. The wound was irrigated and closed with a 3-0 Vicryl deep dermal interrupted and a 4-0 Monocryl subcuticular closure in layers.   Sterile dressings were applied. At the end of the operation, all sponge, instrument, and needle counts were correct.   Findings:  grossly clear surgical margins, posterior margin is pectoralis.  Estimated Blood Loss: Minimal   Drains: none   Specimens: additional superior margins.   Complications: None; patient tolerated the procedure well.   Disposition: PACU  - hemodynamically stable.   Condition: stable   

## 2018-10-23 NOTE — Anesthesia Preprocedure Evaluation (Signed)
Anesthesia Evaluation  Patient identified by MRN, date of birth, ID band Patient awake    Reviewed: Allergy & Precautions, NPO status , Patient's Chart, lab work & pertinent test results  Airway Mallampati: II  TM Distance: >3 FB Neck ROM: Full    Dental no notable dental hx.    Pulmonary neg pulmonary ROS,    Pulmonary exam normal breath sounds clear to auscultation       Cardiovascular hypertension, +CHF  Normal cardiovascular exam+ Cardiac Defibrillator  Rhythm:Regular Rate:Normal     Neuro/Psych negative neurological ROS  negative psych ROS   GI/Hepatic negative GI ROS, Neg liver ROS,   Endo/Other  negative endocrine ROS  Renal/GU negative Renal ROS  negative genitourinary   Musculoskeletal negative musculoskeletal ROS (+)   Abdominal   Peds negative pediatric ROS (+)  Hematology negative hematology ROS (+)   Anesthesia Other Findings   Reproductive/Obstetrics negative OB ROS                             Anesthesia Physical Anesthesia Plan  ASA: III  Anesthesia Plan: General   Post-op Pain Management:    Induction: Intravenous and Rapid sequence  PONV Risk Score and Plan: 3 and Ondansetron, Dexamethasone and Treatment may vary due to age or medical condition  Airway Management Planned: Oral ETT  Additional Equipment:   Intra-op Plan:   Post-operative Plan: Extubation in OR  Informed Consent: I have reviewed the patients History and Physical, chart, labs and discussed the procedure including the risks, benefits and alternatives for the proposed anesthesia with the patient or authorized representative who has indicated his/her understanding and acceptance.     Dental advisory given  Plan Discussed with: CRNA and Surgeon  Anesthesia Plan Comments:         Anesthesia Quick Evaluation

## 2018-10-23 NOTE — Transfer of Care (Signed)
Immediate Anesthesia Transfer of Care Note  Patient: [name] [name]  Procedure(s) Performed: RE-EXCISION OF RIGHT BREAST LUMPECTOMY (Right Breast)  Patient Location: PACU  Anesthesia Type:General  Level of Consciousness: drowsy and patient cooperative  Airway & Oxygen Therapy: Patient Spontanous Breathing  Post-op Assessment: Report given to RN, Post -op Vital signs reviewed and stable and Patient moving all extremities X 4  Post vital signs: Reviewed and stable  Last Vitals:  Vitals Value Taken Time  BP 134/62 10/23/2018  2:29 PM  Temp    Pulse 73 10/23/2018  2:30 PM  Resp 17 10/23/2018  2:30 PM  SpO2 95 % 10/23/2018  2:30 PM  Vitals shown include unvalidated device data.  Last Pain:  Vitals:   10/23/18 1108  TempSrc:   PainSc: 0-No pain         Complications: No apparent anesthesia complications

## 2018-10-23 NOTE — Discharge Instructions (Addendum)
[facility] °Office Phone Number [phone] ° °BREAST BIOPSY/ PARTIAL MASTECTOMY: POST OP INSTRUCTIONS ° °Always review your discharge instruction sheet given to you by the facility where your surgery was performed. ° °IF YOU HAVE DISABILITY OR FAMILY LEAVE FORMS, YOU MUST BRING THEM TO THE OFFICE FOR PROCESSING.  DO NOT GIVE THEM TO YOUR DOCTOR. ° °1. A prescription for pain medication may be given to you upon discharge.  Take your pain medication as prescribed, if needed.  If narcotic pain medicine is not needed, then you may take acetaminophen (Tylenol) or ibuprofen (Advil) as needed. °2. Take your usually prescribed medications unless otherwise directed °3. If you need a refill on your pain medication, please contact your pharmacy.  They will contact our office to request authorization.  Prescriptions will not be filled after 5pm or on week-ends. °4. You should eat very light the first 24 hours after surgery, such as soup, crackers, pudding, etc.  Resume your normal diet the day after surgery. °5. Most patients will experience some swelling and bruising in the breast.  Ice packs and a good support bra will help.  Swelling and bruising can take several days to resolve.  °6. It is common to experience some constipation if taking pain medication after surgery.  Increasing fluid intake and taking a stool softener will usually help or prevent this problem from occurring.  A mild laxative (Milk of Magnesia or Miralax) should be taken according to package directions if there are no bowel movements after 48 hours. °7. Unless discharge instructions indicate otherwise, you may remove your bandages 48 hours after surgery, and you may shower at that time.  You may have steri-strips (small skin tapes) in place directly over the incision.  These strips should be left on the skin for 7-10 days.   Any sutures or staples will be removed at the office during your follow-up visit. °8. ACTIVITIES:  You may resume  regular daily activities (gradually increasing) beginning the next day.  Wearing a good support bra or sports bra (or the breast binder) minimizes pain and swelling.  You may have sexual intercourse when it is comfortable. °a. You may drive when you no longer are taking prescription pain medication, you can comfortably wear a seatbelt, and you can safely maneuver your car and apply brakes. °b. RETURN TO WORK:  __________1 week_______________ °9. You should see your doctor in the office for a follow-up appointment approximately two weeks after your surgery.  Your doctor’s nurse will typically make your follow-up appointment when she calls you with your pathology report.  Expect your pathology report 2-3 business days after your surgery.  You may call to check if you do not hear from us after three days. ° ° °WHEN TO CALL YOUR DOCTOR: °1. Fever over 101.0 °2. Nausea and/or vomiting. °3. Extreme swelling or bruising. °4. Continued bleeding from incision. °5. Increased pain, redness, or drainage from the incision. ° °The clinic staff is available to answer your questions during regular business hours.  Please don’t hesitate to call and ask to speak to one of the nurses for clinical concerns.  If you have a medical emergency, go to the nearest emergency room or call 911.  A surgeon from [facility] is always on call at the hospital. ° °For further questions, please visit [email or web address]  ° °

## 2018-10-23 NOTE — Progress Notes (Signed)
Witnessed Chief Strategy Officer 75 mcg of fentanyl in stericycle.

## 2018-10-24 ENCOUNTER — Inpatient Hospital Stay: Payer: Medicare Other | Attending: Hematology and Oncology | Admitting: Hematology and Oncology

## 2018-10-24 ENCOUNTER — Encounter (HOSPITAL_COMMUNITY): Payer: Self-pay | Admitting: General Surgery

## 2018-10-24 DIAGNOSIS — C50511 Malignant neoplasm of lower-outer quadrant of right female breast: Secondary | ICD-10-CM

## 2018-10-24 DIAGNOSIS — Z171 Estrogen receptor negative status [ER-]: Secondary | ICD-10-CM

## 2018-10-24 NOTE — Progress Notes (Signed)
Please let patient know final margin is negative.

## 2018-10-24 NOTE — Anesthesia Postprocedure Evaluation (Signed)
Anesthesia Post Note  Patient: [name] [name]  Procedure(s) Performed: RE-EXCISION OF RIGHT BREAST LUMPECTOMY (Right Breast)     Patient location during evaluation: PACU Anesthesia Type: General Level of consciousness: awake and alert Pain management: pain level controlled Vital Signs Assessment: post-procedure vital signs reviewed and stable Respiratory status: spontaneous breathing, nonlabored ventilation, respiratory function stable and patient connected to nasal cannula oxygen Cardiovascular status: blood pressure returned to baseline and stable Postop Assessment: no apparent nausea or vomiting Anesthetic complications: no    Last Vitals:  Vitals:   10/23/18 1500 10/23/18 1515  BP: (!) 141/69 135/67  Pulse: 65 68  Resp: 12 13  Temp:  (!) 36.1 C  SpO2: 96% 99%    Last Pain:  Vitals:   10/23/18 1515  TempSrc:   PainSc: 2                  Bethzaida Boord S

## 2018-10-25 ENCOUNTER — Encounter: Payer: Self-pay | Admitting: *Deleted

## 2018-10-31 ENCOUNTER — Inpatient Hospital Stay: Payer: Medicare Other

## 2018-11-01 ENCOUNTER — Encounter: Payer: Medicare Other | Admitting: Physical Therapy

## 2018-11-01 ENCOUNTER — Telehealth: Payer: Self-pay | Admitting: Physical Therapy

## 2018-11-01 ENCOUNTER — Other Ambulatory Visit: Payer: Self-pay | Admitting: Adult Health

## 2018-11-01 NOTE — Telephone Encounter (Signed)
Spoke with pt to discuss whether she needs to come in to the PT clinic urgently or if we could discuss her progress and advise her over the phone. She declined a telehealth visit at this time. Her main concern is edema under her arm and numbness she feels in her right axilla and medial upper arm. We discussed her shoulder ROM and advised her on exercises. It sounds like she has regained full range of motion and is currently walking regularly for exercise.  She requested I contact her surgeon to let her know about concerns of her edema and to let her know she has some questions since she hasn't been seen post op. Message will be sent to Dr. Barry Dienes. Patient knows to contact us if she has questions or concerns. Annia Friendly, [place] 11/01/18 11:34 AM

## 2018-11-05 ENCOUNTER — Other Ambulatory Visit: Payer: Self-pay

## 2018-11-05 ENCOUNTER — Ambulatory Visit (INDEPENDENT_AMBULATORY_CARE_PROVIDER_SITE_OTHER): Payer: Medicare Other | Admitting: *Deleted

## 2018-11-05 ENCOUNTER — Other Ambulatory Visit: Payer: Self-pay | Admitting: Hematology and Oncology

## 2018-11-05 DIAGNOSIS — I5022 Chronic systolic (congestive) heart failure: Secondary | ICD-10-CM

## 2018-11-05 DIAGNOSIS — I428 Other cardiomyopathies: Secondary | ICD-10-CM

## 2018-11-05 LAB — CUP PACEART REMOTE DEVICE CHECK
Battery Remaining Longevity: 126 mo
Battery Voltage: 3.01 V
Brady Statistic RV Percent Paced: 0.01 %
Date Time Interrogation Session: 20200427185137
HighPow Impedance: 39 Ohm
HighPow Impedance: 49 Ohm
Implantable Lead Implant Date: 20081215
Implantable Lead Location: 753860
Implantable Lead Model: 6947
Implantable Pulse Generator Implant Date: 20190418
Lead Channel Impedance Value: 342 Ohm
Lead Channel Impedance Value: 494 Ohm
Lead Channel Pacing Threshold Amplitude: 0.625 V
Lead Channel Pacing Threshold Pulse Width: 0.4 ms
Lead Channel Sensing Intrinsic Amplitude: 13 mV
Lead Channel Sensing Intrinsic Amplitude: 13 mV
Lead Channel Setting Pacing Amplitude: 2.5 V
Lead Channel Setting Pacing Pulse Width: 0.4 ms
Lead Channel Setting Sensing Sensitivity: 0.3 mV

## 2018-11-05 MED ORDER — PROCHLORPERAZINE MALEATE 10 MG PO TABS: 10.0000 mg | ORAL_TABLET | Freq: Four times a day (QID) | ORAL | 1 refills | Status: DC | PRN

## 2018-11-05 MED ORDER — ONDANSETRON HCL 8 MG PO TABS: 8.0000 mg | ORAL_TABLET | Freq: Two times a day (BID) | ORAL | 1 refills | Status: DC | PRN

## 2018-11-05 MED ORDER — LORAZEPAM 0.5 MG PO TABS: 0.5000 mg | ORAL_TABLET | Freq: Every evening | ORAL | 0 refills | Status: DC | PRN

## 2018-11-05 MED ORDER — LIDOCAINE-PRILOCAINE 2.5-2.5 % EX CREA: TOPICAL_CREAM | CUTANEOUS | 3 refills | Status: DC

## 2018-11-05 NOTE — Progress Notes (Signed)
START OFF PATHWAY REGIMEN - Breast   [id number]:CMF (IV cyclophosphamide) q21 days:   A cycle is every 21 days:     Cyclophosphamide      Methotrexate      5-Fluorouracil   **Always confirm dose/schedule in your pharmacy ordering system**  Patient Characteristics: Postoperative without Neoadjuvant Therapy (Pathologic Staging), Invasive Disease, Adjuvant Therapy, HER2 Negative/Unknown/Equivocal, ER Negative/Unknown, Node Negative, pT1a-c, N57m or pT1c or Higher, pN0 Therapeutic Status: Postoperative without Neoadjuvant Therapy (Pathologic Staging) AJCC Grade: G3 AJCC N Category: pN0 AJCC M Category: cM0 ER Status: Negative (-) AJCC 8 Stage Grouping: IB HER2 Status: Negative (-) Oncotype Dx Recurrence Score: Not Appropriate AJCC T Category: pT1c PR Status: Negative (-) Intent of Therapy: Curative Intent, Discussed with Patient

## 2018-11-05 NOTE — Addendum Note (Signed)
Addended by: Nicholas Lose on: 11/05/2018 08:18 AM   Modules accepted: Orders

## 2018-11-06 ENCOUNTER — Encounter: Payer: Self-pay | Admitting: Hematology and Oncology

## 2018-11-06 NOTE — Progress Notes (Signed)
Pt called me back and would like to apply for copay assistance.  She gave me consent to apply in her behalf so I applied to the[employer] Foundationand she was approvedfor $16,000. The standard award is $2,500 and the additional $13,500 is accessible on a first-come, first- served basis as long as funding remains available in the [place] with a 6 month look back periodfrom 11/06/18.  Pt isabove the income requirementfor the [employer] she doesn't qualify at this time. Iwillgiveher my cardat her next visit for anyquestions or concernsshe may havein the future.

## 2018-11-06 NOTE — Progress Notes (Signed)
Called pt to introduce myself as her Arboriculturist and to discuss copay assistance.  Pt thinks her ins should pay for her treatment at 100% but she will call them to be sure and call me back with the outcome.

## 2018-11-08 ENCOUNTER — Encounter: Payer: Medicare Other | Admitting: Physical Therapy

## 2018-11-12 NOTE — Assessment & Plan Note (Signed)
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%, T1CN0 stage Ib  Review: Superior margin was broadly positive: Patient had surgery 10/23/2018 awaiting the results  Treatment plan: 1.  Adjuvant chemotherapy with CMF x6 cycles tentative start date 11/15/2018 2.  Followed by adjuvant radiation ----------------------------------------------------------------------------------------------------------------------------------------------------- Current treatment: Cycle 1 CMF Labs reviewed Antiemetics reviewed  Return to clinic in 1 week for toxicity evaluation.

## 2018-11-13 NOTE — Progress Notes (Signed)
Remote ICD transmission.   

## 2018-11-14 NOTE — Progress Notes (Signed)
Patient Care Team: Biagio Borg, MD as PCP - General Stanford Breed, Denice Bors, MD as Consulting Physician (Cardiology) Deboraha Sprang, MD as Consulting Physician (Cardiology) Rockwell Germany, RN as Oncology Nurse Navigator Mauro Kaufmann, RN as Oncology Nurse Navigator Nicholas Lose, MD as Consulting Physician (Hematology and Oncology) Stark Klein, MD as Consulting Physician (General Surgery) Eppie Gibson, MD as Attending Physician (Radiation Oncology)  DIAGNOSIS:    ICD-10-CM   1. Malignant neoplasm of lower-outer quadrant of right breast of female, estrogen receptor negative ([place]) C50.511    Z17.1     SUMMARY OF ONCOLOGIC HISTORY:   Malignant neoplasm of lower-outer quadrant of right breast of female, estrogen receptor negative ([place])   08/29/2018 Initial Diagnosis    Screening mammogram detected 2 adjacent masses right breast 8:30 position LOQ 8 cm from nipple 1.1 cm and 5 mm, combined mass 1.7 cm, axilla negative, biopsy: Grade 3 IDC triple negative, Ki-67 80%, T1CN0 stage 1B clinical stage    09/05/2018 Cancer Staging    Staging form: Breast, AJCC 8th Edition - Clinical stage from 09/05/2018: Stage IB (cT1c, cN0, cM0, G3, ER-, PR-, HER2-) - Signed by Nicholas Lose, MD on 09/05/2018    10/11/2018 Surgery    Right lumpectomy: Grade 3 IDC, 1.6 cm, 0/3 lymph nodes negative, superior margin broadly positive, triple negative, Ki-67 80%, T1CN0 stage Ib. Re-excision 10/23/18.    10/16/2018 Cancer Staging    Staging form: Breast, AJCC 8th Edition - Pathologic stage from 10/16/2018: Stage IB (pT1c, pN0, cM0, G3, ER-, PR-, HER2-) - Signed by Nicholas Lose, MD on 10/16/2018    11/15/2018 -  Chemotherapy    The patient had palonosetron (ALOXI) injection 0.25 mg, 0.25 mg, Intravenous,  Once, 0 of 6 cycles methotrexate (PF) chemo injection 84.5 mg, 40 mg/m2 = 84.5 mg, Intravenous,  Once, 0 of 6 cycles cyclophosphamide (CYTOXAN) 1,260 mg in sodium chloride 0.9 % 250 mL chemo infusion, 600 mg/m2 = 1,260  mg, Intravenous,  Once, 0 of 6 cycles fluorouracil (ADRUCIL) chemo injection 1,250 mg, 600 mg/m2 = 1,250 mg, Intravenous,  Once, 0 of 6 cycles  for chemotherapy treatment.      CHIEF COMPLIANT: Cycle 1 CMF  INTERVAL HISTORY: [name] [name] is a 78 y.o. with above-mentioned history of right breast cancer who underwent a lumpectomy snd re-excision. She presents to the clinic today to begin adjuvant chemotherapy with CMF.   REVIEW OF SYSTEMS:   Constitutional: Denies fevers, chills or abnormal weight loss Eyes: Denies blurriness of vision Ears, nose, mouth, throat, and face: Denies mucositis or sore throat Respiratory: Denies cough, dyspnea or wheezes Cardiovascular: Denies palpitation, chest discomfort Gastrointestinal: Denies nausea, heartburn or change in bowel habits Skin: Denies abnormal skin rashes Lymphatics: Denies new lymphadenopathy or easy bruising Neurological: Denies numbness, tingling or new weaknesses Behavioral/Psych: Mood is stable, no new changes  Extremities: No lower extremity edema Breast: denies any pain or lumps or nodules in either breasts All other systems were reviewed with the patient and are negative.  I have reviewed the past medical history, past surgical history, social history and family history with the patient and they are unchanged from previous note.  ALLERGIES:  is allergic to topamax [topiramate] and zocor [simvastatin - high dose].  MEDICATIONS:  Current Outpatient Medications  Medication Sig Dispense Refill  . albuterol (PROVENTIL HFA;VENTOLIN HFA) 108 (90 Base) MCG/ACT inhaler Inhale 1 puff into the lungs every 6 (six) hours as needed for wheezing or shortness of breath.    [name]  carvedilol (COREG) 25 MG tablet Take 25 mg by mouth 2 (two) times daily with a meal.     . cholecalciferol (VITAMIN D3) 25 MCG (1000 UT) tablet Take 1,000 Units by mouth daily.    [name] ENTRESTO 24-26 MG TAKE 1 TABLET BY MOUTH 2 (TWO) TIMES DAILY. 180 tablet 3  . furosemide  (LASIX) 40 MG tablet Take 40 mg by mouth 2 (two) times daily.    [name] lidocaine-prilocaine (EMLA) cream Apply to affected area once 30 g 3  . LORazepam (ATIVAN) 0.5 MG tablet Take 1 tablet (0.5 mg total) by mouth at bedtime as needed for sleep. 30 tablet 0  . Multiple Vitamin (MULTIVITAMIN WITH MINERALS) TABS tablet Take 1 tablet by mouth daily.    . ondansetron (ZOFRAN) 8 MG tablet Take 1 tablet (8 mg total) by mouth 2 (two) times daily as needed for refractory nausea / vomiting. Start on day 3 after chemotherapy. 30 tablet 1  . oxyCODONE (OXY IR/ROXICODONE) 5 MG immediate release tablet Take 0.5-1 tablets (2.5-5 mg total) by mouth every 6 (six) hours as needed for severe pain. 20 tablet 0  . prochlorperazine (COMPAZINE) 10 MG tablet Take 1 tablet (10 mg total) by mouth every 6 (six) hours as needed (Nausea or vomiting). 30 tablet 1  . rizatriptan (MAXALT) 10 MG tablet Take 10 mg by mouth as needed for migraine. May repeat in 2 hours if needed    . spironolactone (ALDACTONE) 25 MG tablet Take 0.5 tablets (12.5 mg total) by mouth daily. 45 tablet 3   No current facility-administered medications for this visit.     PHYSICAL EXAMINATION: ECOG PERFORMANCE STATUS: 1 - Symptomatic but completely ambulatory  Vitals:   11/15/18 1146  BP: (!) 126/54  Pulse: 70  Resp: 18  Temp: 98.1 F (36.7 C)   Filed Weights   11/15/18 1146  Weight: 213 lb 4.8 oz (96.8 kg)    GENERAL: alert, no distress and comfortable SKIN: skin color, texture, turgor are normal, no rashes or significant lesions EYES: normal, Conjunctiva are pink and non-injected, sclera clear OROPHARYNX: no exudate, no erythema and lips, buccal mucosa, and tongue normal  NECK: supple, thyroid normal size, non-tender, without nodularity LYMPH: no palpable lymphadenopathy in the cervical, axillary or inguinal LUNGS: clear to auscultation and percussion with normal breathing effort HEART: regular rate & rhythm and no murmurs and no lower  extremity edema ABDOMEN: abdomen soft, non-tender and normal bowel sounds MUSCULOSKELETAL: no cyanosis of digits and no clubbing  NEURO: alert & oriented x 3 with fluent speech, no focal motor/sensory deficits EXTREMITIES: No lower extremity edema  LABORATORY DATA:  I have reviewed the data as listed CMP Latest Ref Rng & Units 10/09/2018 09/05/2018 08/24/2018  Glucose 70 - 99 mg/dL 101(H) 95 111(H)  BUN 8 - 23 mg/dL 22 16 17   Creatinine 0.44 - 1.00 mg/dL 1.60(H) 1.44(H) 1.47(H)  Sodium 135 - 145 mmol/L 137 140 140  Potassium 3.5 - 5.1 mmol/L 4.4 4.2 4.8  Chloride 98 - 111 mmol/L 101 104 99  CO2 22 - 32 mmol/L 24 27 25   Calcium 8.9 - 10.3 mg/dL 9.6 8.9 9.4  Total Protein 6.5 - 8.1 g/dL - 7.4 -  Total Bilirubin 0.3 - 1.2 mg/dL - 0.4 -  Alkaline Phos 38 - 126 U/L - 85 -  AST 15 - 41 U/L - 12(L) -  ALT 0 - 44 U/L - 7 -    Lab Results  Component Value Date   WBC 7.5 11/15/2018  HGB 10.8 (L) 11/15/2018   HCT 34.0 (L) 11/15/2018   MCV 81.1 11/15/2018   PLT 227 11/15/2018   NEUTROABS 2.3 11/15/2018    ASSESSMENT & PLAN:  Malignant neoplasm of lower-outer quadrant of right breast of female, estrogen receptor negative ([facility]) 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%, T1CN0 stage Ib  Review: Superior margin was broadly positive: Patient had surgery 10/23/2018 awaiting the results  Treatment plan: 1.  Adjuvant chemotherapy with CMF x6 cycles tentative start date 11/15/2018 2.  Followed by adjuvant radiation ----------------------------------------------------------------------------------------------------------------------------------------------------- Current treatment: Cycle 1 CMF Labs reviewed Antiemetics reviewed  On review of the labs patient has elevated lymphocyte count of 4.5.  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  I will send the blood for flow cytometry.  Return to  clinic in 1 week for toxicity evaluation.    No orders of the defined types were placed in this encounter.  The patient has a good understanding of the overall plan. she agrees with it. she will call with any problems that may develop before the next visit here.  Nicholas Lose, MD 11/15/2018  Julious Oka Dorshimer am acting as scribe for Dr. Nicholas Lose.  I have reviewed the above documentation for accuracy and completeness, and I agree with the above.

## 2018-11-15 ENCOUNTER — Other Ambulatory Visit: Payer: Self-pay

## 2018-11-15 ENCOUNTER — Inpatient Hospital Stay: Payer: Medicare Other | Attending: Hematology and Oncology

## 2018-11-15 ENCOUNTER — Ambulatory Visit (HOSPITAL_COMMUNITY)
Admission: RE | Admit: 2018-11-15 | Discharge: 2018-11-15 | Disposition: A | Discharge status: Home / Self Care | Payer: Medicare Other | Source: Ambulatory Visit | Attending: Hematology and Oncology | Admitting: Hematology and Oncology

## 2018-11-15 ENCOUNTER — Inpatient Hospital Stay: Payer: Medicare Other

## 2018-11-15 ENCOUNTER — Inpatient Hospital Stay (HOSPITAL_BASED_OUTPATIENT_CLINIC_OR_DEPARTMENT_OTHER): Payer: Medicare Other | Admitting: Hematology and Oncology

## 2018-11-15 VITALS — BP 126/54 | HR 70 | Temp 98.1°F | Resp 18 | Ht 67.0 in | Wt 213.3 lb

## 2018-11-15 DIAGNOSIS — C50511 Malignant neoplasm of lower-outer quadrant of right female breast: Secondary | ICD-10-CM | POA: Diagnosis not present

## 2018-11-15 DIAGNOSIS — R110 Nausea: Secondary | ICD-10-CM | POA: Insufficient documentation

## 2018-11-15 DIAGNOSIS — Z452 Encounter for adjustment and management of vascular access device: Secondary | ICD-10-CM | POA: Diagnosis not present

## 2018-11-15 DIAGNOSIS — Z95828 Presence of other vascular implants and grafts: Secondary | ICD-10-CM

## 2018-11-15 DIAGNOSIS — Z171 Estrogen receptor negative status [ER-]: Secondary | ICD-10-CM | POA: Insufficient documentation

## 2018-11-15 DIAGNOSIS — Z5111 Encounter for antineoplastic chemotherapy: Secondary | ICD-10-CM | POA: Insufficient documentation

## 2018-11-15 DIAGNOSIS — Z79899 Other long term (current) drug therapy: Secondary | ICD-10-CM

## 2018-11-15 DIAGNOSIS — N183 Chronic kidney disease, stage 3 (moderate): Secondary | ICD-10-CM | POA: Insufficient documentation

## 2018-11-15 DIAGNOSIS — D72820 Lymphocytosis (symptomatic): Secondary | ICD-10-CM

## 2018-11-15 DIAGNOSIS — R5383 Other fatigue: Secondary | ICD-10-CM | POA: Insufficient documentation

## 2018-11-15 DIAGNOSIS — D72829 Elevated white blood cell count, unspecified: Secondary | ICD-10-CM

## 2018-11-15 LAB — CMP (CANCER CENTER ONLY)
ALT: 9 U/L (ref 0–44)
AST: 13 U/L — ABNORMAL LOW (ref 15–41)
Albumin: 3.8 g/dL (ref 3.5–5.0)
Alkaline Phosphatase: 83 U/L (ref 38–126)
Anion gap: 9 (ref 5–15)
BUN: 18 mg/dL (ref 8–23)
CO2: 26 mmol/L (ref 22–32)
Calcium: 9.2 mg/dL (ref 8.9–10.3)
Chloride: 104 mmol/L (ref 98–111)
Creatinine: 1.39 mg/dL — ABNORMAL HIGH (ref 0.44–1.00)
GFR, Est AFR Am: 43 mL/min — ABNORMAL LOW (ref >60)
GFR, Estimated: 37 mL/min — ABNORMAL LOW (ref >60)
Glucose, Bld: 92 mg/dL (ref 70–99)
Potassium: 4.5 mmol/L (ref 3.5–5.1)
Sodium: 139 mmol/L (ref 135–145)
Total Bilirubin: 0.3 mg/dL (ref 0.3–1.2)
Total Protein: 7.4 g/dL (ref 6.5–8.1)

## 2018-11-15 LAB — CBC WITH DIFFERENTIAL (CANCER CENTER ONLY)
Abs Immature Granulocytes: 0.01 10*3/uL (ref 0.00–0.07)
Basophils Absolute: 0 10*3/uL (ref 0.0–0.1)
Basophils Relative: 1 %
Eosinophils Absolute: 0.2 10*3/uL (ref 0.0–0.5)
Eosinophils Relative: 3 %
HCT: 34 % — ABNORMAL LOW (ref 36.0–46.0)
Hemoglobin: 10.8 g/dL — ABNORMAL LOW (ref 12.0–15.0)
Immature Granulocytes: 0 %
Lymphocytes Relative: 59 %
Lymphs Abs: 4.5 10*3/uL — ABNORMAL HIGH (ref 0.7–4.0)
MCH: 25.8 pg — ABNORMAL LOW (ref 26.0–34.0)
MCHC: 31.8 g/dL (ref 30.0–36.0)
MCV: 81.1 fL (ref 80.0–100.0)
Monocytes Absolute: 0.5 10*3/uL (ref 0.1–1.0)
Monocytes Relative: 7 %
Neutro Abs: 2.3 10*3/uL (ref 1.7–7.7)
Neutrophils Relative %: 30 %
Platelet Count: 227 10*3/uL (ref 150–400)
RBC: 4.19 MIL/uL (ref 3.87–5.11)
RDW: 15.3 % (ref 11.5–15.5)
WBC Count: 7.5 10*3/uL (ref 4.0–10.5)
nRBC: 0 % (ref 0.0–0.2)

## 2018-11-15 MED ORDER — HEPARIN SOD (PORK) LOCK FLUSH 100 UNIT/ML IV SOLN
500.0000 [IU] | Freq: Once | INTRAVENOUS | Status: AC | PRN
  Administered 2018-11-15: 16:00:00 500 [IU]
  Filled 2018-11-15: qty 5

## 2018-11-15 MED ORDER — METHOTREXATE SODIUM (PF) CHEMO INJECTION 250 MG/10ML
65.0000 mg | Freq: Once | INTRAMUSCULAR | Status: AC
  Administered 2018-11-15: 15:00:00 65 mg via INTRAVENOUS
  Filled 2018-11-15: qty 2.6

## 2018-11-15 MED ORDER — SODIUM CHLORIDE 0.9 % IV SOLN
600.0000 mg/m2 | Freq: Once | INTRAVENOUS | Status: AC
  Administered 2018-11-15: 15:00:00 1260 mg via INTRAVENOUS
  Filled 2018-11-15: qty 63

## 2018-11-15 MED ORDER — FLUOROURACIL CHEMO INJECTION 2.5 GM/50ML
600.0000 mg/m2 | Freq: Once | INTRAVENOUS | Status: AC
  Administered 2018-11-15: 15:00:00 1250 mg via INTRAVENOUS
  Filled 2018-11-15: qty 25

## 2018-11-15 MED ORDER — PALONOSETRON HCL INJECTION 0.25 MG/5ML
INTRAVENOUS | Status: AC
  Filled 2018-11-15: qty 5

## 2018-11-15 MED ORDER — PALONOSETRON HCL INJECTION 0.25 MG/5ML
0.2500 mg | Freq: Once | INTRAVENOUS | Status: AC
  Administered 2018-11-15: 14:00:00 0.25 mg via INTRAVENOUS

## 2018-11-15 MED ORDER — SODIUM CHLORIDE 0.9% FLUSH
10.0000 mL | Freq: Once | INTRAVENOUS | Status: AC
  Administered 2018-11-15: 10 mL
  Filled 2018-11-15: qty 10

## 2018-11-15 MED ORDER — SODIUM CHLORIDE 0.9 % IV SOLN
Freq: Once | INTRAVENOUS | Status: AC
  Administered 2018-11-15: 14:00:00 via INTRAVENOUS
  Filled 2018-11-15: qty 250

## 2018-11-15 MED ORDER — SODIUM CHLORIDE 0.9% FLUSH
10.0000 mL | INTRAVENOUS | Status: DC | PRN
  Filled 2018-11-15: qty 10

## 2018-11-15 MED ORDER — DEXAMETHASONE SODIUM PHOSPHATE 10 MG/ML IJ SOLN
10.0000 mg | Freq: Once | INTRAMUSCULAR | Status: AC
  Administered 2018-11-15: 14:00:00 10 mg via INTRAVENOUS

## 2018-11-15 MED ORDER — DEXAMETHASONE SODIUM PHOSPHATE 10 MG/ML IJ SOLN
INTRAMUSCULAR | Status: AC
  Filled 2018-11-15: qty 1

## 2018-11-15 NOTE — Progress Notes (Signed)
11/15/18, Scr 1.39, CrCl = 53 ml/min ok to decrease Methotrexate to 30mg /m2 per Dr. Lindi Adie.  B.Donnalyn Juran, PharmD, BCPS, BCOP

## 2018-11-15 NOTE — Progress Notes (Signed)
Checked on pt while in infusion room to see if she had any questions about education & she reports that she thinks she has the information that she needs.

## 2018-11-15 NOTE — Patient Instructions (Signed)
[facility] Discharge Instructions for Patients Receiving Chemotherapy  Today you received the following chemotherapy agents:  Cyclophosphamide, methatrexate, fluorouracil  To help prevent nausea and vomiting after your treatment, we encourage you to take your nausea medication as prescribed.   If you develop nausea and vomiting that is not controlled by your nausea medication, call the clinic.   BELOW ARE SYMPTOMS THAT SHOULD BE REPORTED IMMEDIATELY:  *FEVER GREATER THAN 100.5 F  *CHILLS WITH OR WITHOUT FEVER  NAUSEA AND VOMITING THAT IS NOT CONTROLLED WITH YOUR NAUSEA MEDICATION  *UNUSUAL SHORTNESS OF BREATH  *UNUSUAL BRUISING OR BLEEDING  TENDERNESS IN MOUTH AND THROAT WITH OR WITHOUT PRESENCE OF ULCERS  *URINARY PROBLEMS  *BOWEL PROBLEMS  UNUSUAL RASH Items with * indicate a potential emergency and should be followed up as soon as possible.  Feel free to call the clinic should you have any questions or concerns. The clinic phone number is [phone].  Please show the [facility] at check-in to the Emergency Department and triage nurse.

## 2018-11-16 ENCOUNTER — Telehealth: Payer: Self-pay | Admitting: *Deleted

## 2018-11-16 ENCOUNTER — Encounter: Payer: Self-pay | Admitting: General Practice

## 2018-11-16 NOTE — Telephone Encounter (Signed)
Called and left message for a return phone call to check in after patient's 1st chemo.

## 2018-11-16 NOTE — Progress Notes (Signed)
[facility] CSW Progress Notes  Request from RN to call patient to discuss community resources. Per RN, patient had heard another patient talking about having a family member paid to provide in home care.  Wanted to know if her daughter could be paid to provide care for her.  CSW left VM on home and mobile number, asking patient to return call.  Will explain to patient that this would likely be happening under CAP which is a [place] funded service that may pay family member to provide care - patient does not currently have [place]icaid, so she would not be eligible.  CSW can direct patient to DSS where she can apply for [place]icaid and also indicate that she is interested in getting on wait list for CAP program.  Can also be referred to community resource navigator at [employer] who can outline private pay options available in the community for in home care - these are services available regardless of insurance.  If patient calls back, CSW can provide information on these options and refer as appropriate.    Edwyna Shell, LCSW Clinical Social Worker Phone:  [phone]

## 2018-11-16 NOTE — Telephone Encounter (Signed)
Called pt to see how she was feeling after her first dose of CMF yesterday.  Pt states she is asymptomatic.  I reinforced education on drinking plenty of fluids and to monitor her temperature.  I also educated pt to call us if she does start to experience any symptoms such as severe nausea, mouth sores, diarrhea, constipation, or anything not normal for her.  Pt also states she wanted some information regarding her daughter getting paid to help take care of her.  I told the pt that I will reach out to social work and have them follow up with her regarding that. Pt verbalized understanding and appreciative of call.

## 2018-11-19 NOTE — Assessment & Plan Note (Signed)
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

## 2018-11-20 ENCOUNTER — Encounter: Payer: Self-pay | Admitting: General Practice

## 2018-11-20 NOTE — Progress Notes (Signed)
[facility] CSW Progress Notes  Call to patient, confirmed eligibility requirements for in home personal care services.  Patient is not on Medicaid and does not want to apply.  Edwyna Shell, LCSW Clinical Social Worker Phone:  [phone]

## 2018-11-21 NOTE — Progress Notes (Signed)
Patient Care Team: Biagio Borg, MD as PCP - General Stanford Breed, Denice Bors, MD as Consulting Physician (Cardiology) Deboraha Sprang, MD as Consulting Physician (Cardiology) Rockwell Germany, RN as Oncology Nurse Navigator Mauro Kaufmann, RN as Oncology Nurse Navigator Nicholas Lose, MD as Consulting Physician (Hematology and Oncology) Stark Klein, MD as Consulting Physician (General Surgery) Eppie Gibson, MD as Attending Physician (Radiation Oncology)  DIAGNOSIS:    ICD-10-CM   1. Malignant neoplasm of lower-outer quadrant of right breast of female, estrogen receptor negative ([place]) C50.511    Z17.1     SUMMARY OF ONCOLOGIC HISTORY:   Malignant neoplasm of lower-outer quadrant of right breast of female, estrogen receptor negative ([place])   08/29/2018 Initial Diagnosis    Screening mammogram detected 2 adjacent masses right breast 8:30 position LOQ 8 cm from nipple 1.1 cm and 5 mm, combined mass 1.7 cm, axilla negative, biopsy: Grade 3 IDC triple negative, Ki-67 80%, T1CN0 stage 1B clinical stage    09/05/2018 Cancer Staging    Staging form: Breast, AJCC 8th Edition - Clinical stage from 09/05/2018: Stage IB (cT1c, cN0, cM0, G3, ER-, PR-, HER2-) - Signed by Nicholas Lose, MD on 09/05/2018    10/11/2018 Surgery    Right lumpectomy: Grade 3 IDC, 1.6 cm, 0/3 lymph nodes negative, superior margin broadly positive, triple negative, Ki-67 80%, T1CN0 stage Ib. Re-excision 10/23/18.    10/16/2018 Cancer Staging    Staging form: Breast, AJCC 8th Edition - Pathologic stage from 10/16/2018: Stage IB (pT1c, pN0, cM0, G3, ER-, PR-, HER2-) - Signed by Nicholas Lose, MD on 10/16/2018    11/15/2018 -  Chemotherapy    The patient had palonosetron (ALOXI) injection 0.25 mg, 0.25 mg, Intravenous,  Once, 1 of 6 cycles Administration: 0.25 mg (11/15/2018) methotrexate (PF) chemo injection 65 mg, 84.5 mg, Intravenous,  Once, 1 of 6 cycles Dose modification: 30 mg/m2 (original dose 40 mg/m2, Cycle 2, Reason: Change in  SCr/CrCl, Comment: CrCl 53) Administration: 65 mg (11/15/2018) cyclophosphamide (CYTOXAN) 1,260 mg in sodium chloride 0.9 % 250 mL chemo infusion, 600 mg/m2 = 1,260 mg, Intravenous,  Once, 1 of 6 cycles Administration: 1,260 mg (11/15/2018) fluorouracil (ADRUCIL) chemo injection 1,250 mg, 600 mg/m2 = 1,250 mg, Intravenous,  Once, 1 of 6 cycles Administration: 1,250 mg (11/15/2018)  for chemotherapy treatment.      CHIEF COMPLIANT: Cycle 1 day 8 CMF  INTERVAL HISTORY: [name] [name] is a 78 y.o. with above-mentioned history of right breast cancer who underwent a lumpectomy and re-excision. She is currently on adjuvant chemotherapy with CMF. She presents to the clinic today today for cycle 1 day 8 of chemo.  She tolerated chemotherapy extremely well.  She had mild nausea for couple of days.  She did not take any antiemetics.  REVIEW OF SYSTEMS:   Constitutional: Denies fevers, chills or abnormal weight loss Eyes: Denies blurriness of vision Ears, nose, mouth, throat, and face: Denies mucositis or sore throat Respiratory: Denies cough, dyspnea or wheezes Cardiovascular: Denies palpitation, chest discomfort Gastrointestinal: Mild nausea Skin: Denies abnormal skin rashes Lymphatics: Denies new lymphadenopathy or easy bruising Neurological: Denies numbness, tingling or new weaknesses Behavioral/Psych: Mood is stable, no new changes  Extremities: No lower extremity edema Breast: denies any pain or lumps or nodules in either breasts All other systems were reviewed with the patient and are negative.  I have reviewed the past medical history, past surgical history, social history and family history with the patient and they are unchanged from previous note.  ALLERGIES:  is allergic to topamax [topiramate] and zocor [simvastatin - high dose].  MEDICATIONS:  Current Outpatient Medications  Medication Sig Dispense Refill  . albuterol (PROVENTIL HFA;VENTOLIN HFA) 108 (90 Base) MCG/ACT inhaler Inhale  1 puff into the lungs every 6 (six) hours as needed for wheezing or shortness of breath.    . carvedilol (COREG) 25 MG tablet Take 25 mg by mouth 2 (two) times daily with a meal.     . cholecalciferol (VITAMIN D3) 25 MCG (1000 UT) tablet Take 1,000 Units by mouth daily.    [name] ENTRESTO 24-26 MG TAKE 1 TABLET BY MOUTH 2 (TWO) TIMES DAILY. 180 tablet 3  . furosemide (LASIX) 40 MG tablet Take 40 mg by mouth 2 (two) times daily.    [name] lidocaine-prilocaine (EMLA) cream Apply to affected area once 30 g 3  . LORazepam (ATIVAN) 0.5 MG tablet Take 1 tablet (0.5 mg total) by mouth at bedtime as needed for sleep. 30 tablet 0  . Multiple Vitamin (MULTIVITAMIN WITH MINERALS) TABS tablet Take 1 tablet by mouth daily.    . ondansetron (ZOFRAN) 8 MG tablet Take 1 tablet (8 mg total) by mouth 2 (two) times daily as needed for refractory nausea / vomiting. Start on day 3 after chemotherapy. 30 tablet 1  . oxyCODONE (OXY IR/ROXICODONE) 5 MG immediate release tablet Take 0.5-1 tablets (2.5-5 mg total) by mouth every 6 (six) hours as needed for severe pain. 20 tablet 0  . prochlorperazine (COMPAZINE) 10 MG tablet Take 1 tablet (10 mg total) by mouth every 6 (six) hours as needed (Nausea or vomiting). 30 tablet 1  . rizatriptan (MAXALT) 10 MG tablet Take 10 mg by mouth as needed for migraine. May repeat in 2 hours if needed    . spironolactone (ALDACTONE) 25 MG tablet Take 0.5 tablets (12.5 mg total) by mouth daily. 45 tablet 3   No current facility-administered medications for this visit.     PHYSICAL EXAMINATION: ECOG PERFORMANCE STATUS: 1 - Symptomatic but completely ambulatory  Vitals:   11/22/18 1050  BP: 130/67  Pulse: 85  Resp: 17  Temp: 98.6 F (37 C)  SpO2: 100%   Filed Weights   11/22/18 1050  Weight: 214 lb 6.4 oz (97.3 kg)    GENERAL: alert, no distress and comfortable SKIN: skin color, texture, turgor are normal, no rashes or significant lesions EYES: normal, Conjunctiva are pink and  non-injected, sclera clear OROPHARYNX: no exudate, no erythema and lips, buccal mucosa, and tongue normal  NECK: supple, thyroid normal size, non-tender, without nodularity LYMPH: no palpable lymphadenopathy in the cervical, axillary or inguinal LUNGS: clear to auscultation and percussion with normal breathing effort HEART: regular rate & rhythm and no murmurs and no lower extremity edema ABDOMEN: abdomen soft, non-tender and normal bowel sounds MUSCULOSKELETAL: no cyanosis of digits and no clubbing  NEURO: alert & oriented x 3 with fluent speech, no focal motor/sensory deficits EXTREMITIES: No lower extremity edema  LABORATORY DATA:  I have reviewed the data as listed CMP Latest Ref Rng & Units 11/15/2018 10/09/2018 09/05/2018  Glucose 70 - 99 mg/dL 92 101(H) 95  BUN 8 - 23 mg/dL 18 22 16   Creatinine 0.44 - 1.00 mg/dL 1.39(H) 1.60(H) 1.44(H)  Sodium 135 - 145 mmol/L 139 137 140  Potassium 3.5 - 5.1 mmol/L 4.5 4.4 4.2  Chloride 98 - 111 mmol/L 104 101 104  CO2 22 - 32 mmol/L 26 24 27   Calcium 8.9 - 10.3 mg/dL 9.2 9.6 8.9  Total Protein  6.5 - 8.1 g/dL 7.4 - 7.4  Total Bilirubin 0.3 - 1.2 mg/dL 0.3 - 0.4  Alkaline Phos 38 - 126 U/L 83 - 85  AST 15 - 41 U/L 13(L) - 12(L)  ALT 0 - 44 U/L 9 - 7    Lab Results  Component Value Date   WBC 6.3 11/22/2018   HGB 10.6 (L) 11/22/2018   HCT 33.3 (L) 11/22/2018   MCV 82.0 11/22/2018   PLT 156 11/22/2018   NEUTROABS 1.9 11/22/2018    ASSESSMENT & PLAN:  Malignant neoplasm of lower-outer quadrant of right breast of female, estrogen receptor negative ([facility]) 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%, T1CN0 stage Ib  Review: Superior marginwas broadly positive: Patient had surgery 10/23/2018 awaiting the results  Treatment plan: 1.Adjuvant chemotherapy with CMF x6 cycles tentative start date 11/15/2018 2.Followed by adjuvant radiation  ----------------------------------------------------------------------------------------------------------------------------------------------------- Current treatment: Cycle 1 day 8 CMF Chemo toxicities: 1.  Mild nausea: Patient did not take any antiemetics 2.  Mild fatigue  Chronic kidney disease: We are to reduce the dosage of methotrexate for low GFR. 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  The patient has a good understanding of the overall plan. she agrees with it. she will call with any problems that may develop before the next visit here.  Nicholas Lose, MD 11/22/2018  Julious Oka Dorshimer am acting as scribe for Dr. Nicholas Lose.  I have reviewed the above documentation for accuracy and completeness, and I agree with the above.

## 2018-11-22 ENCOUNTER — Inpatient Hospital Stay: Payer: Medicare Other

## 2018-11-22 ENCOUNTER — Other Ambulatory Visit: Payer: Self-pay

## 2018-11-22 ENCOUNTER — Inpatient Hospital Stay (HOSPITAL_BASED_OUTPATIENT_CLINIC_OR_DEPARTMENT_OTHER): Payer: Medicare Other | Admitting: Hematology and Oncology

## 2018-11-22 DIAGNOSIS — R110 Nausea: Secondary | ICD-10-CM

## 2018-11-22 DIAGNOSIS — Z171 Estrogen receptor negative status [ER-]: Secondary | ICD-10-CM

## 2018-11-22 DIAGNOSIS — Z79899 Other long term (current) drug therapy: Secondary | ICD-10-CM

## 2018-11-22 DIAGNOSIS — N183 Chronic kidney disease, stage 3 (moderate): Secondary | ICD-10-CM

## 2018-11-22 DIAGNOSIS — C50511 Malignant neoplasm of lower-outer quadrant of right female breast: Secondary | ICD-10-CM

## 2018-11-22 DIAGNOSIS — Z5111 Encounter for antineoplastic chemotherapy: Secondary | ICD-10-CM | POA: Diagnosis not present

## 2018-11-22 DIAGNOSIS — D72829 Elevated white blood cell count, unspecified: Secondary | ICD-10-CM

## 2018-11-22 DIAGNOSIS — R5383 Other fatigue: Secondary | ICD-10-CM

## 2018-11-22 LAB — CMP (CANCER CENTER ONLY)
ALT: 11 U/L (ref 0–44)
AST: 13 U/L — ABNORMAL LOW (ref 15–41)
Albumin: 3.8 g/dL (ref 3.5–5.0)
Alkaline Phosphatase: 85 U/L (ref 38–126)
Anion gap: 9 (ref 5–15)
BUN: 18 mg/dL (ref 8–23)
CO2: 28 mmol/L (ref 22–32)
Calcium: 9 mg/dL (ref 8.9–10.3)
Chloride: 102 mmol/L (ref 98–111)
Creatinine: 1.45 mg/dL — ABNORMAL HIGH (ref 0.44–1.00)
GFR, Est AFR Am: 40 mL/min — ABNORMAL LOW (ref >60)
GFR, Estimated: 35 mL/min — ABNORMAL LOW (ref >60)
Glucose, Bld: 102 mg/dL — ABNORMAL HIGH (ref 70–99)
Potassium: 5.2 mmol/L — ABNORMAL HIGH (ref 3.5–5.1)
Sodium: 139 mmol/L (ref 135–145)
Total Bilirubin: 0.4 mg/dL (ref 0.3–1.2)
Total Protein: 7.2 g/dL (ref 6.5–8.1)

## 2018-11-22 LAB — CBC WITH DIFFERENTIAL (CANCER CENTER ONLY)
Abs Immature Granulocytes: 0.01 10*3/uL (ref 0.00–0.07)
Basophils Absolute: 0 10*3/uL (ref 0.0–0.1)
Basophils Relative: 1 %
Eosinophils Absolute: 0.1 10*3/uL (ref 0.0–0.5)
Eosinophils Relative: 2 %
HCT: 33.3 % — ABNORMAL LOW (ref 36.0–46.0)
Hemoglobin: 10.6 g/dL — ABNORMAL LOW (ref 12.0–15.0)
Immature Granulocytes: 0 %
Lymphocytes Relative: 64 %
Lymphs Abs: 4.1 10*3/uL — ABNORMAL HIGH (ref 0.7–4.0)
MCH: 26.1 pg (ref 26.0–34.0)
MCHC: 31.8 g/dL (ref 30.0–36.0)
MCV: 82 fL (ref 80.0–100.0)
Monocytes Absolute: 0.2 10*3/uL (ref 0.1–1.0)
Monocytes Relative: 3 %
Neutro Abs: 1.9 10*3/uL (ref 1.7–7.7)
Neutrophils Relative %: 30 %
Platelet Count: 156 10*3/uL (ref 150–400)
RBC: 4.06 MIL/uL (ref 3.87–5.11)
RDW: 14.8 % (ref 11.5–15.5)
WBC Count: 6.3 10*3/uL (ref 4.0–10.5)
nRBC: 0 % (ref 0.0–0.2)

## 2018-11-23 LAB — FLOW CYTOMETRY

## 2018-11-29 NOTE — Assessment & Plan Note (Signed)
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%, T1CN0 stage Ib  Review: Superior marginwas broadly positive: Patient had surgery 10/23/2018 awaiting the results  Treatment plan: 1.Adjuvant chemotherapy with CMF x6 cycles tentative start date 11/15/2018 2.Followed by adjuvant radiation ----------------------------------------------------------------------------------------------------------------------------------------------------- Current treatment: Cycle 2 CMF Chemo toxicities: 1.  Mild nausea: Patient did not take any antiemetics 2.  Mild fatigue  Chronic kidney disease: We are to reduce the dosage of methotrexate for low GFR. 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

## 2018-12-05 NOTE — Progress Notes (Signed)
Patient Care Team: Biagio Borg, MD as PCP - General Stanford Breed, Denice Bors, MD as Consulting Physician (Cardiology) Deboraha Sprang, MD as Consulting Physician (Cardiology) Rockwell Germany, RN as Oncology Nurse Navigator Mauro Kaufmann, RN as Oncology Nurse Navigator Nicholas Lose, MD as Consulting Physician (Hematology and Oncology) Stark Klein, MD as Consulting Physician (General Surgery) Eppie Gibson, MD as Attending Physician (Radiation Oncology)  DIAGNOSIS:    ICD-10-CM   1. Malignant neoplasm of lower-outer quadrant of right breast of female, estrogen receptor negative ([place]) C50.511    Z17.1     SUMMARY OF ONCOLOGIC HISTORY:   Malignant neoplasm of lower-outer quadrant of right breast of female, estrogen receptor negative ([place])   08/29/2018 Initial Diagnosis    Screening mammogram detected 2 adjacent masses right breast 8:30 position LOQ 8 cm from nipple 1.1 cm and 5 mm, combined mass 1.7 cm, axilla negative, biopsy: Grade 3 IDC triple negative, Ki-67 80%, T1CN0 stage 1B clinical stage    09/05/2018 Cancer Staging    Staging form: Breast, AJCC 8th Edition - Clinical stage from 09/05/2018: Stage IB (cT1c, cN0, cM0, G3, ER-, PR-, HER2-) - Signed by Nicholas Lose, MD on 09/05/2018    10/11/2018 Surgery    Right lumpectomy: Grade 3 IDC, 1.6 cm, 0/3 lymph nodes negative, superior margin broadly positive, triple negative, Ki-67 80%, T1CN0 stage Ib. Re-excision 10/23/18.    10/16/2018 Cancer Staging    Staging form: Breast, AJCC 8th Edition - Pathologic stage from 10/16/2018: Stage IB (pT1c, pN0, cM0, G3, ER-, PR-, HER2-) - Signed by Nicholas Lose, MD on 10/16/2018    11/15/2018 -  Chemotherapy    The patient had palonosetron (ALOXI) injection 0.25 mg, 0.25 mg, Intravenous,  Once, 1 of 6 cycles Administration: 0.25 mg (11/15/2018) methotrexate (PF) chemo injection 65 mg, 84.5 mg, Intravenous,  Once, 1 of 6 cycles Dose modification: 30 mg/m2 (original dose 40 mg/m2, Cycle 2, Reason: Change in  SCr/CrCl, Comment: CrCl 53) Administration: 65 mg (11/15/2018) cyclophosphamide (CYTOXAN) 1,260 mg in sodium chloride 0.9 % 250 mL chemo infusion, 600 mg/m2 = 1,260 mg, Intravenous,  Once, 1 of 6 cycles Administration: 1,260 mg (11/15/2018) fluorouracil (ADRUCIL) chemo injection 1,250 mg, 600 mg/m2 = 1,250 mg, Intravenous,  Once, 1 of 6 cycles Administration: 1,250 mg (11/15/2018)  for chemotherapy treatment.      CHIEF COMPLIANT: Cycle 2 Day 1 CMF  INTERVAL HISTORY: [name] [name] is a 78 y.o. with above-mentioned history of right breast cancer who underwent a lumpectomyandre-excision. She is currently on adjuvant chemotherapy with CMF. She presents to the clinic today today for cycle 2 day 1.   REVIEW OF SYSTEMS:   Constitutional: Denies fevers, chills or abnormal weight loss Eyes: Denies blurriness of vision Ears, nose, mouth, throat, and face: Denies mucositis or sore throat Respiratory: Denies cough, dyspnea or wheezes Cardiovascular: Denies palpitation, chest discomfort Gastrointestinal: Denies nausea, heartburn or change in bowel habits Skin: Denies abnormal skin rashes Lymphatics: Denies new lymphadenopathy or easy bruising Neurological: Denies numbness, tingling or new weaknesses Behavioral/Psych: Mood is stable, no new changes  Extremities: No lower extremity edema Breast: denies any pain or lumps or nodules in either breasts All other systems were reviewed with the patient and are negative.  I have reviewed the past medical history, past surgical history, social history and family history with the patient and they are unchanged from previous note.  ALLERGIES:  is allergic to topamax [topiramate] and zocor [simvastatin - high dose].  MEDICATIONS:  Current Outpatient  Medications  Medication Sig Dispense Refill  . albuterol (PROVENTIL HFA;VENTOLIN HFA) 108 (90 Base) MCG/ACT inhaler Inhale 1 puff into the lungs every 6 (six) hours as needed for wheezing or shortness of breath.     . carvedilol (COREG) 25 MG tablet Take 25 mg by mouth 2 (two) times daily with a meal.     . cholecalciferol (VITAMIN D3) 25 MCG (1000 UT) tablet Take 1,000 Units by mouth daily.    [name] ENTRESTO 24-26 MG TAKE 1 TABLET BY MOUTH 2 (TWO) TIMES DAILY. 180 tablet 3  . furosemide (LASIX) 40 MG tablet Take 40 mg by mouth 2 (two) times daily.    [name] lidocaine-prilocaine (EMLA) cream Apply to affected area once 30 g 3  . LORazepam (ATIVAN) 0.5 MG tablet Take 1 tablet (0.5 mg total) by mouth at bedtime as needed for sleep. 30 tablet 0  . Multiple Vitamin (MULTIVITAMIN WITH MINERALS) TABS tablet Take 1 tablet by mouth daily.    . ondansetron (ZOFRAN) 8 MG tablet Take 1 tablet (8 mg total) by mouth 2 (two) times daily as needed for refractory nausea / vomiting. Start on day 3 after chemotherapy. 30 tablet 1  . oxyCODONE (OXY IR/ROXICODONE) 5 MG immediate release tablet Take 0.5-1 tablets (2.5-5 mg total) by mouth every 6 (six) hours as needed for severe pain. 20 tablet 0  . prochlorperazine (COMPAZINE) 10 MG tablet Take 1 tablet (10 mg total) by mouth every 6 (six) hours as needed (Nausea or vomiting). 30 tablet 1  . rizatriptan (MAXALT) 10 MG tablet Take 10 mg by mouth as needed for migraine. May repeat in 2 hours if needed    . spironolactone (ALDACTONE) 25 MG tablet Take 0.5 tablets (12.5 mg total) by mouth daily. 45 tablet 3   No current facility-administered medications for this visit.     PHYSICAL EXAMINATION: ECOG PERFORMANCE STATUS: 1 - Symptomatic but completely ambulatory  Vitals:   12/06/18 0952  BP: (!) 114/54  Pulse: 66  Resp: 17  Temp: 98.9 F (37.2 C)  SpO2: 100%   Filed Weights   12/06/18 0952  Weight: 217 lb 4.8 oz (98.6 kg)    GENERAL: alert, no distress and comfortable SKIN: skin color, texture, turgor are normal, no rashes or significant lesions EYES: normal, Conjunctiva are pink and non-injected, sclera clear OROPHARYNX: no exudate, no erythema and lips, buccal mucosa,  and tongue normal  NECK: supple, thyroid normal size, non-tender, without nodularity LYMPH: no palpable lymphadenopathy in the cervical, axillary or inguinal LUNGS: clear to auscultation and percussion with normal breathing effort HEART: regular rate & rhythm and no murmurs and no lower extremity edema ABDOMEN: abdomen soft, non-tender and normal bowel sounds MUSCULOSKELETAL: no cyanosis of digits and no clubbing  NEURO: alert & oriented x 3 with fluent speech, no focal motor/sensory deficits EXTREMITIES: No lower extremity edema  LABORATORY DATA:  I have reviewed the data as listed CMP Latest Ref Rng & Units 11/22/2018 11/15/2018 10/09/2018  Glucose 70 - 99 mg/dL 102(H) 92 101(H)  BUN 8 - 23 mg/dL _0 Creatinine 0.44 - 1.00 mg/dL 1.45(H) 1.39(H) 1.60(H)  Sodium 135 - 145 mmol/L 139 139 137  Potassium 3.5 - 5.1 mmol/L 5.2(H) 4.5 4.4  Chloride 98 - 111 mmol/L 102 104 101  CO2 22 - 32 mmol/L _1 Calcium 8.9 - 10.3 mg/dL 9.0 9.2 9.6  Total Protein 6.5 - 8.1 g/dL 7.2 7.4 -  Total Bilirubin 0.3 - 1.2 mg/dL 0.4 0.3 -  Alkaline Phos 38 - 126 U/L 85 83 -  AST 15 - 41 U/L 13(L) 13(L) -  ALT 0 - 44 U/L 11 9 -    Lab Results  Component Value Date   WBC 5.9 12/06/2018   HGB 10.4 (L) 12/06/2018   HCT 32.4 (L) 12/06/2018   MCV 81.0 12/06/2018   PLT 283 12/06/2018   NEUTROABS 1.7 12/06/2018    ASSESSMENT & PLAN:  Malignant neoplasm of lower-outer quadrant of right breast of female, estrogen receptor negative ([facility]) 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%, T1CN0 stage Ib  Review: Superior marginwas broadly positive: Patient had surgery 10/23/2018 awaiting the results  Treatment plan: 1.Adjuvant chemotherapy with CMF x6 cycles tentative start date 11/15/2018 2.Followed by adjuvant radiation  ----------------------------------------------------------------------------------------------------------------------------------------------------- Current treatment: Cycle 2 CMF Chemo toxicities: 1.  Mild nausea: Patient did not take any antiemetics 2.  Worsening fatigue: Over the past week she has gotten more fatigue. She tells me that she has lost appetite for junk food. She has been drinking more water.  Chronic kidney disease: We are to reduce the dosage of methotrexate for low GFR. 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  The patient has a good understanding of the overall plan. she agrees with it. she will call with any problems that may develop before the next visit here.  Nicholas Lose, MD 12/06/2018  [name] [name] am acting as scribe for Dr. Nicholas Lose.  I have reviewed the above documentation for accuracy and completeness, and I agree with the above.

## 2018-12-06 ENCOUNTER — Inpatient Hospital Stay (HOSPITAL_BASED_OUTPATIENT_CLINIC_OR_DEPARTMENT_OTHER): Payer: Medicare Other | Admitting: Hematology and Oncology

## 2018-12-06 ENCOUNTER — Inpatient Hospital Stay: Payer: Medicare Other

## 2018-12-06 ENCOUNTER — Other Ambulatory Visit: Payer: Self-pay

## 2018-12-06 DIAGNOSIS — Z5111 Encounter for antineoplastic chemotherapy: Secondary | ICD-10-CM

## 2018-12-06 DIAGNOSIS — Z79899 Other long term (current) drug therapy: Secondary | ICD-10-CM

## 2018-12-06 DIAGNOSIS — C50511 Malignant neoplasm of lower-outer quadrant of right female breast: Secondary | ICD-10-CM

## 2018-12-06 DIAGNOSIS — Z171 Estrogen receptor negative status [ER-]: Secondary | ICD-10-CM

## 2018-12-06 DIAGNOSIS — R5383 Other fatigue: Secondary | ICD-10-CM

## 2018-12-06 DIAGNOSIS — N183 Chronic kidney disease, stage 3 (moderate): Secondary | ICD-10-CM

## 2018-12-06 DIAGNOSIS — D72829 Elevated white blood cell count, unspecified: Secondary | ICD-10-CM

## 2018-12-06 DIAGNOSIS — R110 Nausea: Secondary | ICD-10-CM | POA: Diagnosis not present

## 2018-12-06 DIAGNOSIS — Z95828 Presence of other vascular implants and grafts: Secondary | ICD-10-CM

## 2018-12-06 LAB — CBC WITH DIFFERENTIAL (CANCER CENTER ONLY)
Abs Immature Granulocytes: 0.04 10*3/uL (ref 0.00–0.07)
Basophils Absolute: 0.1 10*3/uL (ref 0.0–0.1)
Basophils Relative: 1 %
Eosinophils Absolute: 0.1 10*3/uL (ref 0.0–0.5)
Eosinophils Relative: 2 %
HCT: 32.4 % — ABNORMAL LOW (ref 36.0–46.0)
Hemoglobin: 10.4 g/dL — ABNORMAL LOW (ref 12.0–15.0)
Immature Granulocytes: 1 %
Lymphocytes Relative: 54 %
Lymphs Abs: 3.2 10*3/uL (ref 0.7–4.0)
MCH: 26 pg (ref 26.0–34.0)
MCHC: 32.1 g/dL (ref 30.0–36.0)
MCV: 81 fL (ref 80.0–100.0)
Monocytes Absolute: 0.8 10*3/uL (ref 0.1–1.0)
Monocytes Relative: 13 %
Neutro Abs: 1.7 10*3/uL (ref 1.7–7.7)
Neutrophils Relative %: 29 %
Platelet Count: 283 10*3/uL (ref 150–400)
RBC: 4 MIL/uL (ref 3.87–5.11)
RDW: 15.8 % — ABNORMAL HIGH (ref 11.5–15.5)
WBC Count: 5.9 10*3/uL (ref 4.0–10.5)
nRBC: 0 % (ref 0.0–0.2)

## 2018-12-06 LAB — CMP (CANCER CENTER ONLY)
ALT: 13 U/L (ref 0–44)
AST: 17 U/L (ref 15–41)
Albumin: 3.6 g/dL (ref 3.5–5.0)
Alkaline Phosphatase: 88 U/L (ref 38–126)
Anion gap: 9 (ref 5–15)
BUN: 25 mg/dL — ABNORMAL HIGH (ref 8–23)
CO2: 26 mmol/L (ref 22–32)
Calcium: 9.1 mg/dL (ref 8.9–10.3)
Chloride: 103 mmol/L (ref 98–111)
Creatinine: 1.45 mg/dL — ABNORMAL HIGH (ref 0.44–1.00)
GFR, Est AFR Am: 40 mL/min — ABNORMAL LOW (ref >60)
GFR, Estimated: 35 mL/min — ABNORMAL LOW (ref >60)
Glucose, Bld: 99 mg/dL (ref 70–99)
Potassium: 3.9 mmol/L (ref 3.5–5.1)
Sodium: 138 mmol/L (ref 135–145)
Total Bilirubin: 0.2 mg/dL — ABNORMAL LOW (ref 0.3–1.2)
Total Protein: 7.1 g/dL (ref 6.5–8.1)

## 2018-12-06 MED ORDER — DEXAMETHASONE SODIUM PHOSPHATE 10 MG/ML IJ SOLN
10.0000 mg | Freq: Once | INTRAMUSCULAR | Status: AC
  Administered 2018-12-06: 10 mg via INTRAVENOUS

## 2018-12-06 MED ORDER — PALONOSETRON HCL INJECTION 0.25 MG/5ML
INTRAVENOUS | Status: AC
  Filled 2018-12-06: qty 5

## 2018-12-06 MED ORDER — SODIUM CHLORIDE 0.9 % IV SOLN
Freq: Once | INTRAVENOUS | Status: AC
  Administered 2018-12-06: 10:00:00 via INTRAVENOUS
  Filled 2018-12-06: qty 250

## 2018-12-06 MED ORDER — HEPARIN SOD (PORK) LOCK FLUSH 100 UNIT/ML IV SOLN
500.0000 [IU] | Freq: Once | INTRAVENOUS | Status: AC | PRN
  Administered 2018-12-06: 500 [IU]
  Filled 2018-12-06: qty 5

## 2018-12-06 MED ORDER — PALONOSETRON HCL INJECTION 0.25 MG/5ML
0.2500 mg | Freq: Once | INTRAVENOUS | Status: AC
  Administered 2018-12-06: 0.25 mg via INTRAVENOUS

## 2018-12-06 MED ORDER — METHOTREXATE SODIUM (PF) CHEMO INJECTION 250 MG/10ML
65.0000 mg | Freq: Once | INTRAMUSCULAR | Status: AC
  Administered 2018-12-06: 13:00:00 65 mg via INTRAVENOUS
  Filled 2018-12-06: qty 2.6

## 2018-12-06 MED ORDER — FLUOROURACIL CHEMO INJECTION 2.5 GM/50ML
600.0000 mg/m2 | Freq: Once | INTRAVENOUS | Status: AC
  Administered 2018-12-06: 13:00:00 1250 mg via INTRAVENOUS
  Filled 2018-12-06: qty 25

## 2018-12-06 MED ORDER — SODIUM CHLORIDE 0.9% FLUSH
10.0000 mL | INTRAVENOUS | Status: DC | PRN
  Administered 2018-12-06: 13:00:00 10 mL
  Filled 2018-12-06: qty 10

## 2018-12-06 MED ORDER — SODIUM CHLORIDE 0.9 % IV SOLN
600.0000 mg/m2 | Freq: Once | INTRAVENOUS | Status: AC
  Administered 2018-12-06: 12:00:00 1260 mg via INTRAVENOUS
  Filled 2018-12-06: qty 63

## 2018-12-06 MED ORDER — SODIUM CHLORIDE 0.9% FLUSH
10.0000 mL | Freq: Once | INTRAVENOUS | Status: AC
  Administered 2018-12-06: 10 mL
  Filled 2018-12-06: qty 10

## 2018-12-06 MED ORDER — DEXAMETHASONE SODIUM PHOSPHATE 10 MG/ML IJ SOLN
INTRAMUSCULAR | Status: AC
  Filled 2018-12-06: qty 1

## 2018-12-06 NOTE — Patient Instructions (Signed)
[facility] Discharge Instructions for Patients Receiving Chemotherapy  Today you received the following chemotherapy agents:  Cytoxan, Methatrexate, Fluorouracil  To help prevent nausea and vomiting after your treatment, we encourage you to take your nausea medication as prescribed.   If you develop nausea and vomiting that is not controlled by your nausea medication, call the clinic.   BELOW ARE SYMPTOMS THAT SHOULD BE REPORTED IMMEDIATELY:  *FEVER GREATER THAN 100.5 F  *CHILLS WITH OR WITHOUT FEVER  NAUSEA AND VOMITING THAT IS NOT CONTROLLED WITH YOUR NAUSEA MEDICATION  *UNUSUAL SHORTNESS OF BREATH  *UNUSUAL BRUISING OR BLEEDING  TENDERNESS IN MOUTH AND THROAT WITH OR WITHOUT PRESENCE OF ULCERS  *URINARY PROBLEMS  *BOWEL PROBLEMS  UNUSUAL RASH Items with * indicate a potential emergency and should be followed up as soon as possible.  Feel free to call the clinic should you have any questions or concerns. The clinic phone number is [phone].  Please show the [facility] at check-in to the Emergency Department and triage nurse.

## 2018-12-21 NOTE — Assessment & Plan Note (Signed)
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%, T1CN0 stage Ib  Review: Superior marginwas broadly positive: Patient had surgery 10/23/2018 awaiting the results  Treatment plan: 1.Adjuvant chemotherapy with CMF x6 cycles tentative start date 11/15/2018 2.Followed by adjuvant radiation ----------------------------------------------------------------------------------------------------------------------------------------------------- Current treatment: Cycle 3CMF Chemo toxicities: 1.Mild nausea: Patient did not take any antiemetics 2.Worsening fatigue: Over the past week she has gotten more fatigue. She tells me that she has lost appetite for junk food. She has been drinking more water.  Chronic kidney disease: We are to reduce the dosage of methotrexate for low GFR. 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

## 2018-12-23 ENCOUNTER — Other Ambulatory Visit: Payer: Self-pay | Admitting: Hematology and Oncology

## 2018-12-23 DIAGNOSIS — C50511 Malignant neoplasm of lower-outer quadrant of right female breast: Secondary | ICD-10-CM

## 2018-12-26 NOTE — Progress Notes (Signed)
Patient Care Team: Biagio Borg, MD as PCP - General Stanford Breed, Denice Bors, MD as Consulting Physician (Cardiology) Deboraha Sprang, MD as Consulting Physician (Cardiology) Rockwell Germany, RN as Oncology Nurse Navigator Mauro Kaufmann, RN as Oncology Nurse Navigator Nicholas Lose, MD as Consulting Physician (Hematology and Oncology) Stark Klein, MD as Consulting Physician (General Surgery) Eppie Gibson, MD as Attending Physician (Radiation Oncology)  DIAGNOSIS:    ICD-10-CM   1. Malignant neoplasm of lower-outer quadrant of right breast of female, estrogen receptor negative ([place])  C50.511    Z17.1     SUMMARY OF ONCOLOGIC HISTORY: Oncology History  Malignant neoplasm of lower-outer quadrant of right breast of female, estrogen receptor negative ([place])  08/29/2018 Initial Diagnosis   Screening mammogram detected 2 adjacent masses right breast 8:30 position LOQ 8 cm from nipple 1.1 cm and 5 mm, combined mass 1.7 cm, axilla negative, biopsy: Grade 3 IDC triple negative, Ki-67 80%, T1CN0 stage 1B clinical stage   09/05/2018 Cancer Staging   Staging form: Breast, AJCC 8th Edition - Clinical stage from 09/05/2018: Stage IB (cT1c, cN0, cM0, G3, ER-, PR-, HER2-) - Signed by Nicholas Lose, MD on 09/05/2018   10/11/2018 Surgery   Right lumpectomy: Grade 3 IDC, 1.6 cm, 0/3 lymph nodes negative, superior margin broadly positive, triple negative, Ki-67 80%, T1CN0 stage Ib. Re-excision 10/23/18.   10/16/2018 Cancer Staging   Staging form: Breast, AJCC 8th Edition - Pathologic stage from 10/16/2018: Stage IB (pT1c, pN0, cM0, G3, ER-, PR-, HER2-) - Signed by Nicholas Lose, MD on 10/16/2018   11/15/2018 -  Chemotherapy   The patient had palonosetron (ALOXI) injection 0.25 mg, 0.25 mg, Intravenous,  Once, 2 of 6 cycles Administration: 0.25 mg (11/15/2018), 0.25 mg (12/06/2018) methotrexate (PF) chemo injection 65 mg, 84.5 mg, Intravenous,  Once, 2 of 6 cycles Dose modification: 30 mg/m2 (original dose 40  mg/m2, Cycle 2, Reason: Change in SCr/CrCl, Comment: CrCl 53) Administration: 65 mg (11/15/2018), 65 mg (12/06/2018) cyclophosphamide (CYTOXAN) 1,260 mg in sodium chloride 0.9 % 250 mL chemo infusion, 600 mg/m2 = 1,260 mg, Intravenous,  Once, 2 of 6 cycles Administration: 1,260 mg (11/15/2018), 1,260 mg (12/06/2018) fluorouracil (ADRUCIL) chemo injection 1,250 mg, 600 mg/m2 = 1,250 mg, Intravenous,  Once, 2 of 6 cycles Administration: 1,250 mg (11/15/2018), 1,250 mg (12/06/2018)  for chemotherapy treatment.      CHIEF COMPLIANT: Cycle3 Day 1CMF  INTERVAL HISTORY: [name] [name] is a 78 y.o. with above-mentioned history of right breast cancer who underwent a lumpectomyandre-excision.She is currently on adjuvant chemotherapy with CMF.She presents to the clinic today today forcycle 3 day 1.   She had a couple of days of nausea and vomiting after the last cycle  REVIEW OF SYSTEMS:   Constitutional: Denies fevers, chills or abnormal weight loss Eyes: Denies blurriness of vision Ears, nose, mouth, throat, and face: Denies mucositis or sore throat Respiratory: Denies cough, dyspnea or wheezes Cardiovascular: Denies palpitation, chest discomfort Gastrointestinal: Denies nausea, heartburn or change in bowel habits Skin: Denies abnormal skin rashes Lymphatics: Denies new lymphadenopathy or easy bruising Neurological: Denies numbness, tingling or new weaknesses Behavioral/Psych: Mood is stable, no new changes  Extremities: No lower extremity edema Breast: denies any pain or lumps or nodules in either breasts All other systems were reviewed with the patient and are negative.  I have reviewed the past medical history, past surgical history, social history and family history with the patient and they are unchanged from previous note.  ALLERGIES:  is allergic  to topamax [topiramate] and zocor [simvastatin - high dose].  MEDICATIONS:  Current Outpatient Medications  Medication Sig Dispense Refill   . albuterol (PROVENTIL HFA;VENTOLIN HFA) 108 (90 Base) MCG/ACT inhaler Inhale 1 puff into the lungs every 6 (six) hours as needed for wheezing or shortness of breath.    . carvedilol (COREG) 25 MG tablet Take 25 mg by mouth 2 (two) times daily with a meal.     . cholecalciferol (VITAMIN D3) 25 MCG (1000 UT) tablet Take 1,000 Units by mouth daily.    [name] ENTRESTO 24-26 MG TAKE 1 TABLET BY MOUTH 2 (TWO) TIMES DAILY. 180 tablet 3  . furosemide (LASIX) 40 MG tablet Take 40 mg by mouth 2 (two) times daily.    [name] lidocaine-prilocaine (EMLA) cream Apply to affected area once 30 g 3  . LORazepam (ATIVAN) 0.5 MG tablet Take 1 tablet (0.5 mg total) by mouth at bedtime as needed for sleep. 30 tablet 0  . Multiple Vitamin (MULTIVITAMIN WITH MINERALS) TABS tablet Take 1 tablet by mouth daily.    . ondansetron (ZOFRAN) 8 MG tablet Take 1 tablet (8 mg total) by mouth 2 (two) times daily as needed for refractory nausea / vomiting. Start on day 3 after chemotherapy. 30 tablet 1  . oxyCODONE (OXY IR/ROXICODONE) 5 MG immediate release tablet Take 0.5-1 tablets (2.5-5 mg total) by mouth every 6 (six) hours as needed for severe pain. 20 tablet 0  . prochlorperazine (COMPAZINE) 10 MG tablet TAKE 1 TABLET (10 MG TOTAL) BY MOUTH EVERY 6 (SIX) HOURS AS NEEDED (NAUSEA OR VOMITING). 30 tablet 1  . rizatriptan (MAXALT) 10 MG tablet Take 10 mg by mouth as needed for migraine. May repeat in 2 hours if needed    . spironolactone (ALDACTONE) 25 MG tablet Take 0.5 tablets (12.5 mg total) by mouth daily. 45 tablet 3   No current facility-administered medications for this visit.     PHYSICAL EXAMINATION: ECOG PERFORMANCE STATUS: 1 - Symptomatic but completely ambulatory  Vitals:   12/27/18 1104  BP: (!) 121/53  Pulse: 76  Resp: 18  Temp: 98.9 F (37.2 C)  SpO2: 100%   Filed Weights   12/27/18 1104  Weight: 218 lb 6.4 oz (99.1 kg)    GENERAL: alert, no distress and comfortable SKIN: skin color, texture, turgor are  normal, no rashes or significant lesions EYES: normal, Conjunctiva are pink and non-injected, sclera clear OROPHARYNX: no exudate, no erythema and lips, buccal mucosa, and tongue normal  NECK: supple, thyroid normal size, non-tender, without nodularity LYMPH: no palpable lymphadenopathy in the cervical, axillary or inguinal LUNGS: clear to auscultation and percussion with normal breathing effort HEART: regular rate & rhythm and no murmurs and no lower extremity edema ABDOMEN: abdomen soft, non-tender and normal bowel sounds MUSCULOSKELETAL: no cyanosis of digits and no clubbing  NEURO: alert & oriented x 3 with fluent speech, no focal motor/sensory deficits EXTREMITIES: No lower extremity edema  LABORATORY DATA:  I have reviewed the data as listed CMP Latest Ref Rng & Units 12/06/2018 11/22/2018 11/15/2018  Glucose 70 - 99 mg/dL 99 102(H) 92  BUN 8 - 23 mg/dL 25(H) 18 18  Creatinine 0.44 - 1.00 mg/dL 1.45(H) 1.45(H) 1.39(H)  Sodium 135 - 145 mmol/L 138 139 139  Potassium 3.5 - 5.1 mmol/L 3.9 5.2(H) 4.5  Chloride 98 - 111 mmol/L 103 102 104  CO2 22 - 32 mmol/L 26 28 26   Calcium 8.9 - 10.3 mg/dL 9.1 9.0 9.2  Total Protein 6.5 - 8.1  g/dL 7.1 7.2 7.4  Total Bilirubin 0.3 - 1.2 mg/dL 0.2(L) 0.4 0.3  Alkaline Phos 38 - 126 U/L 88 85 83  AST 15 - 41 U/L 17 13(L) 13(L)  ALT 0 - 44 U/L 13 11 9     Lab Results  Component Value Date   WBC 5.9 12/27/2018   HGB 10.0 (L) 12/27/2018   HCT 31.7 (L) 12/27/2018   MCV 81.7 12/27/2018   PLT 285 12/27/2018   NEUTROABS 1.7 12/27/2018    ASSESSMENT & PLAN:  Malignant neoplasm of lower-outer quadrant of right breast of female, estrogen receptor negative ([facility]) 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%, T1CN0 stage Ib  Review: Superior marginwas broadly positive: Patient had surgery 10/23/2018: Benign Peripheral blood flow cytometry: Monoclonal B-cell  population kappa restricted lymphocytes CD20 positive but lacks CD5, CD10, CD23 or CD103: B-cell lymphoproliferative process  Treatment plan: 1.Adjuvant chemotherapy with CMF x6 cycles tentative start date 11/15/2018 2.Followed by adjuvant radiation ----------------------------------------------------------------------------------------------------------------------------------------------------- Current treatment: Cycle 3CMF Chemo toxicities: 1.Mild nausea: Patient did not take any antiemetics 2.Worsening fatigue: Over the past week she has gotten more fatigue. She tells me that she has lost appetite for junk food. She has been drinking more water.  Chronic kidney disease: We are to reduce the dosage of methotrexate for low GFR. 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  The patient has a good understanding of the overall plan. she agrees with it. she will call with any problems that may develop before the next visit here.  Nicholas Lose, MD 12/27/2018  Julious Oka Dorshimer am acting as scribe for Dr. Nicholas Lose.  I have reviewed the above documentation for accuracy and completeness, and I agree with the above.

## 2018-12-27 ENCOUNTER — Other Ambulatory Visit: Payer: Self-pay

## 2018-12-27 ENCOUNTER — Inpatient Hospital Stay: Payer: Medicare Other

## 2018-12-27 ENCOUNTER — Inpatient Hospital Stay: Payer: Medicare Other | Attending: Hematology and Oncology

## 2018-12-27 ENCOUNTER — Encounter: Payer: Self-pay | Admitting: *Deleted

## 2018-12-27 ENCOUNTER — Inpatient Hospital Stay (HOSPITAL_BASED_OUTPATIENT_CLINIC_OR_DEPARTMENT_OTHER): Payer: Medicare Other | Admitting: Hematology and Oncology

## 2018-12-27 DIAGNOSIS — C50511 Malignant neoplasm of lower-outer quadrant of right female breast: Secondary | ICD-10-CM | POA: Insufficient documentation

## 2018-12-27 DIAGNOSIS — Z171 Estrogen receptor negative status [ER-]: Secondary | ICD-10-CM

## 2018-12-27 DIAGNOSIS — Z79899 Other long term (current) drug therapy: Secondary | ICD-10-CM | POA: Insufficient documentation

## 2018-12-27 DIAGNOSIS — D72820 Lymphocytosis (symptomatic): Secondary | ICD-10-CM | POA: Insufficient documentation

## 2018-12-27 DIAGNOSIS — Z5111 Encounter for antineoplastic chemotherapy: Secondary | ICD-10-CM | POA: Diagnosis not present

## 2018-12-27 DIAGNOSIS — R110 Nausea: Secondary | ICD-10-CM

## 2018-12-27 DIAGNOSIS — R5383 Other fatigue: Secondary | ICD-10-CM

## 2018-12-27 DIAGNOSIS — Z95828 Presence of other vascular implants and grafts: Secondary | ICD-10-CM

## 2018-12-27 LAB — CBC WITH DIFFERENTIAL (CANCER CENTER ONLY)
Abs Immature Granulocytes: 0.05 10*3/uL (ref 0.00–0.07)
Basophils Absolute: 0.1 10*3/uL (ref 0.0–0.1)
Basophils Relative: 1 %
Eosinophils Absolute: 0.2 10*3/uL (ref 0.0–0.5)
Eosinophils Relative: 3 %
HCT: 31.7 % — ABNORMAL LOW (ref 36.0–46.0)
Hemoglobin: 10 g/dL — ABNORMAL LOW (ref 12.0–15.0)
Immature Granulocytes: 1 %
Lymphocytes Relative: 52 %
Lymphs Abs: 3.1 10*3/uL (ref 0.7–4.0)
MCH: 25.8 pg — ABNORMAL LOW (ref 26.0–34.0)
MCHC: 31.5 g/dL (ref 30.0–36.0)
MCV: 81.7 fL (ref 80.0–100.0)
Monocytes Absolute: 0.9 10*3/uL (ref 0.1–1.0)
Monocytes Relative: 15 %
Neutro Abs: 1.7 10*3/uL (ref 1.7–7.7)
Neutrophils Relative %: 28 %
Platelet Count: 285 10*3/uL (ref 150–400)
RBC: 3.88 MIL/uL (ref 3.87–5.11)
RDW: 17.2 % — ABNORMAL HIGH (ref 11.5–15.5)
WBC Count: 5.9 10*3/uL (ref 4.0–10.5)
nRBC: 0 % (ref 0.0–0.2)

## 2018-12-27 LAB — CMP (CANCER CENTER ONLY)
ALT: 10 U/L (ref 0–44)
AST: 14 U/L — ABNORMAL LOW (ref 15–41)
Albumin: 3.7 g/dL (ref 3.5–5.0)
Alkaline Phosphatase: 93 U/L (ref 38–126)
Anion gap: 9 (ref 5–15)
BUN: 17 mg/dL (ref 8–23)
CO2: 28 mmol/L (ref 22–32)
Calcium: 9.2 mg/dL (ref 8.9–10.3)
Chloride: 104 mmol/L (ref 98–111)
Creatinine: 1.38 mg/dL — ABNORMAL HIGH (ref 0.44–1.00)
GFR, Est AFR Am: 43 mL/min — ABNORMAL LOW (ref >60)
GFR, Estimated: 37 mL/min — ABNORMAL LOW (ref >60)
Glucose, Bld: 92 mg/dL (ref 70–99)
Potassium: 4.2 mmol/L (ref 3.5–5.1)
Sodium: 141 mmol/L (ref 135–145)
Total Bilirubin: 0.2 mg/dL — ABNORMAL LOW (ref 0.3–1.2)
Total Protein: 7.2 g/dL (ref 6.5–8.1)

## 2018-12-27 MED ORDER — DEXAMETHASONE SODIUM PHOSPHATE 10 MG/ML IJ SOLN
10.0000 mg | Freq: Once | INTRAMUSCULAR | Status: AC
  Administered 2018-12-27: 10 mg via INTRAVENOUS

## 2018-12-27 MED ORDER — SODIUM CHLORIDE 0.9 % IV SOLN
Freq: Once | INTRAVENOUS | Status: AC
  Administered 2018-12-27: 12:00:00 via INTRAVENOUS
  Filled 2018-12-27: qty 250

## 2018-12-27 MED ORDER — PALONOSETRON HCL INJECTION 0.25 MG/5ML
INTRAVENOUS | Status: AC
  Filled 2018-12-27: qty 5

## 2018-12-27 MED ORDER — METHOTREXATE SODIUM (PF) CHEMO INJECTION 250 MG/10ML
65.0000 mg | Freq: Once | INTRAMUSCULAR | Status: AC
  Administered 2018-12-27: 65 mg via INTRAVENOUS
  Filled 2018-12-27: qty 2.6

## 2018-12-27 MED ORDER — SODIUM CHLORIDE 0.9 % IV SOLN
600.0000 mg/m2 | Freq: Once | INTRAVENOUS | Status: AC
  Administered 2018-12-27: 1260 mg via INTRAVENOUS
  Filled 2018-12-27: qty 63

## 2018-12-27 MED ORDER — SODIUM CHLORIDE 0.9% FLUSH
10.0000 mL | INTRAVENOUS | Status: DC | PRN
  Administered 2018-12-27: 10 mL
  Filled 2018-12-27: qty 10

## 2018-12-27 MED ORDER — DEXAMETHASONE SODIUM PHOSPHATE 10 MG/ML IJ SOLN
INTRAMUSCULAR | Status: AC
  Filled 2018-12-27: qty 1

## 2018-12-27 MED ORDER — FLUOROURACIL CHEMO INJECTION 2.5 GM/50ML
600.0000 mg/m2 | Freq: Once | INTRAVENOUS | Status: AC
  Administered 2018-12-27: 14:00:00 1250 mg via INTRAVENOUS
  Filled 2018-12-27: qty 25

## 2018-12-27 MED ORDER — HEPARIN SOD (PORK) LOCK FLUSH 100 UNIT/ML IV SOLN
500.0000 [IU] | Freq: Once | INTRAVENOUS | Status: AC | PRN
  Administered 2018-12-27: 14:00:00 500 [IU]
  Filled 2018-12-27: qty 5

## 2018-12-27 MED ORDER — SODIUM CHLORIDE 0.9% FLUSH
10.0000 mL | Freq: Once | INTRAVENOUS | Status: AC
  Administered 2018-12-27: 11:00:00 10 mL
  Filled 2018-12-27: qty 10

## 2018-12-27 MED ORDER — PALONOSETRON HCL INJECTION 0.25 MG/5ML
0.2500 mg | Freq: Once | INTRAVENOUS | Status: AC
  Administered 2018-12-27: 12:00:00 0.25 mg via INTRAVENOUS

## 2018-12-27 NOTE — Patient Instructions (Signed)
[facility] Discharge Instructions for Patients Receiving Chemotherapy  Today you received the following chemotherapy agents: Cytoxan, methotrexate, 5FU  To help prevent nausea and vomiting after your treatment, we encourage you to take your nausea medication as directed.    If you develop nausea and vomiting that is not controlled by your nausea medication, call the clinic.   BELOW ARE SYMPTOMS THAT SHOULD BE REPORTED IMMEDIATELY:  *FEVER GREATER THAN 100.5 F  *CHILLS WITH OR WITHOUT FEVER  NAUSEA AND VOMITING THAT IS NOT CONTROLLED WITH YOUR NAUSEA MEDICATION  *UNUSUAL SHORTNESS OF BREATH  *UNUSUAL BRUISING OR BLEEDING  TENDERNESS IN MOUTH AND THROAT WITH OR WITHOUT PRESENCE OF ULCERS  *URINARY PROBLEMS  *BOWEL PROBLEMS  UNUSUAL RASH Items with * indicate a potential emergency and should be followed up as soon as possible.  Feel free to call the clinic should you have any questions or concerns. The clinic phone number is [phone].  Please show the [place] at check-in to the Emergency Department and triage nurse.   Coronavirus (COVID-19) Are you at risk?  Are you at risk for the Coronavirus (COVID-19)?  To be considered HIGH RISK for Coronavirus (COVID-19), you have to meet the following criteria:  . Traveled to [place], [place], [place], [place] or [place]; or in the [place] to [place], [place], [place], or [place]; and have fever, cough, and shortness of breath within the last 2 weeks of travel OR . Been in close contact with a person diagnosed with COVID-19 within the last 2 weeks and have fever, cough, and shortness of breath . IF YOU DO NOT MEET THESE CRITERIA, YOU ARE CONSIDERED LOW RISK FOR COVID-19.  What to do if you are HIGH RISK for COVID-19?  [name] If you are having a medical emergency, call 911. . Seek medical care right away. Before you go to a doctor's office, urgent care or emergency department, call ahead and tell  them about your recent travel, contact with someone diagnosed with COVID-19, and your symptoms. You should receive instructions from your physician's office regarding next steps of care.  . When you arrive at healthcare provider, tell the healthcare staff immediately you have returned from visiting [place], [place], [place], [place] or [place]; or traveled in the [place] to [place], [place], [place], or [place]; in the last two weeks or you have been in close contact with a person diagnosed with COVID-19 in the last 2 weeks.   . Tell the health care staff about your symptoms: fever, cough and shortness of breath. . After you have been seen by a medical provider, you will be either: o Tested for (COVID-19) and discharged home on quarantine except to seek medical care if symptoms worsen, and asked to  - Stay home and avoid contact with others until you get your results (4-5 days)  - Avoid travel on public transportation if possible (such as bus, train, or airplane) or o Sent to the Emergency Department by EMS for evaluation, COVID-19 testing, and possible admission depending on your condition and test results.  What to do if you are LOW RISK for COVID-19?  Reduce your risk of any infection by using the same precautions used for avoiding the common cold or flu:  [name] Wash your hands often with soap and warm water for at least 20 seconds.  If soap and water are not readily available, use an alcohol-based hand sanitizer with at least 60% alcohol.  [name]  If coughing or sneezing, cover your mouth and nose by coughing or sneezing into the elbow areas of your shirt or coat, into a tissue or into your sleeve (not your hands). . Avoid shaking hands with others and consider head nods or verbal greetings only. . Avoid touching your eyes, nose, or mouth with unwashed hands.  . Avoid close contact with people who are sick. . Avoid places or events with large numbers of people in one location, like concerts or  sporting events. . Carefully consider travel plans you have or are making. . If you are planning any travel outside or inside the [place], visit the CDC's Travelers' Health webpage for the latest health notices. . If you have some symptoms but not all symptoms, continue to monitor at home and seek medical attention if your symptoms worsen. . If you are having a medical emergency, call 911.   [place] / e-Visit: [email or web address]         [facility]: [facility]: [phone]                   [facility]: [phone]

## 2018-12-31 ENCOUNTER — Telehealth: Payer: Self-pay | Admitting: *Deleted

## 2018-12-31 NOTE — Telephone Encounter (Signed)
Received call from pt stating she has been experiencing head to toe body aches since Saturday evening.  Pt states aches are relieved by 500 mg p.o Tylenol but is only taking it once a day.  Pt denies fever or any symptoms of infection.  Pt educated to increase fluid intake and take tylenol twice a day, once in the morning and once at night to see if this improves her symptoms.  Pt verbalized understanding and stated she would call if she experiences any worsening symptoms.

## 2019-01-10 NOTE — Assessment & Plan Note (Signed)
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%, T1CN0 stage Ib  Review: Superior marginwas broadly positive: Patient had surgery 10/23/2018: Benign Peripheral blood flow cytometry: Monoclonal B-cell population kappa restricted lymphocytes CD20 positive but lacks CD5, CD10, CD23 or CD103: B-cell lymphoproliferative process  Treatment plan: 1.Adjuvant chemotherapy with CMF x6 cycles tentative start date 11/15/2018 2.Followed by adjuvant radiation ----------------------------------------------------------------------------------------------------------------------------------------------------- Current treatment: Cycle4CMF Chemo toxicities: 1.Mild nausea: Patient did not take any antiemetics 2.Worseningfatigue: Over the past week she has gotten more fatigue. She tells me that she has lost appetite for junk food. She has been drinking more water.  Chronic kidney disease: We are to reduce the dosage of methotrexate for low GFR. 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

## 2019-01-16 NOTE — Progress Notes (Signed)
Patient Care Team: Biagio Borg, MD as PCP - General Stanford Breed, Denice Bors, MD as Consulting Physician (Cardiology) Deboraha Sprang, MD as Consulting Physician (Cardiology) Rockwell Germany, RN as Oncology Nurse Navigator Mauro Kaufmann, RN as Oncology Nurse Navigator Nicholas Lose, MD as Consulting Physician (Hematology and Oncology) Stark Klein, MD as Consulting Physician (General Surgery) Eppie Gibson, MD as Attending Physician (Radiation Oncology)  DIAGNOSIS:    ICD-10-CM   1. Malignant neoplasm of lower-outer quadrant of right breast of female, estrogen receptor negative ([facility])  C50.511    Z17.1     SUMMARY OF ONCOLOGIC HISTORY: Oncology History  Malignant neoplasm of lower-outer quadrant of right breast of female, estrogen receptor negative ([facility])  08/29/2018 Initial Diagnosis   Screening mammogram detected 2 adjacent masses right breast 8:30 position LOQ 8 cm from nipple 1.1 cm and 5 mm, combined mass 1.7 cm, axilla negative, biopsy: Grade 3 IDC triple negative, Ki-67 80%, T1CN0 stage 1B clinical stage   09/05/2018 Cancer Staging   Staging form: Breast, AJCC 8th Edition - Clinical stage from 09/05/2018: Stage IB (cT1c, cN0, cM0, G3, ER-, PR-, HER2-) - Signed by Nicholas Lose, MD on 09/05/2018   10/11/2018 Surgery   Right lumpectomy: Grade 3 IDC, 1.6 cm, 0/3 lymph nodes negative, superior margin broadly positive, triple negative, Ki-67 80%, T1CN0 stage Ib. Re-excision 10/23/18.   10/16/2018 Cancer Staging   Staging form: Breast, AJCC 8th Edition - Pathologic stage from 10/16/2018: Stage IB (pT1c, pN0, cM0, G3, ER-, PR-, HER2-) - Signed by Nicholas Lose, MD on 10/16/2018   11/15/2018 -  Chemotherapy   The patient had palonosetron (ALOXI) injection 0.25 mg, 0.25 mg, Intravenous,  Once, 3 of 6 cycles Administration: 0.25 mg (11/15/2018), 0.25 mg (12/06/2018), 0.25 mg (12/27/2018) methotrexate (PF) chemo injection 65 mg, 84.5 mg, Intravenous,  Once, 3 of 6 cycles Dose modification: 30 mg/m2  (original dose 40 mg/m2, Cycle 2, Reason: Change in SCr/CrCl, Comment: CrCl 53) Administration: 65 mg (11/15/2018), 65 mg (12/06/2018), 65 mg (12/27/2018) cyclophosphamide (CYTOXAN) 1,260 mg in sodium chloride 0.9 % 250 mL chemo infusion, 600 mg/m2 = 1,260 mg, Intravenous,  Once, 3 of 6 cycles Administration: 1,260 mg (11/15/2018), 1,260 mg (12/06/2018), 1,260 mg (12/27/2018) fluorouracil (ADRUCIL) chemo injection 1,250 mg, 600 mg/m2 = 1,250 mg, Intravenous,  Once, 3 of 6 cycles Administration: 1,250 mg (11/15/2018), 1,250 mg (12/06/2018), 1,250 mg (12/27/2018)  for chemotherapy treatment.      CHIEF COMPLIANT: Cycle4 Day 1CMF  INTERVAL HISTORY: [name] [name] is a 78 y.o. with above-mentioned history of right breast cancer who underwent a lumpectomyandre-excision.She is currently on adjuvant chemotherapy with CMF.She presents to the clinic today today forcycle4 day 1.  REVIEW OF SYSTEMS:   Constitutional: Denies fevers, chills or abnormal weight loss Eyes: Denies blurriness of vision Ears, nose, mouth, throat, and face: Denies mucositis or sore throat Respiratory: Denies cough, dyspnea or wheezes Cardiovascular: Denies palpitation, chest discomfort Gastrointestinal: Denies nausea, heartburn or change in bowel habits Skin: Denies abnormal skin rashes Lymphatics: Denies new lymphadenopathy or easy bruising Neurological: Denies numbness, tingling or new weaknesses Behavioral/Psych: Mood is stable, no new changes  Extremities: No lower extremity edema Breast: denies any pain or lumps or nodules in either breasts All other systems were reviewed with the patient and are negative.  I have reviewed the past medical history, past surgical history, social history and family history with the patient and they are unchanged from previous note.  ALLERGIES:  is allergic to topamax [topiramate] and zocor [  simvastatin - high dose].  MEDICATIONS:  Current Outpatient Medications  Medication Sig  Dispense Refill  . albuterol (PROVENTIL HFA;VENTOLIN HFA) 108 (90 Base) MCG/ACT inhaler Inhale 1 puff into the lungs every 6 (six) hours as needed for wheezing or shortness of breath.    . carvedilol (COREG) 25 MG tablet Take 25 mg by mouth 2 (two) times daily with a meal.     . cholecalciferol (VITAMIN D3) 25 MCG (1000 UT) tablet Take 1,000 Units by mouth daily.    [name] ENTRESTO 24-26 MG TAKE 1 TABLET BY MOUTH 2 (TWO) TIMES DAILY. 180 tablet 3  . furosemide (LASIX) 40 MG tablet Take 40 mg by mouth 2 (two) times daily.    [name] lidocaine-prilocaine (EMLA) cream Apply to affected area once 30 g 3  . LORazepam (ATIVAN) 0.5 MG tablet Take 1 tablet (0.5 mg total) by mouth at bedtime as needed for sleep. 30 tablet 0  . Multiple Vitamin (MULTIVITAMIN WITH MINERALS) TABS tablet Take 1 tablet by mouth daily.    . ondansetron (ZOFRAN) 8 MG tablet Take 1 tablet (8 mg total) by mouth 2 (two) times daily as needed for refractory nausea / vomiting. Start on day 3 after chemotherapy. 30 tablet 1  . oxyCODONE (OXY IR/ROXICODONE) 5 MG immediate release tablet Take 0.5-1 tablets (2.5-5 mg total) by mouth every 6 (six) hours as needed for severe pain. 20 tablet 0  . prochlorperazine (COMPAZINE) 10 MG tablet TAKE 1 TABLET (10 MG TOTAL) BY MOUTH EVERY 6 (SIX) HOURS AS NEEDED (NAUSEA OR VOMITING). 30 tablet 1  . rizatriptan (MAXALT) 10 MG tablet Take 10 mg by mouth as needed for migraine. May repeat in 2 hours if needed    . spironolactone (ALDACTONE) 25 MG tablet Take 0.5 tablets (12.5 mg total) by mouth daily. 45 tablet 3   No current facility-administered medications for this visit.     PHYSICAL EXAMINATION: ECOG PERFORMANCE STATUS: 1 - Symptomatic but completely ambulatory  Vitals:   01/17/19 0927  BP: (!) 127/58  Pulse: 82  Resp: 18  Temp: 98 F (36.7 C)  SpO2: 100%   Filed Weights   01/17/19 0927  Weight: 221 lb 6 oz (100.4 kg)    GENERAL: alert, no distress and comfortable SKIN: skin color, texture,  turgor are normal, no rashes or significant lesions EYES: normal, Conjunctiva are pink and non-injected, sclera clear OROPHARYNX: no exudate, no erythema and lips, buccal mucosa, and tongue normal  NECK: supple, thyroid normal size, non-tender, without nodularity LYMPH: no palpable lymphadenopathy in the cervical, axillary or inguinal LUNGS: clear to auscultation and percussion with normal breathing effort HEART: regular rate & rhythm and no murmurs and no lower extremity edema ABDOMEN: abdomen soft, non-tender and normal bowel sounds MUSCULOSKELETAL: no cyanosis of digits and no clubbing  NEURO: alert & oriented x 3 with fluent speech, no focal motor/sensory deficits EXTREMITIES: No lower extremity edema  LABORATORY DATA:  I have reviewed the data as listed CMP Latest Ref Rng & Units 12/27/2018 12/06/2018 11/22/2018  Glucose 70 - 99 mg/dL 92 99 102(H)  BUN 8 - 23 mg/dL 17 25(H) 18  Creatinine 0.44 - 1.00 mg/dL 1.38(H) 1.45(H) 1.45(H)  Sodium 135 - 145 mmol/L 141 138 139  Potassium 3.5 - 5.1 mmol/L 4.2 3.9 5.2(H)  Chloride 98 - 111 mmol/L 104 103 102  CO2 22 - 32 mmol/L 28 26 28   Calcium 8.9 - 10.3 mg/dL 9.2 9.1 9.0  Total Protein 6.5 - 8.1 g/dL 7.2 7.1 7.2  Total Bilirubin 0.3 - 1.2 mg/dL 0.2(L) 0.2(L) 0.4  Alkaline Phos 38 - 126 U/L 93 88 85  AST 15 - 41 U/L 14(L) 17 13(L)  ALT 0 - 44 U/L 10 13 11     Lab Results  Component Value Date   WBC 6.2 01/17/2019   HGB 9.9 (L) 01/17/2019   HCT 30.7 (L) 01/17/2019   MCV 81.9 01/17/2019   PLT 293 01/17/2019   NEUTROABS 2.0 01/17/2019    ASSESSMENT & PLAN:  Malignant neoplasm of lower-outer quadrant of right breast of female, estrogen receptor negative ([facility]) 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%, T1CN0 stage Ib  Review: Superior marginwas broadly positive: Patient had surgery 10/23/2018: Benign Peripheral blood flow cytometry: Monoclonal  B-cell population kappa restricted lymphocytes CD20 positive but lacks CD5, CD10, CD23 or CD103: B-cell lymphoproliferative process  Treatment plan: 1.Adjuvant chemotherapy with CMF x6 cycles tentative start date 11/15/2018 2.Followed by adjuvant radiation ----------------------------------------------------------------------------------------------------------------------------------------------------- Current treatment: Cycle4CMF Chemo toxicities: 1.Mild nausea: Patient did not take any antiemetics 2.Worseningfatigue: Fatigue is continued but she is still trying to exercise a few minutes every day. 3.  Occasional dizziness She tells me that she has lost appetite for junk food. She has been drinking more water.  Chronic kidney disease: We  reduced the dosage of methotrexate for low GFR.  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 Return to clinic in3weeksfor cycle5    No orders of the defined types were placed in this encounter.  The patient has a good understanding of the overall plan. she agrees with it. she will call with any problems that may develop before the next visit here.  Nicholas Lose, MD 01/17/2019  Julious Oka Dorshimer am acting as scribe for Dr. Nicholas Lose.  I have reviewed the above documentation for accuracy and completeness, and I agree with the above.

## 2019-01-17 ENCOUNTER — Inpatient Hospital Stay (HOSPITAL_BASED_OUTPATIENT_CLINIC_OR_DEPARTMENT_OTHER): Payer: Medicare Other | Admitting: Hematology and Oncology

## 2019-01-17 ENCOUNTER — Inpatient Hospital Stay: Payer: Medicare Other

## 2019-01-17 ENCOUNTER — Other Ambulatory Visit: Payer: Self-pay

## 2019-01-17 ENCOUNTER — Inpatient Hospital Stay: Payer: Medicare Other | Attending: Hematology and Oncology

## 2019-01-17 ENCOUNTER — Telehealth: Payer: Self-pay | Admitting: Internal Medicine

## 2019-01-17 ENCOUNTER — Encounter: Payer: Self-pay | Admitting: *Deleted

## 2019-01-17 DIAGNOSIS — N189 Chronic kidney disease, unspecified: Secondary | ICD-10-CM

## 2019-01-17 DIAGNOSIS — Z171 Estrogen receptor negative status [ER-]: Secondary | ICD-10-CM | POA: Diagnosis not present

## 2019-01-17 DIAGNOSIS — I5022 Chronic systolic (congestive) heart failure: Secondary | ICD-10-CM

## 2019-01-17 DIAGNOSIS — R5383 Other fatigue: Secondary | ICD-10-CM | POA: Insufficient documentation

## 2019-01-17 DIAGNOSIS — Z79899 Other long term (current) drug therapy: Secondary | ICD-10-CM

## 2019-01-17 DIAGNOSIS — Z5111 Encounter for antineoplastic chemotherapy: Secondary | ICD-10-CM | POA: Diagnosis not present

## 2019-01-17 DIAGNOSIS — R42 Dizziness and giddiness: Secondary | ICD-10-CM | POA: Insufficient documentation

## 2019-01-17 DIAGNOSIS — R110 Nausea: Secondary | ICD-10-CM

## 2019-01-17 DIAGNOSIS — Z95828 Presence of other vascular implants and grafts: Secondary | ICD-10-CM

## 2019-01-17 DIAGNOSIS — C50511 Malignant neoplasm of lower-outer quadrant of right female breast: Secondary | ICD-10-CM

## 2019-01-17 DIAGNOSIS — Z9221 Personal history of antineoplastic chemotherapy: Secondary | ICD-10-CM

## 2019-01-17 LAB — CMP (CANCER CENTER ONLY)
ALT: 11 U/L (ref 0–44)
AST: 16 U/L (ref 15–41)
Albumin: 3.7 g/dL (ref 3.5–5.0)
Alkaline Phosphatase: 91 U/L (ref 38–126)
Anion gap: 12 (ref 5–15)
BUN: 20 mg/dL (ref 8–23)
CO2: 25 mmol/L (ref 22–32)
Calcium: 9 mg/dL (ref 8.9–10.3)
Chloride: 103 mmol/L (ref 98–111)
Creatinine: 1.37 mg/dL — ABNORMAL HIGH (ref 0.44–1.00)
GFR, Est AFR Am: 43 mL/min — ABNORMAL LOW (ref >60)
GFR, Estimated: 37 mL/min — ABNORMAL LOW (ref >60)
Glucose, Bld: 93 mg/dL (ref 70–99)
Potassium: 4.3 mmol/L (ref 3.5–5.1)
Sodium: 140 mmol/L (ref 135–145)
Total Bilirubin: 0.2 mg/dL — ABNORMAL LOW (ref 0.3–1.2)
Total Protein: 7.1 g/dL (ref 6.5–8.1)

## 2019-01-17 LAB — CBC WITH DIFFERENTIAL (CANCER CENTER ONLY)
Abs Immature Granulocytes: 0.08 10*3/uL — ABNORMAL HIGH (ref 0.00–0.07)
Basophils Absolute: 0.1 10*3/uL (ref 0.0–0.1)
Basophils Relative: 1 %
Eosinophils Absolute: 0.2 10*3/uL (ref 0.0–0.5)
Eosinophils Relative: 4 %
HCT: 30.7 % — ABNORMAL LOW (ref 36.0–46.0)
Hemoglobin: 9.9 g/dL — ABNORMAL LOW (ref 12.0–15.0)
Immature Granulocytes: 1 %
Lymphocytes Relative: 43 %
Lymphs Abs: 2.7 10*3/uL (ref 0.7–4.0)
MCH: 26.4 pg (ref 26.0–34.0)
MCHC: 32.2 g/dL (ref 30.0–36.0)
MCV: 81.9 fL (ref 80.0–100.0)
Monocytes Absolute: 1.1 10*3/uL — ABNORMAL HIGH (ref 0.1–1.0)
Monocytes Relative: 18 %
Neutro Abs: 2 10*3/uL (ref 1.7–7.7)
Neutrophils Relative %: 33 %
Platelet Count: 293 10*3/uL (ref 150–400)
RBC: 3.75 MIL/uL — ABNORMAL LOW (ref 3.87–5.11)
RDW: 17.8 % — ABNORMAL HIGH (ref 11.5–15.5)
WBC Count: 6.2 10*3/uL (ref 4.0–10.5)
nRBC: 0 % (ref 0.0–0.2)

## 2019-01-17 MED ORDER — SODIUM CHLORIDE 0.9% FLUSH
10.0000 mL | Freq: Once | INTRAVENOUS | Status: AC
  Administered 2019-01-17: 10 mL
  Filled 2019-01-17: qty 10

## 2019-01-17 MED ORDER — DEXAMETHASONE SODIUM PHOSPHATE 10 MG/ML IJ SOLN
10.0000 mg | Freq: Once | INTRAMUSCULAR | Status: AC
  Administered 2019-01-17: 10 mg via INTRAVENOUS

## 2019-01-17 MED ORDER — SODIUM CHLORIDE 0.9 % IV SOLN
600.0000 mg/m2 | Freq: Once | INTRAVENOUS | Status: AC
  Administered 2019-01-17: 1260 mg via INTRAVENOUS
  Filled 2019-01-17: qty 63

## 2019-01-17 MED ORDER — SPIRONOLACTONE 25 MG PO TABS: 12.5000 mg | ORAL_TABLET | Freq: Every day | ORAL | 3 refills | Status: DC

## 2019-01-17 MED ORDER — HEPARIN SOD (PORK) LOCK FLUSH 100 UNIT/ML IV SOLN
500.0000 [IU] | Freq: Once | INTRAVENOUS | Status: AC | PRN
  Administered 2019-01-17: 500 [IU]
  Filled 2019-01-17: qty 5

## 2019-01-17 MED ORDER — FLUOROURACIL CHEMO INJECTION 2.5 GM/50ML
600.0000 mg/m2 | Freq: Once | INTRAVENOUS | Status: AC
  Administered 2019-01-17: 1250 mg via INTRAVENOUS
  Filled 2019-01-17: qty 25

## 2019-01-17 MED ORDER — PALONOSETRON HCL INJECTION 0.25 MG/5ML
INTRAVENOUS | Status: AC
  Filled 2019-01-17: qty 5

## 2019-01-17 MED ORDER — SODIUM CHLORIDE 0.9 % IV SOLN
Freq: Once | INTRAVENOUS | Status: AC
  Administered 2019-01-17: 10:00:00 via INTRAVENOUS
  Filled 2019-01-17: qty 250

## 2019-01-17 MED ORDER — PALONOSETRON HCL INJECTION 0.25 MG/5ML
0.2500 mg | Freq: Once | INTRAVENOUS | Status: AC
  Administered 2019-01-17: 0.25 mg via INTRAVENOUS

## 2019-01-17 MED ORDER — DEXAMETHASONE SODIUM PHOSPHATE 10 MG/ML IJ SOLN
INTRAMUSCULAR | Status: AC
  Filled 2019-01-17: qty 1

## 2019-01-17 MED ORDER — METHOTREXATE SODIUM (PF) CHEMO INJECTION 250 MG/10ML
30.0000 mg/m2 | Freq: Once | INTRAMUSCULAR | Status: AC
  Administered 2019-01-17: 63.25 mg via INTRAVENOUS
  Filled 2019-01-17: qty 2.53

## 2019-01-17 MED ORDER — SODIUM CHLORIDE 0.9% FLUSH
10.0000 mL | INTRAVENOUS | Status: DC | PRN
  Administered 2019-01-17: 10 mL
  Filled 2019-01-17: qty 10

## 2019-01-17 NOTE — Telephone Encounter (Signed)
Medication Refill - Medication: spironolactone (ALDACTONE) 25 MG tablet   Has the patient contacted their pharmacy? Yes.  Pt called stating she has been trying to get this refilled for a month. Pt states her pharmacy has reached out to office with no response. Please advise.  (Agent: If no, request that the patient contact the pharmacy for the refill.) (Agent: If yes, when and what did the pharmacy advise?)  Preferred Pharmacy (with phone number or street name):  [place] [place] [facility]  [address] [place] [address]  Phone: [phone] Fax: [phone]  Not a 24 hour pharmacy; exact hours not known.     Agent: Please be advised that RX refills may take up to 3 business days. We ask that you follow-up with your pharmacy.

## 2019-01-17 NOTE — Patient Instructions (Signed)
[facility] Discharge Instructions for Patients Receiving Chemotherapy  Today you received the following chemotherapy agents: Cytoxan, methotrexate, 5FU  To help prevent nausea and vomiting after your treatment, we encourage you to take your nausea medication as directed.    If you develop nausea and vomiting that is not controlled by your nausea medication, call the clinic.   BELOW ARE SYMPTOMS THAT SHOULD BE REPORTED IMMEDIATELY:  *FEVER GREATER THAN 100.5 F  *CHILLS WITH OR WITHOUT FEVER  NAUSEA AND VOMITING THAT IS NOT CONTROLLED WITH YOUR NAUSEA MEDICATION  *UNUSUAL SHORTNESS OF BREATH  *UNUSUAL BRUISING OR BLEEDING  TENDERNESS IN MOUTH AND THROAT WITH OR WITHOUT PRESENCE OF ULCERS  *URINARY PROBLEMS  *BOWEL PROBLEMS  UNUSUAL RASH Items with * indicate a potential emergency and should be followed up as soon as possible.  Feel free to call the clinic should you have any questions or concerns. The clinic phone number is [phone].  Please show the [place] at check-in to the Emergency Department and triage nurse.   Coronavirus (COVID-19) Are you at risk?  Are you at risk for the Coronavirus (COVID-19)?  To be considered HIGH RISK for Coronavirus (COVID-19), you have to meet the following criteria:  . Traveled to [place], [place], [place], [place] or [place]; or in the [place] to [place], [place], [place], or [place]; and have fever, cough, and shortness of breath within the last 2 weeks of travel OR . Been in close contact with a person diagnosed with COVID-19 within the last 2 weeks and have fever, cough, and shortness of breath . IF YOU DO NOT MEET THESE CRITERIA, YOU ARE CONSIDERED LOW RISK FOR COVID-19.  What to do if you are HIGH RISK for COVID-19?  [name] If you are having a medical emergency, call 911. . Seek medical care right away. Before you go to a doctor's office, urgent care or emergency department, call ahead and tell  them about your recent travel, contact with someone diagnosed with COVID-19, and your symptoms. You should receive instructions from your physician's office regarding next steps of care.  . When you arrive at healthcare provider, tell the healthcare staff immediately you have returned from visiting [place], [place], [place], [place] or [place]; or traveled in the [place] to [place], [place], [place], or [place]; in the last two weeks or you have been in close contact with a person diagnosed with COVID-19 in the last 2 weeks.   . Tell the health care staff about your symptoms: fever, cough and shortness of breath. . After you have been seen by a medical provider, you will be either: o Tested for (COVID-19) and discharged home on quarantine except to seek medical care if symptoms worsen, and asked to  - Stay home and avoid contact with others until you get your results (4-5 days)  - Avoid travel on public transportation if possible (such as bus, train, or airplane) or o Sent to the Emergency Department by EMS for evaluation, COVID-19 testing, and possible admission depending on your condition and test results.  What to do if you are LOW RISK for COVID-19?  Reduce your risk of any infection by using the same precautions used for avoiding the common cold or flu:  [name] Wash your hands often with soap and warm water for at least 20 seconds.  If soap and water are not readily available, use an alcohol-based hand sanitizer with at least 60% alcohol.  [name]  If coughing or sneezing, cover your mouth and nose by coughing or sneezing into the elbow areas of your shirt or coat, into a tissue or into your sleeve (not your hands). . Avoid shaking hands with others and consider head nods or verbal greetings only. . Avoid touching your eyes, nose, or mouth with unwashed hands.  . Avoid close contact with people who are sick. . Avoid places or events with large numbers of people in one location, like concerts or  sporting events. . Carefully consider travel plans you have or are making. . If you are planning any travel outside or inside the [place], visit the CDC's Travelers' Health webpage for the latest health notices. . If you have some symptoms but not all symptoms, continue to monitor at home and seek medical attention if your symptoms worsen. . If you are having a medical emergency, call 911.   [place] / e-Visit: [email or web address]         [facility]: [facility]: [phone]                   [facility]: [phone]

## 2019-01-18 ENCOUNTER — Telehealth: Payer: Self-pay | Admitting: Internal Medicine

## 2019-01-18 MED ORDER — CARVEDILOL 25 MG PO TABS: 25.0000 mg | ORAL_TABLET | Freq: Two times a day (BID) | ORAL | 5 refills | Status: DC

## 2019-01-18 NOTE — Telephone Encounter (Signed)
Medication: carvedilol (COREG) 25 MG tablet    Patient is requesting a refill of this medication.    Pharmacy:  [id number] - [place], [place] [address] [phone] (Phone) [phone] (Fax)

## 2019-01-27 ENCOUNTER — Other Ambulatory Visit: Payer: Self-pay | Admitting: Hematology and Oncology

## 2019-01-27 DIAGNOSIS — C50511 Malignant neoplasm of lower-outer quadrant of right female breast: Secondary | ICD-10-CM

## 2019-02-01 NOTE — Assessment & Plan Note (Signed)
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%, T1CN0 stage Ib  Review: Superior marginwas broadly positive: Patient had surgery 10/23/2018: Benign Peripheral blood flow cytometry: Monoclonal B-cell population kappa restricted lymphocytes CD20 positive but lacks CD5, CD10, CD23 or CD103: B-cell lymphoproliferative process  Treatment plan: 1.Adjuvant chemotherapy with CMF x6 cycles tentative start date 11/15/2018 2.Followed by adjuvant radiation ----------------------------------------------------------------------------------------------------------------------------------------------------- Current treatment: Cycle5CMF Chemo toxicities: 1.Mild nausea: Patient did not take any antiemetics 2.Worseningfatigue: Fatigue is continued but she is still trying to exercise a few minutes every day. 3.  Occasional dizziness She tells me that she has lost appetite for junk food. She has been drinking more water.  Chronic kidney disease: We  reduced the dosage of methotrexate for low GFR.  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 Return to clinic in3weeksfor cycle6

## 2019-02-04 ENCOUNTER — Ambulatory Visit (INDEPENDENT_AMBULATORY_CARE_PROVIDER_SITE_OTHER): Payer: Medicare Other | Admitting: *Deleted

## 2019-02-04 DIAGNOSIS — I428 Other cardiomyopathies: Secondary | ICD-10-CM | POA: Diagnosis not present

## 2019-02-04 LAB — CUP PACEART REMOTE DEVICE CHECK
Battery Remaining Longevity: 124 mo
Battery Voltage: 3.01 V
Brady Statistic RV Percent Paced: 0.01 %
Date Time Interrogation Session: 20200727131535
HighPow Impedance: 38 Ohm
HighPow Impedance: 47 Ohm
Implantable Lead Implant Date: 20081215
Implantable Lead Location: 753860
Implantable Lead Model: 6947
Implantable Pulse Generator Implant Date: 20190418
Lead Channel Impedance Value: 342 Ohm
Lead Channel Impedance Value: 437 Ohm
Lead Channel Pacing Threshold Amplitude: 0.625 V
Lead Channel Pacing Threshold Pulse Width: 0.4 ms
Lead Channel Sensing Intrinsic Amplitude: 14.625 mV
Lead Channel Sensing Intrinsic Amplitude: 14.625 mV
Lead Channel Setting Pacing Amplitude: 2.5 V
Lead Channel Setting Pacing Pulse Width: 0.4 ms
Lead Channel Setting Sensing Sensitivity: 0.3 mV

## 2019-02-06 NOTE — Progress Notes (Signed)
Patient Care Team: Biagio Borg, MD as PCP - General Stanford Breed, Denice Bors, MD as Consulting Physician (Cardiology) Deboraha Sprang, MD as Consulting Physician (Cardiology) Rockwell Germany, RN as Oncology Nurse Navigator Mauro Kaufmann, RN as Oncology Nurse Navigator Nicholas Lose, MD as Consulting Physician (Hematology and Oncology) Stark Klein, MD as Consulting Physician (General Surgery) Eppie Gibson, MD as Attending Physician (Radiation Oncology)  DIAGNOSIS:    ICD-10-CM   1. Malignant neoplasm of lower-outer quadrant of right breast of female, estrogen receptor negative ([facility])  C50.511    Z17.1     SUMMARY OF ONCOLOGIC HISTORY: Oncology History  Malignant neoplasm of lower-outer quadrant of right breast of female, estrogen receptor negative ([facility])  08/29/2018 Initial Diagnosis   Screening mammogram detected 2 adjacent masses right breast 8:30 position LOQ 8 cm from nipple 1.1 cm and 5 mm, combined mass 1.7 cm, axilla negative, biopsy: Grade 3 IDC triple negative, Ki-67 80%, T1CN0 stage 1B clinical stage   09/05/2018 Cancer Staging   Staging form: Breast, AJCC 8th Edition - Clinical stage from 09/05/2018: Stage IB (cT1c, cN0, cM0, G3, ER-, PR-, HER2-) - Signed by Nicholas Lose, MD on 09/05/2018   10/11/2018 Surgery   Right lumpectomy: Grade 3 IDC, 1.6 cm, 0/3 lymph nodes negative, superior margin broadly positive, triple negative, Ki-67 80%, T1CN0 stage Ib. Re-excision 10/23/18.   10/16/2018 Cancer Staging   Staging form: Breast, AJCC 8th Edition - Pathologic stage from 10/16/2018: Stage IB (pT1c, pN0, cM0, G3, ER-, PR-, HER2-) - Signed by Nicholas Lose, MD on 10/16/2018   11/15/2018 -  Chemotherapy   The patient had palonosetron (ALOXI) injection 0.25 mg, 0.25 mg, Intravenous,  Once, 4 of 6 cycles Administration: 0.25 mg (11/15/2018), 0.25 mg (12/06/2018), 0.25 mg (12/27/2018), 0.25 mg (01/17/2019) methotrexate (PF) chemo injection 65 mg, 84.5 mg, Intravenous,  Once, 4 of 6 cycles Dose  modification: 30 mg/m2 (original dose 40 mg/m2, Cycle 2, Reason: Change in SCr/CrCl, Comment: CrCl 53) Administration: 65 mg (11/15/2018), 65 mg (12/06/2018), 65 mg (12/27/2018), 63.25 mg (01/17/2019) cyclophosphamide (CYTOXAN) 1,260 mg in sodium chloride 0.9 % 250 mL chemo infusion, 600 mg/m2 = 1,260 mg, Intravenous,  Once, 4 of 6 cycles Administration: 1,260 mg (11/15/2018), 1,260 mg (12/06/2018), 1,260 mg (12/27/2018), 1,260 mg (01/17/2019) fluorouracil (ADRUCIL) chemo injection 1,250 mg, 600 mg/m2 = 1,250 mg, Intravenous,  Once, 4 of 6 cycles Administration: 1,250 mg (11/15/2018), 1,250 mg (12/06/2018), 1,250 mg (12/27/2018), 1,250 mg (01/17/2019)  for chemotherapy treatment.      CHIEF COMPLIANT: Cycle 5 Day 1 CMF  INTERVAL HISTORY: ALIANIS TRIMMER is a 78 y.o. with above-mentioned history of right breast cancer who underwent a lumpectomyandre-excision.She is currently on adjuvant chemotherapy with CMF.She presents to the clinic today today forcycle5day 1.  REVIEW OF SYSTEMS:   Constitutional: Denies fevers, chills or abnormal weight loss Eyes: Denies blurriness of vision Ears, nose, mouth, throat, and face: Denies mucositis or sore throat Respiratory: Denies cough, dyspnea or wheezes Cardiovascular: Denies palpitation, chest discomfort Gastrointestinal: Denies nausea, heartburn or change in bowel habits Skin: Denies abnormal skin rashes Lymphatics: Denies new lymphadenopathy or easy bruising Neurological: Denies numbness, tingling or new weaknesses Behavioral/Psych: Mood is stable, no new changes  Extremities: No lower extremity edema Breast: denies any pain or lumps or nodules in either breasts All other systems were reviewed with the patient and are negative.  I have reviewed the past medical history, past surgical history, social history and family history with the patient and they are unchanged  from previous note.  ALLERGIES:  is allergic to topamax [topiramate] and zocor [simvastatin  - high dose].  MEDICATIONS:  Current Outpatient Medications  Medication Sig Dispense Refill  . albuterol (PROVENTIL HFA;VENTOLIN HFA) 108 (90 Base) MCG/ACT inhaler Inhale 1 puff into the lungs every 6 (six) hours as needed for wheezing or shortness of breath.    . carvedilol (COREG) 25 MG tablet Take 1 tablet (25 mg total) by mouth 2 (two) times daily with a meal. Take 25 mg by mouth 2 (two) times daily with a meal. 60 tablet 5  . cholecalciferol (VITAMIN D3) 25 MCG (1000 UT) tablet Take 1,000 Units by mouth daily.    [name] ENTRESTO 24-26 MG TAKE 1 TABLET BY MOUTH 2 (TWO) TIMES DAILY. 180 tablet 3  . furosemide (LASIX) 40 MG tablet Take 40 mg by mouth 2 (two) times daily.    [name] lidocaine-prilocaine (EMLA) cream Apply to affected area once 30 g 3  . LORazepam (ATIVAN) 0.5 MG tablet Take 1 tablet (0.5 mg total) by mouth at bedtime as needed for sleep. 30 tablet 0  . Multiple Vitamin (MULTIVITAMIN WITH MINERALS) TABS tablet Take 1 tablet by mouth daily.    . ondansetron (ZOFRAN) 8 MG tablet TAKE 1 TABLET BY MOUTH 2 TIMES DAILY AS NEEDED FOR REFRACTORY NAUSEA / VOMITING. START ON DAY 3 AFTER CHEMOTHERAPY. 30 tablet 1  . oxyCODONE (OXY IR/ROXICODONE) 5 MG immediate release tablet Take 0.5-1 tablets (2.5-5 mg total) by mouth every 6 (six) hours as needed for severe pain. 20 tablet 0  . prochlorperazine (COMPAZINE) 10 MG tablet TAKE 1 TABLET (10 MG TOTAL) BY MOUTH EVERY 6 (SIX) HOURS AS NEEDED (NAUSEA OR VOMITING). 30 tablet 1  . rizatriptan (MAXALT) 10 MG tablet Take 10 mg by mouth as needed for migraine. May repeat in 2 hours if needed    . spironolactone (ALDACTONE) 25 MG tablet Take 0.5 tablets (12.5 mg total) by mouth daily. 45 tablet 3   No current facility-administered medications for this visit.     PHYSICAL EXAMINATION: ECOG PERFORMANCE STATUS: 1 - Symptomatic but completely ambulatory  Vitals:   02/07/19 1004  BP: (!) 115/58  Pulse: 72  Resp: 16  Temp: 98.5 F (36.9 C)  SpO2: 100%    Filed Weights   02/07/19 1004  Weight: 222 lb 14.4 oz (101.1 kg)    GENERAL: alert, no distress and comfortable SKIN: skin color, texture, turgor are normal, no rashes or significant lesions EYES: normal, Conjunctiva are pink and non-injected, sclera clear OROPHARYNX: no exudate, no erythema and lips, buccal mucosa, and tongue normal  NECK: supple, thyroid normal size, non-tender, without nodularity LYMPH: no palpable lymphadenopathy in the cervical, axillary or inguinal LUNGS: clear to auscultation and percussion with normal breathing effort HEART: regular rate & rhythm and no murmurs and no lower extremity edema ABDOMEN: abdomen soft, non-tender and normal bowel sounds MUSCULOSKELETAL: no cyanosis of digits and no clubbing  NEURO: alert & oriented x 3 with fluent speech, no focal motor/sensory deficits EXTREMITIES: No lower extremity edema  LABORATORY DATA:  I have reviewed the data as listed CMP Latest Ref Rng & Units 01/17/2019 12/27/2018 12/06/2018  Glucose 70 - 99 mg/dL 93 92 99  BUN 8 - 23 mg/dL 20 17 25(H)  Creatinine 0.44 - 1.00 mg/dL 1.37(H) 1.38(H) 1.45(H)  Sodium 135 - 145 mmol/L 140 141 138  Potassium 3.5 - 5.1 mmol/L 4.3 4.2 3.9  Chloride 98 - 111 mmol/L 103 104 103  CO2 22 - 32  mmol/L 25 28 26   Calcium 8.9 - 10.3 mg/dL 9.0 9.2 9.1  Total Protein 6.5 - 8.1 g/dL 7.1 7.2 7.1  Total Bilirubin 0.3 - 1.2 mg/dL 0.2(L) 0.2(L) 0.2(L)  Alkaline Phos 38 - 126 U/L 91 93 88  AST 15 - 41 U/L 16 14(L) 17  ALT 0 - 44 U/L 11 10 13     Lab Results  Component Value Date   WBC 4.5 02/07/2019   HGB 9.7 (L) 02/07/2019   HCT 30.6 (L) 02/07/2019   MCV 82.7 02/07/2019   PLT 253 02/07/2019   NEUTROABS 1.6 (L) 02/07/2019    ASSESSMENT & PLAN:  Malignant neoplasm of lower-outer quadrant of right breast of female, estrogen receptor negative ([facility]) 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%, T1CN0  stage Ib  Review: Superior marginwas broadly positive: Patient had surgery 10/23/2018: Benign Peripheral blood flow cytometry: Monoclonal B-cell population kappa restricted lymphocytes CD20 positive but lacks CD5, CD10, CD23 or CD103: B-cell lymphoproliferative process  Treatment plan: 1.Adjuvant chemotherapy with CMF x6 cycles tentative start date 11/15/2018 2.Followed by adjuvant radiation ----------------------------------------------------------------------------------------------------------------------------------------------------- Current treatment: Cycle5CMF Chemo toxicities: 1.Mild nausea: Patient did not take any antiemetics 2.Worseningfatigue: Fatigue is continued but she is still trying to exercise a few minutes every day. 3.  Occasional dizziness: She has fallen once.  Because of this I am reducing the dosage of her chemotherapy today. She tells me that she has lost appetite for junk food. She has been drinking more water.  Chronic kidney disease: We  reduced the dosage of methotrexate for low GFR.  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 Return to clinic in3weeksfor cycle6     No orders of the defined types were placed in this encounter.  The patient has a good understanding of the overall plan. she agrees with it. she will call with any problems that may develop before the next visit here.  Nicholas Lose, MD 02/07/2019  Julious Oka Dorshimer am acting as scribe for Dr. Nicholas Lose.  I have reviewed the above documentation for accuracy and completeness, and I agree with the above.

## 2019-02-07 ENCOUNTER — Inpatient Hospital Stay (HOSPITAL_BASED_OUTPATIENT_CLINIC_OR_DEPARTMENT_OTHER): Payer: Medicare Other | Admitting: Hematology and Oncology

## 2019-02-07 ENCOUNTER — Other Ambulatory Visit: Payer: Self-pay

## 2019-02-07 ENCOUNTER — Inpatient Hospital Stay: Payer: Medicare Other

## 2019-02-07 DIAGNOSIS — C50511 Malignant neoplasm of lower-outer quadrant of right female breast: Secondary | ICD-10-CM

## 2019-02-07 DIAGNOSIS — Z5111 Encounter for antineoplastic chemotherapy: Secondary | ICD-10-CM | POA: Diagnosis not present

## 2019-02-07 DIAGNOSIS — R42 Dizziness and giddiness: Secondary | ICD-10-CM

## 2019-02-07 DIAGNOSIS — R5383 Other fatigue: Secondary | ICD-10-CM | POA: Diagnosis not present

## 2019-02-07 DIAGNOSIS — Z9221 Personal history of antineoplastic chemotherapy: Secondary | ICD-10-CM

## 2019-02-07 DIAGNOSIS — Z171 Estrogen receptor negative status [ER-]: Secondary | ICD-10-CM

## 2019-02-07 DIAGNOSIS — R110 Nausea: Secondary | ICD-10-CM

## 2019-02-07 DIAGNOSIS — Z79899 Other long term (current) drug therapy: Secondary | ICD-10-CM

## 2019-02-07 DIAGNOSIS — N189 Chronic kidney disease, unspecified: Secondary | ICD-10-CM

## 2019-02-07 DIAGNOSIS — Z95828 Presence of other vascular implants and grafts: Secondary | ICD-10-CM

## 2019-02-07 LAB — CBC WITH DIFFERENTIAL (CANCER CENTER ONLY)
Abs Immature Granulocytes: 0.03 10*3/uL (ref 0.00–0.07)
Basophils Absolute: 0 10*3/uL (ref 0.0–0.1)
Basophils Relative: 1 %
Eosinophils Absolute: 0.2 10*3/uL (ref 0.0–0.5)
Eosinophils Relative: 5 %
HCT: 30.6 % — ABNORMAL LOW (ref 36.0–46.0)
Hemoglobin: 9.7 g/dL — ABNORMAL LOW (ref 12.0–15.0)
Immature Granulocytes: 1 %
Lymphocytes Relative: 40 %
Lymphs Abs: 1.8 10*3/uL (ref 0.7–4.0)
MCH: 26.2 pg (ref 26.0–34.0)
MCHC: 31.7 g/dL (ref 30.0–36.0)
MCV: 82.7 fL (ref 80.0–100.0)
Monocytes Absolute: 0.8 10*3/uL (ref 0.1–1.0)
Monocytes Relative: 18 %
Neutro Abs: 1.6 10*3/uL — ABNORMAL LOW (ref 1.7–7.7)
Neutrophils Relative %: 35 %
Platelet Count: 253 10*3/uL (ref 150–400)
RBC: 3.7 MIL/uL — ABNORMAL LOW (ref 3.87–5.11)
RDW: 18.2 % — ABNORMAL HIGH (ref 11.5–15.5)
WBC Count: 4.5 10*3/uL (ref 4.0–10.5)
nRBC: 0 % (ref 0.0–0.2)

## 2019-02-07 LAB — CMP (CANCER CENTER ONLY)
ALT: 10 U/L (ref 0–44)
AST: 14 U/L — ABNORMAL LOW (ref 15–41)
Albumin: 3.7 g/dL (ref 3.5–5.0)
Alkaline Phosphatase: 92 U/L (ref 38–126)
Anion gap: 10 (ref 5–15)
BUN: 21 mg/dL (ref 8–23)
CO2: 25 mmol/L (ref 22–32)
Calcium: 9.3 mg/dL (ref 8.9–10.3)
Chloride: 104 mmol/L (ref 98–111)
Creatinine: 1.48 mg/dL — ABNORMAL HIGH (ref 0.44–1.00)
GFR, Est AFR Am: 39 mL/min — ABNORMAL LOW (ref >60)
GFR, Estimated: 34 mL/min — ABNORMAL LOW (ref >60)
Glucose, Bld: 96 mg/dL (ref 70–99)
Potassium: 4.4 mmol/L (ref 3.5–5.1)
Sodium: 139 mmol/L (ref 135–145)
Total Bilirubin: 0.2 mg/dL — ABNORMAL LOW (ref 0.3–1.2)
Total Protein: 7.2 g/dL (ref 6.5–8.1)

## 2019-02-07 MED ORDER — FLUOROURACIL CHEMO INJECTION 2.5 GM/50ML
400.0000 mg/m2 | Freq: Once | INTRAVENOUS | Status: AC
  Administered 2019-02-07: 12:00:00 850 mg via INTRAVENOUS
  Filled 2019-02-07: qty 17

## 2019-02-07 MED ORDER — PALONOSETRON HCL INJECTION 0.25 MG/5ML
0.2500 mg | Freq: Once | INTRAVENOUS | Status: AC
  Administered 2019-02-07: 0.25 mg via INTRAVENOUS

## 2019-02-07 MED ORDER — PALONOSETRON HCL INJECTION 0.25 MG/5ML
INTRAVENOUS | Status: AC
  Filled 2019-02-07: qty 5

## 2019-02-07 MED ORDER — SODIUM CHLORIDE 0.9% FLUSH
10.0000 mL | INTRAVENOUS | Status: DC | PRN
  Administered 2019-02-07: 13:00:00 10 mL
  Filled 2019-02-07: qty 10

## 2019-02-07 MED ORDER — METHOTREXATE SODIUM (PF) CHEMO INJECTION 250 MG/10ML
25.1000 mg/m2 | Freq: Once | INTRAMUSCULAR | Status: AC
  Administered 2019-02-07: 12:00:00 53 mg via INTRAVENOUS
  Filled 2019-02-07: qty 2.12

## 2019-02-07 MED ORDER — DEXAMETHASONE SODIUM PHOSPHATE 10 MG/ML IJ SOLN
INTRAMUSCULAR | Status: AC
  Filled 2019-02-07: qty 1

## 2019-02-07 MED ORDER — DEXAMETHASONE SODIUM PHOSPHATE 10 MG/ML IJ SOLN
10.0000 mg | Freq: Once | INTRAMUSCULAR | Status: AC
  Administered 2019-02-07: 11:00:00 10 mg via INTRAVENOUS

## 2019-02-07 MED ORDER — SODIUM CHLORIDE 0.9 % IV SOLN
Freq: Once | INTRAVENOUS | Status: AC
  Administered 2019-02-07: 11:00:00 via INTRAVENOUS
  Filled 2019-02-07: qty 250

## 2019-02-07 MED ORDER — SODIUM CHLORIDE 0.9 % IV SOLN
500.0000 mg/m2 | Freq: Once | INTRAVENOUS | Status: AC
  Administered 2019-02-07: 1060 mg via INTRAVENOUS
  Filled 2019-02-07: qty 53

## 2019-02-07 MED ORDER — HEPARIN SOD (PORK) LOCK FLUSH 100 UNIT/ML IV SOLN
500.0000 [IU] | Freq: Once | INTRAVENOUS | Status: AC | PRN
  Administered 2019-02-07: 13:00:00 500 [IU]
  Filled 2019-02-07: qty 5

## 2019-02-07 MED ORDER — SODIUM CHLORIDE 0.9% FLUSH
10.0000 mL | Freq: Once | INTRAVENOUS | Status: AC
  Administered 2019-02-07: 10:00:00 10 mL
  Filled 2019-02-07: qty 10

## 2019-02-07 NOTE — Patient Instructions (Signed)
[facility] Discharge Instructions for Patients Receiving Chemotherapy  Today you received the following chemotherapy agents Cyclophosphamide (CYTOXAN), Methotrexate (PF) & Flourouracil (ADRUCIL).  To help prevent nausea and vomiting after your treatment, we encourage you to take your nausea medication as prescribed.   If you develop nausea and vomiting that is not controlled by your nausea medication, call the clinic.   BELOW ARE SYMPTOMS THAT SHOULD BE REPORTED IMMEDIATELY:  *FEVER GREATER THAN 100.5 F  *CHILLS WITH OR WITHOUT FEVER  NAUSEA AND VOMITING THAT IS NOT CONTROLLED WITH YOUR NAUSEA MEDICATION  *UNUSUAL SHORTNESS OF BREATH  *UNUSUAL BRUISING OR BLEEDING  TENDERNESS IN MOUTH AND THROAT WITH OR WITHOUT PRESENCE OF ULCERS  *URINARY PROBLEMS  *BOWEL PROBLEMS  UNUSUAL RASH Items with * indicate a potential emergency and should be followed up as soon as possible.  Feel free to call the clinic should you have any questions or concerns. The clinic phone number is [phone].  Please show the [facility] at check-in to the Emergency Department and triage nurse.  Coronavirus (COVID-19) Are you at risk?  Are you at risk for the Coronavirus (COVID-19)?  To be considered HIGH RISK for Coronavirus (COVID-19), you have to meet the following criteria:  . Traveled to [place], [place], [place], [place] or [place]; or in the [place] to [place], [place], [place], or [place]; and have fever, cough, and shortness of breath within the last 2 weeks of travel OR . Been in close contact with a person diagnosed with COVID-19 within the last 2 weeks and have fever, cough, and shortness of breath . IF YOU DO NOT MEET THESE CRITERIA, YOU ARE CONSIDERED LOW RISK FOR COVID-19.  What to do if you are HIGH RISK for COVID-19?  [name] If you are having a medical emergency, call 911. . Seek medical care right away. Before you go to a doctor's office, urgent care or  emergency department, call ahead and tell them about your recent travel, contact with someone diagnosed with COVID-19, and your symptoms. You should receive instructions from your physician's office regarding next steps of care.  . When you arrive at healthcare provider, tell the healthcare staff immediately you have returned from visiting [place], [place], [place], [place] or [place]; or traveled in the [place] to [place], [place], [place], or [place]; in the last two weeks or you have been in close contact with a person diagnosed with COVID-19 in the last 2 weeks.   . Tell the health care staff about your symptoms: fever, cough and shortness of breath. . After you have been seen by a medical provider, you will be either: o Tested for (COVID-19) and discharged home on quarantine except to seek medical care if symptoms worsen, and asked to  - Stay home and avoid contact with others until you get your results (4-5 days)  - Avoid travel on public transportation if possible (such as bus, train, or airplane) or o Sent to the Emergency Department by EMS for evaluation, COVID-19 testing, and possible admission depending on your condition and test results.  What to do if you are LOW RISK for COVID-19?  Reduce your risk of any infection by using the same precautions used for avoiding the common cold or flu:  [name] Wash your hands often with soap and warm water for at least 20 seconds.  If soap and water are not readily available, use an alcohol-based hand sanitizer with at least 60% alcohol.  [name]  If coughing or sneezing, cover your mouth and nose by coughing or sneezing into the elbow areas of your shirt or coat, into a tissue or into your sleeve (not your hands). . Avoid shaking hands with others and consider head nods or verbal greetings only. . Avoid touching your eyes, nose, or mouth with unwashed hands.  . Avoid close contact with people who are sick. . Avoid places or events with large numbers  of people in one location, like concerts or sporting events. . Carefully consider travel plans you have or are making. . If you are planning any travel outside or inside the [place], visit the CDC's Travelers' Health webpage for the latest health notices. . If you have some symptoms but not all symptoms, continue to monitor at home and seek medical attention if your symptoms worsen. . If you are having a medical emergency, call 911.   [place] / e-Visit: [email or web address]         [facility]: [facility]: [phone]                   [facility]: [phone]

## 2019-02-11 ENCOUNTER — Telehealth: Payer: Self-pay | Admitting: Radiation Oncology

## 2019-02-11 NOTE — Telephone Encounter (Signed)
New message: ° ° ° °LVM for patient to return call to set up appt from referral received. °

## 2019-02-21 NOTE — Assessment & Plan Note (Signed)
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%, T1CN0 stage Ib  Review: Superior marginwas broadly positive: Patient had surgery 10/23/2018: Benign Peripheral blood flow cytometry: Monoclonal B-cell population kappa restricted lymphocytes CD20 positive but lacks CD5, CD10, CD23 or CD103: B-cell lymphoproliferative process  Treatment plan: 1.Adjuvant chemotherapy with CMF x6 cycles tentative start date 11/15/2018 2.Followed by adjuvant radiation ----------------------------------------------------------------------------------------------------------------------------------------------------- Current treatment: Cycle6CMF Chemo toxicities: 1.Mild nausea: Patient did not take any antiemetics 2.Worseningfatigue:Fatigue is continued but she is still trying to exercise a few minutes every day. 3.Occasional dizziness: She has fallen once.  Because of this I am reducing the dosage of her chemotherapy today. She tells me that she has lost appetite for junk food. She has been drinking more water.  Chronic kidney disease: We reducedthe dosage of methotrexate for low GFR. 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 We will request to remove the port. We will make an appointment for adjuvant radiation. Return to clinic at the end of radiation therapy.

## 2019-02-22 ENCOUNTER — Encounter: Payer: Self-pay | Admitting: Cardiology

## 2019-02-22 NOTE — Progress Notes (Signed)
Remote ICD transmission.   

## 2019-02-27 NOTE — Progress Notes (Signed)
Patient Care Team: Biagio Borg, MD as PCP - General Stanford Breed, Denice Bors, MD as Consulting Physician (Cardiology) Deboraha Sprang, MD as Consulting Physician (Cardiology) Rockwell Germany, RN as Oncology Nurse Navigator Mauro Kaufmann, RN as Oncology Nurse Navigator Nicholas Lose, MD as Consulting Physician (Hematology and Oncology) Stark Klein, MD as Consulting Physician (General Surgery) Eppie Gibson, MD as Attending Physician (Radiation Oncology)  DIAGNOSIS:    ICD-10-CM   1. Malignant neoplasm of lower-outer quadrant of right breast of female, estrogen receptor negative ([place])  C50.511    Z17.1     SUMMARY OF ONCOLOGIC HISTORY: Oncology History  Malignant neoplasm of lower-outer quadrant of right breast of female, estrogen receptor negative ([place])  08/29/2018 Initial Diagnosis   Screening mammogram detected 2 adjacent masses right breast 8:30 position LOQ 8 cm from nipple 1.1 cm and 5 mm, combined mass 1.7 cm, axilla negative, biopsy: Grade 3 IDC triple negative, Ki-67 80%, T1CN0 stage 1B clinical stage   09/05/2018 Cancer Staging   Staging form: Breast, AJCC 8th Edition - Clinical stage from 09/05/2018: Stage IB (cT1c, cN0, cM0, G3, ER-, PR-, HER2-) - Signed by Nicholas Lose, MD on 09/05/2018   10/11/2018 Surgery   Right lumpectomy: Grade 3 IDC, 1.6 cm, 0/3 lymph nodes negative, superior margin broadly positive, triple negative, Ki-67 80%, T1CN0 stage Ib. Re-excision 10/23/18.   10/16/2018 Cancer Staging   Staging form: Breast, AJCC 8th Edition - Pathologic stage from 10/16/2018: Stage IB (pT1c, pN0, cM0, G3, ER-, PR-, HER2-) - Signed by Nicholas Lose, MD on 10/16/2018   11/15/2018 -  Chemotherapy   The patient had palonosetron (ALOXI) injection 0.25 mg, 0.25 mg, Intravenous,  Once, 5 of 6 cycles Administration: 0.25 mg (11/15/2018), 0.25 mg (12/06/2018), 0.25 mg (12/27/2018), 0.25 mg (01/17/2019), 0.25 mg (02/07/2019) methotrexate (PF) chemo injection 65 mg, 84.5 mg, Intravenous,  Once, 5 of  6 cycles Dose modification: 30 mg/m2 (original dose 40 mg/m2, Cycle 2, Reason: Change in SCr/CrCl, Comment: CrCl 53), 25 mg/m2 (original dose 40 mg/m2, Cycle 5, Reason: Dose not tolerated) Administration: 65 mg (11/15/2018), 65 mg (12/06/2018), 65 mg (12/27/2018), 63.25 mg (01/17/2019), 53 mg (02/07/2019) cyclophosphamide (CYTOXAN) 1,260 mg in sodium chloride 0.9 % 250 mL chemo infusion, 600 mg/m2 = 1,260 mg, Intravenous,  Once, 5 of 6 cycles Dose modification: 500 mg/m2 (original dose 600 mg/m2, Cycle 5, Reason: Provider Judgment) Administration: 1,260 mg (11/15/2018), 1,260 mg (12/06/2018), 1,260 mg (12/27/2018), 1,260 mg (01/17/2019), 1,060 mg (02/07/2019) fluorouracil (ADRUCIL) chemo injection 1,250 mg, 600 mg/m2 = 1,250 mg, Intravenous,  Once, 5 of 6 cycles Dose modification: 400 mg/m2 (original dose 600 mg/m2, Cycle 5, Reason: Dose not tolerated) Administration: 1,250 mg (11/15/2018), 1,250 mg (12/06/2018), 1,250 mg (12/27/2018), 1,250 mg (01/17/2019), 850 mg (02/07/2019)  for chemotherapy treatment.      CHIEF COMPLIANT: Cycle 6 Day 1 CMF  INTERVAL HISTORY: [name] [name] is a 78 y.o. with above-mentioned history of right breast cancer who underwent a lumpectomyandre-excision.She is currently on adjuvant chemotherapy with CMF.She presents to the clinic today today forcycle6.   REVIEW OF SYSTEMS:   Constitutional: Denies fevers, chills or abnormal weight loss Eyes: Denies blurriness of vision Ears, nose, mouth, throat, and face: Denies mucositis or sore throat Respiratory: Denies cough, dyspnea or wheezes Cardiovascular: Denies palpitation, chest discomfort Gastrointestinal: Denies nausea, heartburn or change in bowel habits Skin: Denies abnormal skin rashes Lymphatics: Denies new lymphadenopathy or easy bruising Neurological: Denies numbness, tingling or new weaknesses Behavioral/Psych: Mood is stable, no new changes  Extremities: No lower extremity edema Breast: denies any pain or lumps or  nodules in either breasts All other systems were reviewed with the patient and are negative.  I have reviewed the past medical history, past surgical history, social history and family history with the patient and they are unchanged from previous note.  ALLERGIES:  is allergic to topamax [topiramate] and zocor [simvastatin - high dose].  MEDICATIONS:  Current Outpatient Medications  Medication Sig Dispense Refill  . albuterol (PROVENTIL HFA;VENTOLIN HFA) 108 (90 Base) MCG/ACT inhaler Inhale 1 puff into the lungs every 6 (six) hours as needed for wheezing or shortness of breath.    . carvedilol (COREG) 25 MG tablet Take 1 tablet (25 mg total) by mouth 2 (two) times daily with a meal. Take 25 mg by mouth 2 (two) times daily with a meal. 60 tablet 5  . cholecalciferol (VITAMIN D3) 25 MCG (1000 UT) tablet Take 1,000 Units by mouth daily.    [name] ENTRESTO 24-26 MG TAKE 1 TABLET BY MOUTH 2 (TWO) TIMES DAILY. 180 tablet 3  . furosemide (LASIX) 40 MG tablet Take 40 mg by mouth 2 (two) times daily.    [name] lidocaine-prilocaine (EMLA) cream Apply to affected area once 30 g 3  . LORazepam (ATIVAN) 0.5 MG tablet Take 1 tablet (0.5 mg total) by mouth at bedtime as needed for sleep. 30 tablet 0  . Multiple Vitamin (MULTIVITAMIN WITH MINERALS) TABS tablet Take 1 tablet by mouth daily.    . ondansetron (ZOFRAN) 8 MG tablet TAKE 1 TABLET BY MOUTH 2 TIMES DAILY AS NEEDED FOR REFRACTORY NAUSEA / VOMITING. START ON DAY 3 AFTER CHEMOTHERAPY. 30 tablet 1  . oxyCODONE (OXY IR/ROXICODONE) 5 MG immediate release tablet Take 0.5-1 tablets (2.5-5 mg total) by mouth every 6 (six) hours as needed for severe pain. 20 tablet 0  . prochlorperazine (COMPAZINE) 10 MG tablet TAKE 1 TABLET (10 MG TOTAL) BY MOUTH EVERY 6 (SIX) HOURS AS NEEDED (NAUSEA OR VOMITING). 30 tablet 1  . rizatriptan (MAXALT) 10 MG tablet Take 10 mg by mouth as needed for migraine. May repeat in 2 hours if needed    . spironolactone (ALDACTONE) 25 MG tablet Take  0.5 tablets (12.5 mg total) by mouth daily. 45 tablet 3   No current facility-administered medications for this visit.     PHYSICAL EXAMINATION: ECOG PERFORMANCE STATUS: 1 - Symptomatic but completely ambulatory  Vitals:   02/28/19 1010  BP: 115/63  Pulse: 69  Resp: 18  Temp: 98.7 F (37.1 C)  SpO2: 100%   Filed Weights   02/28/19 1010  Weight: 225 lb 3.2 oz (102.2 kg)    GENERAL: alert, no distress and comfortable SKIN: skin color, texture, turgor are normal, no rashes or significant lesions EYES: normal, Conjunctiva are pink and non-injected, sclera clear OROPHARYNX: no exudate, no erythema and lips, buccal mucosa, and tongue normal  NECK: supple, thyroid normal size, non-tender, without nodularity LYMPH: no palpable lymphadenopathy in the cervical, axillary or inguinal LUNGS: clear to auscultation and percussion with normal breathing effort HEART: regular rate & rhythm and no murmurs and no lower extremity edema ABDOMEN: abdomen soft, non-tender and normal bowel sounds MUSCULOSKELETAL: no cyanosis of digits and no clubbing  NEURO: alert & oriented x 3 with fluent speech, no focal motor/sensory deficits EXTREMITIES: No lower extremity edema  LABORATORY DATA:  I have reviewed the data as listed CMP Latest Ref Rng & Units 02/07/2019 01/17/2019 12/27/2018  Glucose 70 - 99 mg/dL 96 93 92  BUN 8 - 23 mg/dL 21 20 17   Creatinine 0.44 - 1.00 mg/dL 1.48(H) 1.37(H) 1.38(H)  Sodium 135 - 145 mmol/L 139 140 141  Potassium 3.5 - 5.1 mmol/L 4.4 4.3 4.2  Chloride 98 - 111 mmol/L 104 103 104  CO2 22 - 32 mmol/L 25 25 28   Calcium 8.9 - 10.3 mg/dL 9.3 9.0 9.2  Total Protein 6.5 - 8.1 g/dL 7.2 7.1 7.2  Total Bilirubin 0.3 - 1.2 mg/dL 0.2(L) 0.2(L) 0.2(L)  Alkaline Phos 38 - 126 U/L 92 91 93  AST 15 - 41 U/L 14(L) 16 14(L)  ALT 0 - 44 U/L 10 11 10     Lab Results  Component Value Date   WBC 4.6 02/28/2019   HGB 10.1 (L) 02/28/2019   HCT 31.6 (L) 02/28/2019   MCV 83.2 02/28/2019    PLT 290 02/28/2019   NEUTROABS 1.8 02/28/2019    ASSESSMENT & PLAN:  Malignant neoplasm of lower-outer quadrant of right breast of female, estrogen receptor negative ([place]) 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%, T1CN0 stage Ib  Review: Superior marginwas broadly positive: Patient had surgery 10/23/2018: Benign Peripheral blood flow cytometry: Monoclonal B-cell population kappa restricted lymphocytes CD20 positive but lacks CD5, CD10, CD23 or CD103: B-cell lymphoproliferative process  Treatment plan: 1.Adjuvant chemotherapy with CMF x6 cycles tentative start date 11/15/2018 2.Followed by adjuvant radiation ----------------------------------------------------------------------------------------------------------------------------------------------------- Current treatment: Cycle6CMF Chemo toxicities: 1.Mild nausea: Patient did not take any antiemetics 2.Worseningfatigue:Fatigue is continued but she is still trying to exercise a few minutes every day. 3.Occasional dizziness: She has fallen once.  Because of this I am reducing the dosage of her chemotherapy today. She tells me that she has lost appetite for junk food. She has been drinking more water.  Chronic kidney disease: We reducedthe dosage of methotrexate for low GFR. 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 We will request to remove the port. We will make an appointment for adjuvant radiation. Return to clinic in 4 months for survivorship care plan visit    No orders of the defined types were placed in this encounter.  The patient has a good understanding of the overall plan. she agrees with it. she will call with any problems that may develop before the next visit  here.  Nicholas Lose, MD 02/28/2019  Julious Oka Dorshimer am acting as scribe for Dr. Nicholas Lose.  I have reviewed the above documentation for accuracy and completeness, and I agree with the above.

## 2019-02-28 ENCOUNTER — Encounter: Payer: Self-pay | Admitting: *Deleted

## 2019-02-28 ENCOUNTER — Other Ambulatory Visit: Payer: Self-pay | Admitting: General Surgery

## 2019-02-28 ENCOUNTER — Inpatient Hospital Stay: Payer: Medicare Other | Attending: Hematology and Oncology

## 2019-02-28 ENCOUNTER — Inpatient Hospital Stay: Payer: Medicare Other

## 2019-02-28 ENCOUNTER — Inpatient Hospital Stay: Payer: Medicare Other | Admitting: Hematology and Oncology

## 2019-02-28 ENCOUNTER — Other Ambulatory Visit: Payer: Self-pay

## 2019-02-28 DIAGNOSIS — R110 Nausea: Secondary | ICD-10-CM | POA: Diagnosis not present

## 2019-02-28 DIAGNOSIS — C50511 Malignant neoplasm of lower-outer quadrant of right female breast: Secondary | ICD-10-CM

## 2019-02-28 DIAGNOSIS — R5383 Other fatigue: Secondary | ICD-10-CM | POA: Diagnosis not present

## 2019-02-28 DIAGNOSIS — Z79899 Other long term (current) drug therapy: Secondary | ICD-10-CM | POA: Insufficient documentation

## 2019-02-28 DIAGNOSIS — Z171 Estrogen receptor negative status [ER-]: Secondary | ICD-10-CM | POA: Insufficient documentation

## 2019-02-28 DIAGNOSIS — Z9221 Personal history of antineoplastic chemotherapy: Secondary | ICD-10-CM | POA: Insufficient documentation

## 2019-02-28 DIAGNOSIS — N189 Chronic kidney disease, unspecified: Secondary | ICD-10-CM | POA: Insufficient documentation

## 2019-02-28 DIAGNOSIS — R42 Dizziness and giddiness: Secondary | ICD-10-CM | POA: Diagnosis not present

## 2019-02-28 DIAGNOSIS — Z95828 Presence of other vascular implants and grafts: Secondary | ICD-10-CM

## 2019-02-28 LAB — CBC WITH DIFFERENTIAL (CANCER CENTER ONLY)
Abs Immature Granulocytes: 0.02 10*3/uL (ref 0.00–0.07)
Basophils Absolute: 0.1 10*3/uL (ref 0.0–0.1)
Basophils Relative: 1 %
Eosinophils Absolute: 0.2 10*3/uL (ref 0.0–0.5)
Eosinophils Relative: 5 %
HCT: 31.6 % — ABNORMAL LOW (ref 36.0–46.0)
Hemoglobin: 10.1 g/dL — ABNORMAL LOW (ref 12.0–15.0)
Immature Granulocytes: 0 %
Lymphocytes Relative: 37 %
Lymphs Abs: 1.7 10*3/uL (ref 0.7–4.0)
MCH: 26.6 pg (ref 26.0–34.0)
MCHC: 32 g/dL (ref 30.0–36.0)
MCV: 83.2 fL (ref 80.0–100.0)
Monocytes Absolute: 0.8 10*3/uL (ref 0.1–1.0)
Monocytes Relative: 17 %
Neutro Abs: 1.8 10*3/uL (ref 1.7–7.7)
Neutrophils Relative %: 40 %
Platelet Count: 290 10*3/uL (ref 150–400)
RBC: 3.8 MIL/uL — ABNORMAL LOW (ref 3.87–5.11)
RDW: 17.9 % — ABNORMAL HIGH (ref 11.5–15.5)
WBC Count: 4.6 10*3/uL (ref 4.0–10.5)
nRBC: 0 % (ref 0.0–0.2)

## 2019-02-28 LAB — CMP (CANCER CENTER ONLY)
ALT: 18 U/L (ref 0–44)
AST: 17 U/L (ref 15–41)
Albumin: 3.8 g/dL (ref 3.5–5.0)
Alkaline Phosphatase: 80 U/L (ref 38–126)
Anion gap: 10 (ref 5–15)
BUN: 18 mg/dL (ref 8–23)
CO2: 24 mmol/L (ref 22–32)
Calcium: 9.4 mg/dL (ref 8.9–10.3)
Chloride: 104 mmol/L (ref 98–111)
Creatinine: 1.34 mg/dL — ABNORMAL HIGH (ref 0.44–1.00)
GFR, Est AFR Am: 44 mL/min — ABNORMAL LOW (ref >60)
GFR, Estimated: 38 mL/min — ABNORMAL LOW (ref >60)
Glucose, Bld: 107 mg/dL — ABNORMAL HIGH (ref 70–99)
Potassium: 4.7 mmol/L (ref 3.5–5.1)
Sodium: 138 mmol/L (ref 135–145)
Total Bilirubin: 0.2 mg/dL — ABNORMAL LOW (ref 0.3–1.2)
Total Protein: 7.2 g/dL (ref 6.5–8.1)

## 2019-02-28 MED ORDER — SODIUM CHLORIDE 0.9% FLUSH
10.0000 mL | Freq: Once | INTRAVENOUS | Status: AC
  Administered 2019-02-28: 10 mL
  Filled 2019-02-28: qty 10

## 2019-02-28 MED ORDER — SODIUM CHLORIDE 0.9 % IV SOLN
500.0000 mg/m2 | Freq: Once | INTRAVENOUS | Status: AC
  Administered 2019-02-28: 12:00:00 1060 mg via INTRAVENOUS
  Filled 2019-02-28: qty 53

## 2019-02-28 MED ORDER — DEXAMETHASONE SODIUM PHOSPHATE 10 MG/ML IJ SOLN
INTRAMUSCULAR | Status: AC
  Filled 2019-02-28: qty 1

## 2019-02-28 MED ORDER — FLUOROURACIL CHEMO INJECTION 2.5 GM/50ML
400.0000 mg/m2 | Freq: Once | INTRAVENOUS | Status: AC
  Administered 2019-02-28: 850 mg via INTRAVENOUS
  Filled 2019-02-28: qty 17

## 2019-02-28 MED ORDER — DEXAMETHASONE SODIUM PHOSPHATE 10 MG/ML IJ SOLN
10.0000 mg | Freq: Once | INTRAMUSCULAR | Status: AC
  Administered 2019-02-28: 10 mg via INTRAVENOUS

## 2019-02-28 MED ORDER — HEPARIN SOD (PORK) LOCK FLUSH 100 UNIT/ML IV SOLN
500.0000 [IU] | Freq: Once | INTRAVENOUS | Status: AC | PRN
  Administered 2019-02-28: 500 [IU]
  Filled 2019-02-28: qty 5

## 2019-02-28 MED ORDER — PALONOSETRON HCL INJECTION 0.25 MG/5ML
INTRAVENOUS | Status: AC
  Filled 2019-02-28: qty 5

## 2019-02-28 MED ORDER — SODIUM CHLORIDE 0.9 % IV SOLN
Freq: Once | INTRAVENOUS | Status: AC
  Administered 2019-02-28: 11:00:00 via INTRAVENOUS
  Filled 2019-02-28: qty 250

## 2019-02-28 MED ORDER — METHOTREXATE SODIUM (PF) CHEMO INJECTION 250 MG/10ML
25.1000 mg/m2 | Freq: Once | INTRAMUSCULAR | Status: AC
  Administered 2019-02-28: 53 mg via INTRAVENOUS
  Filled 2019-02-28: qty 2.12

## 2019-02-28 MED ORDER — PALONOSETRON HCL INJECTION 0.25 MG/5ML
0.2500 mg | Freq: Once | INTRAVENOUS | Status: AC
  Administered 2019-02-28: 0.25 mg via INTRAVENOUS

## 2019-02-28 MED ORDER — SODIUM CHLORIDE 0.9% FLUSH
10.0000 mL | INTRAVENOUS | Status: DC | PRN
  Administered 2019-02-28: 10 mL
  Filled 2019-02-28: qty 10

## 2019-02-28 NOTE — Patient Instructions (Signed)
[facility] Discharge Instructions for Patients Receiving Chemotherapy  Today you received the following chemotherapy agents: Cytoxan, Methotrexate, and 5FU.  To help prevent nausea and vomiting after your treatment, we encourage you to take your nausea medication as directed.   If you develop nausea and vomiting that is not controlled by your nausea medication, call the clinic.   BELOW ARE SYMPTOMS THAT SHOULD BE REPORTED IMMEDIATELY:  *FEVER GREATER THAN 100.5 F  *CHILLS WITH OR WITHOUT FEVER  NAUSEA AND VOMITING THAT IS NOT CONTROLLED WITH YOUR NAUSEA MEDICATION  *UNUSUAL SHORTNESS OF BREATH  *UNUSUAL BRUISING OR BLEEDING  TENDERNESS IN MOUTH AND THROAT WITH OR WITHOUT PRESENCE OF ULCERS  *URINARY PROBLEMS  *BOWEL PROBLEMS  UNUSUAL RASH Items with * indicate a potential emergency and should be followed up as soon as possible.  Feel free to call the clinic should you have any questions or concerns. The clinic phone number is [phone].  Please show the [facility] at check-in to the Emergency Department and triage nurse.

## 2019-03-01 ENCOUNTER — Telehealth: Payer: Self-pay | Admitting: Adult Health

## 2019-03-01 NOTE — Telephone Encounter (Signed)
I left a message regarding schedule  

## 2019-03-04 ENCOUNTER — Other Ambulatory Visit: Payer: Self-pay | Admitting: Cardiology

## 2019-03-05 NOTE — Telephone Encounter (Signed)
Please advise if OK to refill. Listed under Historical Provider.

## 2019-03-07 NOTE — Progress Notes (Signed)
Location of Breast Cancer: Right Breast  Histology per Pathology Report:  08/29/18 Diagnosis Breast, right, needle core biopsy, 8:30 o'clock , 8 cm fn - INVASIVE DUCTAL CARCINOMA, GRADE III.  Receptor Status: ER(NEG), PR (NEG), Her2-neu (NEG), Ki-(80%)  10/11/18 Diagnosis 1. Breast, lumpectomy, Right w/seed - INVASIVE DUCTAL CARCINOMA, GRADE III, 1.6 CM WITH PROMINENT LYMPHOCYTIC INFILTRATE - CARCINOMA BROADLY INVOLVES THE SUPERIOR RESECTION MARGIN - CARCINOMA IS 1 MM FROM POSTERIOR RESECTION MARGIN - CARCINOMA FOCALLY INVOLVES THE LATERAL RESECTION EDGE AND IS 2 MM FROM THE ANTERIOR RESECTION EDGE. SEE NOTE - NEGATIVE FOR LYMPHOVASCULAR OR PERINEURAL INVASION - BIOPSY SITE CHANGES - SEE ONCOLOGY TABLE 2. Breast, excision, Right additional lateral margin - BENIGN BREAST PARENCHYMA, NEGATIVE FOR CARCINOMA 3. Breast, excision, Right Additional anterior - BENIGN BREAST PARENCHYMA, NEGATIVE FOR CARCINOMA 4. Lymph node, sentinel, biopsy, Right axillary #1 - LYMPH NODE, NEGATIVE FOR CARCINOMA (0/1) 5. Lymph node, sentinel, biopsy, Right Axillary #2 - LYMPH NODE, NEGATIVE FOR CARCINOMA (0/1) 6. Lymph node, sentinel, biopsy, Right Axillary #3 - LYMPH NODE, NEGATIVE FOR CARCINOMA (0/1)  10/23/18 Diagnosis Breast, excision, Right superior margin - USUAL DUCTAL HYPERPLASIA AND FIBROCYSTIC CHANGES WITH CALCIFICATIONS - SCLEROTIC ADENOMATOID NODULE - PREVIOUS SURGICAL SITE CHANGES - NO RESIDUAL CARCINOMA IDENTIFIED    Did patient present with symptoms or was this found on screening mammography?: It was found on a screening mammogram.   Past/Anticipated interventions by surgeon, if any: 10/11/18 Dr. Barry Dienes Right subclavian port placement (8 fr bard clearvue), right breast Radioactive seed localized lumpectomy and sentinel lymph node mapping and biopsy  10/23/18 Dr. Barry Dienes Re-excisional Right Breast Lumpectomy   Past/Anticipated interventions by medical oncology, if any:  02/28/19 Dr.  Lindi Adie Treatment plan: 1.Adjuvant chemotherapy with CMF x6 cycles tentative start date 11/15/2018 2.Followed by adjuvant radiation ----------------------------------------------------------------------------------------------------------------------------------------------------- Current treatment: Cycle6CMF Chemo toxicities: 1.Mild nausea: Patient did not take any antiemetics 2.Worseningfatigue:Fatigue is continued but she is still trying to exercise a few minutes every day. 3.Occasional dizziness: She has fallen once. Because of this I am reducing the dosage of her chemotherapy today. She tells me that she has lost appetite for junk food. She has been drinking more water.  Chronic kidney disease: We reducedthe dosage of methotrexate for low GFR. 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 We will request to remove the port. We will make an appointment for adjuvant radiation. Return to clinic in 4 months for survivorship care plan visit   Lymphedema issues, if any:  She reports right arm numbness from her axilla to forearm. There is also slight swelling to this area.   Pain issues, if any:  She reports knee pain.   SAFETY ISSUES:  Prior radiation? No  Pacemaker/ICD? ICD is present.  Possible current pregnancy? No  Is the patient on methotrexate? No  Current Complaints / other details:   She is scheduled to have her PAC removed 04/10/19.     [name] [name], [name] Police, RN 03/07/2019,10:20 AM

## 2019-03-14 NOTE — Progress Notes (Signed)
Radiation Oncology         [phone] ________________________________  Name: [name] [name] MRN: [id number]  Date: 03/15/2019  DOB: [birth year]  Follow-Up Visit Note in person  Outpatient  CC: Biagio Borg, MD  Nicholas Lose, MD  Diagnosis:      ICD-10-CM   1. Malignant neoplasm of lower-outer quadrant of right breast of female, estrogen receptor negative ([place])  C50.511    Z17.1      Cancer Staging Malignant neoplasm of lower-outer quadrant of right breast of female, estrogen receptor negative ([place]) Staging form: Breast, AJCC 8th Edition - Clinical stage from 09/05/2018: Stage IB (cT1c, cN0, cM0, G3, ER-, PR-, HER2-) - Signed by Nicholas Lose, MD on 09/05/2018 - Pathologic stage from 10/16/2018: Stage IB (pT1c, pN0, cM0, G3, ER-, PR-, HER2-) - Signed by Nicholas Lose, MD on 10/16/2018   CHIEF COMPLAINT: Here to discuss management of right breast cancer  Narrative:  The patient returns today for follow-up to discuss radiation treatment options. She was seen in the multidisciplinary breast clinic on 09/05/2018.     She opted to proceed with right lumpectomy with sentinel lymph node biopsy on date of 10/11/2018 with pathology report revealing: tumor size of 1.6 cm; histology of invasive ductal carcinoma; margin status to invasive disease of broadly involved at superior margin; nodal status of negative (0/3); Grade 3. Prognostic panel not repeated (prior triple negative).  She underwent re-excision of the positive margin on 10/23/2018, which showed no residual carcinoma.  She was treated with systemic therapy by Dr. Lindi Adie: CMF x6, 11/15/2018 - 02/28/2019. She is scheduled for port removal on 04/10/2019. She plans to delay this until 3 weeks post RT.  She has an ICD and we've filled out a form to coordinate care with cardiology team - she anticipates this will be turned off during RT.  Symptomatically, the patient reports right arm numbness from her axilla to forearm. There is also slight  swelling to this area.   Pain issues, if any:  She reports knee pain.      ALLERGIES:  is allergic to topamax [topiramate] and zocor [simvastatin - high dose].  Meds: Current Outpatient Medications  Medication Sig Dispense Refill  . albuterol (PROVENTIL HFA;VENTOLIN HFA) 108 (90 Base) MCG/ACT inhaler Inhale 1 puff into the lungs every 6 (six) hours as needed for wheezing or shortness of breath.    . carvedilol (COREG) 25 MG tablet Take 1 tablet (25 mg total) by mouth 2 (two) times daily with a meal. Take 25 mg by mouth 2 (two) times daily with a meal. 60 tablet 5  . cholecalciferol (VITAMIN D3) 25 MCG (1000 UT) tablet Take 1,000 Units by mouth daily.    [name] ENTRESTO 24-26 MG TAKE 1 TABLET BY MOUTH 2 (TWO) TIMES DAILY. 180 tablet 3  . furosemide (LASIX) 40 MG tablet TAKE 1 TABLET BY MOUTH TWO  TIMES DAILY 180 tablet 0  . lidocaine-prilocaine (EMLA) cream Apply to affected area once 30 g 3  . Multiple Vitamin (MULTIVITAMIN WITH MINERALS) TABS tablet Take 1 tablet by mouth daily.    . ondansetron (ZOFRAN) 8 MG tablet TAKE 1 TABLET BY MOUTH 2 TIMES DAILY AS NEEDED FOR REFRACTORY NAUSEA / VOMITING. START ON DAY 3 AFTER CHEMOTHERAPY. 30 tablet 1  . prochlorperazine (COMPAZINE) 10 MG tablet TAKE 1 TABLET (10 MG TOTAL) BY MOUTH EVERY 6 (SIX) HOURS AS NEEDED (NAUSEA OR VOMITING). 30 tablet 1  . rizatriptan (MAXALT) 10 MG tablet Take 10 mg by mouth as  needed for migraine. May repeat in 2 hours if needed    . spironolactone (ALDACTONE) 25 MG tablet Take 0.5 tablets (12.5 mg total) by mouth daily. 45 tablet 3  . LORazepam (ATIVAN) 0.5 MG tablet Take 1 tablet (0.5 mg total) by mouth at bedtime as needed for sleep. (Patient not taking: Reported on 03/15/2019) 30 tablet 0   No current facility-administered medications for this encounter.     Physical Findings:  weight is 224 lb 6.4 oz (101.8 kg). Her temperature is 98.3 F (36.8 C). Her blood pressure is 96/55 (abnormal) and her pulse is 81. Her oxygen  saturation is 100%. .     General: Alert and oriented, in no acute distress Psychiatric: Judgment and insight are intact. Affect is appropriate. Breast exam reveals excellent healing of right axillary and right lower outer quadrant breast surgical scars  Lab Findings: Lab Results  Component Value Date   WBC 4.6 02/28/2019   HGB 10.1 (L) 02/28/2019   HCT 31.6 (L) 02/28/2019   MCV 83.2 02/28/2019   PLT 290 02/28/2019       Radiographic Findings: No results found.  Impression/Plan: Stage IB (pT1c, pN0, cM0) Right Breast LOQ Invasive Ductal Carcinoma, triple negative, grade 3  We discussed adjuvant radiotherapy today.  I recommend four weeks directed at the right breast in order to reduce risk of local regional recurrence by two thirds.  The risks, benefits and side effects of this treatment were discussed in detail.  She understands that radiotherapy is associated with skin irritation and fatigue in the acute setting. Late effects can include cosmetic changes and rare injury to internal organs.  She is enthusiastic about proceeding with treatment. A consent form has been signed and placed in her chart. Simulation to follow this morning.  Anticipate starting treatment on September 14.  I spent 10 minutes face to face with the patient and more than 50% of that time was spent in counseling and/or coordination of care. _____________________________________   Eppie Gibson, MD   This document serves as a record of services personally performed by Eppie Gibson, MD. It was created on her behalf by Wilburn Mylar, a trained medical scribe. The creation of this record is based on the scribe's personal observations and the provider's statements to them. This document has been checked and approved by the attending provider.

## 2019-03-15 ENCOUNTER — Ambulatory Visit
Admission: RE | Admit: 2019-03-15 | Discharge: 2019-03-15 | Disposition: A | Discharge status: Home / Self Care | Payer: Medicare Other | Source: Ambulatory Visit | Attending: Radiation Oncology | Admitting: Radiation Oncology

## 2019-03-15 ENCOUNTER — Telehealth: Payer: Self-pay | Admitting: Hematology and Oncology

## 2019-03-15 ENCOUNTER — Encounter: Payer: Self-pay | Admitting: Radiation Oncology

## 2019-03-15 ENCOUNTER — Other Ambulatory Visit: Payer: Self-pay

## 2019-03-15 ENCOUNTER — Encounter: Payer: Self-pay | Admitting: *Deleted

## 2019-03-15 VITALS — BP 96/55 | HR 81 | Temp 98.3°F | Wt 224.4 lb

## 2019-03-15 DIAGNOSIS — Z510 Encounter for antineoplastic radiation therapy: Secondary | ICD-10-CM | POA: Insufficient documentation

## 2019-03-15 DIAGNOSIS — Z171 Estrogen receptor negative status [ER-]: Secondary | ICD-10-CM | POA: Diagnosis not present

## 2019-03-15 DIAGNOSIS — C50511 Malignant neoplasm of lower-outer quadrant of right female breast: Secondary | ICD-10-CM | POA: Insufficient documentation

## 2019-03-15 DIAGNOSIS — M25569 Pain in unspecified knee: Secondary | ICD-10-CM | POA: Diagnosis not present

## 2019-03-15 DIAGNOSIS — Z79899 Other long term (current) drug therapy: Secondary | ICD-10-CM | POA: Insufficient documentation

## 2019-03-15 DIAGNOSIS — R200 Anesthesia of skin: Secondary | ICD-10-CM | POA: Insufficient documentation

## 2019-03-15 DIAGNOSIS — Z9889 Other specified postprocedural states: Secondary | ICD-10-CM | POA: Diagnosis not present

## 2019-03-15 DIAGNOSIS — Z9221 Personal history of antineoplastic chemotherapy: Secondary | ICD-10-CM | POA: Diagnosis not present

## 2019-03-15 NOTE — Progress Notes (Signed)
  Radiation Oncology         [phone] ________________________________  Name: BRIGHTEN BUZZELLI MRN: [id number]  Date: 03/15/2019  DOB: 07/24/1940  SIMULATION AND TREATMENT PLANNING NOTE    Outpatient  DIAGNOSIS:     ICD-10-CM   1. Malignant neoplasm of lower-outer quadrant of right breast of female, estrogen receptor negative ([place])  C50.511    Z17.1     NARRATIVE:  The patient was brought to the [facility].  Identity was confirmed.  All relevant records and images related to the planned course of therapy were reviewed.  The patient freely provided informed written consent to proceed with treatment after reviewing the details related to the planned course of therapy. The consent form was witnessed and verified by the simulation staff.    Then, the patient was set-up in a stable reproducible supine position for radiation therapy with her ipsilateral arm over her head, and her upper body secured in a custom-made Vac-lok device.  CT images were obtained.  Surface markings were placed.  The CT images were loaded into the planning software.    TREATMENT PLANNING NOTE: Treatment planning then occurred.  The radiation prescription was entered and confirmed.     A total of 3 medically necessary complex treatment devices were fabricated and supervised by me: 2 fields with MLCs for custom blocks to protect heart, and lungs;  and, a Vac-lok. MORE COMPLEX DEVICES MAY BE MADE IN DOSIMETRY FOR FIELD IN FIELD BEAMS FOR DOSE HOMOGENEITY.  I have requested : 3D Simulation which is medically necessary to give adequate dose to at risk tissues while sparing lungs and heart.  I have requested a DVH of the following structures: lungs, heart, right lumpectomy cavity.    The patient will receive 40.05 Gy in 15 fractions to the right breast with 2 tangential fields.   This will be followed by a boost.  Optical Surface Tracking Plan:  Since intensity modulated radiotherapy (IMRT) and 3D conformal  radiation treatment methods are predicated on accurate and precise positioning for treatment, intrafraction motion monitoring is medically necessary to ensure accurate and safe treatment delivery. The ability to quantify intrafraction motion without excessive ionizing radiation dose can only be performed with optical surface tracking. Accordingly, surface imaging offers the opportunity to obtain 3D measurements of patient position throughout IMRT and 3D treatments without excessive radiation exposure. I am ordering optical surface tracking for this patient's upcoming course of radiotherapy.  ________________________________   Reference:  Ursula Alert, J, et al. Surface imaging-based analysis of intrafraction motion for breast radiotherapy patients.Journal of [place], n. 6, nov. 2014. ISSN [id number].  Available at: <http://www.jacmp.org/index.php/jacmp/article/view/4957>.    -----------------------------------  Eppie Gibson, MD

## 2019-03-15 NOTE — Telephone Encounter (Signed)
Scheduled appt per 9/4 sch message - mailed letter with appt date and time

## 2019-03-22 DIAGNOSIS — Z510 Encounter for antineoplastic radiation therapy: Secondary | ICD-10-CM | POA: Diagnosis not present

## 2019-03-22 DIAGNOSIS — Z171 Estrogen receptor negative status [ER-]: Secondary | ICD-10-CM | POA: Diagnosis not present

## 2019-03-22 DIAGNOSIS — C50511 Malignant neoplasm of lower-outer quadrant of right female breast: Secondary | ICD-10-CM | POA: Diagnosis not present

## 2019-03-25 ENCOUNTER — Ambulatory Visit
Admission: RE | Admit: 2019-03-25 | Discharge: 2019-03-25 | Disposition: A | Discharge status: Home / Self Care | Payer: Medicare Other | Source: Ambulatory Visit | Attending: Radiation Oncology | Admitting: Radiation Oncology

## 2019-03-25 ENCOUNTER — Other Ambulatory Visit: Payer: Self-pay

## 2019-03-25 DIAGNOSIS — Z510 Encounter for antineoplastic radiation therapy: Secondary | ICD-10-CM | POA: Diagnosis not present

## 2019-03-25 DIAGNOSIS — Z171 Estrogen receptor negative status [ER-]: Secondary | ICD-10-CM

## 2019-03-25 DIAGNOSIS — C50511 Malignant neoplasm of lower-outer quadrant of right female breast: Secondary | ICD-10-CM

## 2019-03-25 MED ORDER — RADIAPLEXRX EX GEL
Freq: Two times a day (BID) | CUTANEOUS | Status: DC
  Administered 2019-03-25: 16:00:00 via TOPICAL

## 2019-03-25 MED ORDER — ALRA NON-METALLIC DEODORANT (RAD-ONC)
1.0000 "application " | Freq: Once | TOPICAL | Status: AC
  Administered 2019-03-25: 1 via TOPICAL

## 2019-03-26 ENCOUNTER — Ambulatory Visit
Admission: RE | Admit: 2019-03-26 | Discharge: 2019-03-26 | Disposition: A | Discharge status: Home / Self Care | Payer: Medicare Other | Source: Ambulatory Visit | Attending: Radiation Oncology | Admitting: Radiation Oncology

## 2019-03-26 DIAGNOSIS — Z510 Encounter for antineoplastic radiation therapy: Secondary | ICD-10-CM | POA: Diagnosis not present

## 2019-03-26 DIAGNOSIS — C50511 Malignant neoplasm of lower-outer quadrant of right female breast: Secondary | ICD-10-CM | POA: Diagnosis not present

## 2019-03-26 DIAGNOSIS — Z171 Estrogen receptor negative status [ER-]: Secondary | ICD-10-CM | POA: Diagnosis not present

## 2019-03-27 ENCOUNTER — Other Ambulatory Visit: Payer: Self-pay

## 2019-03-27 ENCOUNTER — Ambulatory Visit
Admission: RE | Admit: 2019-03-27 | Discharge: 2019-03-27 | Disposition: A | Discharge status: Home / Self Care | Payer: Medicare Other | Source: Ambulatory Visit | Attending: Radiation Oncology | Admitting: Radiation Oncology

## 2019-03-27 DIAGNOSIS — Z510 Encounter for antineoplastic radiation therapy: Secondary | ICD-10-CM | POA: Diagnosis not present

## 2019-03-27 DIAGNOSIS — Z171 Estrogen receptor negative status [ER-]: Secondary | ICD-10-CM | POA: Diagnosis not present

## 2019-03-27 DIAGNOSIS — C50511 Malignant neoplasm of lower-outer quadrant of right female breast: Secondary | ICD-10-CM | POA: Diagnosis not present

## 2019-03-28 ENCOUNTER — Ambulatory Visit
Admission: RE | Admit: 2019-03-28 | Discharge: 2019-03-28 | Disposition: A | Discharge status: Home / Self Care | Payer: Medicare Other | Source: Ambulatory Visit | Attending: Radiation Oncology | Admitting: Radiation Oncology

## 2019-03-28 ENCOUNTER — Other Ambulatory Visit: Payer: Self-pay

## 2019-03-28 DIAGNOSIS — Z510 Encounter for antineoplastic radiation therapy: Secondary | ICD-10-CM | POA: Diagnosis not present

## 2019-03-28 DIAGNOSIS — C50511 Malignant neoplasm of lower-outer quadrant of right female breast: Secondary | ICD-10-CM | POA: Diagnosis not present

## 2019-03-28 DIAGNOSIS — Z171 Estrogen receptor negative status [ER-]: Secondary | ICD-10-CM | POA: Diagnosis not present

## 2019-03-29 ENCOUNTER — Ambulatory Visit
Admission: RE | Admit: 2019-03-29 | Discharge: 2019-03-29 | Disposition: A | Discharge status: Home / Self Care | Payer: Medicare Other | Source: Ambulatory Visit | Attending: Radiation Oncology | Admitting: Radiation Oncology

## 2019-03-29 ENCOUNTER — Other Ambulatory Visit: Payer: Self-pay

## 2019-03-29 DIAGNOSIS — Z510 Encounter for antineoplastic radiation therapy: Secondary | ICD-10-CM | POA: Diagnosis not present

## 2019-03-29 DIAGNOSIS — Z171 Estrogen receptor negative status [ER-]: Secondary | ICD-10-CM | POA: Diagnosis not present

## 2019-03-29 DIAGNOSIS — C50511 Malignant neoplasm of lower-outer quadrant of right female breast: Secondary | ICD-10-CM | POA: Diagnosis not present

## 2019-04-01 ENCOUNTER — Other Ambulatory Visit: Payer: Self-pay

## 2019-04-01 ENCOUNTER — Ambulatory Visit
Admission: RE | Admit: 2019-04-01 | Discharge: 2019-04-01 | Disposition: A | Discharge status: Home / Self Care | Payer: Medicare Other | Source: Ambulatory Visit | Attending: Radiation Oncology | Admitting: Radiation Oncology

## 2019-04-01 DIAGNOSIS — C50511 Malignant neoplasm of lower-outer quadrant of right female breast: Secondary | ICD-10-CM | POA: Diagnosis not present

## 2019-04-01 DIAGNOSIS — Z171 Estrogen receptor negative status [ER-]: Secondary | ICD-10-CM | POA: Diagnosis not present

## 2019-04-01 DIAGNOSIS — Z510 Encounter for antineoplastic radiation therapy: Secondary | ICD-10-CM | POA: Diagnosis not present

## 2019-04-02 ENCOUNTER — Other Ambulatory Visit: Payer: Self-pay

## 2019-04-02 ENCOUNTER — Ambulatory Visit
Admission: RE | Admit: 2019-04-02 | Discharge: 2019-04-02 | Disposition: A | Discharge status: Home / Self Care | Payer: Medicare Other | Source: Ambulatory Visit | Attending: Radiation Oncology | Admitting: Radiation Oncology

## 2019-04-02 DIAGNOSIS — Z171 Estrogen receptor negative status [ER-]: Secondary | ICD-10-CM | POA: Diagnosis not present

## 2019-04-02 DIAGNOSIS — C50511 Malignant neoplasm of lower-outer quadrant of right female breast: Secondary | ICD-10-CM | POA: Diagnosis not present

## 2019-04-02 DIAGNOSIS — Z510 Encounter for antineoplastic radiation therapy: Secondary | ICD-10-CM | POA: Diagnosis not present

## 2019-04-03 ENCOUNTER — Ambulatory Visit
Admission: RE | Admit: 2019-04-03 | Discharge: 2019-04-03 | Disposition: A | Discharge status: Home / Self Care | Payer: Medicare Other | Source: Ambulatory Visit | Attending: Radiation Oncology | Admitting: Radiation Oncology

## 2019-04-03 DIAGNOSIS — Z510 Encounter for antineoplastic radiation therapy: Secondary | ICD-10-CM | POA: Diagnosis not present

## 2019-04-03 DIAGNOSIS — C50511 Malignant neoplasm of lower-outer quadrant of right female breast: Secondary | ICD-10-CM | POA: Diagnosis not present

## 2019-04-03 DIAGNOSIS — Z171 Estrogen receptor negative status [ER-]: Secondary | ICD-10-CM | POA: Diagnosis not present

## 2019-04-04 ENCOUNTER — Other Ambulatory Visit: Payer: Self-pay

## 2019-04-04 ENCOUNTER — Ambulatory Visit
Admission: RE | Admit: 2019-04-04 | Discharge: 2019-04-04 | Disposition: A | Discharge status: Home / Self Care | Payer: Medicare Other | Source: Ambulatory Visit | Attending: Radiation Oncology | Admitting: Radiation Oncology

## 2019-04-04 DIAGNOSIS — Z171 Estrogen receptor negative status [ER-]: Secondary | ICD-10-CM | POA: Diagnosis not present

## 2019-04-04 DIAGNOSIS — C50511 Malignant neoplasm of lower-outer quadrant of right female breast: Secondary | ICD-10-CM | POA: Diagnosis not present

## 2019-04-04 DIAGNOSIS — Z510 Encounter for antineoplastic radiation therapy: Secondary | ICD-10-CM | POA: Diagnosis not present

## 2019-04-05 ENCOUNTER — Encounter: Payer: Self-pay | Admitting: *Deleted

## 2019-04-05 ENCOUNTER — Ambulatory Visit
Admission: RE | Admit: 2019-04-05 | Discharge: 2019-04-05 | Disposition: A | Discharge status: Home / Self Care | Payer: Medicare Other | Source: Ambulatory Visit | Attending: Radiation Oncology | Admitting: Radiation Oncology

## 2019-04-05 DIAGNOSIS — Z510 Encounter for antineoplastic radiation therapy: Secondary | ICD-10-CM | POA: Diagnosis not present

## 2019-04-05 DIAGNOSIS — Z171 Estrogen receptor negative status [ER-]: Secondary | ICD-10-CM | POA: Diagnosis not present

## 2019-04-05 DIAGNOSIS — C50511 Malignant neoplasm of lower-outer quadrant of right female breast: Secondary | ICD-10-CM | POA: Diagnosis not present

## 2019-04-06 ENCOUNTER — Other Ambulatory Visit (HOSPITAL_COMMUNITY): Payer: Medicare Other

## 2019-04-08 ENCOUNTER — Other Ambulatory Visit: Payer: Self-pay

## 2019-04-08 ENCOUNTER — Ambulatory Visit
Admission: RE | Admit: 2019-04-08 | Discharge: 2019-04-08 | Disposition: A | Discharge status: Home / Self Care | Payer: Medicare Other | Source: Ambulatory Visit | Attending: Radiation Oncology | Admitting: Radiation Oncology

## 2019-04-08 DIAGNOSIS — Z510 Encounter for antineoplastic radiation therapy: Secondary | ICD-10-CM | POA: Diagnosis not present

## 2019-04-08 DIAGNOSIS — Z171 Estrogen receptor negative status [ER-]: Secondary | ICD-10-CM | POA: Diagnosis not present

## 2019-04-08 DIAGNOSIS — C50511 Malignant neoplasm of lower-outer quadrant of right female breast: Secondary | ICD-10-CM | POA: Diagnosis not present

## 2019-04-09 ENCOUNTER — Ambulatory Visit
Admission: RE | Admit: 2019-04-09 | Discharge: 2019-04-09 | Disposition: A | Discharge status: Home / Self Care | Payer: Medicare Other | Source: Ambulatory Visit | Attending: Radiation Oncology | Admitting: Radiation Oncology

## 2019-04-09 DIAGNOSIS — C50511 Malignant neoplasm of lower-outer quadrant of right female breast: Secondary | ICD-10-CM | POA: Diagnosis not present

## 2019-04-09 DIAGNOSIS — Z171 Estrogen receptor negative status [ER-]: Secondary | ICD-10-CM | POA: Diagnosis not present

## 2019-04-09 DIAGNOSIS — Z510 Encounter for antineoplastic radiation therapy: Secondary | ICD-10-CM | POA: Diagnosis not present

## 2019-04-10 ENCOUNTER — Ambulatory Visit
Admission: RE | Admit: 2019-04-10 | Discharge: 2019-04-10 | Disposition: A | Discharge status: Home / Self Care | Payer: Medicare Other | Source: Ambulatory Visit | Attending: Radiation Oncology | Admitting: Radiation Oncology

## 2019-04-10 ENCOUNTER — Other Ambulatory Visit: Payer: Self-pay

## 2019-04-10 DIAGNOSIS — Z171 Estrogen receptor negative status [ER-]: Secondary | ICD-10-CM | POA: Diagnosis not present

## 2019-04-10 DIAGNOSIS — Z510 Encounter for antineoplastic radiation therapy: Secondary | ICD-10-CM | POA: Diagnosis not present

## 2019-04-10 DIAGNOSIS — C50511 Malignant neoplasm of lower-outer quadrant of right female breast: Secondary | ICD-10-CM | POA: Diagnosis not present

## 2019-04-11 ENCOUNTER — Ambulatory Visit
Admission: RE | Admit: 2019-04-11 | Discharge: 2019-04-11 | Disposition: A | Discharge status: Home / Self Care | Payer: Medicare Other | Source: Ambulatory Visit | Attending: Radiation Oncology | Admitting: Radiation Oncology

## 2019-04-11 ENCOUNTER — Other Ambulatory Visit: Payer: Self-pay

## 2019-04-11 DIAGNOSIS — C50511 Malignant neoplasm of lower-outer quadrant of right female breast: Secondary | ICD-10-CM | POA: Diagnosis not present

## 2019-04-11 DIAGNOSIS — Z510 Encounter for antineoplastic radiation therapy: Secondary | ICD-10-CM | POA: Insufficient documentation

## 2019-04-11 DIAGNOSIS — Z171 Estrogen receptor negative status [ER-]: Secondary | ICD-10-CM | POA: Insufficient documentation

## 2019-04-12 ENCOUNTER — Other Ambulatory Visit: Payer: Self-pay

## 2019-04-12 ENCOUNTER — Ambulatory Visit
Admission: RE | Admit: 2019-04-12 | Discharge: 2019-04-12 | Disposition: A | Discharge status: Home / Self Care | Payer: Medicare Other | Source: Ambulatory Visit | Attending: Radiation Oncology | Admitting: Radiation Oncology

## 2019-04-12 DIAGNOSIS — Z171 Estrogen receptor negative status [ER-]: Secondary | ICD-10-CM | POA: Diagnosis not present

## 2019-04-12 DIAGNOSIS — C50511 Malignant neoplasm of lower-outer quadrant of right female breast: Secondary | ICD-10-CM | POA: Diagnosis not present

## 2019-04-12 DIAGNOSIS — Z510 Encounter for antineoplastic radiation therapy: Secondary | ICD-10-CM | POA: Diagnosis not present

## 2019-04-15 ENCOUNTER — Inpatient Hospital Stay: Payer: Medicare Other | Attending: Hematology and Oncology | Admitting: Hematology and Oncology

## 2019-04-15 ENCOUNTER — Ambulatory Visit
Admission: RE | Admit: 2019-04-15 | Discharge: 2019-04-15 | Disposition: A | Discharge status: Home / Self Care | Payer: Medicare Other | Source: Ambulatory Visit | Attending: Radiation Oncology | Admitting: Radiation Oncology

## 2019-04-15 DIAGNOSIS — Z171 Estrogen receptor negative status [ER-]: Secondary | ICD-10-CM | POA: Diagnosis not present

## 2019-04-15 DIAGNOSIS — C50511 Malignant neoplasm of lower-outer quadrant of right female breast: Secondary | ICD-10-CM

## 2019-04-15 DIAGNOSIS — Z510 Encounter for antineoplastic radiation therapy: Secondary | ICD-10-CM | POA: Diagnosis not present

## 2019-04-15 NOTE — Assessment & Plan Note (Deleted)
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%, T1CN0 stage Ib  Review: Superior marginwas broadly positive: Patient had surgery 10/23/2018: Benign Peripheral blood flow cytometry: Monoclonal B-cell population kappa restricted lymphocytes CD20 positive but lacks CD5, CD10, CD23 or CD103: B-cell lymphoproliferative process  Treatment plan: 1.Adjuvant chemotherapy with CMF x6 cycles  11/15/2018-  02/28/2019 2.Followed by adjuvant radiation 03/26/2019- ----------------------------------------------------------------------------------------------------------------------------------------------------- With completion of radiation therapy she completes her systemic treatment. Follow-up in 3 months for survivorship care plan visit.

## 2019-04-16 ENCOUNTER — Ambulatory Visit
Admission: RE | Admit: 2019-04-16 | Discharge: 2019-04-16 | Disposition: A | Discharge status: Home / Self Care | Payer: Medicare Other | Source: Ambulatory Visit | Attending: Radiation Oncology | Admitting: Radiation Oncology

## 2019-04-16 ENCOUNTER — Other Ambulatory Visit: Payer: Self-pay

## 2019-04-16 DIAGNOSIS — Z171 Estrogen receptor negative status [ER-]: Secondary | ICD-10-CM | POA: Diagnosis not present

## 2019-04-16 DIAGNOSIS — Z510 Encounter for antineoplastic radiation therapy: Secondary | ICD-10-CM | POA: Diagnosis not present

## 2019-04-16 DIAGNOSIS — C50511 Malignant neoplasm of lower-outer quadrant of right female breast: Secondary | ICD-10-CM | POA: Diagnosis not present

## 2019-04-17 ENCOUNTER — Ambulatory Visit
Admission: RE | Admit: 2019-04-17 | Discharge: 2019-04-17 | Disposition: A | Discharge status: Home / Self Care | Payer: Medicare Other | Source: Ambulatory Visit | Attending: Radiation Oncology | Admitting: Radiation Oncology

## 2019-04-17 DIAGNOSIS — Z171 Estrogen receptor negative status [ER-]: Secondary | ICD-10-CM | POA: Diagnosis not present

## 2019-04-17 DIAGNOSIS — C50511 Malignant neoplasm of lower-outer quadrant of right female breast: Secondary | ICD-10-CM | POA: Diagnosis not present

## 2019-04-17 DIAGNOSIS — Z510 Encounter for antineoplastic radiation therapy: Secondary | ICD-10-CM | POA: Diagnosis not present

## 2019-04-18 ENCOUNTER — Ambulatory Visit
Admission: RE | Admit: 2019-04-18 | Discharge: 2019-04-18 | Disposition: A | Discharge status: Home / Self Care | Payer: Medicare Other | Source: Ambulatory Visit | Attending: Radiation Oncology | Admitting: Radiation Oncology

## 2019-04-18 DIAGNOSIS — C50511 Malignant neoplasm of lower-outer quadrant of right female breast: Secondary | ICD-10-CM | POA: Diagnosis not present

## 2019-04-18 DIAGNOSIS — Z171 Estrogen receptor negative status [ER-]: Secondary | ICD-10-CM | POA: Diagnosis not present

## 2019-04-18 DIAGNOSIS — Z510 Encounter for antineoplastic radiation therapy: Secondary | ICD-10-CM | POA: Diagnosis not present

## 2019-04-19 ENCOUNTER — Other Ambulatory Visit: Payer: Self-pay

## 2019-04-19 ENCOUNTER — Encounter: Payer: Self-pay | Admitting: *Deleted

## 2019-04-19 ENCOUNTER — Encounter: Payer: Self-pay | Admitting: Radiation Oncology

## 2019-04-19 ENCOUNTER — Ambulatory Visit
Admission: RE | Admit: 2019-04-19 | Discharge: 2019-04-19 | Disposition: A | Discharge status: Home / Self Care | Payer: Medicare Other | Source: Ambulatory Visit | Attending: Radiation Oncology | Admitting: Radiation Oncology

## 2019-04-19 DIAGNOSIS — Z510 Encounter for antineoplastic radiation therapy: Secondary | ICD-10-CM | POA: Diagnosis not present

## 2019-04-19 DIAGNOSIS — Z171 Estrogen receptor negative status [ER-]: Secondary | ICD-10-CM | POA: Diagnosis not present

## 2019-04-19 DIAGNOSIS — C50511 Malignant neoplasm of lower-outer quadrant of right female breast: Secondary | ICD-10-CM | POA: Diagnosis not present

## 2019-05-02 NOTE — Pre-Procedure Instructions (Signed)
[place], [place] [facility] [address] [place] [address] Phone: [phone] Fax: [phone]  [id number] [place], [address] [place] [address] Phone: [phone] Fax: [phone]      Your procedure is scheduled on  05-08-19 Wednesday  Report to [facility] Main Entrance "A" at 7 A.M., and check in at the Admitting office.  Call this number if you have problems the morning of surgery:  [phone]  Call [phone] if you have any questions prior to your surgery date Monday-Friday 8am-4pm    Remember:  Do not eat  after midnight the night before your surgery  You may drink clear liquids until 6AMthe morning of your surgery.   Clear liquids allowed are: Water, Non-Citrus Juices (without pulp), Carbonated Beverages, Clear Tea, Black Coffee Only, and Gatorade   Take these medicines the morning of surgery with A SIP OF WATER : carvedilol (COREG)  As needed-Albuterol(bring it with you), Compazine,Maxalt  7 days prior to surgery STOP taking any Aspirin (unless otherwise instructed by your surgeon), Aleve, Naproxen, Ibuprofen, Motrin, Advil, Goody's, BC's, all herbal medications, fish oil, and all vitamins.    The Morning of Surgery  Do not wear jewelry, make-up or nail polish.  Do not wear lotions, powders, or perfumes, or deodorant  Do not shave 48 hours prior to surgery.    Do not bring valuables to the hospital.  [facility] is not responsible for any belongings or valuables.  If you are a smoker, DO NOT Smoke 24 hours prior to surgery IF you wear a CPAP at night please bring your mask, tubing, and machine the morning of surgery   Remember that you must have someone to transport you home after your surgery, and remain with you for 24 hours if you are discharged the same day.   Contacts, glasses, hearing aids, dentures or bridgework may not be worn into surgery.     Leave your suitcase in the car.  After surgery it may be brought to your room.  For patients admitted to the hospital, discharge time will be determined by your treatment team.  Patients discharged the day of surgery will not be allowed to drive home.    Special instructions:   [facility]- Preparing For Surgery  Before surgery, you can play an important role. Because skin is not sterile, your skin needs to be as free of germs as possible. You can reduce the number of germs on your skin by washing with CHG (chlorahexidine gluconate) Soap before surgery.  CHG is an antiseptic cleaner which kills germs and bonds with the skin to continue killing germs even after washing.    Oral Hygiene is also important to reduce your risk of infection.  Remember - BRUSH YOUR TEETH THE MORNING OF SURGERY WITH YOUR REGULAR TOOTHPASTE  Please do not use if you have an allergy to CHG or antibacterial soaps. If your skin becomes reddened/irritated stop using the CHG.  Do not shave (including legs and underarms) for at least 48 hours prior to first CHG shower. It is OK to shave your face.  Please follow these instructions carefully.   1. Shower the NIGHT BEFORE SURGERY and the MORNING OF SURGERY with CHG Soap.   2. If you chose to wash your hair, wash your hair first as usual with your normal shampoo.  3. After you shampoo, rinse your hair and body thoroughly to remove the  shampoo.  4. Use CHG as you would any other liquid soap. You can apply CHG directly to the skin and wash gently with a scrungie or a clean washcloth.   5. Apply the CHG Soap to your body ONLY FROM THE NECK DOWN.  Do not use on open wounds or open sores. Avoid contact with your eyes, ears, mouth and genitals (private parts). Wash Face and genitals (private parts)  with your normal soap.   6. Wash thoroughly, paying special attention to the area where your surgery will be performed.  7. Thoroughly rinse your body with warm water from  the neck down.  8. DO NOT shower/wash with your normal soap after using and rinsing off the CHG Soap.  9. Pat yourself dry with a CLEAN TOWEL.  10. Wear CLEAN PAJAMAS to bed the night before surgery, wear comfortable clothes the morning of surgery  11. Place CLEAN SHEETS on your bed the night of your first shower and DO NOT SLEEP WITH PETS.  Day of Surgery:  Do not apply any deodorants/lotions. Please shower the morning of surgery with the CHG soap  Please wear clean clothes to the hospital/surgery center.   Remember to brush your teeth WITH YOUR REGULAR TOOTHPASTE.   Please read over the  fact sheets that you were given.

## 2019-05-03 ENCOUNTER — Encounter (HOSPITAL_COMMUNITY)
Admission: RE | Admit: 2019-05-03 | Discharge: 2019-05-03 | Disposition: A | Discharge status: Home / Self Care | Payer: Medicare Other | Source: Ambulatory Visit | Attending: General Surgery | Admitting: General Surgery

## 2019-05-03 ENCOUNTER — Encounter (HOSPITAL_COMMUNITY): Payer: Self-pay

## 2019-05-03 ENCOUNTER — Other Ambulatory Visit: Payer: Self-pay

## 2019-05-03 DIAGNOSIS — Z95810 Presence of automatic (implantable) cardiac defibrillator: Secondary | ICD-10-CM | POA: Diagnosis not present

## 2019-05-03 DIAGNOSIS — I428 Other cardiomyopathies: Secondary | ICD-10-CM | POA: Diagnosis not present

## 2019-05-03 DIAGNOSIS — Z01818 Encounter for other preprocedural examination: Secondary | ICD-10-CM | POA: Diagnosis not present

## 2019-05-03 LAB — BASIC METABOLIC PANEL
Anion gap: 10 (ref 5–15)
BUN: 21 mg/dL (ref 8–23)
CO2: 25 mmol/L (ref 22–32)
Calcium: 9.5 mg/dL (ref 8.9–10.3)
Chloride: 106 mmol/L (ref 98–111)
Creatinine, Ser: 1.55 mg/dL — ABNORMAL HIGH (ref 0.44–1.00)
GFR calc Af Amer: 37 mL/min — ABNORMAL LOW (ref >60)
GFR calc non Af Amer: 32 mL/min — ABNORMAL LOW (ref >60)
Glucose, Bld: 102 mg/dL — ABNORMAL HIGH (ref 70–99)
Potassium: 4.6 mmol/L (ref 3.5–5.1)
Sodium: 141 mmol/L (ref 135–145)

## 2019-05-03 LAB — CBC
HCT: 34.7 % — ABNORMAL LOW (ref 36.0–46.0)
Hemoglobin: 10.7 g/dL — ABNORMAL LOW (ref 12.0–15.0)
MCH: 26.3 pg (ref 26.0–34.0)
MCHC: 30.8 g/dL (ref 30.0–36.0)
MCV: 85.3 fL (ref 80.0–100.0)
Platelets: 182 10*3/uL (ref 150–400)
RBC: 4.07 MIL/uL (ref 3.87–5.11)
RDW: 15.7 % — ABNORMAL HIGH (ref 11.5–15.5)
WBC: 3.9 10*3/uL — ABNORMAL LOW (ref 4.0–10.5)
nRBC: 0 % (ref 0.0–0.2)

## 2019-05-03 NOTE — Progress Notes (Signed)
Pt scheduled for Covid test on 10/24   No sx of Corona virus. No h/o exposure to Covid positive pts. No h/o recent travel.  PCP -Dr Cathlean Cower   Cardiologist - Dr Stanford Breed  Device Manager-Dr [name]  Chest x-ray - NA  EKG - 09-05-18  Stress Test -10/27/16   ECHO --09/14/18   Cardiac Cath - 2004  AICD-[employer]. Email sent to rep. PM-denies LOOP-denies  Sleep Study -Denies  CPAP -   LABS-CBC,BMP  ASA-denies  ERAS-clear liquids  HA1C-denies Fasting Blood Sugar -  Checks Blood Sugar _____ times a day  Anesthesia-Y. ICD.   Pt denies having chest pain, sob, or fever at this time. All instructions explained to the pt, with a verbal understanding of the material. Pt agrees to go over the instructions while at home for a better understanding. Pt also instructed to self quarantine after being tested for COVID-19. The opportunity to ask questions was provided.

## 2019-05-04 ENCOUNTER — Other Ambulatory Visit (HOSPITAL_COMMUNITY)
Admission: RE | Admit: 2019-05-04 | Discharge: 2019-05-04 | Disposition: A | Discharge status: Home / Self Care | Payer: Medicare Other | Source: Ambulatory Visit | Attending: General Surgery | Admitting: General Surgery

## 2019-05-04 DIAGNOSIS — Z01812 Encounter for preprocedural laboratory examination: Secondary | ICD-10-CM | POA: Insufficient documentation

## 2019-05-04 DIAGNOSIS — Z20828 Contact with and (suspected) exposure to other viral communicable diseases: Secondary | ICD-10-CM | POA: Insufficient documentation

## 2019-05-05 LAB — NOVEL CORONAVIRUS, NAA (HOSP ORDER, SEND-OUT TO REF LAB; TAT 18-24 HRS): SARS-CoV-2, NAA: NOT DETECTED

## 2019-05-06 NOTE — Anesthesia Preprocedure Evaluation (Addendum)
Anesthesia Evaluation  Patient identified by MRN, date of birth, ID band Patient awake    Reviewed: Allergy & Precautions, NPO status , Patient's Chart, lab work & pertinent test results  Airway Mallampati: II  TM Distance: >3 FB     Dental   Pulmonary    breath sounds clear to auscultation       Cardiovascular hypertension, + Cardiac Defibrillator  Rhythm:Regular Rate:Normal     Neuro/Psych  Headaches, Anxiety  Neuromuscular disease    GI/Hepatic Neg liver ROS, GERD  ,  Endo/Other    Renal/GU negative Renal ROS     Musculoskeletal   Abdominal   Peds  Hematology  (+) anemia ,   Anesthesia Other Findings   Reproductive/Obstetrics                           Anesthesia Physical Anesthesia Plan  ASA: III  Anesthesia Plan: MAC   Post-op Pain Management:    Induction: Intravenous  PONV Risk Score and Plan: 2 and Ondansetron, Dexamethasone and Midazolam  Airway Management Planned: Nasal Cannula and Simple Face Mask  Additional Equipment:   Intra-op Plan:   Post-operative Plan:   Informed Consent: I have reviewed the patients History and Physical, chart, labs and discussed the procedure including the risks, benefits and alternatives for the proposed anesthesia with the patient or authorized representative who has indicated his/her understanding and acceptance.     Dental advisory given  Plan Discussed with: CRNA and Anesthesiologist  Anesthesia Plan Comments: (Follows yearly with Dr. Stanford Breed (last seen 05/07/18) for nonischemic cardiomyopathy. Cardiac catheterization August 2004 showed normal coronary arteries. She had an ICD placed on December 2008. Nuclear study repeated April 2018 and showed ejection fraction 35% with no ischemia or infarction.  Follows with Dr. Caryl Comes for ICD implanted 2008 for primary prevention in the setting of nonischemic heart disease. 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 Last seen by Dr. Caryl Comes 09/05/18. Repeat echo 09/14/18 showed stable EF 20-25%. Last remote 02/04/19 showed normal function.  Review of previous labs shows stable CKD. Baseline creatinine ~1.4. Creatinine 1.55 on preop labs. Chronic anemia, Hgb 10.7  TTE 09/14/18:   1. The left ventricle has severely reduced systolic function, with an ejection fraction of 20-25%. The cavity size was severely dilated. Left ventricular diastolic Doppler parameters are consistent with pseudonormalization.  2. The right ventricle has normal systolic function. The cavity was normal. There is no increase in right ventricular wall thickness.  3. Left atrial size was mildly dilated.  4. The mitral valve is normal in structure. Mitral valve regurgitation is mild to moderate by color flow Doppler.  5. The tricuspid valve is normal in structure.  6. The aortic valve is tricuspid Mild thickening of the aortic valve.  7. The pulmonic valve was grossly normal. Pulmonic valve regurgitation is mild by color flow Doppler.  8. The interatrial septum was not assessed.  Nuclear stress 10/27/16: The left ventricular ejection fraction is moderately decreased (30-44%). Nuclear stress EF: 35%. No change from baseline EKG showing NSR with LVH and repolarization abnormality. The perfusion imaging is normal with no ischemia. This is an intermediate risk study due to LV dysfunction.)     Anesthesia Quick Evaluation

## 2019-05-06 NOTE — Progress Notes (Signed)
Anesthesia Chart Review: Follows yearly with Dr. Stanford Breed (last seen 05/07/18) for nonischemic cardiomyopathy. Cardiac catheterization August 2004 showed normal coronary arteries. She had an ICD placed on December 2008. Nuclear study repeated April 2018 and showed ejection fraction 35% with no ischemia or infarction.  Follows with Dr. Caryl Comes for ICD implanted 2008 for primary prevention in the setting of nonischemic heart disease. 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 Last seen by Dr. Caryl Comes 09/05/18. Repeat echo 09/14/18 showed stable EF 20-25%. Last remote 02/04/19 showed normal function.  Review of previous labs shows stable CKD. Baseline creatinine ~1.4. Creatinine 1.55 on preop labs. Chronic anemia, Hgb 10.7  TTE 09/14/18:   1. The left ventricle has severely reduced systolic function, with an ejection fraction of 20-25%. The cavity size was severely dilated. Left ventricular diastolic Doppler parameters are consistent with pseudonormalization.  2. The right ventricle has normal systolic function. The cavity was normal. There is no increase in right ventricular wall thickness.  3. Left atrial size was mildly dilated.  4. The mitral valve is normal in structure. Mitral valve regurgitation is mild to moderate by color flow Doppler.  5. The tricuspid valve is normal in structure.  6. The aortic valve is tricuspid Mild thickening of the aortic valve.  7. The pulmonic valve was grossly normal. Pulmonic valve regurgitation is mild by color flow Doppler.  8. The interatrial septum was not assessed.  Nuclear stress 10/27/16:  The left ventricular ejection fraction is moderately decreased (30-44%).  Nuclear stress EF: 35%.  No change from baseline EKG showing NSR with LVH and repolarization abnormality.  The perfusion imaging is normal with no ischemia.  This is an intermediate risk study due to LV dysfunction.     Wynonia Musty [facility] Stay Center/Anesthesiology Phone [phone] 05/06/2019 1:46 PM

## 2019-05-07 ENCOUNTER — Ambulatory Visit (INDEPENDENT_AMBULATORY_CARE_PROVIDER_SITE_OTHER): Payer: Medicare Other | Admitting: *Deleted

## 2019-05-07 DIAGNOSIS — I428 Other cardiomyopathies: Secondary | ICD-10-CM

## 2019-05-07 DIAGNOSIS — I5022 Chronic systolic (congestive) heart failure: Secondary | ICD-10-CM

## 2019-05-08 ENCOUNTER — Ambulatory Visit (HOSPITAL_COMMUNITY): Payer: Medicare Other | Admitting: Physician Assistant

## 2019-05-08 ENCOUNTER — Ambulatory Visit (HOSPITAL_COMMUNITY)
Admission: RE | Admit: 2019-05-08 | Discharge: 2019-05-08 | Disposition: A | Discharge status: Home / Self Care | Payer: Medicare Other | Attending: General Surgery | Admitting: General Surgery

## 2019-05-08 ENCOUNTER — Encounter (HOSPITAL_COMMUNITY)
Admission: RE | Disposition: A | Discharge status: Home / Self Care | Payer: Self-pay | Source: Home / Self Care | Attending: General Surgery

## 2019-05-08 ENCOUNTER — Encounter (HOSPITAL_COMMUNITY): Payer: Self-pay | Admitting: *Deleted

## 2019-05-08 ENCOUNTER — Ambulatory Visit (HOSPITAL_COMMUNITY): Payer: Medicare Other | Admitting: Certified Registered"

## 2019-05-08 ENCOUNTER — Other Ambulatory Visit: Payer: Self-pay

## 2019-05-08 DIAGNOSIS — Z8261 Family history of arthritis: Secondary | ICD-10-CM | POA: Diagnosis not present

## 2019-05-08 DIAGNOSIS — K219 Gastro-esophageal reflux disease without esophagitis: Secondary | ICD-10-CM | POA: Insufficient documentation

## 2019-05-08 DIAGNOSIS — I5022 Chronic systolic (congestive) heart failure: Secondary | ICD-10-CM | POA: Diagnosis not present

## 2019-05-08 DIAGNOSIS — Z888 Allergy status to other drugs, medicaments and biological substances status: Secondary | ICD-10-CM | POA: Diagnosis not present

## 2019-05-08 DIAGNOSIS — C50511 Malignant neoplasm of lower-outer quadrant of right female breast: Secondary | ICD-10-CM | POA: Diagnosis not present

## 2019-05-08 DIAGNOSIS — I110 Hypertensive heart disease with heart failure: Secondary | ICD-10-CM | POA: Insufficient documentation

## 2019-05-08 DIAGNOSIS — G709 Myoneural disorder, unspecified: Secondary | ICD-10-CM | POA: Insufficient documentation

## 2019-05-08 DIAGNOSIS — Z841 Family history of disorders of kidney and ureter: Secondary | ICD-10-CM | POA: Diagnosis not present

## 2019-05-08 DIAGNOSIS — R519 Headache, unspecified: Secondary | ICD-10-CM | POA: Diagnosis not present

## 2019-05-08 DIAGNOSIS — Z7951 Long term (current) use of inhaled steroids: Secondary | ICD-10-CM | POA: Diagnosis not present

## 2019-05-08 DIAGNOSIS — Z87442 Personal history of urinary calculi: Secondary | ICD-10-CM | POA: Diagnosis not present

## 2019-05-08 DIAGNOSIS — Z8249 Family history of ischemic heart disease and other diseases of the circulatory system: Secondary | ICD-10-CM | POA: Insufficient documentation

## 2019-05-08 DIAGNOSIS — F419 Anxiety disorder, unspecified: Secondary | ICD-10-CM | POA: Diagnosis not present

## 2019-05-08 DIAGNOSIS — D649 Anemia, unspecified: Secondary | ICD-10-CM | POA: Insufficient documentation

## 2019-05-08 DIAGNOSIS — Z452 Encounter for adjustment and management of vascular access device: Secondary | ICD-10-CM | POA: Diagnosis not present

## 2019-05-08 DIAGNOSIS — Z171 Estrogen receptor negative status [ER-]: Secondary | ICD-10-CM | POA: Diagnosis not present

## 2019-05-08 DIAGNOSIS — Z79899 Other long term (current) drug therapy: Secondary | ICD-10-CM | POA: Diagnosis not present

## 2019-05-08 DIAGNOSIS — Z853 Personal history of malignant neoplasm of breast: Secondary | ICD-10-CM | POA: Insufficient documentation

## 2019-05-08 DIAGNOSIS — E785 Hyperlipidemia, unspecified: Secondary | ICD-10-CM | POA: Insufficient documentation

## 2019-05-08 DIAGNOSIS — Z90710 Acquired absence of both cervix and uterus: Secondary | ICD-10-CM | POA: Diagnosis not present

## 2019-05-08 HISTORY — PX: PORT-A-CATH REMOVAL: SHX5289

## 2019-05-08 LAB — CUP PACEART REMOTE DEVICE CHECK
Battery Remaining Longevity: 122 mo
Battery Voltage: 3.01 V
Brady Statistic RV Percent Paced: 0.01 %
Date Time Interrogation Session: 20201027113826
HighPow Impedance: 39 Ohm
HighPow Impedance: 45 Ohm
Implantable Lead Implant Date: 20081215
Implantable Lead Location: 753860
Implantable Lead Model: 6947
Implantable Pulse Generator Implant Date: 20190418
Lead Channel Impedance Value: 380 Ohm
Lead Channel Impedance Value: 494 Ohm
Lead Channel Pacing Threshold Amplitude: 0.75 V
Lead Channel Pacing Threshold Pulse Width: 0.4 ms
Lead Channel Sensing Intrinsic Amplitude: 24.5 mV
Lead Channel Sensing Intrinsic Amplitude: 24.5 mV
Lead Channel Setting Pacing Amplitude: 2.5 V
Lead Channel Setting Pacing Pulse Width: 0.4 ms
Lead Channel Setting Sensing Sensitivity: 0.3 mV

## 2019-05-08 SURGERY — REMOVAL PORT-A-CATH
Anesthesia: Monitor Anesthesia Care | Site: Chest

## 2019-05-08 MED ORDER — PROPOFOL 10 MG/ML IV BOLUS
INTRAVENOUS | Status: AC
  Filled 2019-05-08: qty 20

## 2019-05-08 MED ORDER — FENTANYL CITRATE (PF) 250 MCG/5ML IJ SOLN
INTRAMUSCULAR | Status: AC
  Filled 2019-05-08: qty 5

## 2019-05-08 MED ORDER — FENTANYL CITRATE (PF) 100 MCG/2ML IJ SOLN
INTRAMUSCULAR | Status: DC | PRN
  Administered 2019-05-08: 50 ug via INTRAVENOUS
  Administered 2019-05-08 (×2): 25 ug via INTRAVENOUS

## 2019-05-08 MED ORDER — ACETAMINOPHEN 500 MG PO TABS
1000.0000 mg | ORAL_TABLET | ORAL | Status: DC
  Filled 2019-05-08: qty 2

## 2019-05-08 MED ORDER — OXYCODONE HCL 5 MG PO TABS: 2.5000 mg | ORAL_TABLET | Freq: Four times a day (QID) | ORAL | 0 refills | Status: DC | PRN

## 2019-05-08 MED ORDER — LIDOCAINE HCL (PF) 1 % IJ SOLN
INTRAMUSCULAR | Status: DC | PRN
  Administered 2019-05-08: 30 mL

## 2019-05-08 MED ORDER — CHLORHEXIDINE GLUCONATE CLOTH 2 % EX PADS: 6.0000 | MEDICATED_PAD | Freq: Once | CUTANEOUS | Status: DC

## 2019-05-08 MED ORDER — GABAPENTIN 100 MG PO CAPS
100.0000 mg | ORAL_CAPSULE | ORAL | Status: AC
  Administered 2019-05-08: 100 mg via ORAL
  Filled 2019-05-08: qty 1

## 2019-05-08 MED ORDER — BUPIVACAINE-EPINEPHRINE 0.5% -1:200000 IJ SOLN
INTRAMUSCULAR | Status: AC
  Filled 2019-05-08: qty 1

## 2019-05-08 MED ORDER — LIDOCAINE HCL (PF) 1 % IJ SOLN
INTRAMUSCULAR | Status: AC
  Filled 2019-05-08: qty 30

## 2019-05-08 MED ORDER — MIDAZOLAM HCL 5 MG/5ML IJ SOLN
INTRAMUSCULAR | Status: DC | PRN
  Administered 2019-05-08 (×2): 1 mg via INTRAVENOUS

## 2019-05-08 MED ORDER — FENTANYL CITRATE (PF) 100 MCG/2ML IJ SOLN: 25.0000 ug | INTRAMUSCULAR | Status: DC | PRN

## 2019-05-08 MED ORDER — BUPIVACAINE-EPINEPHRINE 0.5% -1:200000 IJ SOLN
INTRAMUSCULAR | Status: DC | PRN
  Administered 2019-05-08: 30 mL

## 2019-05-08 MED ORDER — CEFAZOLIN SODIUM-DEXTROSE 2-4 GM/100ML-% IV SOLN
2.0000 g | INTRAVENOUS | Status: AC
  Administered 2019-05-08: 2 g via INTRAVENOUS
  Filled 2019-05-08: qty 100

## 2019-05-08 MED ORDER — LACTATED RINGERS IV SOLN
INTRAVENOUS | Status: DC
  Administered 2019-05-08 (×2): via INTRAVENOUS

## 2019-05-08 MED ORDER — MIDAZOLAM HCL 2 MG/2ML IJ SOLN
INTRAMUSCULAR | Status: AC
  Filled 2019-05-08: qty 2

## 2019-05-08 SURGICAL SUPPLY — 30 items
ADH SKN CLS APL DERMABOND .7 (GAUZE/BANDAGES/DRESSINGS) ×1
CHLORAPREP W/TINT 10.5 ML (MISCELLANEOUS) ×3 IMPLANT
COVER SURGICAL LIGHT HANDLE (MISCELLANEOUS) ×3 IMPLANT
COVER WAND RF STERILE (DRAPES) ×3 IMPLANT
DECANTER SPIKE VIAL GLASS SM (MISCELLANEOUS) ×6 IMPLANT
DERMABOND ADVANCED (GAUZE/BANDAGES/DRESSINGS) ×2
DERMABOND ADVANCED .7 DNX12 (GAUZE/BANDAGES/DRESSINGS) ×1 IMPLANT
DRAPE LAPAROTOMY 100X72 PEDS (DRAPES) ×3 IMPLANT
ELECT CAUTERY BLADE 6.4 (BLADE) ×3 IMPLANT
ELECT REM PT RETURN 9FT ADLT (ELECTROSURGICAL) ×3
ELECTRODE REM PT RTRN 9FT ADLT (ELECTROSURGICAL) ×1 IMPLANT
GAUZE 4X4 16PLY RFD (DISPOSABLE) ×3 IMPLANT
GLOVE BIO SURGEON STRL SZ 6 (GLOVE) ×3 IMPLANT
GLOVE INDICATOR 6.5 STRL GRN (GLOVE) ×3 IMPLANT
GOWN STRL REUS W/ TWL LRG LVL3 (GOWN DISPOSABLE) ×1 IMPLANT
GOWN STRL REUS W/TWL 2XL LVL3 (GOWN DISPOSABLE) ×3 IMPLANT
GOWN STRL REUS W/TWL LRG LVL3 (GOWN DISPOSABLE) ×3
KIT BASIN OR (CUSTOM PROCEDURE TRAY) ×3 IMPLANT
KIT TURNOVER KIT B (KITS) ×3 IMPLANT
NDL HYPO 25GX1X1/2 BEV (NEEDLE) ×1 IMPLANT
NEEDLE HYPO 25GX1X1/2 BEV (NEEDLE) ×3 IMPLANT
NS IRRIG 1000ML POUR BTL (IV SOLUTION) ×3 IMPLANT
PACK GENERAL/GYN (CUSTOM PROCEDURE TRAY) ×3 IMPLANT
PAD ARMBOARD 7.5X6 YLW CONV (MISCELLANEOUS) ×6 IMPLANT
SUT MON AB 4-0 PC3 18 (SUTURE) ×3 IMPLANT
SUT VIC AB 3-0 SH 27 (SUTURE) ×3
SUT VIC AB 3-0 SH 27X BRD (SUTURE) ×1 IMPLANT
SYR CONTROL 10ML LL (SYRINGE) ×3 IMPLANT
TOWEL GREEN STERILE (TOWEL DISPOSABLE) ×3 IMPLANT
TOWEL GREEN STERILE FF (TOWEL DISPOSABLE) ×3 IMPLANT

## 2019-05-08 NOTE — Op Note (Signed)
  PRE-OPERATIVE DIAGNOSIS:  un-needed Port-A-Cath for right breast cancer  POST-OPERATIVE DIAGNOSIS:  Same   PROCEDURE:  Procedure(s):  REMOVAL PORT-A-CATH  SURGEON:  Surgeon(s):  Stark Klein, MD  ANESTHESIA:   MAC + local  EBL:   Minimal  SPECIMEN:  None  Complications : none known  Procedure:   Pt was  identified in the holding area and taken to the operating room where she was placed supine on the operating room table.  MAC anesthesia was induced.  The right upper chest was prepped and draped.  The prior incision was anesthetized with local anesthetic.  The incision was opened with a #15 blade.  The subcutaneous tissue was divided with the cautery.  The port was identified and the capsule opened.  The four 2-0 prolene sutures were removed.  The port was then removed and pressure held on the tract.  The catheter appeared intact without evidence of breakage, length was 16 cm.  The wound was inspected for hemostasis, which was achieved with cautery.  The wound was closed with 3-0 vicryl deep dermal interrupted sutures and 4-0 Monocryl running subcuticular suture.  The wound was cleaned, dried, and dressed with dermabond.  The patient was awakened from anesthesia and taken to the PACU in stable condition.  Needle, sponge, and instrument counts are correct.

## 2019-05-08 NOTE — Transfer of Care (Signed)
Immediate Anesthesia Transfer of Care Note  Patient: [name] [name]  Procedure(s) Performed: REMOVAL OF PORT-A-CATH RIGHT CHEST (N/A Chest)  Patient Location: PACU  Anesthesia Type:MAC  Level of Consciousness: awake, alert  and oriented  Airway & Oxygen Therapy: Patient Spontanous Breathing  Post-op Assessment: Report given to RN, Post -op Vital signs reviewed and stable and Patient moving all extremities  Post vital signs: Reviewed and stable  Last Vitals:  Vitals Value Taken Time  BP 110/64 05/08/19 1008  Temp    Pulse 58 05/08/19 1011  Resp 11 05/08/19 1011  SpO2 100 % 05/08/19 1011  Vitals shown include unvalidated device data.  Last Pain:  Vitals:   05/08/19 0733  TempSrc:   PainSc: 0-No pain         Complications: No apparent anesthesia complications

## 2019-05-08 NOTE — Discharge Instructions (Addendum)
[facility] Office Phone Number [phone]   POST OP INSTRUCTIONS  Always review your discharge instruction sheet given to you by the facility where your surgery was performed.  IF YOU HAVE DISABILITY OR FAMILY LEAVE FORMS, YOU MUST BRING THEM TO THE OFFICE FOR PROCESSING.  DO NOT GIVE THEM TO YOUR DOCTOR.  1. A prescription for pain medication may be given to you upon discharge.  Take your pain medication as prescribed, if needed.  If narcotic pain medicine is not needed, then you may take acetaminophen (Tylenol) or ibuprofen (Advil) as needed. 2. Take your usually prescribed medications unless otherwise directed 3. If you need a refill on your pain medication, please contact your pharmacy.  They will contact our office to request authorization.  Prescriptions will not be filled after 5pm or on week-ends. 4. You should eat very light the first 24 hours after surgery, such as soup, crackers, pudding, etc.  Resume your normal diet the day after surgery 5. It is common to experience some constipation if taking pain medication after surgery.  Increasing fluid intake and taking a stool softener will usually help or prevent this problem from occurring.  A mild laxative (Milk of Magnesia or Miralax) should be taken according to package directions if there are no bowel movements after 48 hours. 6. You may shower in 48 hours.  The surgical glue will flake off in 2-3 weeks.   7. ACTIVITIES:  No strenuous activity or heavy lifting for 1 week.   a. You may drive when you no longer are taking prescription pain medication, you can comfortably wear a seatbelt, and you can safely maneuver your car and apply brakes. b. RETURN TO WORK:  __________n/a_______________ You should see your doctor in the office for a follow-up appointment approximately three-four weeks after your surgery.    WHEN TO CALL YOUR DOCTOR: 1. Fever over 101.0 2. Nausea and/or vomiting. 3. Extreme swelling or  bruising. 4. Continued bleeding from incision. 5. Increased pain, redness, or drainage from the incision.  The clinic staff is available to answer your questions during regular business hours.  Please don't hesitate to call and ask to speak to one of the nurses for clinical concerns.  If you have a medical emergency, go to the nearest emergency room or call 911.  A surgeon from [facility] is always on call at the hospital.  For further questions, please visit [email or web address]      

## 2019-05-08 NOTE — H&P (Addendum)
[name] [name] is an 78 y.o. female.   Chief Complaint: port in place HPI: Pt is a 78 yo F s/p treatment for right breast cancer.  She has completed her surgery and chemo and desires port removal.  She is otherwise doing well without new health problems.    Past Medical History:  Diagnosis Date  . AICD (automatic cardioverter/defibrillator) present   . ALLERGIC RHINITIS 11/06/2007  . 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 ANEMIA-NOS 06/17/2008  . ANXIETY 02/22/2008  . CARDIOMYOPATHY, PRIMARY, DILATED 12/30/2008  . [place] DISEASE, 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  . GERD 05/07/2007  . History of kidney stones   . HYPERLIPIDEMIA 05/23/2007  . HYPERTENSION 05/07/2007  . 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 MENOPAUSAL DISORDER 05/06/2008  . 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 OVERACTIVE BLADDER 08/03/2010  . PYELONEPHRITIS, HX OF 05/07/2007  . SYSTOLIC HEART FAILURE, CHRONIC 12/28/2008  . VITAMIN D DEFICIENCY 06/17/2008    Past Surgical History:  Procedure Laterality Date  . ABDOMINAL HYSTERECTOMY    . BREAST LUMPECTOMY WITH RADIOACTIVE SEED AND SENTINEL LYMPH NODE BIOPSY Right 10/11/2018   Procedure: RIGHT BREAST LUMPECTOMY WITH RADIOACTIVE SEED AND SENTINEL LYMPH NODE BIOPSY;  Surgeon: Stark Klein, MD;  Location: [facility];  Service: General;  Laterality: Right;  . ICD GENERATOR CHANGEOUT N/A 10/25/2017   Procedure: ICD GENERATOR CHANGEOUT;  Surgeon: Deboraha Sprang, MD;  Location: [facility];  Service: Cardiovascular;  Laterality: N/A;  . LAMINECTOMY    . PORTACATH PLACEMENT N/A 10/11/2018   Procedure: INSERTION PORT-A-CATH;  Surgeon: Stark Klein, MD;  Location: [facility];  Service: General;  Laterality: N/A;  . RE-EXCISION OF BREAST LUMPECTOMY Right 10/23/2018   Procedure: RE-EXCISION OF RIGHT BREAST LUMPECTOMY;  Surgeon: Stark Klein, MD;  Location: [facility];  Service: General;  Laterality: Right;    Family History  Problem Relation Age of Onset  . Arthritis Other   . Coronary  artery disease Other   . Kidney disease Other   . Heart disease Mother    Social History:  reports that she has never smoked. She has never used smokeless tobacco. She reports that she does not drink alcohol or use drugs.  Allergies:  Allergies  Allergen Reactions  . Topamax [Topiramate] Other (See Comments)    Confusion, gets lost going home  . Zocor [Simvastatin - High Dose] Nausea Only    Nausea     Medications Prior to Admission  Medication Sig Dispense Refill  . albuterol (PROVENTIL HFA;VENTOLIN HFA) 108 (90 Base) MCG/ACT inhaler Inhale 1 puff into the lungs every 6 (six) hours as needed for wheezing or shortness of breath.    . carvedilol (COREG) 25 MG tablet Take 1 tablet (25 mg total) by mouth 2 (two) times daily with a meal. Take 25 mg by mouth 2 (two) times daily with a meal. 60 tablet 5  . cholecalciferol (VITAMIN D3) 25 MCG (1000 UT) tablet Take 1,000 Units by mouth daily.    [name] ENTRESTO 24-26 MG TAKE 1 TABLET BY MOUTH 2 (TWO) TIMES DAILY. 180 tablet 3  . furosemide (LASIX) 40 MG tablet TAKE 1 TABLET BY MOUTH TWO  TIMES DAILY 180 tablet 0  . Multiple Vitamin (MULTIVITAMIN WITH MINERALS) TABS tablet Take 1 tablet by mouth daily.    . prochlorperazine (COMPAZINE) 10 MG tablet TAKE 1 TABLET (10 MG TOTAL) BY MOUTH EVERY 6 (SIX) HOURS AS NEEDED (NAUSEA OR VOMITING). 30 tablet 1  . rizatriptan (MAXALT) 10 MG tablet Take 10 mg by  mouth as needed for migraine. May repeat in 2 hours if needed    . spironolactone (ALDACTONE) 25 MG tablet Take 0.5 tablets (12.5 mg total) by mouth daily. 45 tablet 3  . LORazepam (ATIVAN) 0.5 MG tablet Take 1 tablet (0.5 mg total) by mouth at bedtime as needed for sleep. 30 tablet 0    No results found for this or any previous visit (from the past 48 hour(s)). No results found.  Review of Systems  All other systems reviewed and are negative.   Blood pressure (!) 142/72, pulse 73, temperature 98.4 F (36.9 C), temperature source Oral, resp. rate  18, height 5\' 7"  (1.702 m), weight 101.5 kg, SpO2 100 %. Physical Exam  Constitutional: She is oriented to person, place, and time. She appears well-developed and well-nourished. No distress.  HENT:  Head: Normocephalic and atraumatic.  Right Ear: External ear normal.  Left Ear: External ear normal.  Eyes: Pupils are equal, round, and reactive to light. Conjunctivae are normal. No scleral icterus.  Neck: Neck supple.  Cardiovascular: Normal rate.  Respiratory: Effort normal. No respiratory distress.  rightt sided port in place. Left side AICD  Neurological: She is alert and oriented to person, place, and time.  Skin: Skin is warm and dry. No rash noted. She is not diaphoretic. No erythema. No pallor.  Psychiatric: She has a normal mood and affect. Her behavior is normal. Judgment and thought content normal.     Assessment/Plan Port a cath in place. Right breast cancer.  Plan port removal.   Risks discussed as well as post op restrictions and recovery.  Questions answered. Pt desires to proceed.    Stark Klein, MD 05/08/2019, 9:01 AM

## 2019-05-08 NOTE — Anesthesia Postprocedure Evaluation (Signed)
Anesthesia Post Note  Patient: [name] [name]  Procedure(s) Performed: REMOVAL OF PORT-A-CATH RIGHT CHEST (N/A Chest)     Patient location during evaluation: PACU Anesthesia Type: MAC Level of consciousness: awake Pain management: pain level controlled Vital Signs Assessment: post-procedure vital signs reviewed and stable Respiratory status: spontaneous breathing Cardiovascular status: stable Postop Assessment: no apparent nausea or vomiting Anesthetic complications: no    Last Vitals:  Vitals:   05/08/19 1023 05/08/19 1045  BP: 127/60   Pulse: (!) 58 (!) 58  Resp: 17 18  Temp:  (!) 36.4 C  SpO2: 97%     Last Pain:  Vitals:   05/08/19 1038  TempSrc:   PainSc: 0-No pain                 Tadhg Eskew

## 2019-05-09 ENCOUNTER — Encounter (HOSPITAL_COMMUNITY): Payer: Self-pay | Admitting: General Surgery

## 2019-05-16 NOTE — Progress Notes (Signed)
Patient Care Team: Biagio Borg, MD as PCP - General Stanford Breed, Denice Bors, MD as Consulting Physician (Cardiology) Deboraha Sprang, MD as Consulting Physician (Cardiology) Rockwell Germany, RN as Oncology Nurse Navigator Mauro Kaufmann, RN as Oncology Nurse Navigator Nicholas Lose, MD as Consulting Physician (Hematology and Oncology) Stark Klein, MD as Consulting Physician (General Surgery) Eppie Gibson, MD as Attending Physician (Radiation Oncology)  DIAGNOSIS:    ICD-10-CM   1. Malignant neoplasm of lower-outer quadrant of right breast of female, estrogen receptor negative ([place])  C50.511    Z17.1     SUMMARY OF ONCOLOGIC HISTORY: Oncology History  Malignant neoplasm of lower-outer quadrant of right breast of female, estrogen receptor negative ([place])  08/29/2018 Initial Diagnosis   Screening mammogram detected 2 adjacent masses right breast 8:30 position LOQ 8 cm from nipple 1.1 cm and 5 mm, combined mass 1.7 cm, axilla negative, biopsy: Grade 3 IDC triple negative, Ki-67 80%, T1CN0 stage 1B clinical stage   09/05/2018 Cancer Staging   Staging form: Breast, AJCC 8th Edition - Clinical stage from 09/05/2018: Stage IB (cT1c, cN0, cM0, G3, ER-, PR-, HER2-) - Signed by Nicholas Lose, MD on 09/05/2018   10/11/2018 Surgery   Right lumpectomy: Grade 3 IDC, 1.6 cm, 0/3 lymph nodes negative, superior margin broadly positive, triple negative, Ki-67 80%, T1CN0 stage Ib. Re-excision 10/23/18.   10/16/2018 Cancer Staging   Staging form: Breast, AJCC 8th Edition - Pathologic stage from 10/16/2018: Stage IB (pT1c, pN0, cM0, G3, ER-, PR-, HER2-) - Signed by Nicholas Lose, MD on 10/16/2018   11/15/2018 -  Chemotherapy   The patient had palonosetron (ALOXI) injection 0.25 mg, 0.25 mg, Intravenous,  Once, 6 of 6 cycles Administration: 0.25 mg (11/15/2018), 0.25 mg (12/06/2018), 0.25 mg (12/27/2018), 0.25 mg (01/17/2019), 0.25 mg (02/07/2019), 0.25 mg (02/28/2019) methotrexate (PF) chemo injection 65 mg, 84.5 mg,  Intravenous,  Once, 6 of 6 cycles Dose modification: 30 mg/m2 (original dose 40 mg/m2, Cycle 2, Reason: Change in SCr/CrCl, Comment: CrCl 53), 25 mg/m2 (original dose 40 mg/m2, Cycle 5, Reason: Dose not tolerated) Administration: 65 mg (11/15/2018), 65 mg (12/06/2018), 65 mg (12/27/2018), 63.25 mg (01/17/2019), 53 mg (02/07/2019), 53 mg (02/28/2019) cyclophosphamide (CYTOXAN) 1,260 mg in sodium chloride 0.9 % 250 mL chemo infusion, 600 mg/m2 = 1,260 mg, Intravenous,  Once, 6 of 6 cycles Dose modification: 500 mg/m2 (original dose 600 mg/m2, Cycle 5, Reason: Provider Judgment) Administration: 1,260 mg (11/15/2018), 1,260 mg (12/06/2018), 1,260 mg (12/27/2018), 1,260 mg (01/17/2019), 1,060 mg (02/07/2019), 1,060 mg (02/28/2019) fluorouracil (ADRUCIL) chemo injection 1,250 mg, 600 mg/m2 = 1,250 mg, Intravenous,  Once, 6 of 6 cycles Dose modification: 400 mg/m2 (original dose 600 mg/m2, Cycle 5, Reason: Dose not tolerated) Administration: 1,250 mg (11/15/2018), 1,250 mg (12/06/2018), 1,250 mg (12/27/2018), 1,250 mg (01/17/2019), 850 mg (02/07/2019), 850 mg (02/28/2019)  for chemotherapy treatment.    03/29/2019 - 04/19/2019 Radiation Therapy   Adjuvant radiation     CHIEF COMPLIANT: Follow-up of triple negative right breast cancer to discuss further treatment  INTERVAL HISTORY: [name] [name] is a 78 y.o. with above-mentioned history of triple negative right breast cancer who underwent a lumpectomyandre-excision, completed adjuvant chemotherapy with CMF, and completed radiation on 04/19/19. Her port was removed by Dr. Barry Dienes on 05/08/19. She presents to the clinic today to discuss further treatment.   REVIEW OF SYSTEMS:   Constitutional: Denies fevers, chills or abnormal weight loss Eyes: Denies blurriness of vision Ears, nose, mouth, throat, and face: Denies mucositis or sore throat  Respiratory: Denies cough, dyspnea or wheezes Cardiovascular: Denies palpitation, chest discomfort Gastrointestinal: Denies nausea,  heartburn or change in bowel habits Skin: Denies abnormal skin rashes Lymphatics: Denies new lymphadenopathy or easy bruising Neurological: Denies numbness, tingling or new weaknesses Behavioral/Psych: Mood is stable, no new changes  Extremities: No lower extremity edema Breast: denies any pain or lumps or nodules in either breasts All other systems were reviewed with the patient and are negative.  I have reviewed the past medical history, past surgical history, social history and family history with the patient and they are unchanged from previous note.  ALLERGIES:  is allergic to topamax [topiramate] and zocor [simvastatin - high dose].  MEDICATIONS:  Current Outpatient Medications  Medication Sig Dispense Refill   albuterol (PROVENTIL HFA;VENTOLIN HFA) 108 (90 Base) MCG/ACT inhaler Inhale 1 puff into the lungs every 6 (six) hours as needed for wheezing or shortness of breath.     carvedilol (COREG) 25 MG tablet Take 1 tablet (25 mg total) by mouth 2 (two) times daily with a meal. Take 25 mg by mouth 2 (two) times daily with a meal. 60 tablet 5   cholecalciferol (VITAMIN D3) 25 MCG (1000 UT) tablet Take 1,000 Units by mouth daily.     ENTRESTO 24-26 MG TAKE 1 TABLET BY MOUTH 2 (TWO) TIMES DAILY. 180 tablet 3   furosemide (LASIX) 40 MG tablet TAKE 1 TABLET BY MOUTH TWO  TIMES DAILY 180 tablet 0   LORazepam (ATIVAN) 0.5 MG tablet Take 1 tablet (0.5 mg total) by mouth at bedtime as needed for sleep. 30 tablet 0   Multiple Vitamin (MULTIVITAMIN WITH MINERALS) TABS tablet Take 1 tablet by mouth daily.     oxyCODONE (OXY IR/ROXICODONE) 5 MG immediate release tablet Take 0.5-1 tablets (2.5-5 mg total) by mouth every 6 (six) hours as needed for severe pain. 10 tablet 0   prochlorperazine (COMPAZINE) 10 MG tablet TAKE 1 TABLET (10 MG TOTAL) BY MOUTH EVERY 6 (SIX) HOURS AS NEEDED (NAUSEA OR VOMITING). 30 tablet 1   rizatriptan (MAXALT) 10 MG tablet Take 10 mg by mouth as needed for  migraine. May repeat in 2 hours if needed     spironolactone (ALDACTONE) 25 MG tablet Take 0.5 tablets (12.5 mg total) by mouth daily. 45 tablet 3   No current facility-administered medications for this visit.     PHYSICAL EXAMINATION: ECOG PERFORMANCE STATUS: 1 - Symptomatic but completely ambulatory  Vitals:   05/17/19 1018  BP: 119/61  Pulse: 68  Resp: 18  Temp: 98.5 F (36.9 C)  SpO2: 99%   Filed Weights   05/17/19 1018  Weight: 223 lb 1.6 oz (101.2 kg)    GENERAL: alert, no distress and comfortable SKIN: skin color, texture, turgor are normal, no rashes or significant lesions EYES: normal, Conjunctiva are pink and non-injected, sclera clear OROPHARYNX: no exudate, no erythema and lips, buccal mucosa, and tongue normal  NECK: supple, thyroid normal size, non-tender, without nodularity LYMPH: no palpable lymphadenopathy in the cervical, axillary or inguinal LUNGS: clear to auscultation and percussion with normal breathing effort HEART: regular rate & rhythm and no murmurs and no lower extremity edema ABDOMEN: abdomen soft, non-tender and normal bowel sounds MUSCULOSKELETAL: no cyanosis of digits and no clubbing  NEURO: alert & oriented x 3 with fluent speech, no focal motor/sensory deficits EXTREMITIES: No lower extremity edema  LABORATORY DATA:  I have reviewed the data as listed CMP Latest Ref Rng & Units 05/03/2019 02/28/2019 02/07/2019  Glucose 70 - 99 mg/dL  102(H) 107(H) 96  BUN 8 - 23 mg/dL 21 18 21   Creatinine 0.44 - 1.00 mg/dL 1.55(H) 1.34(H) 1.48(H)  Sodium 135 - 145 mmol/L 141 138 139  Potassium 3.5 - 5.1 mmol/L 4.6 4.7 4.4  Chloride 98 - 111 mmol/L 106 104 104  CO2 22 - 32 mmol/L 25 24 25   Calcium 8.9 - 10.3 mg/dL 9.5 9.4 9.3  Total Protein 6.5 - 8.1 g/dL - 7.2 7.2  Total Bilirubin 0.3 - 1.2 mg/dL - 0.2(L) 0.2(L)  Alkaline Phos 38 - 126 U/L - 80 92  AST 15 - 41 U/L - 17 14(L)  ALT 0 - 44 U/L - 18 10    Lab Results  Component Value Date   WBC 3.9  (L) 05/03/2019   HGB 10.7 (L) 05/03/2019   HCT 34.7 (L) 05/03/2019   MCV 85.3 05/03/2019   PLT 182 05/03/2019   NEUTROABS 1.8 02/28/2019    ASSESSMENT & PLAN:  Malignant neoplasm of lower-outer quadrant of right breast of female, estrogen receptor negative ([place]) 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%, T1CN0 stage Ib  Review: Superior marginwas broadly positive: Patient had surgery 10/23/2018: Benign Peripheral blood flow cytometry: Monoclonal B-cell population kappa restricted lymphocytes CD20 positive but lacks CD5, CD10, CD23 or CD103: B-cell lymphoproliferative process  Treatment plan: 1.Adjuvant chemotherapy with CMF x6 cycles tentative start date 11/15/2018-02/28/2019 2.Followed by adjuvant radiation 03/26/2019-04/19/2019 ----------------------------------------------------------------------------------------------------------------------------------------------------- Breast cancer surveillance: 1.  Breast exam 05/17/2019: Benign 2.  Mammogram will be scheduled for February 2021  Severe fatigue: I encouraged her to take over-the-counter B12.  If he does not get better with then I will set her up for B12 injections starting December. Return to clinic in 1 year for follow-up    No orders of the defined types were placed in this encounter.  The patient has a good understanding of the overall plan. she agrees with it. she will call with any problems that may develop before the next visit here.  Nicholas Lose, MD 05/17/2019  Julious Oka Dorshimer am acting as scribe for Dr. Nicholas Lose.  I have reviewed the above documentation for accuracy and completeness, and I agree with the above.

## 2019-05-17 ENCOUNTER — Telehealth: Payer: Self-pay | Admitting: Hematology and Oncology

## 2019-05-17 ENCOUNTER — Inpatient Hospital Stay: Payer: Medicare Other | Attending: Hematology and Oncology | Admitting: Hematology and Oncology

## 2019-05-17 ENCOUNTER — Other Ambulatory Visit: Payer: Self-pay

## 2019-05-17 DIAGNOSIS — Z923 Personal history of irradiation: Secondary | ICD-10-CM | POA: Diagnosis not present

## 2019-05-17 DIAGNOSIS — Z171 Estrogen receptor negative status [ER-]: Secondary | ICD-10-CM | POA: Insufficient documentation

## 2019-05-17 DIAGNOSIS — R5383 Other fatigue: Secondary | ICD-10-CM | POA: Diagnosis not present

## 2019-05-17 DIAGNOSIS — Z79899 Other long term (current) drug therapy: Secondary | ICD-10-CM | POA: Diagnosis not present

## 2019-05-17 DIAGNOSIS — C50511 Malignant neoplasm of lower-outer quadrant of right female breast: Secondary | ICD-10-CM | POA: Diagnosis not present

## 2019-05-17 DIAGNOSIS — Z9221 Personal history of antineoplastic chemotherapy: Secondary | ICD-10-CM | POA: Diagnosis not present

## 2019-05-17 NOTE — Telephone Encounter (Signed)
I left a message regarding schedule  

## 2019-05-17 NOTE — Assessment & Plan Note (Signed)
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%, T1CN0 stage Ib  Review: Superior marginwas broadly positive: Patient had surgery 10/23/2018: Benign Peripheral blood flow cytometry: Monoclonal B-cell population kappa restricted lymphocytes CD20 positive but lacks CD5, CD10, CD23 or CD103: B-cell lymphoproliferative process  Treatment plan: 1.Adjuvant chemotherapy with CMF x6 cycles tentative start date 11/15/2018-02/28/2019 2.Followed by adjuvant radiation 03/26/2019-04/19/2019 ----------------------------------------------------------------------------------------------------------------------------------------------------- Breast cancer surveillance: 1.  Breast exam 05/17/2019: Benign 2.  Mammogram will be scheduled for February 2021  Return to clinic in 1 year for follow-up

## 2019-05-24 ENCOUNTER — Ambulatory Visit: Payer: Medicare Other | Admitting: Internal Medicine

## 2019-05-24 ENCOUNTER — Other Ambulatory Visit: Payer: Self-pay | Admitting: Cardiology

## 2019-05-29 NOTE — Progress Notes (Signed)
Remote ICD transmission.   

## 2019-05-31 ENCOUNTER — Encounter: Payer: Self-pay | Admitting: Internal Medicine

## 2019-05-31 ENCOUNTER — Other Ambulatory Visit: Payer: Self-pay

## 2019-05-31 ENCOUNTER — Ambulatory Visit (INDEPENDENT_AMBULATORY_CARE_PROVIDER_SITE_OTHER): Payer: Medicare Other | Admitting: Internal Medicine

## 2019-05-31 VITALS — BP 124/86 | HR 55 | Temp 98.2°F | Ht 67.0 in | Wt 225.0 lb

## 2019-05-31 DIAGNOSIS — N183 Chronic kidney disease, stage 3 unspecified: Secondary | ICD-10-CM

## 2019-05-31 DIAGNOSIS — Z0000 Encounter for general adult medical examination without abnormal findings: Secondary | ICD-10-CM

## 2019-05-31 DIAGNOSIS — Z23 Encounter for immunization: Secondary | ICD-10-CM

## 2019-05-31 NOTE — Assessment & Plan Note (Signed)

## 2019-05-31 NOTE — Progress Notes (Signed)
Subjective:    Patient ID: [name] [name], female    DOB: 1940-09-01, 78 y.o.   MRN: [id number]  HPI  Here for wellness and f/u;  Overall doing ok;  Pt denies Chest pain, worsening SOB, DOE, wheezing, orthopnea, PND, worsening LE edema, palpitations, dizziness or syncope.  Pt denies neurological change such as new headache, facial or extremity weakness.  Pt denies polydipsia, polyuria, or low sugar symptoms. Pt states overall good compliance with treatment and medications, good tolerability, and has been trying to follow appropriate diet.  Pt denies worsening depressive symptoms, suicidal ideation or panic. No fever, night sweats, wt loss, loss of appetite, or other constitutional symptoms.  Pt states good ability with ADL's, has low fall risk, home safety reviewed and adequate, no other significant changes in hearing or vision, and not active with exercise. No new complaints Past Medical History:  Diagnosis Date  . AICD (automatic cardioverter/defibrillator) present   . ALLERGIC RHINITIS 11/06/2007  . 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 ANEMIA-NOS 06/17/2008  . ANXIETY 02/22/2008  . CARDIOMYOPATHY, PRIMARY, DILATED 12/30/2008  . [place] DISEASE, 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  . GERD 05/07/2007  . History of kidney stones   . HYPERLIPIDEMIA 05/23/2007  . HYPERTENSION 05/07/2007  . 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 MENOPAUSAL DISORDER 05/06/2008  . 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 OVERACTIVE BLADDER 08/03/2010  . PYELONEPHRITIS, HX OF 05/07/2007  . SYSTOLIC HEART FAILURE, CHRONIC 12/28/2008  . VITAMIN D DEFICIENCY 06/17/2008   Past Surgical History:  Procedure Laterality Date  . ABDOMINAL HYSTERECTOMY    . BREAST LUMPECTOMY WITH RADIOACTIVE SEED AND SENTINEL LYMPH NODE BIOPSY Right 10/11/2018   Procedure: RIGHT BREAST LUMPECTOMY WITH RADIOACTIVE SEED AND SENTINEL LYMPH NODE BIOPSY;  Surgeon: Stark Klein, MD;  Location: [facility];  Service: General;  Laterality: Right;  . ICD  GENERATOR CHANGEOUT N/A 10/25/2017   Procedure: ICD GENERATOR CHANGEOUT;  Surgeon: Deboraha Sprang, MD;  Location: [facility];  Service: Cardiovascular;  Laterality: N/A;  . LAMINECTOMY    . PORT-A-CATH REMOVAL N/A 05/08/2019   Procedure: REMOVAL OF PORT-A-CATH RIGHT CHEST;  Surgeon: Stark Klein, MD;  Location: [facility];  Service: General;  Laterality: N/A;  . PORTACATH PLACEMENT N/A 10/11/2018   Procedure: INSERTION PORT-A-CATH;  Surgeon: Stark Klein, MD;  Location: [facility];  Service: General;  Laterality: N/A;  . RE-EXCISION OF BREAST LUMPECTOMY Right 10/23/2018   Procedure: RE-EXCISION OF RIGHT BREAST LUMPECTOMY;  Surgeon: Stark Klein, MD;  Location: [facility];  Service: General;  Laterality: Right;    reports that she has never smoked. She has never used smokeless tobacco. She reports that she does not drink alcohol or use drugs. family history includes Arthritis in an other family member; Coronary artery disease in an other family member; Heart disease in her mother; Kidney disease in an other family member. Allergies  Allergen Reactions  . Topamax [Topiramate] Other (See Comments)    Confusion, gets lost going home  . Zocor [Simvastatin - High Dose] Nausea Only    Nausea    Current Outpatient Medications on File Prior to Visit  Medication Sig Dispense Refill  . albuterol (PROVENTIL HFA;VENTOLIN HFA) 108 (90 Base) MCG/ACT inhaler Inhale 1 puff into the lungs every 6 (six) hours as needed for wheezing or shortness of breath.    . carvedilol (COREG) 25 MG tablet Take 1 tablet (25 mg total) by mouth 2 (two) times daily with a meal. Take 25 mg by mouth 2 (two) times daily  with a meal. 60 tablet 5  . cholecalciferol (VITAMIN D3) 25 MCG (1000 UT) tablet Take 1,000 Units by mouth daily.    [name] ENTRESTO 24-26 MG TAKE 1 TABLET BY MOUTH 2 (TWO) TIMES DAILY. 180 tablet 3  . furosemide (LASIX) 40 MG tablet TAKE 1 TABLET BY MOUTH  TWICE DAILY 180 tablet 3  . Multiple Vitamin (MULTIVITAMIN WITH  MINERALS) TABS tablet Take 1 tablet by mouth daily.    [name] oxyCODONE (OXY IR/ROXICODONE) 5 MG immediate release tablet Take 0.5-1 tablets (2.5-5 mg total) by mouth every 6 (six) hours as needed for severe pain. 10 tablet 0  . rizatriptan (MAXALT) 10 MG tablet Take 10 mg by mouth as needed for migraine. May repeat in 2 hours if needed    . spironolactone (ALDACTONE) 25 MG tablet Take 0.5 tablets (12.5 mg total) by mouth daily. 45 tablet 3  . [DISCONTINUED] prochlorperazine (COMPAZINE) 10 MG tablet TAKE 1 TABLET (10 MG TOTAL) BY MOUTH EVERY 6 (SIX) HOURS AS NEEDED (NAUSEA OR VOMITING). 30 tablet 1   No current facility-administered medications on file prior to visit.    Review of Systems Constitutional: Negative for other unusual diaphoresis, sweats, appetite or weight changes HENT: Negative for other worsening hearing loss, ear pain, facial swelling, mouth sores or neck stiffness.   Eyes: Negative for other worsening pain, redness or other visual disturbance.  Respiratory: Negative for other stridor or swelling Cardiovascular: Negative for other palpitations or other chest pain  Gastrointestinal: Negative for worsening diarrhea or loose stools, blood in stool, distention or other pain Genitourinary: Negative for hematuria, flank pain or other change in urine volume.  Musculoskeletal: Negative for myalgias or other joint swelling.  Skin: Negative for other color change, or other wound or worsening drainage.  Neurological: Negative for other syncope or numbness. Hematological: Negative for other adenopathy or swelling Psychiatric/Behavioral: Negative for hallucinations, other worsening agitation, SI, self-injury, or new decreased concentration All otherwise neg per pt     Objective:   Physical Exam BP 124/86   Pulse (!) 55   Temp 98.2 F (36.8 C) (Oral)   Ht 5\' 7"  (1.702 m)   Wt 225 lb (102.1 kg)   SpO2 97%   BMI 35.24 kg/m  VS noted,  Constitutional: Pt is oriented to person, place,  and time. Appears well-developed and well-nourished, in no significant distress and comfortable Head: Normocephalic and atraumatic  Eyes: Conjunctivae and EOM are normal. Pupils are equal, round, and reactive to light Right Ear: External ear normal without discharge Left Ear: External ear normal without discharge Nose: Nose without discharge or deformity Mouth/Throat: Oropharynx is without other ulcerations and moist  Neck: Normal range of motion. Neck supple. No JVD present. No tracheal deviation present or significant neck LA or mass Cardiovascular: Normal rate, regular rhythm, normal heart sounds and intact distal pulses.   Pulmonary/Chest: WOB normal and breath sounds without rales or wheezing  Abdominal: Soft. Bowel sounds are normal. NT. No HSM  Musculoskeletal: Normal range of motion. Exhibits no edema Lymphadenopathy: Has no other cervical adenopathy.  Neurological: Pt is alert and oriented to person, place, and time. Pt has normal reflexes. No cranial nerve deficit. Motor grossly intact, Gait intact Skin: Skin is warm and dry. No rash noted or new ulcerations Psychiatric:  Has normal mood and affect. Behavior is normal without agitation All otherwise neg per pt Lab Results  Component Value Date   WBC 3.9 (L) 05/03/2019   HGB 10.7 (L) 05/03/2019   HCT 34.7 (  L) 05/03/2019   PLT 182 05/03/2019   GLUCOSE 102 (H) 05/03/2019   CHOL 193 05/22/2018   TRIG 149.0 05/22/2018   HDL 58.40 05/22/2018   LDLDIRECT 129.7 02/07/2013   LDLCALC 104 (H) 05/22/2018   ALT 18 02/28/2019   AST 17 02/28/2019   NA 141 05/03/2019   K 4.6 05/03/2019   CL 106 05/03/2019   CREATININE 1.55 (H) 05/03/2019   BUN 21 05/03/2019   CO2 25 05/03/2019   TSH 1.01 05/22/2018   INR 0.9 RATIO 06/18/2007   HGBA1C 5.9 11/06/2012  Declines further labs today  This visit occurred during the SARS-CoV-2 public health emergency.  Safety protocols were in place, including screening questions prior to the visit,  additional usage of staff PPE, and extensive cleaning of exam room while observing appropriate contact time as indicated for disinfecting solutions.       Assessment & Plan:

## 2019-05-31 NOTE — Assessment & Plan Note (Signed)
For renal referral after jan 1 per pt request

## 2019-05-31 NOTE — Patient Instructions (Signed)
Please continue all other medications as before, and refills have been done if requested.  Please have the pharmacy call with any other refills you may need.  Please continue your efforts at being more active, low cholesterol diet, and weight control.  You are otherwise up to date with prevention measures today.  Please keep your appointments with your specialists as you may have planned  Please return in 6 months, or sooner if needed 

## 2019-06-18 NOTE — Progress Notes (Deleted)
HPI: FU nonischemic cardiomyopathy.Cardiac catheterization August 2004 showed normal coronary arteries.She had an ICD placed on June 25, 2007. 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 Patient was referred to Dr. Lovena Le by Dr. Caryl Comes for consideration of ablation but Dr. Lovena Le felt medical therapy indicated.Nuclear study repeated April 2018 and showed ejection fraction 35% with no ischemia or infarction.Echocardiogram March 2020 showed ejection fraction 20 to 25%, severe left ventricular enlargement, grade 2 diastolic dysfunction, mild left atrial enlargement, mild to moderate mitral regurgitation.  Since I last saw her,  Current Outpatient Medications  Medication Sig Dispense Refill  . albuterol (PROVENTIL HFA;VENTOLIN HFA) 108 (90 Base) MCG/ACT inhaler Inhale 1 puff into the lungs every 6 (six) hours as needed for wheezing or shortness of breath.    . carvedilol (COREG) 25 MG tablet Take 1 tablet (25 mg total) by mouth 2 (two) times daily with a meal. Take 25 mg by mouth 2 (two) times daily with a meal. 60 tablet 5  . cholecalciferol (VITAMIN D3) 25 MCG (1000 UT) tablet Take 1,000 Units by mouth daily.    [name] ENTRESTO 24-26 MG TAKE 1 TABLET BY MOUTH 2 (TWO) TIMES DAILY. 180 tablet 3  . furosemide (LASIX) 40 MG tablet TAKE 1 TABLET BY MOUTH  TWICE DAILY 180 tablet 3  . Multiple Vitamin (MULTIVITAMIN WITH MINERALS) TABS tablet Take 1 tablet by mouth daily.    [name] oxyCODONE (OXY IR/ROXICODONE) 5 MG immediate release tablet Take 0.5-1 tablets (2.5-5 mg total) by mouth every 6 (six) hours as needed for severe pain. 10 tablet 0  . rizatriptan (MAXALT) 10 MG tablet Take 10 mg by mouth as needed for migraine. May repeat in 2 hours if needed    . spironolactone (ALDACTONE) 25 MG tablet Take 0.5 tablets (12.5 mg total) by mouth daily. 45 tablet 3   No current facility-administered medications for this visit.      Past Medical History:  Diagnosis Date  .  AICD (automatic cardioverter/defibrillator) present   . ALLERGIC RHINITIS 11/06/2007  . 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 ANEMIA-NOS 06/17/2008  . ANXIETY 02/22/2008  . CARDIOMYOPATHY, PRIMARY, DILATED 12/30/2008  . [place] DISEASE, 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  . GERD 05/07/2007  . History of kidney stones   . HYPERLIPIDEMIA 05/23/2007  . HYPERTENSION 05/07/2007  . 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 MENOPAUSAL DISORDER 05/06/2008  . 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 OVERACTIVE BLADDER 08/03/2010  . PYELONEPHRITIS, HX OF 05/07/2007  . SYSTOLIC HEART FAILURE, CHRONIC 12/28/2008  . VITAMIN D DEFICIENCY 06/17/2008    Past Surgical History:  Procedure Laterality Date  . ABDOMINAL HYSTERECTOMY    . BREAST LUMPECTOMY WITH RADIOACTIVE SEED AND SENTINEL LYMPH NODE BIOPSY Right 10/11/2018   Procedure: RIGHT BREAST LUMPECTOMY WITH RADIOACTIVE SEED AND SENTINEL LYMPH NODE BIOPSY;  Surgeon: Stark Klein, MD;  Location: [facility];  Service: General;  Laterality: Right;  . ICD GENERATOR CHANGEOUT N/A 10/25/2017   Procedure: ICD GENERATOR CHANGEOUT;  Surgeon: Deboraha Sprang, MD;  Location: [facility];  Service: Cardiovascular;  Laterality: N/A;  . LAMINECTOMY    . PORT-A-CATH REMOVAL N/A 05/08/2019   Procedure: REMOVAL OF PORT-A-CATH RIGHT CHEST;  Surgeon: Stark Klein, MD;  Location: [facility];  Service: General;  Laterality: N/A;  . PORTACATH PLACEMENT N/A 10/11/2018   Procedure: INSERTION PORT-A-CATH;  Surgeon: Stark Klein, MD;  Location: [facility];  Service: General;  Laterality: N/A;  . RE-EXCISION OF BREAST LUMPECTOMY Right 10/23/2018  Procedure: RE-EXCISION OF RIGHT BREAST LUMPECTOMY;  Surgeon: Stark Klein, MD;  Location: [facility];  Service: General;  Laterality: Right;    Social History   Socioeconomic History  . Marital status: Widowed    Spouse name: Not on file  . Number of children: 3  . Years of education: Not on file  . Highest education level: Not on file   Occupational History  . Occupation: retired Ambulance person: RETIRED  Social Needs  . Financial resource strain: Not on file  . Food insecurity    Worry: Not on file    Inability: Not on file  . Transportation needs    Medical: No    Non-medical: No  Tobacco Use  . Smoking status: Never Smoker  . Smokeless tobacco: Never Used  Substance and Sexual Activity  . Alcohol use: No  . Drug use: No  . Sexual activity: Never  Lifestyle  . Physical activity    Days per week: Not on file    Minutes per session: Not on file  . Stress: Not on file  Relationships  . Social Herbalist on phone: Not on file    Gets together: Not on file    Attends religious service: Not on file    Active member of club or organization: Not on file    Attends meetings of clubs or organizations: Not on file    Relationship status: Not on file  . Intimate partner violence    Fear of current or ex partner: No    Emotionally abused: No    Physically abused: No    Forced sexual activity: No  Other Topics Concern  . Not on file  Social History Narrative  . Not on file    Family History  Problem Relation Age of Onset  . Arthritis Other   . Coronary artery disease Other   . Kidney disease Other   . Heart disease Mother     ROS: no fevers or chills, productive cough, hemoptysis, dysphasia, odynophagia, melena, hematochezia, dysuria, hematuria, rash, seizure activity, orthopnea, PND, pedal edema, claudication. Remaining systems are negative.  Physical Exam: Well-developed well-nourished in no acute distress.  Skin is warm and dry.  HEENT is normal.  Neck is supple.  Chest is clear to auscultation with normal expansion.  Cardiovascular exam is regular rate and rhythm.  Abdominal exam nontender or distended. No masses palpated. Extremities show no edema. neuro grossly intact  ECG- personally reviewed  A/P  1 nonischemic cardiomyopathy-plan to continue present medications  including Entresto, carvedilol and spironolactone.  Check potassium and renal function.  2 chronic systolic congestive heart failure-she is euvolemic today on examination.  Continue Lasix and spironolactone at present dose.  3 hypertension-blood pressure controlled.  Continue present medications.  4 hyperlipidemia-continue statin.  5 prior ICD-managed by Dr. Caryl Comes.  6 history of PVCs-continue beta-blocker.  Kirk Ruths, MD

## 2019-06-24 ENCOUNTER — Ambulatory Visit: Payer: Medicare Other | Admitting: Cardiology

## 2019-06-26 ENCOUNTER — Other Ambulatory Visit: Payer: Self-pay | Admitting: Internal Medicine

## 2019-06-28 NOTE — Progress Notes (Signed)
  Patient Name: [name] [name] MRN: [id number] DOB: [birth year] Referring Physician: Nicholas Lose (Profile Not Attached) Date of Service: 04/19/2019 [facility], [place]                                                        End Of Treatment Note  Diagnoses: C50.511-Malignant neoplasm of lower-outer quadrant of right female breast  Cancer Staging: Malignant neoplasm of lower-outer quadrant of right breast of female, estrogen receptor negative ([place]) Staging form: Breast, AJCC 8th Edition - Clinical stage from 09/05/2018: Stage IB (cT1c, cN0, cM0, G3, ER-, PR-, HER2-) - Signed by Nicholas Lose, MD on 09/05/2018 - Pathologic stage from 10/16/2018: Stage IB (pT1c, pN0, cM0, G3, ER-, PR-, HER2-) - Signed by Nicholas Lose, MD on 10/16/2018  Intent: Curative  Radiation Treatment Dates: 03/25/2019 through 04/19/2019 Site Technique Total Dose (Gy) Dose per Fx (Gy) Completed Fx Beam Energies  Breast: Breast_Rt 3D 40.05/40.05 2.67 15/15 6X  Breast: Breast_Rt_Bst 3D 10/10 2 5/5 6X   Narrative: The patient tolerated radiation therapy relatively well.   Plan: The patient will follow-up with radiation oncology in 18mo or as needed.  -----------------------------------  SEppie Gibson MD

## 2019-07-01 ENCOUNTER — Inpatient Hospital Stay: Payer: Medicare Other | Attending: Hematology and Oncology | Admitting: Adult Health

## 2019-07-01 ENCOUNTER — Encounter: Payer: Self-pay | Admitting: Adult Health

## 2019-07-01 ENCOUNTER — Inpatient Hospital Stay: Payer: Medicare Other

## 2019-07-01 ENCOUNTER — Telehealth: Payer: Self-pay | Admitting: Adult Health

## 2019-07-01 ENCOUNTER — Other Ambulatory Visit: Payer: Self-pay

## 2019-07-01 VITALS — BP 119/68 | HR 79 | Temp 98.3°F | Resp 18 | Ht 67.0 in | Wt 225.5 lb

## 2019-07-01 DIAGNOSIS — I429 Cardiomyopathy, unspecified: Secondary | ICD-10-CM | POA: Insufficient documentation

## 2019-07-01 DIAGNOSIS — E538 Deficiency of other specified B group vitamins: Secondary | ICD-10-CM | POA: Insufficient documentation

## 2019-07-01 DIAGNOSIS — M25611 Stiffness of right shoulder, not elsewhere classified: Secondary | ICD-10-CM

## 2019-07-01 DIAGNOSIS — Z79899 Other long term (current) drug therapy: Secondary | ICD-10-CM | POA: Diagnosis not present

## 2019-07-01 DIAGNOSIS — Z95810 Presence of automatic (implantable) cardiac defibrillator: Secondary | ICD-10-CM | POA: Insufficient documentation

## 2019-07-01 DIAGNOSIS — Z171 Estrogen receptor negative status [ER-]: Secondary | ICD-10-CM | POA: Insufficient documentation

## 2019-07-01 DIAGNOSIS — C50511 Malignant neoplasm of lower-outer quadrant of right female breast: Secondary | ICD-10-CM | POA: Insufficient documentation

## 2019-07-01 DIAGNOSIS — R5383 Other fatigue: Secondary | ICD-10-CM

## 2019-07-01 MED ORDER — CYANOCOBALAMIN 1000 MCG/ML IJ SOLN
INTRAMUSCULAR | Status: AC
  Filled 2019-07-01: qty 1

## 2019-07-01 MED ORDER — CYANOCOBALAMIN 1000 MCG/ML IJ SOLN
1000.0000 ug | Freq: Once | INTRAMUSCULAR | Status: AC
  Administered 2019-07-01: 1000 ug via INTRAMUSCULAR

## 2019-07-01 NOTE — Telephone Encounter (Signed)
I left a message regarding schedule will mail 

## 2019-07-01 NOTE — Patient Instructions (Signed)

## 2019-07-01 NOTE — Progress Notes (Signed)
CLINIC:  Survivorship   REASON FOR VISIT:  Routine follow-up post-treatment for a recent history of breast cancer.  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 of female, estrogen receptor negative ([place])  08/29/2018 Initial Diagnosis   Screening mammogram detected 2 adjacent masses right breast 8:30 position LOQ 8 cm from nipple 1.1 cm and 5 mm, combined mass 1.7 cm, axilla negative, biopsy ([id number]): Grade 3 IDC triple negative, Ki-67 80%, T1CN0 stage 1B clinical stage   09/05/2018 Cancer Staging   Staging form: Breast, AJCC 8th Edition - Clinical stage from 09/05/2018: Stage IB (cT1c, cN0, cM0, G3, ER-, PR-, HER2-) - Signed by Nicholas Lose, MD on 09/05/2018   10/11/2018 Surgery   Right lumpectomy ([id number]): Grade 3 IDC, 1.6 cm, 0/3 lymph nodes negative, superior margin broadly positive, triple negative, Ki-67 80%, T1CN0 stage Ib. 0/3 lymph nodes positive for carcinoma.   10/16/2018 Cancer Staging   Staging form: Breast, AJCC 8th Edition - Pathologic stage from 10/16/2018: Stage IB (pT1c, pN0, cM0, G3, ER-, PR-, HER2-) - Signed by Nicholas Lose, MD on 10/16/2018   10/23/2018 Surgery   Reexcision [phone]): margins negative for carcinoma   11/15/2018 - 03/20/2019 Chemotherapy   The patient had palonosetron (ALOXI) injection 0.25 mg, 0.25 mg, Intravenous,  Once, 6 of 6 cycles Administration: 0.25 mg (11/15/2018), 0.25 mg (12/06/2018), 0.25 mg (12/27/2018), 0.25 mg (01/17/2019), 0.25 mg (02/07/2019), 0.25 mg (02/28/2019) methotrexate (PF) chemo injection 65 mg, 84.5 mg, Intravenous,  Once, 6 of 6 cycles Dose modification: 30 mg/m2 (original dose 40 mg/m2, Cycle 2, Reason: Change in SCr/CrCl, Comment: CrCl 53), 25 mg/m2 (original dose 40 mg/m2, Cycle 5, Reason: Dose not tolerated) Administration: 65 mg (11/15/2018), 65 mg (12/06/2018), 65 mg (12/27/2018), 63.25 mg (01/17/2019), 53 mg (02/07/2019), 53 mg (02/28/2019) cyclophosphamide (CYTOXAN) 1,260  mg in sodium chloride 0.9 % 250 mL chemo infusion, 600 mg/m2 = 1,260 mg, Intravenous,  Once, 6 of 6 cycles Dose modification: 500 mg/m2 (original dose 600 mg/m2, Cycle 5, Reason: Provider Judgment) Administration: 1,260 mg (11/15/2018), 1,260 mg (12/06/2018), 1,260 mg (12/27/2018), 1,260 mg (01/17/2019), 1,060 mg (02/07/2019), 1,060 mg (02/28/2019) fluorouracil (ADRUCIL) chemo injection 1,250 mg, 600 mg/m2 = 1,250 mg, Intravenous,  Once, 6 of 6 cycles Dose modification: 400 mg/m2 (original dose 600 mg/m2, Cycle 5, Reason: Dose not tolerated) Administration: 1,250 mg (11/15/2018), 1,250 mg (12/06/2018), 1,250 mg (12/27/2018), 1,250 mg (01/17/2019), 850 mg (02/07/2019), 850 mg (02/28/2019)  for chemotherapy treatment.    03/26/2019 - 04/19/2019 Radiation Therapy   40.05 Gy in 15 fractions to the right breast with 2 tangential fields. Followed by a boost.     INTERVAL HISTORY:  [name] [name] presents to the [facility] today for our initial meeting to review her survivorship care plan detailing her treatment course for breast cancer, as well as monitoring long-term side effects of that treatment, education regarding health maintenance, screening, and overall wellness and health promotion.     Overall, [name] [name] reports feeling quite well.  She notes some numbness under her arm and occasional sharp pain in her breast that happens a couple of times per day.  She has some pain with range of motion and this is making it difficult to comb her hair.    She is due to start B12 injections today for fatigue, b12 deficiency, and no improvement on oral supplementation.   REVIEW OF SYSTEMS:  Review of Systems  Constitutional: Positive for fatigue. Negative for appetite change, chills, fever and  unexpected weight change.  HENT:   Negative for hearing loss, lump/mass and mouth sores.   Eyes: Negative for eye problems and icterus.  Respiratory: Negative for chest tightness, cough and shortness of breath.     Cardiovascular: Negative for chest pain, leg swelling and palpitations.  Gastrointestinal: Negative for abdominal distention, abdominal pain, constipation, diarrhea, nausea and vomiting.  Endocrine: Negative for hot flashes.  Genitourinary: Negative for difficulty urinating.   Musculoskeletal: Negative for arthralgias.  Skin: Negative for itching and rash.  Neurological: Negative for dizziness, extremity weakness, headaches and numbness.  Hematological: Negative for adenopathy. Does not bruise/bleed easily.  Psychiatric/Behavioral: Negative for depression. The patient is not nervous/anxious.   Breast: Denies any new nodularity, masses, tenderness, nipple changes, or nipple discharge.      ONCOLOGY TREATMENT TEAM:  1. Surgeon:  Dr. Barry Dienes at [facility] 2. Medical Oncologist: Dr. Lindi Adie  3. Radiation Oncologist: Dr. Isidore Moos    PAST MEDICAL/SURGICAL HISTORY:  Past Medical History:  Diagnosis Date  . AICD (automatic cardioverter/defibrillator) present   . ALLERGIC RHINITIS 11/06/2007  . 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 ANEMIA-NOS 06/17/2008  . ANXIETY 02/22/2008  . CARDIOMYOPATHY, PRIMARY, DILATED 12/30/2008  . [place] DISEASE, 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  . GERD 05/07/2007  . History of kidney stones   . HYPERLIPIDEMIA 05/23/2007  . HYPERTENSION 05/07/2007  . 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 MENOPAUSAL DISORDER 05/06/2008  . 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 OVERACTIVE BLADDER 08/03/2010  . PYELONEPHRITIS, HX OF 05/07/2007  . SYSTOLIC HEART FAILURE, CHRONIC 12/28/2008  . VITAMIN D DEFICIENCY 06/17/2008   Past Surgical History:  Procedure Laterality Date  . ABDOMINAL HYSTERECTOMY    . BREAST LUMPECTOMY WITH RADIOACTIVE SEED AND SENTINEL LYMPH NODE BIOPSY Right 10/11/2018   Procedure: RIGHT BREAST LUMPECTOMY WITH RADIOACTIVE SEED AND SENTINEL LYMPH NODE BIOPSY;  Surgeon: Stark Klein, MD;  Location: [place];  Service: General;  Laterality: Right;   . ICD GENERATOR CHANGEOUT N/A 10/25/2017   Procedure: ICD GENERATOR CHANGEOUT;  Surgeon: Deboraha Sprang, MD;  Location: [facility];  Service: Cardiovascular;  Laterality: N/A;  . LAMINECTOMY    . PORT-A-CATH REMOVAL N/A 05/08/2019   Procedure: REMOVAL OF PORT-A-CATH RIGHT CHEST;  Surgeon: Stark Klein, MD;  Location: [facility];  Service: General;  Laterality: N/A;  . PORTACATH PLACEMENT N/A 10/11/2018   Procedure: INSERTION PORT-A-CATH;  Surgeon: Stark Klein, MD;  Location: [facility];  Service: General;  Laterality: N/A;  . RE-EXCISION OF BREAST LUMPECTOMY Right 10/23/2018   Procedure: RE-EXCISION OF RIGHT BREAST LUMPECTOMY;  Surgeon: Stark Klein, MD;  Location: [facility];  Service: General;  Laterality: Right;     ALLERGIES:  Allergies  Allergen Reactions  . Topamax [Topiramate] Other (See Comments)    Confusion, gets lost going home  . Zocor [Simvastatin - High Dose] Nausea Only    Nausea      CURRENT MEDICATIONS:  Outpatient Encounter Medications as of 07/01/2019  Medication Sig  . albuterol (PROVENTIL HFA;VENTOLIN HFA) 108 (90 Base) MCG/ACT inhaler Inhale 1 puff into the lungs every 6 (six) hours as needed for wheezing or shortness of breath.  . carvedilol (COREG) 25 MG tablet TAKE 1 TABLET BY MOUTH TWICE A DAY  . cholecalciferol (VITAMIN D3) 25 MCG (1000 UT) tablet Take 1,000 Units by mouth daily.  [name] ENTRESTO 24-26 MG TAKE 1 TABLET BY MOUTH 2 (TWO) TIMES DAILY.  . furosemide (LASIX) 40 MG tablet TAKE 1 TABLET BY MOUTH  TWICE DAILY  . Multiple Vitamin (  MULTIVITAMIN WITH MINERALS) TABS tablet Take 1 tablet by mouth daily.  [name] oxyCODONE (OXY IR/ROXICODONE) 5 MG immediate release tablet Take 0.5-1 tablets (2.5-5 mg total) by mouth every 6 (six) hours as needed for severe pain.  . rizatriptan (MAXALT) 10 MG tablet Take 10 mg by mouth as needed for migraine. May repeat in 2 hours if needed  . spironolactone (ALDACTONE) 25 MG tablet Take 0.5 tablets (12.5 mg total) by mouth daily.  .  [DISCONTINUED] carvedilol (COREG) 25 MG tablet Take 1 tablet (25 mg total) by mouth 2 (two) times daily with a meal. Take 25 mg by mouth 2 (two) times daily with a meal.  . [DISCONTINUED] prochlorperazine (COMPAZINE) 10 MG tablet TAKE 1 TABLET (10 MG TOTAL) BY MOUTH EVERY 6 (SIX) HOURS AS NEEDED (NAUSEA OR VOMITING).   No facility-administered encounter medications on file as of 07/01/2019.     ONCOLOGIC FAMILY HISTORY:  Family History  Problem Relation Age of Onset  . Arthritis Other   . Coronary artery disease Other   . Kidney disease Other   . Heart disease Mother      GENETIC COUNSELING/TESTING: Not at this time  SOCIAL HISTORY:  Social History   Socioeconomic History  . Marital status: Widowed    Spouse name: Not on file  . Number of children: 3  . Years of education: Not on file  . Highest education level: Not on file  Occupational History  . Occupation: retired Ambulance person: RETIRED  Tobacco Use  . Smoking status: Never Smoker  . Smokeless tobacco: Never Used  Substance and Sexual Activity  . Alcohol use: No  . Drug use: No  . Sexual activity: Never  Other Topics Concern  . Not on file  Social History Narrative  . Not on file   Social Determinants of Health   Financial Resource Strain:   . Difficulty of Paying Living Expenses: Not on file  Food Insecurity:   . Worried About Charity fundraiser in the Last Year: Not on file  . Ran Out of Food in the Last Year: Not on file  Transportation Needs: No Transportation Needs  . Lack of Transportation (Medical): No  . Lack of Transportation (Non-Medical): No  Physical Activity:   . Days of Exercise per Week: Not on file  . Minutes of Exercise per Session: Not on file  Stress:   . Feeling of Stress : Not on file  Social Connections:   . Frequency of Communication with Friends and Family: Not on file  . Frequency of Social Gatherings with Friends and Family: Not on file  . Attends Religious  Services: Not on file  . Active Member of Clubs or Organizations: Not on file  . Attends Archivist Meetings: Not on file  . Marital Status: Not on file  Intimate Partner Violence: Not At Risk  . Fear of Current or Ex-Partner: No  . Emotionally Abused: No  . Physically Abused: No  . Sexually Abused: No     PHYSICAL EXAMINATION:  Vital Signs:   Vitals:   07/01/19 1003  BP: 119/68  Pulse: 79  Resp: 18  Temp: 98.3 F (36.8 C)  SpO2: 99%   Filed Weights   07/01/19 1003  Weight: 225 lb 8 oz (102.3 kg)   General: Well-nourished, well-appearing female in no acute distress.  She is unaccompanied today.   HEENT: Head is normocephalic.  Pupils equal and reactive to light. Conjunctivae clear without exudate.  Sclerae anicteric. Oral mucosa is pink, moist.  Oropharynx is pink without lesions or erythema.  Lymph: No cervical, supraclavicular, or infraclavicular lymphadenopathy noted on palpation.  Cardiovascular: Regular rate and rhythm.[name] Respiratory: Clear to auscultation bilaterally. Chest expansion symmetric; breathing non-labored.  Breasts: right breast s/p lumpectomy and radiation; no sign of local recurrence, left breast benign GI: Abdomen soft and round; non-tender, non-distended. Bowel sounds normoactive.  GU: Deferred.  Neuro: No focal deficits. Steady gait.  Psych: Mood and affect normal and appropriate for situation.  Extremities: No edema. MSK: No focal spinal tenderness to palpation.  Full range of motion in bilateral upper extremities Skin: Warm and dry.  LABORATORY DATA:  None for this visit.  DIAGNOSTIC IMAGING:  None for this visit.      ASSESSMENT AND PLAN:  Ms.. [name] is a pleasant 78 y.o. female with Stage IB right breast invasive ductal carcinoma, ER-/PR-/HER2-, diagnosed in 08/2018, treated with lumpectomy, adjuvant chemotherapy, adjuvant radiation therapy.  She presents to the Survivorship Clinic for our initial meeting and routine follow-up  post-completion of treatment for breast cancer.    1. Stage IB right breast cancer:  [name] [name] is continuing to recover from definitive treatment for breast cancer. She will follow-up with her medical oncologist, Dr. Lindi Adie in 05/2020 with history and physical exam per surveillance protocol.   Today, a comprehensive survivorship care plan and treatment summary was reviewed with the patient today detailing her breast cancer diagnosis, treatment course, potential late/long-term effects of treatment, appropriate follow-up care with recommendations for the future, and patient education resources.  A copy of this summary, along with a letter will be sent to the patient's primary care provider via mail/fax/In Basket message after today's visit.    2. B12 deficiency/fatigue: Patient fatigue unimproved with Dr. Lindi Adie and she will proceed with b12 injections every 4 weeks.  We will recheck labs in 12 weeks.  If she still notes fatigue in 12 weeks she will call us and let us know.    3. Right shoulder decreased ROM: placed referral to PT for evaluation.    4. Bone health:  Given [name] [name]'s age/history of breast cancer, she is at risk for bone demineralization.  Dr. Jenny Reichmann follows this.  She was given education on specific activities to promote bone health.  5. Cancer screening:  Due to [name] [name]'s history and her age, she should receive screening for skin cancers, colon cancer, and gynecologic cancers.  The information and recommendations are listed on the patient's comprehensive care plan/treatment summary and were reviewed in detail with the patient.    6. Health maintenance and wellness promotion: [name] [name] was encouraged to consume 5-7 servings of fruits and vegetables per day. We reviewed the "Nutrition Rainbow" handout, as well as the handout "Take Control of Your Health and Reduce Your Cancer Risk" from the [employer].  She was also encouraged to engage in moderate to vigorous  exercise for 30 minutes per day most days of the week. We discussed the [employer] fitness program, which is designed for cancer survivors to help them become more physically fit after cancer treatments.  She was instructed to limit her alcohol consumption and continue to abstain from tobacco use.   7. Support services/counseling: It is not uncommon for this period of the patient's cancer care trajectory to be one of many emotions and stressors.  We discussed an opportunity for her to participate in the next session of [facility] ("Finding Your New Normal") support group series designed  for patients after they have completed treatment.   [name] [name] was encouraged to take advantage of our many other support services programs, support groups, and/or counseling in coping with her new life as a cancer survivor after completing anti-cancer treatment.  She was offered support today through active listening and expressive supportive counseling.  She was given information regarding our available services and encouraged to contact me with any questions or for help enrolling in any of our support group/programs.    Dispo:   -Return to cancer center to see Dr. Lindi Adie in 05/2020  -Mammogram due in 10/2019 -Follow up with surgery 11/2019 -B12 injections every 4 weeks; CBC and b12 level in 12 weeks -referral to PT placed -She is welcome to return back to the Survivorship Clinic at any time; no additional follow-up needed at this time.  -Consider referral back to survivorship as a long-term survivor for continued surveillance  A total of (40) minutes of face-to-face time was spent with this patient with greater than 50% of that time in counseling and care-coordination.   Gardenia Phlegm, [place] [phone]   Note: PRIMARY CARE PROVIDER Biagio Borg, [place] [phone]

## 2019-07-05 ENCOUNTER — Other Ambulatory Visit: Payer: Self-pay | Admitting: Hematology and Oncology

## 2019-07-05 ENCOUNTER — Other Ambulatory Visit: Payer: Self-pay | Admitting: Internal Medicine

## 2019-07-05 ENCOUNTER — Other Ambulatory Visit: Payer: Self-pay | Admitting: Cardiology

## 2019-07-05 DIAGNOSIS — C50511 Malignant neoplasm of lower-outer quadrant of right female breast: Secondary | ICD-10-CM

## 2019-07-05 DIAGNOSIS — I428 Other cardiomyopathies: Secondary | ICD-10-CM

## 2019-07-07 NOTE — Telephone Encounter (Signed)
Per routine office policy  All routine meds ok for 1 yr, then month to month only for 3 mo until refused after, thanks

## 2019-07-08 MED ORDER — TRAMADOL HCL 50 MG PO TABS: 50.0000 mg | ORAL_TABLET | Freq: Four times a day (QID) | ORAL | 2 refills | Status: DC | PRN

## 2019-07-08 NOTE — Telephone Encounter (Signed)
Pt is requesting refill on her TraMADol HCl 50 MG TAKE 1 TABLET BY MOUTH EVERY 6 HOURS AS NEEDED Med is not on med list pls advise.Marland KitchenJohny Chess

## 2019-07-08 NOTE — Telephone Encounter (Signed)
Done erx 

## 2019-07-15 ENCOUNTER — Ambulatory Visit: Payer: Medicare Other

## 2019-07-22 ENCOUNTER — Other Ambulatory Visit: Payer: Self-pay

## 2019-07-22 ENCOUNTER — Ambulatory Visit: Payer: Medicare Other | Attending: Adult Health | Admitting: Physical Therapy

## 2019-07-22 DIAGNOSIS — M25611 Stiffness of right shoulder, not elsewhere classified: Secondary | ICD-10-CM | POA: Diagnosis not present

## 2019-07-22 DIAGNOSIS — M6281 Muscle weakness (generalized): Secondary | ICD-10-CM

## 2019-07-22 DIAGNOSIS — M25511 Pain in right shoulder: Secondary | ICD-10-CM | POA: Diagnosis not present

## 2019-07-22 DIAGNOSIS — M25561 Pain in right knee: Secondary | ICD-10-CM | POA: Diagnosis not present

## 2019-07-22 DIAGNOSIS — M25512 Pain in left shoulder: Secondary | ICD-10-CM | POA: Insufficient documentation

## 2019-07-22 DIAGNOSIS — R262 Difficulty in walking, not elsewhere classified: Secondary | ICD-10-CM | POA: Insufficient documentation

## 2019-07-22 DIAGNOSIS — M25612 Stiffness of left shoulder, not elsewhere classified: Secondary | ICD-10-CM | POA: Diagnosis not present

## 2019-07-22 DIAGNOSIS — I890 Lymphedema, not elsewhere classified: Secondary | ICD-10-CM | POA: Diagnosis not present

## 2019-07-22 NOTE — Patient Instructions (Signed)
First of all, check with your insurance company to see if provider is in network    [facility] (for wigs and compression sleeves / gloves/gauntlets )  [address] [place], [place] [address] [phone]  Will file some insurances --- call for appointment   [facility] (for mastectomy prosthetics and garments) [address] [place], [place] [address] [phone] Will file some insurances --- call for appointment  [facility]  [address]  [place], [place] [address] [phone] Lower extremity garments  [facility] [address] [place], [place]  [address] [phone]  Cathy Rubel ( Medicaid certified lymphedema fitter) [phone] [email or web address]  [name] Meares  [facility]  [phone]  [facility] [address] [place], [place] [address] [phone]  Other Resources: National Lymphedema Network:  www.lymphnet.org www.Klosetraining.com for patient articles and self manual lymph drainage information www.lymphedemablog.com has informative articles.  www.compressionguru.com www.lymphedemaproducts.com www.brightlifedirect.com www.compressionguru.com 

## 2019-07-22 NOTE — Therapy (Signed)
[place], [place], [address] Phone: [phone]   Fax:  [phone]  Physical Therapy Evaluation  Patient Details  Name: [name] [name] MRN: [id number] Date of Birth: Oct 04, 1940 Referring Provider (PT): Wilber Bihari    Encounter Date: 07/22/2019  PT End of Session - 07/22/19 1211    Visit Number  1    Number of Visits  9    Date for PT Re-Evaluation  08/22/19    PT Start Time  1100    PT Stop Time  1145    PT Time Calculation (min)  45 min    Activity Tolerance  Patient limited by pain    Behavior During Therapy  [facility] for tasks assessed/performed       Past Medical History:  Diagnosis Date  . AICD (automatic cardioverter/defibrillator) present   . ALLERGIC RHINITIS 11/06/2007  . 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 ANEMIA-NOS 06/17/2008  . ANXIETY 02/22/2008  . CARDIOMYOPATHY, PRIMARY, DILATED 12/30/2008  . [place] DISEASE, 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  . GERD 05/07/2007  . History of kidney stones   . HYPERLIPIDEMIA 05/23/2007  . HYPERTENSION 05/07/2007  . 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 MENOPAUSAL DISORDER 05/06/2008  . 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 OVERACTIVE BLADDER 08/03/2010  . PYELONEPHRITIS, HX OF 05/07/2007  . SYSTOLIC HEART FAILURE, CHRONIC 12/28/2008  . VITAMIN D DEFICIENCY 06/17/2008    Past Surgical History:  Procedure Laterality Date  . ABDOMINAL HYSTERECTOMY    . BREAST LUMPECTOMY WITH RADIOACTIVE SEED AND SENTINEL LYMPH NODE BIOPSY Right 10/11/2018   Procedure: RIGHT BREAST LUMPECTOMY WITH RADIOACTIVE SEED AND SENTINEL LYMPH NODE BIOPSY;  Surgeon: Stark Klein, MD;  Location: [facility];  Service: General;  Laterality: Right;  . ICD GENERATOR CHANGEOUT N/A 10/25/2017   Procedure: ICD GENERATOR CHANGEOUT;  Surgeon: Deboraha Sprang, MD;  Location: [facility];  Service: Cardiovascular;  Laterality: N/A;  . LAMINECTOMY    . PORT-A-CATH REMOVAL N/A  05/08/2019   Procedure: REMOVAL OF PORT-A-CATH RIGHT CHEST;  Surgeon: Stark Klein, MD;  Location: [facility];  Service: General;  Laterality: N/A;  . PORTACATH PLACEMENT N/A 10/11/2018   Procedure: INSERTION PORT-A-CATH;  Surgeon: Stark Klein, MD;  Location: [facility];  Service: General;  Laterality: N/A;  . RE-EXCISION OF BREAST LUMPECTOMY Right 10/23/2018   Procedure: RE-EXCISION OF RIGHT BREAST LUMPECTOMY;  Surgeon: Stark Klein, MD;  Location: [facility];  Service: General;  Laterality: Right;    There were no vitals filed for this visit.   Subjective Assessment - 07/22/19 1110    Subjective  Pt is having problems with pain in her both knees and both shoulders.  She has lots of problems with fatigue and is upset that she can't do the things she used to do before cancer treatent . She also has numbess and swelling under her right arm. 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 It measures 1.6 cm and is located in the lower outer quadrant with a Ki67 of 80%. She has a cardiac defibrillator.She had a right lumpectomy on 10/11/2018 with 0/3 nodes positive, and had to go back a second time on 10/23/2018 for clear margins,. She had chemo grom 11/15/2018-03/20/2019 followed by raidation from 9/15- 04/19/2019    Patient Stated Goals  to get back to her old self again    Currently in Pain?  No/denies  using lidocaine issued for  her port on her shoulders and knees   Pain Score  --   without the meds she says her pain is about a 12        [facility] PT Assessment - 07/22/19 0001      Assessment   Medical Diagnosis  Right breast cancer    Referring Provider (PT)  Wilber Bihari     Onset Date/Surgical Date  08/15/18    Hand Dominance  Right    Prior Therapy  none      Precautions   Precautions  Other (comment)    Precaution Comments  active cancer; cardiac defibrillator      Restrictions    Weight Bearing Restrictions  No      Balance Screen   Has the patient fallen in the past 6 months  No    Has the patient had a decrease in activity level because of a fear of falling?   Yes   from the pain    Is the patient reluctant to leave their home because of a fear of falling?   No      Home Social worker  Private residence    Living Arrangements  Alone    Available Help at Discharge  Family      Prior Function   Level of [place]  Retired    Leisure  She does 10 minutes a day on the treadmill and has stairs that are straight up in her house      Cognition   Overall Cognitive Status  Within Functional Limits for tasks assessed      Observation/Other Assessments   Other Surveys   Quick Dash    Quick DASH   75      Sensation   Additional Comments  pt reports she has numbness in her right axilla       Coordination   Gross Motor Movements are Fluid and Coordinated  No   limited by pain      Functional Tests   Functional tests  Sit to Stand      Sit to Stand   Comments  3 repetitions in 30 seconds pushing off with hands and pain in knees 10/10       Posture/Postural Control   Posture/Postural Control  Postural limitations    Postural Limitations  Rounded Shoulders;Forward head;Increased lumbar lordosis      ROM / Strength   AROM / PROM / Strength  AROM;Strength      AROM   Overall AROM   Deficits    Overall AROM Comments  very limited by pain in both shoulders and knees     Right Shoulder Extension  --    Right Shoulder Flexion  110 Degrees    Right Shoulder ABduction  115 Degrees    Right Shoulder Internal Rotation  --    Right Shoulder External Rotation  80 Degrees    Left Shoulder Extension  --    Left Shoulder Flexion  115 Degrees    Left Shoulder ABduction  115 Degrees   defibrillator in chest and painful    Left Shoulder Internal Rotation  --    Left Shoulder External Rotation  80 Degrees    Cervical  Flexion  --    Cervical Extension  --    Cervical - Right Side Bend  --    Cervical - Left Side Bend  --    Cervical -  Right Rotation  --    Cervical - Left Rotation  --      Strength   Overall Strength  Deficits    Overall Strength Comments  generalized atrophy, very slow movements with increased pain limiting pain in all joints to about 3-/5 of AROM with very slow movements raising extremity against gravity.  Right knee is boggy with crepitus felt knee extension     Strength Assessment Site  --      Palpation   Palpation comment  fullness at right axilla and pt reports that right breast feels heavy.  Sh does not usually wear a bra as she finds it is uncomfortable at her lateral breast  tight muscles at neck upper traps and deltiods, boggy right knee joint       Bed Mobility   Bed Mobility  --    Supine to Sit  --    Sit to Supine  --      Transfers   Transfers  Sit to Stand;Stand to Sit;Supine to Sit;Sit to Supine    Sit to Stand  6: Modified independent (Device/Increase time)    Stand to Sit  6: Modified independent (Device/Increase time)    Supine to Sit  6: Modified independent (Device/Increase time)    Sit to Supine  6: Modified independent (Device/Increase time)    Comments  increased time needed for all transitional movments with pt grunting in pain and pushing up with arms       Ambulation/Gait   Ambulation/Gait  Yes    Gait Comments  pt walks without a device though she says she uses a cane on her stairs at home. She walks with  slowly with legs stiff       Standardized Balance Assessment   Standardized Balance Assessment  Timed Up and Go Test      Timed Up and Go Test   TUG  Normal TUG    Normal TUG (seconds)  14.23        LYMPHEDEMA/ONCOLOGY QUESTIONNAIRE - 07/22/19 1207      Type   Cancer Type  right breast cancer       Surgeries   Lumpectomy Date  10/11/18   return to get clear margins on 10/23/2018   Other Surgery Date  10/23/18    Number Lymph Nodes  Removed  3      Treatment   Past Chemotherapy Treatment  Yes    Date  03/20/19    Past Radiation Treatment  Yes    Date  04/19/19      What other symptoms do you have   Are you Having Heaviness or Tightness  Yes   right breast    Are you having Pain  Yes   shoulders    Are you having pitting edema  No    Is it Hard or Difficult finding clothes that fit  No    Do you have infections  No    Is there Decreased scar mobility  No    [place]mer Sign  No      Right Upper Extremity Lymphedema   10 cm Proximal to Olecranon Process  35.2 cm    Olecranon Process  27.5 cm    15 cm Proximal to Ulnar Styloid Process  26.2 cm    10 cm Proximal to Ulnar Styloid Process  22.8 cm    Just Proximal to Ulnar Styloid Process  17 cm    Across Hand at [employer]  22 cm  At [facility] of 2nd Digit  7 cm      Left Upper Extremity Lymphedema   10 cm Proximal to Olecranon Process  34 cm    Olecranon Process  28 cm    15 cm Proximal to Ulnar Styloid Process  26.5 cm    10 cm Proximal to Ulnar Styloid Process  22.5 cm    Just Proximal to Ulnar Styloid Process  16.8 cm    Across Hand at [employer]  21 cm    At [place] of 2nd Digit  7 cm          Quick Dash - 07/22/19 0001    Open a tight or new jar  Mild difficulty    Do heavy household chores (wash walls, wash floors)  Severe difficulty    Carry a shopping bag or briefcase  Severe difficulty    Wash your back  Unable    Use a knife to cut food  Severe difficulty    Recreational activities in which you take some force or impact through your arm, shoulder, or hand (golf, hammering, tennis)  Unable    During the past week, to what extent has your arm, shoulder or hand problem interfered with your normal social activities with family, friends, neighbors, or groups?  Extremely    During the past week, to what extent has your arm, shoulder or hand problem limited your work or other regular daily activities  Quite a bit    Arm, shoulder, or hand pain.   Severe    Tingling (pins and needles) in your arm, shoulder, or hand  Moderate    Difficulty Sleeping  Severe difficulty    DASH Score  75 %        Objective measurements completed on examination: See above findings.      [facility] Adult PT Treatment/Exercise - 07/22/19 0001      Self-Care   Self-Care  Other Self-Care Comments    Other Self-Care Comments   talked with pt about a compression bra and she was given information about [facility] - 07/22/19 1230      PT LONG TERM GOAL #1   Title  Pt will verbalize lymphedema risk reduction precautions and be able to manage her right breast lymphedema with self MLD and compression    Time  4    Period  Weeks    Status  New      PT LONG TERM GOAL #2   Title  Pt will be independent in a home program for generalized ROM and strength    Time  4    Period  Weeks    Status  New      PT LONG TERM GOAL #3   Title  Pt will decrease Quick DASH to less than 50 indicating a functional improvment of upper extremities    Baseline  75 on 07/22/2019    Time  4    Period  Weeks    Status  New      PT LONG TERM GOAL #4   Title  Pt will increase number of reps of sit to standin 30 sec. to > 5 indicating an improvment in lower extremity function and general strength    Baseline  3 in 30 sec on 07/22/2019    Time  4    Period  Weeks  PT LONG TERM GOAL #5   Title  Pt will decrease TUG to < 13 seconds indicating and improvment in functional mobilty    Baseline  14.23 sec on 1/11/ 2021    Time  4    Period  Weeks    Status  New             Plan - 07/22/19 1215    Clinical Impression Statement  Pt comes into PT markedly limited by pain in knees and shoulders and also  by fatigue.  She is unable to raise her arms or lift her legs much due to pain and has crepitus in her right knee. Her family doctor told her she had arthritis but did not take xrays.  She saw an orthopedic doctor in the  past and will consider calling him again for advice about her knees and shoulders.  She reports heaviness in her right breast and does not have a bra that fits her well.  She would benefit from a compression bra from [facility] and a script request was sent to [employer]. She does not have lymphedema in her arm.  She would benefit from PT to instruct in self MLD to the right breast and gentle exercise program for ROM and progressive strength to help with functional mobility    Personal Factors and Comorbidities  Age;Comorbidity 3+    Comorbidities  cancer surgery, chemo, radiation, kidney disease, defibrillator    Examination-Activity Limitations  Bathing;Dressing;Bed Mobility;Bend;Lift;Squat;Hygiene/Grooming;Risk analyst;Yard Work;Volunteer;Shop;Community Activity    Stability/Clinical Decision Making  Stable/Uncomplicated    Clinical Decision Making  Low    Rehab Potential  Good    Clinical Impairments Affecting Rehab Potential  pain    PT Frequency  2x / week    PT Duration  4 weeks    PT Treatment/Interventions  ADLs/Self Care Home Management;Patient/family education;Therapeutic exercise;Aquatic Therapy;DME Instruction;Therapeutic activities;Functional mobility training;Orthotic Fit/Training;Manual techniques;Manual lymph drainage    PT Next Visit Plan  Teach self MLD to right breast and see if pt got appt for [facility], begin dowel exercise and shoulder ROM gentle quad exercise and ROM    Recommended Other Services  inbasket sent to Wilber Bihari to fax a script to [facility] for compression bra    Consulted and Agree with Plan of Care  Patient       Patient will benefit from skilled therapeutic intervention in order to improve the following deficits and impairments:  Decreased range of motion, Impaired UE functional use, Decreased knowledge of precautions, Postural dysfunction, Pain, Abnormal gait,  Decreased balance, Decreased endurance, Difficulty walking, Increased edema, Decreased activity tolerance, Decreased strength  Visit Diagnosis: Acute pain of right shoulder - Plan: PT plan of care cert/re-cert  Stiffness of right shoulder joint - Plan: PT plan of care cert/re-cert  Acute pain of left shoulder - Plan: PT plan of care cert/re-cert  Stiffness of left shoulder joint - Plan: PT plan of care cert/re-cert  Acute pain of right knee - Plan: PT plan of care cert/re-cert  Muscle weakness (generalized) - Plan: PT plan of care cert/re-cert  Difficulty in walking, not elsewhere classified - Plan: PT plan of care cert/re-cert  Lymphedema, not elsewhere classified - Plan: PT plan of care cert/re-cert     Problem List Patient Active Problem List   Diagnosis Date Noted  . CKD (chronic kidney disease) stage 3, GFR 30-59 ml/min 05/31/2019  . Port-A-Cath in place 11/15/2018  .  Malignant neoplasm of lower-outer quadrant of right breast of female, estrogen receptor negative ([place]) 09/03/2018  . Wheezing 10/03/2017  . Left lumbar radiculopathy 05/16/2017  . Rotator cuff tear, right 03/23/2017  . AC joint arthropathy 02/25/2017  . Fatigue 05/12/2016  . Migraine 10/07/2015  . PVC's (premature ventricular contractions) 07/23/2014  . Obesity 02/10/2013  . Right lumbar radiculopathy 09/24/2012  . Hearing loss of right ear 08/10/2012  . Automatic implantable cardioverter-defibrillator . [employer] 11/07/2011  . Preventative health care 12/29/2010  . Gross hematuria 12/29/2010  . Unspecified eustachian tube disorder 08/03/2010  . OVERACTIVE BLADDER 08/03/2010  . 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 Cardiomyopathy, nonischemic ([place]) 12/30/2008  . SYSTOLIC HEART FAILURE, CHRONIC 12/28/2008  . VITAMIN D DEFICIENCY 06/17/2008  . ANEMIA-NOS 06/17/2008  . MENOPAUSAL DISORDER 05/06/2008  . ANXIETY 02/22/2008  . Allergic rhinitis 11/06/2007  . Hyperlipidemia 05/23/2007  . ANEMIA-IRON  DEFICIENCY 05/23/2007  . Osteoarthrosis involving lower leg 05/23/2007  . Essential hypertension 05/07/2007  . 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 GERD 05/07/2007  . [place] DISEASE, 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  . PYELONEPHRITIS, HX OF 05/07/2007  . HYSTERECTOMY, HX OF 05/07/2007  . LAMINECTOMY, HX OF 05/07/2007   Donato Heinz. Owens Shark PT  [place] 07/22/2019, 12:43 PM  [address], [place], [address] Phone: [phone]   Fax:  [phone]  Name: [name] [name] MRN: [id number] Date of Birth: [birth year]

## 2019-07-25 ENCOUNTER — Telehealth: Payer: Self-pay | Admitting: Hematology and Oncology

## 2019-07-25 NOTE — Telephone Encounter (Signed)
Returned patient's phone call regarding rescheduling 01/18 appointment, per patient's request appointment has moved to 01/22.

## 2019-07-29 ENCOUNTER — Inpatient Hospital Stay: Payer: Medicare Other

## 2019-08-02 ENCOUNTER — Inpatient Hospital Stay: Payer: Medicare Other | Attending: Hematology and Oncology

## 2019-08-02 ENCOUNTER — Other Ambulatory Visit: Payer: Self-pay

## 2019-08-02 VITALS — BP 105/51 | HR 83 | Temp 98.3°F | Resp 16

## 2019-08-02 DIAGNOSIS — Z170 Estrogen receptor positive status [ER+]: Secondary | ICD-10-CM | POA: Insufficient documentation

## 2019-08-02 DIAGNOSIS — E538 Deficiency of other specified B group vitamins: Secondary | ICD-10-CM | POA: Diagnosis not present

## 2019-08-02 DIAGNOSIS — C50511 Malignant neoplasm of lower-outer quadrant of right female breast: Secondary | ICD-10-CM | POA: Insufficient documentation

## 2019-08-02 DIAGNOSIS — Z79899 Other long term (current) drug therapy: Secondary | ICD-10-CM | POA: Diagnosis not present

## 2019-08-02 DIAGNOSIS — R5383 Other fatigue: Secondary | ICD-10-CM

## 2019-08-02 MED ORDER — CYANOCOBALAMIN 1000 MCG/ML IJ SOLN
INTRAMUSCULAR | Status: AC
  Filled 2019-08-02: qty 1

## 2019-08-02 MED ORDER — CYANOCOBALAMIN 1000 MCG/ML IJ SOLN
1000.0000 ug | Freq: Once | INTRAMUSCULAR | Status: AC
  Administered 2019-08-02: 1000 ug via INTRAMUSCULAR

## 2019-08-02 NOTE — Patient Instructions (Signed)

## 2019-08-06 ENCOUNTER — Ambulatory Visit (INDEPENDENT_AMBULATORY_CARE_PROVIDER_SITE_OTHER): Payer: Medicare Other | Admitting: *Deleted

## 2019-08-06 DIAGNOSIS — I428 Other cardiomyopathies: Secondary | ICD-10-CM | POA: Diagnosis not present

## 2019-08-07 LAB — CUP PACEART REMOTE DEVICE CHECK
Battery Remaining Longevity: 120 mo
Battery Voltage: 3.02 V
Brady Statistic RV Percent Paced: 0.01 %
Date Time Interrogation Session: 20210126033525
HighPow Impedance: 38 Ohm
HighPow Impedance: 47 Ohm
Implantable Lead Implant Date: 20081215
Implantable Lead Location: 753860
Implantable Lead Model: 6947
Implantable Pulse Generator Implant Date: 20190418
Lead Channel Impedance Value: 342 Ohm
Lead Channel Impedance Value: 399 Ohm
Lead Channel Pacing Threshold Amplitude: 0.75 V
Lead Channel Pacing Threshold Pulse Width: 0.4 ms
Lead Channel Sensing Intrinsic Amplitude: 17.625 mV
Lead Channel Sensing Intrinsic Amplitude: 17.625 mV
Lead Channel Setting Pacing Amplitude: 2.5 V
Lead Channel Setting Pacing Pulse Width: 0.4 ms
Lead Channel Setting Sensing Sensitivity: 0.3 mV

## 2019-08-12 ENCOUNTER — Ambulatory Visit: Payer: Medicare Other | Admitting: Cardiology

## 2019-08-13 ENCOUNTER — Telehealth: Payer: Self-pay | Admitting: Hematology and Oncology

## 2019-08-13 NOTE — Telephone Encounter (Signed)
Rescheduled per 2/1 sch msg, pt req. Called and spoke with pt, confirmed 2/19 appt

## 2019-08-21 NOTE — Progress Notes (Deleted)
HPI: FU nonischemic cardiomyopathy.Cardiac catheterization August 2004 showed normal coronary arteries.She had an ICD placed on June 25, 2007. 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 Patient was referred to Dr. Lovena Le by Dr. Caryl Comes for consideration of ablation but Dr. Lovena Le felt medical therapy indicated.Nuclear study repeated April 2018 and showed ejection fraction 35% with no ischemia or infarction.Echocardiogram March 2020 showed ejection fraction 20 to 25%, severe left ventricular enlargement, grade 2 diastolic dysfunction, mild left atrial enlargement, mild to moderate mitral regurgitation. Since I last saw her,  Current Outpatient Medications  Medication Sig Dispense Refill  . albuterol (PROVENTIL HFA;VENTOLIN HFA) 108 (90 Base) MCG/ACT inhaler Inhale 1 puff into the lungs every 6 (six) hours as needed for wheezing or shortness of breath.    . carvedilol (COREG) 25 MG tablet TAKE 1 TABLET BY MOUTH TWICE A DAY 180 tablet 1  . cholecalciferol (VITAMIN D3) 25 MCG (1000 UT) tablet Take 1,000 Units by mouth daily.    [name] ENTRESTO 24-26 MG TAKE 1 TABLET BY MOUTH 2 (TWO) TIMES DAILY. 60 tablet 2  . estradiol (ESTRACE) 1 MG tablet TAKE 1 TABLET BY MOUTH EVERY DAY (Patient not taking: Reported on 07/22/2019) 90 tablet 2  . furosemide (LASIX) 40 MG tablet TAKE 1 TABLET BY MOUTH  TWICE DAILY 180 tablet 3  . ibuprofen (ADVIL) 800 MG tablet TAKE 1 TABLET (800 MG TOTAL) EVERY 8 (EIGHT) HOURS AS NEEDED BY MOUTH. 40 tablet 2  . meloxicam (MOBIC) 15 MG tablet TAKE 1 TABLET BY MOUTH EVERY DAY AS NEEDED FOR PAIN 90 tablet 3  . Multiple Vitamin (MULTIVITAMIN WITH MINERALS) TABS tablet Take 1 tablet by mouth daily.    . rizatriptan (MAXALT) 10 MG tablet Take 10 mg by mouth as needed for migraine. May repeat in 2 hours if needed    . spironolactone (ALDACTONE) 25 MG tablet Take 0.5 tablets (12.5 mg total) by mouth daily. 45 tablet 3  . traMADol (ULTRAM) 50 MG tablet  Take 1 tablet (50 mg total) by mouth every 6 (six) hours as needed. 60 tablet 2   No current facility-administered medications for this visit.     Past Medical History:  Diagnosis Date  . AICD (automatic cardioverter/defibrillator) present   . ALLERGIC RHINITIS 11/06/2007  . 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 ANEMIA-NOS 06/17/2008  . ANXIETY 02/22/2008  . CARDIOMYOPATHY, PRIMARY, DILATED 12/30/2008  . [place] DISEASE, 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  . GERD 05/07/2007  . History of kidney stones   . HYPERLIPIDEMIA 05/23/2007  . HYPERTENSION 05/07/2007  . 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 MENOPAUSAL DISORDER 05/06/2008  . 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 OVERACTIVE BLADDER 08/03/2010  . PYELONEPHRITIS, HX OF 05/07/2007  . SYSTOLIC HEART FAILURE, CHRONIC 12/28/2008  . VITAMIN D DEFICIENCY 06/17/2008    Past Surgical History:  Procedure Laterality Date  . ABDOMINAL HYSTERECTOMY    . BREAST LUMPECTOMY WITH RADIOACTIVE SEED AND SENTINEL LYMPH NODE BIOPSY Right 10/11/2018   Procedure: RIGHT BREAST LUMPECTOMY WITH RADIOACTIVE SEED AND SENTINEL LYMPH NODE BIOPSY;  Surgeon: Stark Klein, MD;  Location: [facility];  Service: General;  Laterality: Right;  . ICD GENERATOR CHANGEOUT N/A 10/25/2017   Procedure: ICD GENERATOR CHANGEOUT;  Surgeon: Deboraha Sprang, MD;  Location: [facility];  Service: Cardiovascular;  Laterality: N/A;  . LAMINECTOMY    . PORT-A-CATH REMOVAL N/A 05/08/2019   Procedure: REMOVAL OF PORT-A-CATH RIGHT CHEST;  Surgeon: Stark Klein, MD;  Location: [facility];  Service: General;  Laterality: N/A;  . PORTACATH PLACEMENT N/A 10/11/2018   Procedure: INSERTION PORT-A-CATH;  Surgeon: Stark Klein, MD;  Location: [facility];  Service: General;  Laterality: N/A;  . RE-EXCISION OF BREAST LUMPECTOMY Right 10/23/2018   Procedure: RE-EXCISION OF RIGHT BREAST LUMPECTOMY;  Surgeon: Stark Klein, MD;  Location: [facility];  Service: General;  Laterality: Right;    Social History    Socioeconomic History  . Marital status: Widowed    Spouse name: Not on file  . Number of children: 3  . Years of education: Not on file  . Highest education level: Not on file  Occupational History  . Occupation: retired Ambulance person: RETIRED  Tobacco Use  . Smoking status: Never Smoker  . Smokeless tobacco: Never Used  Substance and Sexual Activity  . Alcohol use: No  . Drug use: No  . Sexual activity: Never  Other Topics Concern  . Not on file  Social History Narrative  . Not on file   Social Determinants of Health   Financial Resource Strain:   . Difficulty of Paying Living Expenses: Not on file  Food Insecurity:   . Worried About Charity fundraiser in the Last Year: Not on file  . Ran Out of Food in the Last Year: Not on file  Transportation Needs: No Transportation Needs  . Lack of Transportation (Medical): No  . Lack of Transportation (Non-Medical): No  Physical Activity:   . Days of Exercise per Week: Not on file  . Minutes of Exercise per Session: Not on file  Stress:   . Feeling of Stress : Not on file  Social Connections:   . Frequency of Communication with Friends and Family: Not on file  . Frequency of Social Gatherings with Friends and Family: Not on file  . Attends Religious Services: Not on file  . Active Member of Clubs or Organizations: Not on file  . Attends Archivist Meetings: Not on file  . Marital Status: Not on file  Intimate Partner Violence: Not At Risk  . Fear of Current or Ex-Partner: No  . Emotionally Abused: No  . Physically Abused: No  . Sexually Abused: No    Family History  Problem Relation Age of Onset  . Arthritis Other   . Coronary artery disease Other   . Kidney disease Other   . Heart disease Mother     ROS: no fevers or chills, productive cough, hemoptysis, dysphasia, odynophagia, melena, hematochezia, dysuria, hematuria, rash, seizure activity, orthopnea, PND, pedal edema, claudication.  Remaining systems are negative.  Physical Exam: Well-developed well-nourished in no acute distress.  Skin is warm and dry.  HEENT is normal.  Neck is supple.  Chest is clear to auscultation with normal expansion.  Cardiovascular exam is regular rate and rhythm.  Abdominal exam nontender or distended. No masses palpated. Extremities show no edema. neuro grossly intact  ECG- personally reviewed  A/P  1 nonischemic cardiomyopathy-continue Entresto, beta-blocker and spironolactone.  2 chronic systolic congestive heart failure-volume status appears to be stable today.  Continue diuretic at present dose.  Continue fluid restriction and low-sodium diet.  Check potassium and renal function.  3 hypertension-blood pressure controlled.  Continue present medical regimen.  4 hyperlipidemia-continue statin.  5 prior ICD-Per EP.  6 PVCs-continue beta-blocker at present dose.  Previously seen by Dr. Lovena Le and ablation felt not indicated.  Kirk Ruths, MD

## 2019-08-26 ENCOUNTER — Ambulatory Visit: Payer: Medicare Other

## 2019-08-30 ENCOUNTER — Inpatient Hospital Stay: Payer: Medicare Other | Attending: Hematology and Oncology

## 2019-08-30 ENCOUNTER — Ambulatory Visit: Payer: Medicare Other | Admitting: Cardiology

## 2019-09-19 ENCOUNTER — Encounter: Payer: Self-pay | Admitting: Adult Health

## 2019-09-19 ENCOUNTER — Telehealth: Payer: Self-pay | Admitting: Hematology and Oncology

## 2019-09-19 DIAGNOSIS — R928 Other abnormal and inconclusive findings on diagnostic imaging of breast: Secondary | ICD-10-CM | POA: Diagnosis not present

## 2019-09-19 LAB — HM MAMMOGRAPHY

## 2019-09-19 NOTE — Telephone Encounter (Signed)
R/s appt per 3/11 sch message - left message with new appt date and time

## 2019-09-23 ENCOUNTER — Other Ambulatory Visit: Payer: Medicare Other

## 2019-09-23 ENCOUNTER — Ambulatory Visit: Payer: Medicare Other

## 2019-09-27 ENCOUNTER — Inpatient Hospital Stay: Payer: Medicare Other

## 2019-10-09 ENCOUNTER — Other Ambulatory Visit: Payer: Self-pay | Admitting: Cardiology

## 2019-10-09 DIAGNOSIS — I428 Other cardiomyopathies: Secondary | ICD-10-CM

## 2019-10-16 ENCOUNTER — Ambulatory Visit: Payer: Medicare Other | Admitting: Internal Medicine

## 2019-10-29 ENCOUNTER — Encounter: Payer: Self-pay | Admitting: Internal Medicine

## 2019-10-29 ENCOUNTER — Ambulatory Visit (INDEPENDENT_AMBULATORY_CARE_PROVIDER_SITE_OTHER): Payer: Medicare Other | Admitting: Internal Medicine

## 2019-10-29 ENCOUNTER — Other Ambulatory Visit: Payer: Self-pay | Admitting: Internal Medicine

## 2019-10-29 ENCOUNTER — Other Ambulatory Visit: Payer: Self-pay

## 2019-10-29 VITALS — BP 110/68 | HR 80 | Temp 98.1°F | Ht 67.0 in | Wt 224.0 lb

## 2019-10-29 DIAGNOSIS — D509 Iron deficiency anemia, unspecified: Secondary | ICD-10-CM

## 2019-10-29 DIAGNOSIS — L0292 Furuncle, unspecified: Secondary | ICD-10-CM | POA: Diagnosis not present

## 2019-10-29 DIAGNOSIS — E538 Deficiency of other specified B group vitamins: Secondary | ICD-10-CM

## 2019-10-29 DIAGNOSIS — R739 Hyperglycemia, unspecified: Secondary | ICD-10-CM

## 2019-10-29 DIAGNOSIS — Z0001 Encounter for general adult medical examination with abnormal findings: Secondary | ICD-10-CM

## 2019-10-29 DIAGNOSIS — N183 Chronic kidney disease, stage 3 unspecified: Secondary | ICD-10-CM | POA: Diagnosis not present

## 2019-10-29 DIAGNOSIS — E559 Vitamin D deficiency, unspecified: Secondary | ICD-10-CM

## 2019-10-29 DIAGNOSIS — Z0000 Encounter for general adult medical examination without abnormal findings: Secondary | ICD-10-CM

## 2019-10-29 LAB — CBC WITH DIFFERENTIAL/PLATELET
Basophils Absolute: 0 10*3/uL (ref 0.0–0.1)
Basophils Relative: 0.6 % (ref 0.0–3.0)
Eosinophils Absolute: 0.1 10*3/uL (ref 0.0–0.7)
Eosinophils Relative: 2.8 % (ref 0.0–5.0)
HCT: 35.2 % — ABNORMAL LOW (ref 36.0–46.0)
Hemoglobin: 11.2 g/dL — ABNORMAL LOW (ref 12.0–15.0)
Lymphocytes Relative: 38.5 % (ref 12.0–46.0)
Lymphs Abs: 1.8 10*3/uL (ref 0.7–4.0)
MCHC: 31.9 g/dL (ref 30.0–36.0)
MCV: 82.8 fl (ref 78.0–100.0)
Monocytes Absolute: 0.4 10*3/uL (ref 0.1–1.0)
Monocytes Relative: 7.9 % (ref 3.0–12.0)
Neutro Abs: 2.4 10*3/uL (ref 1.4–7.7)
Neutrophils Relative %: 50.2 % (ref 43.0–77.0)
Platelets: 199 10*3/uL (ref 150.0–400.0)
RBC: 4.25 Mil/uL (ref 3.87–5.11)
RDW: 16.8 % — ABNORMAL HIGH (ref 11.5–15.5)
WBC: 4.7 10*3/uL (ref 4.0–10.5)

## 2019-10-29 LAB — URINALYSIS, ROUTINE W REFLEX MICROSCOPIC
Bilirubin Urine: NEGATIVE
Hgb urine dipstick: NEGATIVE
Ketones, ur: NEGATIVE
Nitrite: NEGATIVE
Specific Gravity, Urine: 1.015 (ref 1.000–1.030)
Urine Glucose: NEGATIVE
Urobilinogen, UA: 0.2 (ref 0.0–1.0)
pH: 7.5 (ref 5.0–8.0)

## 2019-10-29 LAB — BASIC METABOLIC PANEL
BUN: 18 mg/dL (ref 6–23)
CO2: 30 mEq/L (ref 19–32)
Calcium: 9.5 mg/dL (ref 8.4–10.5)
Chloride: 105 mEq/L (ref 96–112)
Creatinine, Ser: 1.41 mg/dL — ABNORMAL HIGH (ref 0.40–1.20)
GFR: 43.77 mL/min — ABNORMAL LOW (ref >60.00)
Glucose, Bld: 115 mg/dL — ABNORMAL HIGH (ref 70–99)
Potassium: 5.4 mEq/L — ABNORMAL HIGH (ref 3.5–5.1)
Sodium: 142 mEq/L (ref 135–145)

## 2019-10-29 LAB — HEPATIC FUNCTION PANEL
ALT: 9 U/L (ref 0–35)
AST: 13 U/L (ref 0–37)
Albumin: 4.2 g/dL (ref 3.5–5.2)
Alkaline Phosphatase: 79 U/L (ref 39–117)
Bilirubin, Direct: 0.1 mg/dL (ref 0.0–0.3)
Total Bilirubin: 0.4 mg/dL (ref 0.2–1.2)
Total Protein: 7.2 g/dL (ref 6.0–8.3)

## 2019-10-29 LAB — LIPID PANEL
Cholesterol: 204 mg/dL — ABNORMAL HIGH (ref 0–200)
HDL: 51.2 mg/dL (ref >39.00)
LDL Cholesterol: 126 mg/dL — ABNORMAL HIGH (ref 0–99)
NonHDL: 152.57
Total CHOL/HDL Ratio: 4
Triglycerides: 133 mg/dL (ref 0.0–149.0)
VLDL: 26.6 mg/dL (ref 0.0–40.0)

## 2019-10-29 LAB — TSH: TSH: 1.05 u[IU]/mL (ref 0.35–4.50)

## 2019-10-29 LAB — MICROALBUMIN / CREATININE URINE RATIO
Creatinine,U: 205.6 mg/dL
Microalb Creat Ratio: 0.7 mg/g (ref 0.0–30.0)
Microalb, Ur: 1.4 mg/dL (ref 0.0–1.9)

## 2019-10-29 LAB — IBC PANEL
Iron: 75 ug/dL (ref 42–145)
Saturation Ratios: 23.9 % (ref 20.0–50.0)
Transferrin: 224 mg/dL (ref 212.0–360.0)

## 2019-10-29 LAB — HEMOGLOBIN A1C: Hgb A1c MFr Bld: 6 % (ref 4.6–6.5)

## 2019-10-29 LAB — VITAMIN D 25 HYDROXY (VIT D DEFICIENCY, FRACTURES): VITD: 29.18 ng/mL — ABNORMAL LOW (ref 30.00–100.00)

## 2019-10-29 LAB — VITAMIN B12: Vitamin B-12: >1500 pg/mL — ABNORMAL HIGH (ref 211–911)

## 2019-10-29 MED ORDER — CYANOCOBALAMIN 1000 MCG/ML IJ SOLN
1000.0000 ug | Freq: Once | INTRAMUSCULAR | Status: AC
  Administered 2019-10-29: 1000 ug via INTRAMUSCULAR

## 2019-10-29 MED ORDER — DOXYCYCLINE HYCLATE 100 MG PO TABS: 100.0000 mg | ORAL_TABLET | Freq: Two times a day (BID) | ORAL | 0 refills | Status: DC

## 2019-10-29 MED ORDER — VITAMIN D (ERGOCALCIFEROL) 1.25 MG (50000 UNIT) PO CAPS: 50000.0000 [IU] | ORAL_CAPSULE | ORAL | 0 refills | Status: DC

## 2019-10-29 NOTE — Assessment & Plan Note (Signed)
stable overall by history and exam, recent data reviewed with pt, and pt to continue medical treatment as before,  to f/u any worsening symptoms or concerns  

## 2019-10-29 NOTE — Assessment & Plan Note (Addendum)
Mild to mod, for antibx course,  to f/u any worsening symptoms or concerns  I spent 32 minutes in preparing to see the patient by review of recent labs, imaging and procedures, obtaining and reviewing separately obtained history, communicating with the patient and family or caregiver, ordering medications, tests or procedures, and documenting clinical information in the EHR including the differential Dx, treatment, and any further evaluation and other management of boil, b12 and vit d deficiency, hypeglycemia, ckd, iron def anemia

## 2019-10-29 NOTE — Progress Notes (Signed)
Subjective:    Patient ID: [name] [name], female    DOB: 08/29/1940, 79 y.o.   MRN: [id number]  HPI  Here for wellness and f/u;  Overall doing ok;  Pt denies Chest pain, worsening SOB, DOE, wheezing, orthopnea, PND, worsening LE edema, palpitations, dizziness or syncope.  Pt denies neurological change such as new headache, facial or extremity weakness.  Pt denies polydipsia, polyuria, or low sugar symptoms. Pt states overall good compliance with treatment and medications, good tolerability, and has been trying to follow appropriate diet.  Pt denies worsening depressive symptoms, suicidal ideation or panic. No fever, night sweats, wt loss, loss of appetite, or other constitutional symptoms.  Pt states good ability with ADL's, has low fall risk, home safety reviewed and adequate, no other significant changes in hearing or vision, and only occasionally active with exercise. Also with a tender boil to right upper thigh, small, without fever, drainage Past Medical History:  Diagnosis Date  . AICD (automatic cardioverter/defibrillator) present   . ALLERGIC RHINITIS 11/06/2007  . 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 ANEMIA-NOS 06/17/2008  . ANXIETY 02/22/2008  . CARDIOMYOPATHY, PRIMARY, DILATED 12/30/2008  . [place] DISEASE, 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  . GERD 05/07/2007  . History of kidney stones   . HYPERLIPIDEMIA 05/23/2007  . HYPERTENSION 05/07/2007  . 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 MENOPAUSAL DISORDER 05/06/2008  . 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 OVERACTIVE BLADDER 08/03/2010  . PYELONEPHRITIS, HX OF 05/07/2007  . SYSTOLIC HEART FAILURE, CHRONIC 12/28/2008  . VITAMIN D DEFICIENCY 06/17/2008   Past Surgical History:  Procedure Laterality Date  . ABDOMINAL HYSTERECTOMY    . BREAST LUMPECTOMY WITH RADIOACTIVE SEED AND SENTINEL LYMPH NODE BIOPSY Right 10/11/2018   Procedure: RIGHT BREAST LUMPECTOMY WITH RADIOACTIVE SEED AND SENTINEL LYMPH NODE BIOPSY;  Surgeon: Stark Klein, MD;  Location: [facility];  Service: General;  Laterality: Right;  . ICD GENERATOR CHANGEOUT N/A 10/25/2017   Procedure: ICD GENERATOR CHANGEOUT;  Surgeon: Deboraha Sprang, MD;  Location: [facility];  Service: Cardiovascular;  Laterality: N/A;  . LAMINECTOMY    . PORT-A-CATH REMOVAL N/A 05/08/2019   Procedure: REMOVAL OF PORT-A-CATH RIGHT CHEST;  Surgeon: Stark Klein, MD;  Location: [facility];  Service: General;  Laterality: N/A;  . PORTACATH PLACEMENT N/A 10/11/2018   Procedure: INSERTION PORT-A-CATH;  Surgeon: Stark Klein, MD;  Location: [facility];  Service: General;  Laterality: N/A;  . RE-EXCISION OF BREAST LUMPECTOMY Right 10/23/2018   Procedure: RE-EXCISION OF RIGHT BREAST LUMPECTOMY;  Surgeon: Stark Klein, MD;  Location: [facility];  Service: General;  Laterality: Right;    reports that she has never smoked. She has never used smokeless tobacco. She reports that she does not drink alcohol or use drugs. family history includes Arthritis in an other family member; Coronary artery disease in an other family member; Heart disease in her mother; Kidney disease in an other family member. Allergies  Allergen Reactions  . Topamax [Topiramate] Other (See Comments)    Confusion, gets lost going home  . Zocor [Simvastatin - High Dose] Nausea Only    Nausea    Current Outpatient Medications on File Prior to Visit  Medication Sig Dispense Refill  . albuterol (PROVENTIL HFA;VENTOLIN HFA) 108 (90 Base) MCG/ACT inhaler Inhale 1 puff into the lungs every 6 (six) hours as needed for wheezing or shortness of breath.    . carvedilol (COREG) 25 MG tablet TAKE 1 TABLET BY MOUTH TWICE A DAY 180 tablet 1  .  cholecalciferol (VITAMIN D3) 25 MCG (1000 UT) tablet Take 1,000 Units by mouth daily.    . furosemide (LASIX) 40 MG tablet TAKE 1 TABLET BY MOUTH  TWICE DAILY 180 tablet 3  . ibuprofen (ADVIL) 800 MG tablet TAKE 1 TABLET (800 MG TOTAL) EVERY 8 (EIGHT) HOURS AS NEEDED BY MOUTH. 40 tablet 2  .  lidocaine-prilocaine (EMLA) cream APPLY TO AFFECTED AREA ONCE AS DIRECTED    . meloxicam (MOBIC) 15 MG tablet TAKE 1 TABLET BY MOUTH EVERY DAY AS NEEDED FOR PAIN 90 tablet 3  . Multiple Vitamin (MULTIVITAMIN WITH MINERALS) TABS tablet Take 1 tablet by mouth daily.    . rizatriptan (MAXALT) 10 MG tablet Take 10 mg by mouth as needed for migraine. May repeat in 2 hours if needed    . sacubitril-valsartan (ENTRESTO) 24-26 MG Take 1 tablet by mouth 2 (two) times daily. NEED OV. 60 tablet 2  . spironolactone (ALDACTONE) 25 MG tablet Take 0.5 tablets (12.5 mg total) by mouth daily. 45 tablet 3  . traMADol (ULTRAM) 50 MG tablet Take 1 tablet (50 mg total) by mouth every 6 (six) hours as needed. 60 tablet 2  . [DISCONTINUED] prochlorperazine (COMPAZINE) 10 MG tablet TAKE 1 TABLET (10 MG TOTAL) BY MOUTH EVERY 6 (SIX) HOURS AS NEEDED (NAUSEA OR VOMITING). 30 tablet 1   No current facility-administered medications on file prior to visit.   Review of Systems All otherwise neg per pt     Objective:   Physical Exam BP 110/68 (BP Location: Left Arm, Patient Position: Sitting, Cuff Size: Large)   Pulse 80   Temp 98.1 F (36.7 C) (Oral)   Ht 5\' 7"  (1.702 m)   Wt 224 lb (101.6 kg)   SpO2 98%   BMI 35.08 kg/m  VS noted,  Constitutional: Pt appears in NAD HENT: Head: NCAT.  Right Ear: External ear normal.  Left Ear: External ear normal.  Eyes: . Pupils are equal, round, and reactive to light. Conjunctivae and EOM are normal Nose: without d/c or deformity Neck: Neck supple. Gross normal ROM Cardiovascular: Normal rate and regular rhythm.   Pulmonary/Chest: Effort normal and breath sounds without rales or wheezing.  Abd:  Soft, NT, ND, + BS, no organomegaly Neurological: Pt is alert. At baseline orientation, motor grossly intact Skin: Skin is warm. No rashes, other new lesions, no LE edema Psychiatric: Pt behavior is normal without agitation  All otherwise neg per pt Lab Results  Component Value  Date   WBC 4.7 10/29/2019   HGB 11.2 (L) 10/29/2019   HCT 35.2 (L) 10/29/2019   PLT 199.0 10/29/2019   GLUCOSE 115 (H) 10/29/2019   CHOL 204 (H) 10/29/2019   TRIG 133.0 10/29/2019   HDL 51.20 10/29/2019   LDLDIRECT 129.7 02/07/2013   LDLCALC 126 (H) 10/29/2019   ALT 9 10/29/2019   AST 13 10/29/2019   NA 142 10/29/2019   K 5.4 (H) 10/29/2019   CL 105 10/29/2019   CREATININE 1.41 (H) 10/29/2019   BUN 18 10/29/2019   CO2 30 10/29/2019   TSH 1.05 10/29/2019   INR 0.9 RATIO 06/18/2007   HGBA1C 6.0 10/29/2019   MICROALBUR 1.4 10/29/2019       Assessment & Plan:

## 2019-10-29 NOTE — Assessment & Plan Note (Signed)
To f/u with renal first visit soon

## 2019-10-29 NOTE — Assessment & Plan Note (Signed)
For f/u lab 

## 2019-10-29 NOTE — Assessment & Plan Note (Signed)
For f/u lab and oral replacement

## 2019-10-29 NOTE — Assessment & Plan Note (Signed)

## 2019-10-29 NOTE — Patient Instructions (Addendum)
Please take all new medication as prescribed - the antibiotic  You had the B12 shot today  Please continue all other medications as before, and refills have been done if requested.  Please have the pharmacy call with any other refills you may need.  Please continue your efforts at being more active, low cholesterol diet, and weight control.  You are otherwise up to date with prevention measures today.  Please keep your appointments with your specialists as you may have planned  Please go to the LAB at the blood drawing area for the tests to be done  You will be contacted by phone if any changes need to be made immediately.  Otherwise, you will receive a letter about your results with an explanation, but please check with MyChart first.  Please remember to sign up for MyChart if you have not done so, as this will be important to you in the future with finding out test results, communicating by private email, and scheduling acute appointments online when needed.  Please make an Appointment to return in 6 months, or sooner if needed

## 2019-11-05 ENCOUNTER — Ambulatory Visit (INDEPENDENT_AMBULATORY_CARE_PROVIDER_SITE_OTHER): Payer: Medicare Other | Admitting: *Deleted

## 2019-11-05 DIAGNOSIS — I428 Other cardiomyopathies: Secondary | ICD-10-CM | POA: Diagnosis not present

## 2019-11-06 LAB — CUP PACEART REMOTE DEVICE CHECK
Battery Remaining Longevity: 118 mo
Battery Voltage: 3.01 V
Brady Statistic RV Percent Paced: 0.01 %
Date Time Interrogation Session: 20210427162709
HighPow Impedance: 38 Ohm
HighPow Impedance: 51 Ohm
Implantable Lead Implant Date: 20081215
Implantable Lead Location: 753860
Implantable Lead Model: 6947
Implantable Pulse Generator Implant Date: 20190418
Lead Channel Impedance Value: 399 Ohm
Lead Channel Impedance Value: 456 Ohm
Lead Channel Pacing Threshold Amplitude: 0.625 V
Lead Channel Pacing Threshold Pulse Width: 0.4 ms
Lead Channel Sensing Intrinsic Amplitude: 13.125 mV
Lead Channel Sensing Intrinsic Amplitude: 13.125 mV
Lead Channel Setting Pacing Amplitude: 2.5 V
Lead Channel Setting Pacing Pulse Width: 0.4 ms
Lead Channel Setting Sensing Sensitivity: 0.3 mV

## 2019-11-06 NOTE — Progress Notes (Signed)
ICD Remote  

## 2019-11-22 DIAGNOSIS — C50511 Malignant neoplasm of lower-outer quadrant of right female breast: Secondary | ICD-10-CM | POA: Diagnosis not present

## 2019-11-22 DIAGNOSIS — Z171 Estrogen receptor negative status [ER-]: Secondary | ICD-10-CM | POA: Diagnosis not present

## 2019-11-22 NOTE — Progress Notes (Signed)
HPI: FU nonischemic cardiomyopathy.Cardiac catheterization August 2004 showed normal coronary arteries.She had an ICD placed on June 25, 2007. 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 Patient was referred to Dr. Lovena Le by Dr. Caryl Comes for consideration of ablation but Dr. Lovena Le felt medical therapy indicated.Nuclear study repeated April 2018 and showed ejection fraction 35% with no ischemia or infarction.Last echocardiogram March 2020 showed ejection fraction 20 to 25%, severe left ventricular enlargement, grade 2 diastolic dysfunction, mild left atrial enlargement. Since I last saw her,she has dyspnea with more vigorous activities but not routine activities.  No orthopnea or PND.  Minimal pedal edema.  No chest pain or syncope.  Current Outpatient Medications  Medication Sig Dispense Refill  . albuterol (PROVENTIL HFA;VENTOLIN HFA) 108 (90 Base) MCG/ACT inhaler Inhale 1 puff into the lungs every 6 (six) hours as needed for wheezing or shortness of breath.    . carvedilol (COREG) 25 MG tablet TAKE 1 TABLET BY MOUTH TWICE A DAY 180 tablet 1  . cholecalciferol (VITAMIN D3) 25 MCG (1000 UT) tablet Take 1,000 Units by mouth daily.    [name] doxycycline (VIBRA-TABS) 100 MG tablet Take 1 tablet (100 mg total) by mouth 2 (two) times daily. 20 tablet 0  . furosemide (LASIX) 40 MG tablet TAKE 1 TABLET BY MOUTH  TWICE DAILY 180 tablet 3  . ibuprofen (ADVIL) 800 MG tablet TAKE 1 TABLET (800 MG TOTAL) EVERY 8 (EIGHT) HOURS AS NEEDED BY MOUTH. 40 tablet 2  . lidocaine-prilocaine (EMLA) cream APPLY TO AFFECTED AREA ONCE AS DIRECTED    . meloxicam (MOBIC) 15 MG tablet TAKE 1 TABLET BY MOUTH EVERY DAY AS NEEDED FOR PAIN 90 tablet 3  . Multiple Vitamin (MULTIVITAMIN WITH MINERALS) TABS tablet Take 1 tablet by mouth daily.    . rizatriptan (MAXALT) 10 MG tablet Take 10 mg by mouth as needed for migraine. May repeat in 2 hours if needed    . sacubitril-valsartan (ENTRESTO)  24-26 MG Take 1 tablet by mouth 2 (two) times daily. NEED OV. 60 tablet 2  . spironolactone (ALDACTONE) 25 MG tablet Take 0.5 tablets (12.5 mg total) by mouth daily. 45 tablet 3  . traMADol (ULTRAM) 50 MG tablet Take 1 tablet (50 mg total) by mouth every 6 (six) hours as needed. 60 tablet 2  . Vitamin D, Ergocalciferol, (DRISDOL) 1.25 MG (50000 UNIT) CAPS capsule Take 1 capsule (50,000 Units total) by mouth every 7 (seven) days. 12 capsule 0   No current facility-administered medications for this visit.     Past Medical History:  Diagnosis Date  . AICD (automatic cardioverter/defibrillator) present   . ALLERGIC RHINITIS 11/06/2007  . 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 ANEMIA-NOS 06/17/2008  . ANXIETY 02/22/2008  . CARDIOMYOPATHY, PRIMARY, DILATED 12/30/2008  . [place] DISEASE, 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  . GERD 05/07/2007  . History of kidney stones   . HYPERLIPIDEMIA 05/23/2007  . HYPERTENSION 05/07/2007  . 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 MENOPAUSAL DISORDER 05/06/2008  . 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 OVERACTIVE BLADDER 08/03/2010  . PYELONEPHRITIS, HX OF 05/07/2007  . SYSTOLIC HEART FAILURE, CHRONIC 12/28/2008  . VITAMIN D DEFICIENCY 06/17/2008    Past Surgical History:  Procedure Laterality Date  . ABDOMINAL HYSTERECTOMY    . BREAST LUMPECTOMY WITH RADIOACTIVE SEED AND SENTINEL LYMPH NODE BIOPSY Right 10/11/2018   Procedure: RIGHT BREAST LUMPECTOMY WITH RADIOACTIVE SEED AND SENTINEL LYMPH NODE BIOPSY;  Surgeon: Stark Klein, MD;  Location: [facility];  Service:  General;  Laterality: Right;  . ICD GENERATOR CHANGEOUT N/A 10/25/2017   Procedure: ICD GENERATOR CHANGEOUT;  Surgeon: Deboraha Sprang, MD;  Location: [facility];  Service: Cardiovascular;  Laterality: N/A;  . LAMINECTOMY    . PORT-A-CATH REMOVAL N/A 05/08/2019   Procedure: REMOVAL OF PORT-A-CATH RIGHT CHEST;  Surgeon: Stark Klein, MD;  Location: [facility];  Service: General;  Laterality: N/A;  .  PORTACATH PLACEMENT N/A 10/11/2018   Procedure: INSERTION PORT-A-CATH;  Surgeon: Stark Klein, MD;  Location: [facility];  Service: General;  Laterality: N/A;  . RE-EXCISION OF BREAST LUMPECTOMY Right 10/23/2018   Procedure: RE-EXCISION OF RIGHT BREAST LUMPECTOMY;  Surgeon: Stark Klein, MD;  Location: [facility];  Service: General;  Laterality: Right;    Social History   Socioeconomic History  . Marital status: Widowed    Spouse name: Not on file  . Number of children: 3  . Years of education: Not on file  . Highest education level: Not on file  Occupational History  . Occupation: retired Ambulance person: RETIRED  Tobacco Use  . Smoking status: Never Smoker  . Smokeless tobacco: Never Used  Substance and Sexual Activity  . Alcohol use: No  . Drug use: No  . Sexual activity: Never  Other Topics Concern  . Not on file  Social History Narrative  . Not on file   Social Determinants of Health   Financial Resource Strain:   . Difficulty of Paying Living Expenses:   Food Insecurity:   . Worried About Charity fundraiser in the Last Year:   . Arboriculturist in the Last Year:   Transportation Needs: No Transportation Needs  . Lack of Transportation (Medical): No  . Lack of Transportation (Non-Medical): No  Physical Activity:   . Days of Exercise per Week:   . Minutes of Exercise per Session:   Stress:   . Feeling of Stress :   Social Connections:   . Frequency of Communication with Friends and Family:   . Frequency of Social Gatherings with Friends and Family:   . Attends Religious Services:   . Active Member of Clubs or Organizations:   . Attends Archivist Meetings:   [name] Marital Status:   Intimate Partner Violence: Not At Risk  . Fear of Current or Ex-Partner: No  . Emotionally Abused: No  . Physically Abused: No  . Sexually Abused: No    Family History  Problem Relation Age of Onset  . Arthritis Other   . Coronary artery disease Other   . Kidney  disease Other   . Heart disease Mother     ROS: Fatigue following treatment regimen no fevers or chills, productive cough, hemoptysis, dysphasia, odynophagia, melena, hematochezia, dysuria, hematuria, rash, seizure activity, orthopnea, PND, claudication. Remaining systems are negative.  Physical Exam: Well-developed well-nourished in no acute distress.  Skin is warm and dry.  HEENT is normal.  Neck is supple.  Chest is clear to auscultation with normal expansion.  Cardiovascular exam is regular rate and rhythm.  Abdominal exam nontender or distended. No masses palpated. Extremities show no edema. neuro grossly intact  ECG-sinus rhythm at a rate of 74, normal axis, left ventricular hypertrophy with repolarization abnormality.  Personally reviewed  A/P  1 nonischemic cardiomyopathy-continue Entresto, beta-blocker and spironolactone.  2 chronic systolic congestive heart failure-she appears to be euvolemic on examination.  Continue diuretics at present dose.  Check potassium and renal function.  3 hypertension-blood pressure  controlled.  Continue present medical regimen.  4 hyperlipidemia-continue statin.  5 prior ICD-patient is followed by electrophysiology.  6 history of PVCs/continue beta-blocker.  Previously seen by Dr. Lovena Le and ablation not pursued.  Kirk Ruths, MD

## 2019-11-29 ENCOUNTER — Ambulatory Visit: Payer: Medicare Other | Admitting: Internal Medicine

## 2019-11-29 ENCOUNTER — Other Ambulatory Visit: Payer: Self-pay

## 2019-11-29 ENCOUNTER — Ambulatory Visit: Payer: Medicare Other | Admitting: Cardiology

## 2019-11-29 ENCOUNTER — Encounter: Payer: Self-pay | Admitting: Cardiology

## 2019-11-29 VITALS — BP 124/60 | HR 74 | Ht 67.0 in | Wt 230.0 lb

## 2019-11-29 DIAGNOSIS — I5022 Chronic systolic (congestive) heart failure: Secondary | ICD-10-CM | POA: Diagnosis not present

## 2019-11-29 DIAGNOSIS — I428 Other cardiomyopathies: Secondary | ICD-10-CM | POA: Diagnosis not present

## 2019-11-29 DIAGNOSIS — I493 Ventricular premature depolarization: Secondary | ICD-10-CM

## 2019-11-29 DIAGNOSIS — I10 Essential (primary) hypertension: Secondary | ICD-10-CM | POA: Diagnosis not present

## 2019-11-29 NOTE — Patient Instructions (Signed)

## 2019-11-29 NOTE — Progress Notes (Signed)
This encounter was created in error - please disregard.

## 2019-12-02 ENCOUNTER — Other Ambulatory Visit: Payer: Self-pay | Admitting: Internal Medicine

## 2019-12-03 ENCOUNTER — Other Ambulatory Visit: Payer: Self-pay | Admitting: Internal Medicine

## 2019-12-03 DIAGNOSIS — I5022 Chronic systolic (congestive) heart failure: Secondary | ICD-10-CM

## 2019-12-05 DIAGNOSIS — E875 Hyperkalemia: Secondary | ICD-10-CM | POA: Diagnosis not present

## 2019-12-05 DIAGNOSIS — I429 Cardiomyopathy, unspecified: Secondary | ICD-10-CM | POA: Diagnosis not present

## 2019-12-05 DIAGNOSIS — D631 Anemia in chronic kidney disease: Secondary | ICD-10-CM | POA: Diagnosis not present

## 2019-12-05 DIAGNOSIS — N1831 Chronic kidney disease, stage 3a: Secondary | ICD-10-CM | POA: Diagnosis not present

## 2019-12-05 DIAGNOSIS — C50511 Malignant neoplasm of lower-outer quadrant of right female breast: Secondary | ICD-10-CM | POA: Diagnosis not present

## 2019-12-13 ENCOUNTER — Other Ambulatory Visit: Payer: Self-pay | Admitting: Hematology and Oncology

## 2019-12-13 DIAGNOSIS — C50511 Malignant neoplasm of lower-outer quadrant of right female breast: Secondary | ICD-10-CM

## 2019-12-26 ENCOUNTER — Other Ambulatory Visit: Payer: Self-pay | Admitting: Internal Medicine

## 2019-12-26 NOTE — Telephone Encounter (Signed)
Please let pt know to change to OTC Vitamin D3 at 2000 units per day, indefinitely.  

## 2020-01-01 ENCOUNTER — Other Ambulatory Visit: Payer: Self-pay | Admitting: Internal Medicine

## 2020-01-01 NOTE — Telephone Encounter (Signed)
Please refill as per office routine med refill policy (all routine meds refilled for 3 mo or monthly per pt preference up to one year from last visit, then month to month grace period for 3 mo, then further med refills will have to be denied)  

## 2020-01-08 DIAGNOSIS — H52222 Regular astigmatism, left eye: Secondary | ICD-10-CM | POA: Diagnosis not present

## 2020-01-08 DIAGNOSIS — H25813 Combined forms of age-related cataract, bilateral: Secondary | ICD-10-CM | POA: Diagnosis not present

## 2020-01-08 DIAGNOSIS — H5203 Hypermetropia, bilateral: Secondary | ICD-10-CM | POA: Diagnosis not present

## 2020-01-08 DIAGNOSIS — H524 Presbyopia: Secondary | ICD-10-CM | POA: Diagnosis not present

## 2020-01-24 ENCOUNTER — Encounter: Payer: Medicare Other | Admitting: Internal Medicine

## 2020-02-03 ENCOUNTER — Other Ambulatory Visit: Payer: Self-pay | Admitting: Internal Medicine

## 2020-02-03 NOTE — Telephone Encounter (Signed)
This is not normally a refillable medication.  Would need rov

## 2020-02-04 ENCOUNTER — Ambulatory Visit (INDEPENDENT_AMBULATORY_CARE_PROVIDER_SITE_OTHER): Payer: Medicare Other | Admitting: *Deleted

## 2020-02-04 DIAGNOSIS — I471 Supraventricular tachycardia: Secondary | ICD-10-CM

## 2020-02-05 LAB — CUP PACEART REMOTE DEVICE CHECK
Battery Remaining Longevity: 114 mo
Battery Voltage: 3 V
Brady Statistic RV Percent Paced: 0.01 %
Date Time Interrogation Session: 20210727234646
HighPow Impedance: 40 Ohm
HighPow Impedance: 49 Ohm
Implantable Lead Implant Date: 20081215
Implantable Lead Location: 753860
Implantable Lead Model: 6947
Implantable Pulse Generator Implant Date: 20190418
Lead Channel Impedance Value: 380 Ohm
Lead Channel Impedance Value: 494 Ohm
Lead Channel Pacing Threshold Amplitude: 0.625 V
Lead Channel Pacing Threshold Pulse Width: 0.4 ms
Lead Channel Sensing Intrinsic Amplitude: 14 mV
Lead Channel Sensing Intrinsic Amplitude: 14 mV
Lead Channel Setting Pacing Amplitude: 2.5 V
Lead Channel Setting Pacing Pulse Width: 0.4 ms
Lead Channel Setting Sensing Sensitivity: 0.3 mV

## 2020-02-06 ENCOUNTER — Other Ambulatory Visit: Payer: Self-pay | Admitting: Cardiology

## 2020-02-06 DIAGNOSIS — I428 Other cardiomyopathies: Secondary | ICD-10-CM

## 2020-02-07 NOTE — Progress Notes (Signed)
Remote ICD transmission.   

## 2020-03-18 ENCOUNTER — Ambulatory Visit: Payer: Medicare Other | Admitting: Internal Medicine

## 2020-04-03 ENCOUNTER — Other Ambulatory Visit: Payer: Self-pay

## 2020-04-03 ENCOUNTER — Encounter: Payer: Self-pay | Admitting: Internal Medicine

## 2020-04-03 ENCOUNTER — Ambulatory Visit: Payer: Medicare Other | Admitting: Internal Medicine

## 2020-04-03 VITALS — BP 116/68 | HR 60 | Ht 67.0 in | Wt 227.0 lb

## 2020-04-03 DIAGNOSIS — I493 Ventricular premature depolarization: Secondary | ICD-10-CM

## 2020-04-03 DIAGNOSIS — Z95810 Presence of automatic (implantable) cardiac defibrillator: Secondary | ICD-10-CM

## 2020-04-03 DIAGNOSIS — R0602 Shortness of breath: Secondary | ICD-10-CM

## 2020-04-03 DIAGNOSIS — R0600 Dyspnea, unspecified: Secondary | ICD-10-CM | POA: Diagnosis not present

## 2020-04-03 DIAGNOSIS — I5022 Chronic systolic (congestive) heart failure: Secondary | ICD-10-CM

## 2020-04-03 DIAGNOSIS — I428 Other cardiomyopathies: Secondary | ICD-10-CM

## 2020-04-03 NOTE — Patient Instructions (Addendum)
Medication Instructions:  Your physician recommends that you continue on your current medications as directed. Please refer to the Current Medication list given to you today.  *If you need a refill on your cardiac medications before your next appointment, please call your pharmacy*   Lab Work: BMET today  If you have labs (blood work) drawn today and your tests are completely normal, you will receive your results only by: [name] MyChart Message (if you have MyChart) OR . A paper copy in the mail If you have any lab test that is abnormal or we need to change your treatment, we will call you to review the results.   Testing/Procedures: Your physician has recommended that you have a cardiopulmonary stress test (CPX). CPX testing is a non-invasive measurement of heart and lung function. It replaces a traditional treadmill stress test. This type of test provides a tremendous amount of information that relates not only to your present condition but also for future outcomes. This test combines measurements of you ventilation, respiratory gas exchange in the lungs, electrocardiogram (EKG), blood pressure and physical response before, during, and following an exercise protocol.  Non-Cardiac CT scanning, (CAT scanning), is a noninvasive, special x-ray that produces cross-sectional images of the body using x-rays and a computer. CT scans help physicians diagnose and treat medical conditions. For some CT exams, a contrast material is used to enhance visibility in the area of the body being studied. CT scans provide greater clarity and reveal more details than regular x-ray exams.     Follow-Up: At [facility], you and your health needs are our priority.  As part of our continuing mission to provide you with exceptional heart care, we have created designated Provider Care Teams.  These Care Teams include your primary Cardiologist (physician) and Advanced Practice Providers (APPs -  Physician Assistants and  Nurse Practitioners) who all work together to provide you with the care you need, when you need it.  We recommend signing up for the patient portal called "MyChart".  Sign up information is provided on this After Visit Summary.  MyChart is used to connect with patients for Virtual Visits (Telemedicine).  Patients are able to view lab/test results, encounter notes, upcoming appointments, etc.  Non-urgent messages can be sent to your provider as well.   To learn more about what you can do with MyChart, go to [email or web address].    Your next appointment:   12 month(s)  The format for your next appointment:   In Person  Provider:   [name], MD

## 2020-04-03 NOTE — Progress Notes (Signed)
Patient Care Team: Biagio Borg, MD as PCP - General Stanford Breed, Denice Bors, MD as Consulting Physician (Cardiology) Deboraha Sprang, MD as Consulting Physician (Cardiology) Nicholas Lose, MD as Consulting Physician (Hematology and Oncology) Stark Neshia Mckenzie, MD as Consulting Physician (General Surgery) Eppie Gibson, MD as Attending Physician (Radiation Oncology)   HPI  [name] [name] is a 79 y.o. female seen in followup for an ICD implanted 2008 for primary prevention in the setting of nonischemic heart disease. She has chronic systolic heart failure with a relatively narrow QRS and underwent single-chamber defibrillator implantation. 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  At less than about 150 feet.  3-4 pillow orthopnea.  Occasional edema.  Her impression is that it was much worse since the chemo and radiation therapy for her breast cancer.  Chest x-ray was reviewed unrevealing.  Continue fluid restriction  DATE TEST EF   11/08 MYOVIEW  34 %   3/15 Echo   15-20 %   10/18 Echo  25%   3/20 Echo  20-25%    Date PVCs  11/15 11%          Re PVCs-had asked [facility] to consider catheter ablation. Her symptoms seem to be improved and her PVC burden by ECG had diminished and so medical therapy was recommended.   Date Cr K Hgb  4/19 1.38 4.4 10.8  2/20  1.44 4.2 11.4  4/21 1.41 5.4 11.2   I  Interval diagnosis of breast cancer.  Triple negative.  Scheduled for lumpectomy, radiation and chemotherapy.  Past Medical History:  Diagnosis Date  . AICD (automatic cardioverter/defibrillator) present   . ALLERGIC RHINITIS 11/06/2007  . 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 ANEMIA-NOS 06/17/2008  . ANXIETY 02/22/2008  . CARDIOMYOPATHY, PRIMARY, DILATED 12/30/2008  . [place] DISEASE, 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  . GERD 05/07/2007  . History of kidney stones   . HYPERLIPIDEMIA 05/23/2007  . HYPERTENSION  05/07/2007  . 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 MENOPAUSAL DISORDER 05/06/2008  . 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 OVERACTIVE BLADDER 08/03/2010  . PYELONEPHRITIS, HX OF 05/07/2007  . SYSTOLIC HEART FAILURE, CHRONIC 12/28/2008  . VITAMIN D DEFICIENCY 06/17/2008    Past Surgical History:  Procedure Laterality Date  . ABDOMINAL HYSTERECTOMY    . BREAST LUMPECTOMY WITH RADIOACTIVE SEED AND SENTINEL LYMPH NODE BIOPSY Right 10/11/2018   Procedure: RIGHT BREAST LUMPECTOMY WITH RADIOACTIVE SEED AND SENTINEL LYMPH NODE BIOPSY;  Surgeon: Stark Jamy Whyte, MD;  Location: [facility];  Service: General;  Laterality: Right;  . ICD GENERATOR CHANGEOUT N/A 10/25/2017   Procedure: ICD GENERATOR CHANGEOUT;  Surgeon: Deboraha Sprang, MD;  Location: [facility];  Service: Cardiovascular;  Laterality: N/A;  . LAMINECTOMY    . PORT-A-CATH REMOVAL N/A 05/08/2019   Procedure: REMOVAL OF PORT-A-CATH RIGHT CHEST;  Surgeon: Stark Adrine Hayworth, MD;  Location: [facility];  Service: General;  Laterality: N/A;  . PORTACATH PLACEMENT N/A 10/11/2018   Procedure: INSERTION PORT-A-CATH;  Surgeon: Stark Robey Massmann, MD;  Location: [facility];  Service: General;  Laterality: N/A;  . RE-EXCISION OF BREAST LUMPECTOMY Right 10/23/2018   Procedure: RE-EXCISION OF RIGHT BREAST LUMPECTOMY;  Surgeon: Stark Aram Domzalski, MD;  Location: [facility];  Service: General;  Laterality: Right;    Current Outpatient Medications  Medication Sig Dispense Refill  . albuterol (PROVENTIL HFA;VENTOLIN HFA) 108 (90 Base) MCG/ACT inhaler Inhale 1 puff into the lungs every 6 (six) hours as needed for wheezing  or shortness of breath.    . carvedilol (COREG) 25 MG tablet TAKE 1 TABLET BY MOUTH TWICE A DAY 180 tablet 1  . cholecalciferol (VITAMIN D3) 25 MCG (1000 UT) tablet Take 1,000 Units by mouth daily.    [name] ENTRESTO 24-26 MG TAKE 1 TABLET BY MOUTH 2 (TWO) TIMES DAILY 60 tablet 3  . furosemide (LASIX) 40 MG tablet TAKE 1 TABLET BY MOUTH  TWICE DAILY 180 tablet 3  .  ibuprofen (ADVIL) 800 MG tablet TAKE 1 TABLET (800 MG TOTAL) EVERY 8 (EIGHT) HOURS AS NEEDED BY MOUTH. 40 tablet 2  . lidocaine-prilocaine (EMLA) cream APPLY TO AFFECTED AREA ONCE AS DIRECTED 30 g 3  . meloxicam (MOBIC) 15 MG tablet TAKE 1 TABLET BY MOUTH EVERY DAY AS NEEDED FOR PAIN 90 tablet 3  . Multiple Vitamin (MULTIVITAMIN WITH MINERALS) TABS tablet Take 1 tablet by mouth daily.    . rizatriptan (MAXALT) 10 MG tablet Take 10 mg by mouth as needed for migraine. May repeat in 2 hours if needed    . spironolactone (ALDACTONE) 25 MG tablet TAKE ONE-HALF TABLET BY  MOUTH DAILY 45 tablet 3  . traMADol (ULTRAM) 50 MG tablet Take 1 tablet (50 mg total) by mouth every 6 (six) hours as needed. 60 tablet 2  . Vitamin D, Ergocalciferol, (DRISDOL) 1.25 MG (50000 UNIT) CAPS capsule Take 1 capsule (50,000 Units total) by mouth every 7 (seven) days. 12 capsule 0   No current facility-administered medications for this visit.    Allergies  Allergen Reactions  . Topamax [Topiramate] Other (See Comments)    Confusion, gets lost going home  . Zocor [Simvastatin - High Dose] Nausea Only    Nausea     Review of Systems negative except from HPI and PMH  Physical Exam BP 116/68   Pulse 60   Ht 5\' 7"  (1.702 m)   Wt 227 lb (103 kg)   SpO2 97%   BMI 35.55 kg/m  Well developed and well nourished in no acute distress HENT normal Neck supple with JVP-flat Clear Device pocket well healed; without hematoma or erythema.  There is no tethering  Regular rate and rhythm, no  murmur Abd-soft with active BS No Clubbing cyanosis   edema Skin-warm and dry A & Oriented  Grossly normal sensory and motor function  ECG sinus at 73  17/13/41 Interpolated PVC Ms.  Assessment and  Plan  Nonischemic cardiomyopathy   PVCs  Implantable defibrillator-Medtronic  Congestive heart failure-chronic-systolic   Ventricular tachycardia-polymorphic-appropriate therapy 2/20  Hypokalemia  High Risk Medication  Surveillance Aldactone  Significantly short of breath and are not sure why.  She appears to be euvolemic.  OptiVol is unrevealing.  Will check cardiopulmonary stress testing and hr CT  If this is not enlightening, consider pulmonary consultation.  No interval ventricular fibrillation.  PVCs appear to be relatively quiescient based on histogram profiles.  We will check a metabolic profile.  Potassium previously mildly abnormal on Aldactone.  May need [facility]

## 2020-04-04 LAB — BASIC METABOLIC PANEL
BUN/Creatinine Ratio: 13 (ref 12–28)
BUN: 19 mg/dL (ref 8–27)
CO2: 27 mmol/L (ref 20–29)
Calcium: 9.5 mg/dL (ref 8.7–10.3)
Chloride: 102 mmol/L (ref 96–106)
Creatinine, Ser: 1.49 mg/dL — ABNORMAL HIGH (ref 0.57–1.00)
GFR calc Af Amer: 39 mL/min/{1.73_m2} — ABNORMAL LOW (ref >59)
GFR calc non Af Amer: 34 mL/min/{1.73_m2} — ABNORMAL LOW (ref >59)
Glucose: 100 mg/dL — ABNORMAL HIGH (ref 65–99)
Potassium: 4.6 mmol/L (ref 3.5–5.2)
Sodium: 141 mmol/L (ref 134–144)

## 2020-04-06 LAB — CUP PACEART INCLINIC DEVICE CHECK
Battery Remaining Longevity: 111 mo
Battery Voltage: 3.01 V
Brady Statistic RV Percent Paced: 0.01 %
Date Time Interrogation Session: 20210924172807
HighPow Impedance: 40 Ohm
HighPow Impedance: 52 Ohm
Implantable Lead Implant Date: 20081215
Implantable Lead Location: 753860
Implantable Lead Model: 6947
Implantable Pulse Generator Implant Date: 20190418
Lead Channel Impedance Value: 380 Ohm
Lead Channel Impedance Value: 494 Ohm
Lead Channel Pacing Threshold Amplitude: 1.25 V
Lead Channel Pacing Threshold Pulse Width: 0.4 ms
Lead Channel Sensing Intrinsic Amplitude: 25.875 mV
Lead Channel Setting Pacing Amplitude: 2.5 V
Lead Channel Setting Pacing Pulse Width: 0.4 ms
Lead Channel Setting Sensing Sensitivity: 0.3 mV

## 2020-04-06 NOTE — Addendum Note (Signed)
Addended by: Thora Lance on: 04/06/2020 05:43 PM   Modules accepted: Orders

## 2020-04-07 ENCOUNTER — Other Ambulatory Visit: Payer: Self-pay

## 2020-04-07 ENCOUNTER — Telehealth: Payer: Self-pay

## 2020-04-07 MED ORDER — FUROSEMIDE 40 MG PO TABS: 40.0000 mg | ORAL_TABLET | Freq: Two times a day (BID) | ORAL | 3 refills | Status: DC

## 2020-04-07 NOTE — Telephone Encounter (Signed)
Attempted phone call to pt to notify pt Dr Caryl Comes has requested pt have CT of the chest forher shortness of breath.  Left voicemail for pt to contact RN at [phone].

## 2020-04-10 NOTE — Telephone Encounter (Signed)
Attempted phone call to pt.  Left voicemail message to contact RN at [phone]. 

## 2020-04-10 NOTE — Telephone Encounter (Signed)
-----   Message from Deboraha Sprang, MD sent at 04/07/2020 10:12 PM EDT ----- Please Inform Patient  Labs are normal x mild persistent renal dysfunction, K is now normal #  Thanks

## 2020-04-14 NOTE — Telephone Encounter (Signed)
Spoke with pt and advised  Per Dr Adine Madura are normal with exception of some mild renal dysfunction.  Potassium is now normal.  Pt verbalizes understanding and thanked Therapist, sports for call.

## 2020-04-14 NOTE — Telephone Encounter (Signed)
-----   Message from Deboraha Sprang, MD sent at 04/07/2020 10:12 PM EDT ----- Please Inform Patient  Labs are normal x mild persistent renal dysfunction, K is now normal #  Thanks

## 2020-04-16 ENCOUNTER — Other Ambulatory Visit: Payer: Self-pay

## 2020-04-16 ENCOUNTER — Ambulatory Visit (INDEPENDENT_AMBULATORY_CARE_PROVIDER_SITE_OTHER)
Admission: RE | Admit: 2020-04-16 | Discharge: 2020-04-16 | Disposition: A | Discharge status: Home / Self Care | Payer: Medicare Other | Source: Ambulatory Visit | Attending: Internal Medicine | Admitting: Internal Medicine

## 2020-04-16 DIAGNOSIS — R0602 Shortness of breath: Secondary | ICD-10-CM | POA: Diagnosis not present

## 2020-04-16 DIAGNOSIS — J984 Other disorders of lung: Secondary | ICD-10-CM | POA: Diagnosis not present

## 2020-04-17 ENCOUNTER — Other Ambulatory Visit: Payer: Self-pay

## 2020-04-17 DIAGNOSIS — I5022 Chronic systolic (congestive) heart failure: Secondary | ICD-10-CM

## 2020-04-17 MED ORDER — SPIRONOLACTONE 25 MG PO TABS: 12.5000 mg | ORAL_TABLET | Freq: Every day | ORAL | 3 refills | Status: DC

## 2020-04-24 ENCOUNTER — Ambulatory Visit: Payer: Self-pay | Attending: Critical Care Medicine

## 2020-04-24 ENCOUNTER — Ambulatory Visit: Payer: Self-pay

## 2020-04-24 DIAGNOSIS — Z23 Encounter for immunization: Secondary | ICD-10-CM

## 2020-05-05 ENCOUNTER — Ambulatory Visit (INDEPENDENT_AMBULATORY_CARE_PROVIDER_SITE_OTHER): Payer: Medicare Other

## 2020-05-05 DIAGNOSIS — I428 Other cardiomyopathies: Secondary | ICD-10-CM

## 2020-05-05 LAB — CUP PACEART REMOTE DEVICE CHECK
Battery Remaining Longevity: 111 mo
Battery Voltage: 3 V
Brady Statistic RV Percent Paced: 0.01 %
Date Time Interrogation Session: 20211026031806
HighPow Impedance: 39 Ohm
HighPow Impedance: 46 Ohm
Implantable Lead Implant Date: 20081215
Implantable Lead Location: 753860
Implantable Lead Model: 6947
Implantable Pulse Generator Implant Date: 20190418
Lead Channel Impedance Value: 380 Ohm
Lead Channel Impedance Value: 437 Ohm
Lead Channel Pacing Threshold Amplitude: 0.75 V
Lead Channel Pacing Threshold Pulse Width: 0.4 ms
Lead Channel Sensing Intrinsic Amplitude: 21.5 mV
Lead Channel Sensing Intrinsic Amplitude: 21.5 mV
Lead Channel Setting Pacing Amplitude: 2.5 V
Lead Channel Setting Pacing Pulse Width: 0.4 ms
Lead Channel Setting Sensing Sensitivity: 0.3 mV

## 2020-05-07 ENCOUNTER — Encounter (HOSPITAL_COMMUNITY): Payer: Medicare Other

## 2020-05-11 NOTE — Progress Notes (Signed)
Remote ICD transmission.   

## 2020-05-12 ENCOUNTER — Other Ambulatory Visit: Payer: Self-pay | Admitting: Internal Medicine

## 2020-05-14 ENCOUNTER — Telehealth: Payer: Self-pay

## 2020-05-14 NOTE — Telephone Encounter (Signed)
-----   Message from Deboraha Sprang, MD sent at 05/05/2020  7:31 PM EDT ----- Please Inform Patient that  CT is abnormal but the degree to which it is responsible is not so clear to me   lets arrange followup with her PCP    Thanks

## 2020-05-14 NOTE — Telephone Encounter (Signed)
Attempted phone call to pt.  Left voicemail message to contact RN at [phone]. 

## 2020-05-18 ENCOUNTER — Telehealth: Payer: Self-pay | Admitting: Hematology and Oncology

## 2020-05-18 ENCOUNTER — Inpatient Hospital Stay: Payer: Medicare Other | Attending: Hematology and Oncology | Admitting: Hematology and Oncology

## 2020-05-18 ENCOUNTER — Other Ambulatory Visit: Payer: Self-pay

## 2020-05-18 DIAGNOSIS — Z171 Estrogen receptor negative status [ER-]: Secondary | ICD-10-CM

## 2020-05-18 DIAGNOSIS — R5383 Other fatigue: Secondary | ICD-10-CM | POA: Insufficient documentation

## 2020-05-18 DIAGNOSIS — Z853 Personal history of malignant neoplasm of breast: Secondary | ICD-10-CM | POA: Diagnosis not present

## 2020-05-18 DIAGNOSIS — C50511 Malignant neoplasm of lower-outer quadrant of right female breast: Secondary | ICD-10-CM | POA: Diagnosis not present

## 2020-05-18 DIAGNOSIS — Z9221 Personal history of antineoplastic chemotherapy: Secondary | ICD-10-CM | POA: Diagnosis not present

## 2020-05-18 DIAGNOSIS — Z79899 Other long term (current) drug therapy: Secondary | ICD-10-CM | POA: Insufficient documentation

## 2020-05-18 DIAGNOSIS — R438 Other disturbances of smell and taste: Secondary | ICD-10-CM | POA: Insufficient documentation

## 2020-05-18 DIAGNOSIS — Z923 Personal history of irradiation: Secondary | ICD-10-CM | POA: Diagnosis not present

## 2020-05-18 NOTE — Telephone Encounter (Signed)
Scheduled appointment per 11/8 los. Spoke to patient who is aware of appointment date and time. Gave patient calendar print out.  

## 2020-05-18 NOTE — Progress Notes (Signed)
Patient Care Team: Biagio Borg, MD as PCP - General Stanford Breed Denice Bors, MD as Consulting Physician (Cardiology) Deboraha Sprang, MD as Consulting Physician (Cardiology) Nicholas Lose, MD as Consulting Physician (Hematology and Oncology) Stark Klein, MD as Consulting Physician (General Surgery) Eppie Gibson, MD as Attending Physician (Radiation Oncology)  DIAGNOSIS:  Encounter Diagnosis  Name Primary?  . Malignant neoplasm of lower-outer quadrant of right breast of female, estrogen receptor negative ([place])     SUMMARY OF ONCOLOGIC HISTORY: Oncology History  Malignant neoplasm of lower-outer quadrant of right breast of female, estrogen receptor negative ([place])  08/29/2018 Initial Diagnosis   Screening mammogram detected 2 adjacent masses right breast 8:30 position LOQ 8 cm from nipple 1.1 cm and 5 mm, combined mass 1.7 cm, axilla negative, biopsy ([id number]): Grade 3 IDC triple negative, Ki-67 80%, T1CN0 stage 1B clinical stage   09/05/2018 Cancer Staging   Staging form: Breast, AJCC 8th Edition - Clinical stage from 09/05/2018: Stage IB (cT1c, cN0, cM0, G3, ER-, PR-, HER2-) - Signed by Nicholas Lose, MD on 09/05/2018   10/11/2018 Surgery   Right lumpectomy ([id number]): Grade 3 IDC, 1.6 cm, 0/3 lymph nodes negative, superior margin broadly positive, triple negative, Ki-67 80%, T1CN0 stage Ib. 0/3 lymph nodes positive for carcinoma.   10/16/2018 Cancer Staging   Staging form: Breast, AJCC 8th Edition - Pathologic stage from 10/16/2018: Stage IB (pT1c, pN0, cM0, G3, ER-, PR-, HER2-) - Signed by Nicholas Lose, MD on 10/16/2018   10/23/2018 Surgery   Reexcision [phone]): margins negative for carcinoma   11/15/2018 - 02/28/2019 Chemotherapy   The patient had palonosetron (ALOXI) injection 0.25 mg, 0.25 mg, Intravenous,  Once, 6 of 6 cycles Administration: 0.25 mg (11/15/2018), 0.25 mg (12/06/2018), 0.25 mg (12/27/2018), 0.25 mg (01/17/2019), 0.25 mg (02/07/2019), 0.25 mg (02/28/2019) methotrexate  (PF) chemo injection 65 mg, 84.5 mg, Intravenous,  Once, 6 of 6 cycles Dose modification: 30 mg/m2 (original dose 40 mg/m2, Cycle 2, Reason: Change in SCr/CrCl, Comment: CrCl 53), 25 mg/m2 (original dose 40 mg/m2, Cycle 5, Reason: Dose not tolerated) Administration: 65 mg (11/15/2018), 65 mg (12/06/2018), 65 mg (12/27/2018), 63.25 mg (01/17/2019), 53 mg (02/07/2019), 53 mg (02/28/2019) cyclophosphamide (CYTOXAN) 1,260 mg in sodium chloride 0.9 % 250 mL chemo infusion, 600 mg/m2 = 1,260 mg, Intravenous,  Once, 6 of 6 cycles Dose modification: 500 mg/m2 (original dose 600 mg/m2, Cycle 5, Reason: Provider Judgment) Administration: 1,260 mg (11/15/2018), 1,260 mg (12/06/2018), 1,260 mg (12/27/2018), 1,260 mg (01/17/2019), 1,060 mg (02/07/2019), 1,060 mg (02/28/2019) fluorouracil (ADRUCIL) chemo injection 1,250 mg, 600 mg/m2 = 1,250 mg, Intravenous,  Once, 6 of 6 cycles Dose modification: 400 mg/m2 (original dose 600 mg/m2, Cycle 5, Reason: Dose not tolerated) Administration: 1,250 mg (11/15/2018), 1,250 mg (12/06/2018), 1,250 mg (12/27/2018), 1,250 mg (01/17/2019), 850 mg (02/07/2019), 850 mg (02/28/2019)  for chemotherapy treatment.    03/26/2019 - 04/19/2019 Radiation Therapy   40.05 Gy in 15 fractions to the right breast with 2 tangential fields. Followed by a boost.     CHIEF COMPLIANT: Follow-up of history of breast cancer  INTERVAL HISTORY: [name] [name] is a 79 year old with above-mentioned history of triple negative breast cancer is currently on surveillance.  She reports no new pain or discomfort in the breast.  Mammograms have been coming out normal.  She continues to suffer from fatigue as well as metallic taste in the mouth.  She is trying to lose weight and has lost 5 pounds.   ALLERGIES:  is allergic to topamax [topiramate] and  zocor [simvastatin - high dose].  MEDICATIONS:  Current Outpatient Medications  Medication Sig Dispense Refill  . albuterol (PROVENTIL HFA;VENTOLIN HFA) 108 (90 Base) MCG/ACT  inhaler Inhale 1 puff into the lungs every 6 (six) hours as needed for wheezing or shortness of breath.    . carvedilol (COREG) 25 MG tablet TAKE 1 TABLET BY MOUTH TWICE A DAY 180 tablet 1  . cholecalciferol (VITAMIN D3) 25 MCG (1000 UT) tablet Take 1,000 Units by mouth daily.    [name] ENTRESTO 24-26 MG TAKE 1 TABLET BY MOUTH 2 (TWO) TIMES DAILY 60 tablet 3  . furosemide (LASIX) 40 MG tablet Take 1 tablet (40 mg total) by mouth 2 (two) times daily. 180 tablet 3  . ibuprofen (ADVIL) 800 MG tablet TAKE 1 TABLET (800 MG TOTAL) EVERY 8 (EIGHT) HOURS AS NEEDED BY MOUTH. 40 tablet 2  . lidocaine-prilocaine (EMLA) cream APPLY TO AFFECTED AREA ONCE AS DIRECTED 30 g 3  . meloxicam (MOBIC) 15 MG tablet TAKE 1 TABLET BY MOUTH EVERY DAY AS NEEDED FOR PAIN 90 tablet 3  . Multiple Vitamin (MULTIVITAMIN WITH MINERALS) TABS tablet Take 1 tablet by mouth daily.    . rizatriptan (MAXALT) 10 MG tablet Take 10 mg by mouth as needed for migraine. May repeat in 2 hours if needed    . spironolactone (ALDACTONE) 25 MG tablet Take 0.5 tablets (12.5 mg total) by mouth daily. 45 tablet 3  . traMADol (ULTRAM) 50 MG tablet TAKE 1 TABLET (50 MG TOTAL) BY MOUTH EVERY 6 (SIX) HOURS AS NEEDED. 60 tablet 2  . Vitamin D, Ergocalciferol, (DRISDOL) 1.25 MG (50000 UNIT) CAPS capsule Take 1 capsule (50,000 Units total) by mouth every 7 (seven) days. 12 capsule 0   No current facility-administered medications for this visit.    PHYSICAL EXAMINATION: ECOG PERFORMANCE STATUS: 1 - Symptomatic but completely ambulatory  Vitals:   05/18/20 0925  BP: 121/62  Pulse: 67  Resp: 18  Temp: 97.9 F (36.6 C)  SpO2: 99%   Filed Weights   05/18/20 0925  Weight: 225 lb 6.4 oz (102.2 kg)    BREAST: No palpable masses or nodules in either right or left breasts. No palpable axillary supraclavicular or infraclavicular adenopathy no breast tenderness or nipple discharge. (exam performed in the presence of a chaperone)  LABORATORY DATA:  I have  reviewed the data as listed CMP Latest Ref Rng & Units 04/03/2020 10/29/2019 05/03/2019  Glucose 65 - 99 mg/dL 100(H) 115(H) 102(H)  BUN 8 - 27 mg/dL _0 Creatinine 0.57 - 1.00 mg/dL 1.49(H) 1.41(H) 1.55(H)  Sodium 134 - 144 mmol/L 141 142 141  Potassium 3.5 - 5.2 mmol/L 4.6 5.4(H) 4.6  Chloride 96 - 106 mmol/L 102 105 106  CO2 20 - 29 mmol/L _1 Calcium 8.7 - 10.3 mg/dL 9.5 9.5 9.5  Total Protein 6.0 - 8.3 g/dL - 7.2 -  Total Bilirubin 0.2 - 1.2 mg/dL - 0.4 -  Alkaline Phos 39 - 117 U/L - 79 -  AST 0 - 37 U/L - 13 -  ALT 0 - 35 U/L - 9 -    Lab Results  Component Value Date   WBC 4.7 10/29/2019   HGB 11.2 (L) 10/29/2019   HCT 35.2 (L) 10/29/2019   MCV 82.8 10/29/2019   PLT 199.0 10/29/2019   NEUTROABS 2.4 10/29/2019    ASSESSMENT & PLAN:  Malignant neoplasm of lower-outer quadrant of right breast of female, estrogen receptor negative ([place]) 10/12/2018:Right lumpectomy: Grade  3 IDC, 1.6 cm, 0/3 lymph nodes negative, superior margin broadly positive, triple negative, Ki-67 80%, T1CN0 stage Ib  Review: Superior marginwas broadly positive: Patient had surgery 10/23/2018: Benign Peripheral blood flow cytometry: Monoclonal B-cell population kappa restricted lymphocytes CD20 positive but lacks CD5, CD10, CD23 or CD103: B-cell lymphoproliferative process  Treatment plan: 1.Adjuvant chemotherapy with CMF x6 cycles tentative start date 11/15/2018-02/28/2019 2.Followed by adjuvant radiation 03/26/2019-04/19/2019 ----------------------------------------------------------------------------------------------------------------------------------------------------- Breast cancer surveillance: 1.  Breast exam 05/18/2020 benign 2.  Mammogram 09/19/2019: [facility]: Benign breast density category B  Severe fatigue: B12 did not make a difference. Continues to have metallic taste in the mouth. Port scar: Still causing tenderness  Return to clinic in 1 year for follow-up    No orders of  the defined types were placed in this encounter.  The patient has a good understanding of the overall plan. she agrees with it. she will call with any problems that may develop before the next visit here. Total time spent: 30 mins including face to face time and time spent for planning, charting and co-ordination of care   Harriette Ohara, MD 05/18/20

## 2020-05-18 NOTE — Assessment & Plan Note (Signed)
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%, T1CN0 stage Ib  Review: Superior marginwas broadly positive: Patient had surgery 10/23/2018: Benign Peripheral blood flow cytometry: Monoclonal B-cell population kappa restricted lymphocytes CD20 positive but lacks CD5, CD10, CD23 or CD103: B-cell lymphoproliferative process  Treatment plan: 1.Adjuvant chemotherapy with CMF x6 cycles tentative start date 11/15/2018-02/28/2019 2.Followed by adjuvant radiation 03/26/2019-04/19/2019 ----------------------------------------------------------------------------------------------------------------------------------------------------- Breast cancer surveillance: 1.  Breast exam 05/18/2020 benign 2.  Mammogram 09/19/2019: [facility]: Benign breast density category B  Severe fatigue: I encouraged her to take over-the-counter B12.  If he does not get better with then I will set her up for B12 injections starting December. Return to clinic in 1 year for follow-up

## 2020-05-20 ENCOUNTER — Telehealth (HOSPITAL_COMMUNITY): Payer: Self-pay | Admitting: *Deleted

## 2020-05-20 NOTE — Telephone Encounter (Signed)
-----   Message from Deboraha Sprang, MD sent at 05/05/2020  7:31 PM EDT ----- Please Inform Patient that  CT is abnormal but the degree to which it is responsible is not so clear to me   lets arrange followup with her PCP    Thanks

## 2020-05-20 NOTE — Telephone Encounter (Signed)
Pt returned phone call.  Spoke with pt and advised per Dr Caryl Comes pt will need f/u with PCP regarding Chest CT results.  Pt states she has an appointment scheduled with PCP next week and will have PCP review CT.  Pt thanked Therapist, sports for the call.

## 2020-05-20 NOTE — Telephone Encounter (Signed)
Contacted patient to [facility] Cardiopulmonary Exercise Test on 11/22 at 11am. Patient unavailable, left voicemail for patient to return call at [phone] to reschedule CPX.  Will continue to try to schedule patient.   Landis Martins, MS, ACSM-RCEP Clinical Exercise Physiologist

## 2020-05-20 NOTE — Telephone Encounter (Signed)
Attempted phone call to pt.  Left voicemail message to contact RN at [phone]. 

## 2020-05-26 NOTE — Telephone Encounter (Signed)
Again- attempted to reach patient to reschedule appointment 11/22 at 11am. Left voicemail for patient to call [phone] to reschedule appointment.    Landis Martins, MS, ACSM, [facility] Clinical Exercise Physiologist/ Health and Wellness Coach

## 2020-05-27 ENCOUNTER — Other Ambulatory Visit: Payer: Self-pay

## 2020-05-27 ENCOUNTER — Encounter: Payer: Self-pay | Admitting: Internal Medicine

## 2020-05-27 ENCOUNTER — Ambulatory Visit (INDEPENDENT_AMBULATORY_CARE_PROVIDER_SITE_OTHER): Payer: Medicare Other | Admitting: Internal Medicine

## 2020-05-27 VITALS — BP 102/70 | HR 62 | Temp 97.8°F | Ht 67.0 in | Wt 226.0 lb

## 2020-05-27 DIAGNOSIS — R739 Hyperglycemia, unspecified: Secondary | ICD-10-CM | POA: Diagnosis not present

## 2020-05-27 DIAGNOSIS — N183 Chronic kidney disease, stage 3 unspecified: Secondary | ICD-10-CM

## 2020-05-27 DIAGNOSIS — I10 Essential (primary) hypertension: Secondary | ICD-10-CM | POA: Diagnosis not present

## 2020-05-27 DIAGNOSIS — E7800 Pure hypercholesterolemia, unspecified: Secondary | ICD-10-CM

## 2020-05-27 DIAGNOSIS — E538 Deficiency of other specified B group vitamins: Secondary | ICD-10-CM

## 2020-05-27 DIAGNOSIS — R911 Solitary pulmonary nodule: Secondary | ICD-10-CM | POA: Diagnosis not present

## 2020-05-27 NOTE — Patient Instructions (Signed)
We can hold off on further imaging or other evaluation for the pulm nodule at this time  Please continue all other medications as before, and refills have been done if requested.  Please have the pharmacy call with any other refills you may need.  Please continue your efforts at being more active, low cholesterol diet, and weight control.  Please keep your appointments with your specialists as you may have planned  Please make an Appointment to return in 6 months, or sooner if needed, also with Lab Appointment for testing done 3-5 days before at the [facility] (so this is for TWO appointments - please see the scheduling desk as you leave)

## 2020-05-27 NOTE — Progress Notes (Signed)
Subjective:    Patient ID: [name] [name], female    DOB: Jan 06, 1941, 79 y.o.   MRN: [id number]  HPI  Here to f/u; overall doing ok,  Pt denies chest pain, increasing sob or doe, wheezing, orthopnea, PND, increased LE swelling, palpitations, dizziness or syncope.  Pt denies new neurological symptoms such as new headache, or facial or extremity weakness or numbness.  Pt denies polydipsia, polyuria, or low sugar episode.  Pt states overall good compliance with meds, mostly trying to follow appropriate diet, with wt overall stable,  but little exercise however. Has f/u with renal early 2022.  Had recent imaging with 3 mm nodule and cards asked pt to f/u here.   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   OVERACTIVE BLADDER 08/03/2010   PYELONEPHRITIS, HX OF 52/84/1324   SYSTOLIC HEART FAILURE, CHRONIC 12/28/2008   VITAMIN D DEFICIENCY 06/17/2008   Past Surgical History:  Procedure Laterality Date   ABDOMINAL HYSTERECTOMY     BREAST LUMPECTOMY WITH RADIOACTIVE SEED AND SENTINEL LYMPH NODE BIOPSY Right 10/11/2018   Procedure: RIGHT BREAST LUMPECTOMY WITH RADIOACTIVE SEED AND SENTINEL LYMPH NODE BIOPSY;  Surgeon: Stark Klein, MD;  Location: [place];  Service: General;  Laterality: Right;   ICD GENERATOR CHANGEOUT N/A 10/25/2017   Procedure: ICD GENERATOR CHANGEOUT;  Surgeon: Deboraha Sprang, MD;  Location: [place] [facility];  Service: Cardiovascular;  Laterality: N/A;   LAMINECTOMY     PORT-A-CATH REMOVAL N/A  05/08/2019   Procedure: REMOVAL OF PORT-A-CATH RIGHT CHEST;  Surgeon: Stark Klein, MD;  Location: [place];  Service: General;  Laterality: N/A;   PORTACATH PLACEMENT N/A 10/11/2018   Procedure: INSERTION PORT-A-CATH;  Surgeon: Stark Klein, MD;  Location: [place];  Service: General;  Laterality: N/A;   RE-EXCISION OF BREAST LUMPECTOMY Right 10/23/2018   Procedure: RE-EXCISION OF RIGHT BREAST LUMPECTOMY;  Surgeon: Stark Klein, MD;  Location: [place];  Service: General;  Laterality: Right;    reports that she has never smoked. She has never used smokeless tobacco. She reports that she does not drink alcohol and does not use drugs. family history includes Arthritis in an other family member; Coronary artery disease in an other family member; Heart disease in her mother; Kidney disease in an other family member. Allergies  Allergen Reactions   Topamax [Topiramate] Other (See Comments)    Confusion, gets lost going home   Zocor [Simvastatin - High Dose] Nausea Only    Nausea    Current Outpatient Medications on File Prior to Visit  Medication Sig Dispense Refill   albuterol (PROVENTIL HFA;VENTOLIN HFA) 108 (90 Base) MCG/ACT inhaler Inhale 1 puff into the lungs every 6 (six) hours as needed for wheezing or shortness of breath.     carvedilol (COREG) 25 MG tablet TAKE 1 TABLET BY MOUTH TWICE A DAY 180 tablet 1   cholecalciferol (VITAMIN D3) 25 MCG (1000 UT) tablet Take 1,000 Units by mouth daily.     ENTRESTO 24-26 MG TAKE 1 TABLET BY MOUTH 2 (TWO) TIMES DAILY 60 tablet 3  furosemide (LASIX) 40 MG tablet Take 1 tablet (40 mg total) by mouth 2 (two) times daily. 180 tablet 3   ibuprofen (ADVIL) 800 MG tablet TAKE 1 TABLET (800 MG TOTAL) EVERY 8 (EIGHT) HOURS AS NEEDED BY MOUTH. 40 tablet 2   lidocaine-prilocaine (EMLA) cream APPLY TO AFFECTED AREA ONCE AS DIRECTED 30 g 3   meloxicam (MOBIC) 15 MG tablet TAKE 1 TABLET BY MOUTH EVERY DAY AS NEEDED FOR PAIN 90 tablet 3   Multiple Vitamin  (MULTIVITAMIN WITH MINERALS) TABS tablet Take 1 tablet by mouth daily.     rizatriptan (MAXALT) 10 MG tablet Take 10 mg by mouth as needed for migraine. May repeat in 2 hours if needed     spironolactone (ALDACTONE) 25 MG tablet Take 0.5 tablets (12.5 mg total) by mouth daily. 45 tablet 3   traMADol (ULTRAM) 50 MG tablet TAKE 1 TABLET (50 MG TOTAL) BY MOUTH EVERY 6 (SIX) HOURS AS NEEDED. 60 tablet 2   [DISCONTINUED] prochlorperazine (COMPAZINE) 10 MG tablet TAKE 1 TABLET (10 MG TOTAL) BY MOUTH EVERY 6 (SIX) HOURS AS NEEDED (NAUSEA OR VOMITING). 30 tablet 1   No current facility-administered medications on file prior to visit.   Review of Systems All otherwise neg per pt    Objective:   Physical Exam BP 102/70 (BP Location: Left Arm, Patient Position: Sitting, Cuff Size: Large)    Pulse 62    Temp 97.8 F (36.6 C) (Oral)    Ht 5\' 7"  (1.702 m)    Wt 226 lb (102.5 kg)    SpO2 96%    BMI 35.40 kg/m  VS noted,  Constitutional: Pt appears in NAD HENT: Head: NCAT.  Right Ear: External ear normal.  Left Ear: External ear normal.  Eyes: . Pupils are equal, round, and reactive to light. Conjunctivae and EOM are normal Nose: without d/c or deformity Neck: Neck supple. Gross normal ROM Cardiovascular: Normal rate and regular rhythm.   Pulmonary/Chest: Effort normal and breath sounds without rales or wheezing.  Abd:  Soft, NT, ND, + BS, no organomegaly Neurological: Pt is alert. At baseline orientation, motor grossly intact Skin: Skin is warm. No rashes, other new lesions, no LE edema Psychiatric: Pt behavior is normal without agitation  All otherwise neg per pt Lab Results  Component Value Date   WBC 4.7 10/29/2019   HGB 11.2 (L) 10/29/2019   HCT 35.2 (L) 10/29/2019   PLT 199.0 10/29/2019   GLUCOSE 100 (H) 04/03/2020   CHOL 204 (H) 10/29/2019   TRIG 133.0 10/29/2019   HDL 51.20 10/29/2019   LDLDIRECT 129.7 02/07/2013   LDLCALC 126 (H) 10/29/2019   ALT 9 10/29/2019   AST 13  10/29/2019   NA 141 04/03/2020   K 4.6 04/03/2020   CL 102 04/03/2020   CREATININE 1.49 (H) 04/03/2020   BUN 19 04/03/2020   CO2 27 04/03/2020   TSH 1.05 10/29/2019   INR 0.9 RATIO 06/18/2007   HGBA1C 6.0 10/29/2019   MICROALBUR 1.4 10/29/2019      Assessment & Plan:

## 2020-05-27 NOTE — Progress Notes (Signed)
HPI: FU nonischemic cardiomyopathy.Cardiac catheterization August 2004 showed normal coronary arteries.She had an ICD placed on June 25, 2007. 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 Patient was referred to Dr. Lovena Le by Dr. Caryl Comes for consideration of ablation but Dr. Lovena Le felt medical therapy indicated.Nuclear study repeated April 2018 and showed ejection fraction 35% with no ischemia or infarction.Last echocardiogram March 2020 showed ejection fraction 20 to 25%, severe left ventricular enlargement, grade 2 diastolic dysfunction, mild left atrial enlargement. Since I last saw her,she has some dyspnea on exertion unchanged.  Minimal orthopnea.  No pedal edema, chest pain, palpitations or syncope.  Current Outpatient Medications  Medication Sig Dispense Refill  . albuterol (PROVENTIL HFA;VENTOLIN HFA) 108 (90 Base) MCG/ACT inhaler Inhale 1 puff into the lungs every 6 (six) hours as needed for wheezing or shortness of breath.    . carvedilol (COREG) 25 MG tablet TAKE 1 TABLET BY MOUTH TWICE A DAY 180 tablet 1  . cholecalciferol (VITAMIN D3) 25 MCG (1000 UT) tablet Take 1,000 Units by mouth daily.    [name] ENTRESTO 24-26 MG TAKE 1 TABLET BY MOUTH 2 (TWO) TIMES DAILY 60 tablet 3  . furosemide (LASIX) 40 MG tablet Take 1 tablet (40 mg total) by mouth 2 (two) times daily. 180 tablet 3  . ibuprofen (ADVIL) 800 MG tablet TAKE 1 TABLET (800 MG TOTAL) EVERY 8 (EIGHT) HOURS AS NEEDED BY MOUTH. 40 tablet 2  . lidocaine-prilocaine (EMLA) cream APPLY TO AFFECTED AREA ONCE AS DIRECTED 30 g 3  . meloxicam (MOBIC) 15 MG tablet TAKE 1 TABLET BY MOUTH EVERY DAY AS NEEDED FOR PAIN 90 tablet 3  . Multiple Vitamin (MULTIVITAMIN WITH MINERALS) TABS tablet Take 1 tablet by mouth daily.    . rizatriptan (MAXALT) 10 MG tablet Take 10 mg by mouth as needed for migraine. May repeat in 2 hours if needed    . spironolactone (ALDACTONE) 25 MG tablet Take 0.5 tablets (12.5 mg  total) by mouth daily. 45 tablet 3  . traMADol (ULTRAM) 50 MG tablet TAKE 1 TABLET (50 MG TOTAL) BY MOUTH EVERY 6 (SIX) HOURS AS NEEDED. 60 tablet 2   No current facility-administered medications for this visit.     Past Medical History:  Diagnosis Date  . AICD (automatic cardioverter/defibrillator) present   . ALLERGIC RHINITIS 11/06/2007  . 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 ANEMIA-NOS 06/17/2008  . ANXIETY 02/22/2008  . CARDIOMYOPATHY, PRIMARY, DILATED 12/30/2008  . [place] DISEASE, 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  . GERD 05/07/2007  . History of kidney stones   . HYPERLIPIDEMIA 05/23/2007  . HYPERTENSION 05/07/2007  . 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 MENOPAUSAL DISORDER 05/06/2008  . 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 OVERACTIVE BLADDER 08/03/2010  . PYELONEPHRITIS, HX OF 05/07/2007  . SYSTOLIC HEART FAILURE, CHRONIC 12/28/2008  . VITAMIN D DEFICIENCY 06/17/2008    Past Surgical History:  Procedure Laterality Date  . ABDOMINAL HYSTERECTOMY    . BREAST LUMPECTOMY WITH RADIOACTIVE SEED AND SENTINEL LYMPH NODE BIOPSY Right 10/11/2018   Procedure: RIGHT BREAST LUMPECTOMY WITH RADIOACTIVE SEED AND SENTINEL LYMPH NODE BIOPSY;  Surgeon: Stark Klein, MD;  Location: [place];  Service: General;  Laterality: Right;  . ICD GENERATOR CHANGEOUT N/A 10/25/2017   Procedure: ICD GENERATOR CHANGEOUT;  Surgeon: Deboraha Sprang, MD;  Location: [place] [facility];  Service: Cardiovascular;  Laterality: N/A;  . LAMINECTOMY    . PORT-A-CATH REMOVAL N/A 05/08/2019   Procedure: REMOVAL OF PORT-A-CATH RIGHT CHEST;  Surgeon: Stark Klein, MD;  Location: [place];  Service: General;  Laterality: N/A;  . PORTACATH PLACEMENT N/A 10/11/2018   Procedure: INSERTION PORT-A-CATH;  Surgeon: Stark Klein, MD;  Location: [place];  Service: General;  Laterality: N/A;  . RE-EXCISION OF BREAST LUMPECTOMY Right 10/23/2018   Procedure: RE-EXCISION OF RIGHT BREAST LUMPECTOMY;  Surgeon: Stark Klein, MD;  Location:  [place];  Service: General;  Laterality: Right;    Social History   Socioeconomic History  . Marital status: Widowed    Spouse name: Not on file  . Number of children: 3  . Years of education: Not on file  . Highest education level: Not on file  Occupational History  . Occupation: retired Ambulance person: RETIRED  Tobacco Use  . Smoking status: Never Smoker  . Smokeless tobacco: Never Used  Substance and Sexual Activity  . Alcohol use: No  . Drug use: No  . Sexual activity: Never  Other Topics Concern  . Not on file  Social History Narrative  . Not on file   Social Determinants of Health   Financial Resource Strain:   . Difficulty of Paying Living Expenses: Not on file  Food Insecurity:   . Worried About Charity fundraiser in the Last Year: Not on file  . Ran Out of Food in the Last Year: Not on file  Transportation Needs:   . Lack of Transportation (Medical): Not on file  . Lack of Transportation (Non-Medical): Not on file  Physical Activity:   . Days of Exercise per Week: Not on file  . Minutes of Exercise per Session: Not on file  Stress:   . Feeling of Stress : Not on file  Social Connections:   . Frequency of Communication with Friends and Family: Not on file  . Frequency of Social Gatherings with Friends and Family: Not on file  . Attends Religious Services: Not on file  . Active Member of Clubs or Organizations: Not on file  . Attends Archivist Meetings: Not on file  . Marital Status: Not on file  Intimate Partner Violence:   . Fear of Current or Ex-Partner: Not on file  . Emotionally Abused: Not on file  . Physically Abused: Not on file  . Sexually Abused: Not on file    Family History  Problem Relation Age of Onset  . Arthritis Other   . Coronary artery disease Other   . Kidney disease Other   . Heart disease Mother     ROS: no fevers or chills, productive cough, hemoptysis, dysphasia, odynophagia, melena, hematochezia,  dysuria, hematuria, rash, seizure activity, orthopnea, PND, pedal edema, claudication. Remaining systems are negative.  Physical Exam: Well-developed well-nourished in no acute distress.  Skin is warm and dry.  HEENT is normal.  Neck is supple.  Chest is clear to auscultation with normal expansion.  Cardiovascular exam is regular rate and rhythm.  Abdominal exam nontender or distended. No masses palpated. Extremities show no edema. neuro grossly intact   A/P  1 cardiomyopathy-plan to continue Entresto, beta-blocker and spironolactone.  2 chronic systolic congestive heart failure-patient appears to be euvolemic on examination today.  Continue diuretics at present dose.   3 hypertension-patient's blood pressure is controlled today.  Continue present medications and follow.  4 PVCs-continue beta-blocker.  Previously seen by Dr. Lovena Le and ablation not pursued.  5 hyperlipidemia-continue statin.  6 prior ICD-Per Dr. Caryl Comes.  Kirk Ruths, MD

## 2020-05-30 DIAGNOSIS — R911 Solitary pulmonary nodule: Secondary | ICD-10-CM | POA: Insufficient documentation

## 2020-05-30 NOTE — Assessment & Plan Note (Signed)
stable overall by history and exam, recent data reviewed with pt, and pt to continue medical treatment as before,  to f/u any worsening symptoms or concerns  

## 2020-05-30 NOTE — Assessment & Plan Note (Addendum)
At 3 mm is less than 8 mm and very unlikley to need specific f/u imaging per guidelines  I spent 31 minutes in preparing to see the patient by review of recent labs, imaging and procedures, obtaining and reviewing separately obtained history, communicating with the patient and family or caregiver, ordering medications, tests or procedures, and documenting clinical information in the EHR including the differential Dx, treatment, and any further evaluation and other management of pulm nodule, htn, dm, hld, ckd

## 2020-05-30 NOTE — Addendum Note (Signed)
Addended by: Biagio Borg on: 05/30/2020 07:39 PM   Modules accepted: Orders

## 2020-06-01 ENCOUNTER — Encounter (HOSPITAL_COMMUNITY): Payer: Medicare Other

## 2020-06-08 ENCOUNTER — Other Ambulatory Visit: Payer: Self-pay

## 2020-06-08 ENCOUNTER — Encounter: Payer: Self-pay | Admitting: Cardiology

## 2020-06-08 ENCOUNTER — Ambulatory Visit: Payer: Medicare Other | Admitting: Cardiology

## 2020-06-08 VITALS — BP 118/70 | HR 68 | Ht 67.0 in | Wt 222.0 lb

## 2020-06-08 DIAGNOSIS — I428 Other cardiomyopathies: Secondary | ICD-10-CM | POA: Diagnosis not present

## 2020-06-08 DIAGNOSIS — I5022 Chronic systolic (congestive) heart failure: Secondary | ICD-10-CM

## 2020-06-08 DIAGNOSIS — I10 Essential (primary) hypertension: Secondary | ICD-10-CM | POA: Diagnosis not present

## 2020-06-08 DIAGNOSIS — Z95810 Presence of automatic (implantable) cardiac defibrillator: Secondary | ICD-10-CM | POA: Diagnosis not present

## 2020-06-08 DIAGNOSIS — I493 Ventricular premature depolarization: Secondary | ICD-10-CM

## 2020-06-08 NOTE — Patient Instructions (Signed)

## 2020-06-23 ENCOUNTER — Other Ambulatory Visit: Payer: Self-pay | Admitting: Internal Medicine

## 2020-06-23 NOTE — Telephone Encounter (Signed)
Please refill as per office routine med refill policy (all routine meds refilled for 3 mo or monthly per pt preference up to one year from last visit, then month to month grace period for 3 mo, then further med refills will have to be denied)  

## 2020-06-30 ENCOUNTER — Ambulatory Visit: Payer: Medicare Other

## 2020-07-20 ENCOUNTER — Encounter (HOSPITAL_COMMUNITY): Payer: Medicare Other

## 2020-08-04 ENCOUNTER — Ambulatory Visit (INDEPENDENT_AMBULATORY_CARE_PROVIDER_SITE_OTHER): Payer: Medicare Other

## 2020-08-04 DIAGNOSIS — I428 Other cardiomyopathies: Secondary | ICD-10-CM

## 2020-08-04 DIAGNOSIS — I5022 Chronic systolic (congestive) heart failure: Secondary | ICD-10-CM

## 2020-08-05 LAB — CUP PACEART REMOTE DEVICE CHECK
Battery Remaining Longevity: 108 mo
Battery Voltage: 3 V
Brady Statistic RV Percent Paced: 0.01 %
Date Time Interrogation Session: 20220126003426
HighPow Impedance: 43 Ohm
HighPow Impedance: 53 Ohm
Implantable Lead Implant Date: 20081215
Implantable Lead Location: 753860
Implantable Lead Model: 6947
Implantable Pulse Generator Implant Date: 20190418
Lead Channel Impedance Value: 380 Ohm
Lead Channel Impedance Value: 494 Ohm
Lead Channel Pacing Threshold Amplitude: 0.875 V
Lead Channel Pacing Threshold Pulse Width: 0.4 ms
Lead Channel Sensing Intrinsic Amplitude: 23 mV
Lead Channel Sensing Intrinsic Amplitude: 23 mV
Lead Channel Setting Pacing Amplitude: 2.5 V
Lead Channel Setting Pacing Pulse Width: 0.4 ms
Lead Channel Setting Sensing Sensitivity: 0.3 mV

## 2020-08-15 NOTE — Progress Notes (Signed)
Remote ICD transmission.   

## 2020-08-21 ENCOUNTER — Telehealth: Payer: Self-pay | Admitting: Internal Medicine

## 2020-08-21 ENCOUNTER — Telehealth: Payer: Self-pay | Admitting: Cardiology

## 2020-08-21 NOTE — Telephone Encounter (Signed)
Pt updated and verbalized understanding.  

## 2020-08-21 NOTE — Telephone Encounter (Signed)
Patient called and wanted to know what medicine she could take. She says that she has a sinus affection or a bad cold. Please call back

## 2020-08-21 NOTE — Telephone Encounter (Signed)
Saline nasal rinse (either a spary or something like a netti pot) would be good to flush sinuses.  She could take an allergy pill like claritin or zyrtec Should AVOID any sudafed or sudafed pe products Could also use flonase nasal spray

## 2020-08-21 NOTE — Telephone Encounter (Signed)
Saline nose spray

## 2020-08-21 NOTE — Telephone Encounter (Signed)
Patient is having some congestion and runny nose with some sinus pressure. She is taking zyrtec OTC but is wondering what else she can take OTC

## 2020-08-21 NOTE — Telephone Encounter (Signed)
Return call to pt. She report she believe she currently has a sinus infection and questioning what she can take and if any medication would interact with her cardiac meds.   Nurse advised pt to contact pcp but will route to Pharm D for any interactions.

## 2020-08-24 ENCOUNTER — Telehealth: Payer: Self-pay

## 2020-08-24 ENCOUNTER — Encounter (HOSPITAL_COMMUNITY): Payer: Medicare Other

## 2020-08-24 NOTE — Telephone Encounter (Signed)
Patient notified to try saliene nose spray.

## 2020-09-19 ENCOUNTER — Other Ambulatory Visit: Payer: Self-pay | Admitting: Internal Medicine

## 2020-09-22 ENCOUNTER — Encounter (HOSPITAL_COMMUNITY): Payer: Medicare Other

## 2020-09-22 DIAGNOSIS — Z853 Personal history of malignant neoplasm of breast: Secondary | ICD-10-CM | POA: Diagnosis not present

## 2020-09-28 ENCOUNTER — Encounter: Payer: Self-pay | Admitting: Adult Health

## 2020-09-28 ENCOUNTER — Other Ambulatory Visit: Payer: Self-pay

## 2020-09-28 ENCOUNTER — Encounter: Payer: Self-pay | Admitting: Internal Medicine

## 2020-09-29 ENCOUNTER — Ambulatory Visit (HOSPITAL_COMMUNITY): Payer: Medicare Other | Attending: Internal Medicine

## 2020-09-29 DIAGNOSIS — R0600 Dyspnea, unspecified: Secondary | ICD-10-CM | POA: Diagnosis not present

## 2020-10-01 ENCOUNTER — Ambulatory Visit (INDEPENDENT_AMBULATORY_CARE_PROVIDER_SITE_OTHER): Payer: Medicare Other | Admitting: Internal Medicine

## 2020-10-01 ENCOUNTER — Telehealth: Payer: Self-pay

## 2020-10-01 ENCOUNTER — Other Ambulatory Visit: Payer: Self-pay

## 2020-10-01 ENCOUNTER — Encounter: Payer: Self-pay | Admitting: Internal Medicine

## 2020-10-01 DIAGNOSIS — C50511 Malignant neoplasm of lower-outer quadrant of right female breast: Secondary | ICD-10-CM

## 2020-10-01 DIAGNOSIS — M25561 Pain in right knee: Secondary | ICD-10-CM | POA: Diagnosis not present

## 2020-10-01 DIAGNOSIS — E559 Vitamin D deficiency, unspecified: Secondary | ICD-10-CM | POA: Diagnosis not present

## 2020-10-01 DIAGNOSIS — R739 Hyperglycemia, unspecified: Secondary | ICD-10-CM

## 2020-10-01 DIAGNOSIS — I10 Essential (primary) hypertension: Secondary | ICD-10-CM | POA: Diagnosis not present

## 2020-10-01 MED ORDER — TRAMADOL HCL 50 MG PO TABS: 50.0000 mg | ORAL_TABLET | Freq: Four times a day (QID) | ORAL | 2 refills | Status: DC | PRN

## 2020-10-01 MED ORDER — LIDOCAINE-PRILOCAINE 2.5-2.5 % EX CREA: TOPICAL_CREAM | CUTANEOUS | 3 refills | Status: DC

## 2020-10-01 NOTE — Progress Notes (Signed)
Patient ID: [name] [name], female   DOB: January 09, 1941, 80 y.o.   MRN: [id number]        Chief Complaint: follow up bilat knee pain right > left, vit d def, htn, hyperglycemia       HPI:  [name] [name] is a 80 y.o. female here with c/o > 3 mo onset worsening bilateral knee pain with intermittent effusion on the right side, now mod to severe, constant, sharp and dull, better to rest, worse to walk and cannot stand or even sit either for more than 3 min, has to keep moving due to the pain.  Cannot have MRI due to AICD, TKR not being pursued.  No recent giveaways or falls  Pt denies chest pain, increased sob or doe, wheezing, orthopnea, PND, increased LE swelling, palpitations, dizziness or syncope.   Pt denies polydipsia, polyuria, Denies new worsening focal neuro s/s.   Pt denies fever, wt loss, night sweats, loss of appetite, or other constitutional symptoms  . Not taking Vit D recently    Wt Readings from Last 3 Encounters:  10/01/20 229 lb (103.9 kg)  06/08/20 222 lb (100.7 kg)  05/27/20 226 lb (102.5 kg)   BP Readings from Last 3 Encounters:  10/01/20 118/60  06/08/20 118/70  05/27/20 102/70         Past Medical History:  Diagnosis Date  . AICD (automatic cardioverter/defibrillator) present   . ALLERGIC RHINITIS 11/06/2007  . 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 ANEMIA-NOS 06/17/2008  . ANXIETY 02/22/2008  . CARDIOMYOPATHY, PRIMARY, DILATED 12/30/2008  . [place] DISEASE, 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  . GERD 05/07/2007  . History of kidney stones   . HYPERLIPIDEMIA 05/23/2007  . HYPERTENSION 05/07/2007  . 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 MENOPAUSAL DISORDER 05/06/2008  . 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 OVERACTIVE BLADDER 08/03/2010  . PYELONEPHRITIS, HX OF 05/07/2007  . SYSTOLIC HEART FAILURE, CHRONIC 12/28/2008  . VITAMIN D DEFICIENCY 06/17/2008   Past Surgical History:  Procedure Laterality Date  . ABDOMINAL HYSTERECTOMY    . BREAST LUMPECTOMY WITH RADIOACTIVE  SEED AND SENTINEL LYMPH NODE BIOPSY Right 10/11/2018   Procedure: RIGHT BREAST LUMPECTOMY WITH RADIOACTIVE SEED AND SENTINEL LYMPH NODE BIOPSY;  Surgeon: Stark Klein, MD;  Location: [place];  Service: General;  Laterality: Right;  . ICD GENERATOR CHANGEOUT N/A 10/25/2017   Procedure: ICD GENERATOR CHANGEOUT;  Surgeon: Deboraha Sprang, MD;  Location: [place] [facility];  Service: Cardiovascular;  Laterality: N/A;  . LAMINECTOMY    . PORT-A-CATH REMOVAL N/A 05/08/2019   Procedure: REMOVAL OF PORT-A-CATH RIGHT CHEST;  Surgeon: Stark Klein, MD;  Location: [place];  Service: General;  Laterality: N/A;  . PORTACATH PLACEMENT N/A 10/11/2018   Procedure: INSERTION PORT-A-CATH;  Surgeon: Stark Klein, MD;  Location: [place];  Service: General;  Laterality: N/A;  . RE-EXCISION OF BREAST LUMPECTOMY Right 10/23/2018   Procedure: RE-EXCISION OF RIGHT BREAST LUMPECTOMY;  Surgeon: Stark Klein, MD;  Location: [place];  Service: General;  Laterality: Right;    reports that she has never smoked. She has never used smokeless tobacco. She reports that she does not drink alcohol and does not use drugs. family history includes Arthritis in an other family member; Coronary artery disease in an other family member; Heart disease in her mother; Kidney disease in an other family member. Allergies  Allergen Reactions  . Topamax [Topiramate] Other (See Comments)    Confusion, gets lost going home  . Zocor [Simvastatin - High Dose] Nausea  Only    Nausea    Current Outpatient Medications on File Prior to Visit  Medication Sig Dispense Refill  . albuterol (PROVENTIL HFA;VENTOLIN HFA) 108 (90 Base) MCG/ACT inhaler Inhale 1 puff into the lungs every 6 (six) hours as needed for wheezing or shortness of breath.    . carvedilol (COREG) 25 MG tablet TAKE 1 TABLET BY MOUTH TWICE A DAY 180 tablet 1  . cholecalciferol (VITAMIN D3) 25 MCG (1000 UT) tablet Take 1,000 Units by mouth daily.    [name] ENTRESTO 24-26 MG TAKE 1 TABLET BY MOUTH 2  (TWO) TIMES DAILY 60 tablet 3  . estradiol (ESTRACE) 1 MG tablet Take 1 mg by mouth daily.    . furosemide (LASIX) 40 MG tablet Take 1 tablet (40 mg total) by mouth 2 (two) times daily. 180 tablet 3  . ibuprofen (ADVIL) 800 MG tablet TAKE 1 TABLET (800 MG TOTAL) EVERY 8 (EIGHT) HOURS AS NEEDED BY MOUTH. 40 tablet 2  . meloxicam (MOBIC) 15 MG tablet TAKE 1 TABLET BY MOUTH EVERY DAY AS NEEDED FOR PAIN 90 tablet 3  . Multiple Vitamin (MULTIVITAMIN WITH MINERALS) TABS tablet Take 1 tablet by mouth daily.    . rizatriptan (MAXALT) 10 MG tablet Take 10 mg by mouth as needed for migraine. May repeat in 2 hours if needed    . spironolactone (ALDACTONE) 25 MG tablet Take 0.5 tablets (12.5 mg total) by mouth daily. 45 tablet 3  . [DISCONTINUED] prochlorperazine (COMPAZINE) 10 MG tablet TAKE 1 TABLET (10 MG TOTAL) BY MOUTH EVERY 6 (SIX) HOURS AS NEEDED (NAUSEA OR VOMITING). 30 tablet 1   No current facility-administered medications on file prior to visit.        ROS:  All others reviewed and negative.  Objective        PE:  BP 118/60   Pulse 68   Temp 98.6 F (37 C) (Oral)   Ht 5\' 7"  (1.702 m)   Wt 229 lb (103.9 kg)   SpO2 96%   BMI 35.87 kg/m                 Constitutional: Pt appears in NAD               HENT: Head: NCAT.                Right Ear: External ear normal.                 Left Ear: External ear normal.                Eyes: . Pupils are equal, round, and reactive to light. Conjunctivae and EOM are normal               Nose: without d/c or deformity               Neck: Neck supple. Gross normal ROM               Cardiovascular: Normal rate and regular rhythm.                 Pulmonary/Chest: Effort normal and breath sounds without rales or wheezing.                Abd:  Soft, NT, ND, + BS, no organomegaly               Neurological: Pt is alert. At baseline orientation, motor grossly intact  Skin: Skin is warm. No rashes, no other new lesions, LE edema - trace  bilat               bilat knee with degenerative bony changes right > left with trace right effusion                Psychiatric: Pt behavior is normal without agitation   Micro: none  Cardiac tracings I have personally interpreted today:  none  Pertinent Radiological findings (summarize): none   Lab Results  Component Value Date   WBC 4.7 10/29/2019   HGB 11.2 (L) 10/29/2019   HCT 35.2 (L) 10/29/2019   PLT 199.0 10/29/2019   GLUCOSE 100 (H) 04/03/2020   CHOL 204 (H) 10/29/2019   TRIG 133.0 10/29/2019   HDL 51.20 10/29/2019   LDLDIRECT 129.7 02/07/2013   LDLCALC 126 (H) 10/29/2019   ALT 9 10/29/2019   AST 13 10/29/2019   NA 141 04/03/2020   K 4.6 04/03/2020   CL 102 04/03/2020   CREATININE 1.49 (H) 04/03/2020   BUN 19 04/03/2020   CO2 27 04/03/2020   TSH 1.05 10/29/2019   INR 0.9 RATIO 06/18/2007   HGBA1C 6.0 10/29/2019   MICROALBUR 1.4 10/29/2019   Assessment/Plan:  LIAHNA BRICKNER is a 80 y.o. Black or African American [2] female with  has a past medical history of AICD (automatic cardioverter/defibrillator) present, ALLERGIC RHINITIS (11/06/2007), ANEMIA-IRON DEFICIENCY (05/23/2007), ANEMIA-NOS (06/17/2008), ANXIETY (02/22/2008), CARDIOMYOPATHY, PRIMARY, DILATED (12/30/2008), DEGENERATIVE DISC DISEASE, LUMBAR SPINE (05/07/2007), GERD (05/07/2007), History of kidney stones, HYPERLIPIDEMIA (05/23/2007), HYPERTENSION (05/07/2007), KELOID SCAR, HX OF (02/02/2009), MENOPAUSAL DISORDER (05/06/2008), OSTEOARTHRITIS, KNEES, BILATERAL (05/23/2007), OVERACTIVE BLADDER (08/03/2010), PYELONEPHRITIS, HX OF (92/07/69), SYSTOLIC HEART FAILURE, CHRONIC (12/28/2008), and VITAMIN D DEFICIENCY (06/17/2008).  Right knee pain Has bilat knee pain, worse on the right especially and life and activity limiting, for ultram prn, lidocaine cream topical, and f/u Dr Tamala Julian for gel shots,  to f/u any worsening symptoms or concerns  Vitamin D deficiency Last vitamin D Lab Results  Component Value Date    VD25OH 29.18 (L) 10/29/2019   Has been low, to start oral replacement  Hyperglycemia Lab Results  Component Value Date   HGBA1C 6.0 10/29/2019   Stable, pt to continue current medical treatment  - diet , wt control  Essential hypertension BP Readings from Last 3 Encounters:  10/01/20 118/60  06/08/20 118/70  05/27/20 102/70   Stable, pt to continue medical treatment coreg, entresto, aldactone   Followup: Return in about 8 weeks (around 11/25/2020).  Cathlean Cower, MD 10/04/2020 11:33 AM [facility]

## 2020-10-01 NOTE — Telephone Encounter (Signed)
Spoke with pt and advised per Dr Caryl Comes study was normal and shows no significant lung or heart issue to cause breathing problems.  Breathing issues could be related to conditioning and weight.  Pt advised gradual exercise.  Pt verbalizes understanding and thanked Therapist, sports for the phone call.

## 2020-10-01 NOTE — Patient Instructions (Signed)
Please continue all other medications as before, and refills have been done if requested - the tramadol and lidocaine cream  Please make an appt with Dr Tamala Julian for the gel shots to the knees  Please have the pharmacy call with any other refills you may need.  Please continue your efforts at being more active, low cholesterol diet, and weight control.  Please keep your appointments with your specialists as you may have planned

## 2020-10-01 NOTE — Telephone Encounter (Signed)
-----   Message from [name] Sprang, MD sent at 09/30/2020  5:27 PM EDT ----- Please Inform Patient that study was pretty normal and showed no significant lung or heart issue to cause her breathing issue  may be related to conditioning and wieght >> gradual exercise   Thanks

## 2020-10-04 ENCOUNTER — Encounter: Payer: Self-pay | Admitting: Internal Medicine

## 2020-10-04 NOTE — Assessment & Plan Note (Signed)
Has bilat knee pain, worse on the right especially and life and activity limiting, for ultram prn, lidocaine cream topical, and f/u Dr Tamala Julian for gel shots,  to f/u any worsening symptoms or concerns

## 2020-10-04 NOTE — Assessment & Plan Note (Signed)
BP Readings from Last 3 Encounters:  10/01/20 118/60  06/08/20 118/70  05/27/20 102/70   Stable, pt to continue medical treatment coreg, entresto, aldactone

## 2020-10-04 NOTE — Assessment & Plan Note (Signed)
Last vitamin D Lab Results  Component Value Date   VD25OH 29.18 (L) 10/29/2019   Has been low, to start oral replacement

## 2020-10-04 NOTE — Assessment & Plan Note (Signed)
Lab Results  Component Value Date   HGBA1C 6.0 10/29/2019   Stable, pt to continue current medical treatment  - diet , wt control

## 2020-10-05 ENCOUNTER — Other Ambulatory Visit: Payer: Self-pay | Admitting: Internal Medicine

## 2020-10-06 ENCOUNTER — Telehealth: Payer: Self-pay | Admitting: Internal Medicine

## 2020-10-06 MED ORDER — AZITHROMYCIN 250 MG PO TABS: ORAL_TABLET | ORAL | 0 refills | Status: DC

## 2020-10-06 NOTE — Telephone Encounter (Signed)
Patient notified that [facility] is at [facility]

## 2020-10-06 NOTE — Telephone Encounter (Signed)
Patient calling, states she forgot to mention at her visit on 03.24.22 she has been having issues with her ear and she thinks she has an ear infection. Patient denies appointment at this time because she was just here.  [id number] - [place], [place] Phone:  [phone]  Fax:  [phone]

## 2020-10-06 NOTE — Telephone Encounter (Signed)
[facility] - done erx

## 2020-10-15 ENCOUNTER — Telehealth: Payer: Self-pay | Admitting: Internal Medicine

## 2020-10-16 NOTE — Telephone Encounter (Signed)
Most patients are being stopped after several years of treatment as is not normally needed long term and there may be higher risk of breast cancer with taking it

## 2020-10-16 NOTE — Telephone Encounter (Signed)
    Patient would like to know why estradiol (ESTRACE) 1 MG tablet not approved for refill  Pharmacy [id number] - [place], [place]

## 2020-10-16 NOTE — Telephone Encounter (Signed)
Left patient voicemail regarding why the medication was not refilled

## 2020-10-23 ENCOUNTER — Telehealth: Payer: Self-pay

## 2020-10-23 NOTE — Telephone Encounter (Signed)
Attempted phone call to pt.  No answer and no voicemail. 

## 2020-10-23 NOTE — Telephone Encounter (Signed)
-----   Message from Deboraha Sprang, MD sent at 10/17/2020  4:22 PM EDT ----- [name] [name] can we have her cut her BB in half per DB and CPX results and see if it helps her dyspnea  Thanks SK

## 2020-10-27 MED ORDER — CARVEDILOL 25 MG PO TABS: 12.5000 mg | ORAL_TABLET | Freq: Two times a day (BID) | ORAL | 1 refills | Status: DC

## 2020-10-27 NOTE — Telephone Encounter (Signed)
Spoke with pt and advised per Dr Caryl Comes he recommends pt begin taking her Carvedilol 25mg  - 1/2 tablet by mouth twice daily to see if this helps with SOB.  Pt verbalizes understanding and agrees with current plan.

## 2020-10-27 NOTE — Telephone Encounter (Signed)
Attempted phone call to pt.  Left voicemail message to contact RN at [phone].  Will also send a MyChart message.

## 2020-11-02 ENCOUNTER — Telehealth: Payer: Self-pay | Admitting: Internal Medicine

## 2020-11-02 MED ORDER — SCOPOLAMINE 1 MG/3DAYS TD PT72: 1.0000 | MEDICATED_PATCH | TRANSDERMAL | 0 refills | Status: AC

## 2020-11-02 NOTE — Telephone Encounter (Signed)
Patient is going on a cruise and [name] [name] talked to the pharmacy and said theres a patch that can help with sea sickness so [name] [name] is wondering if Dr. Jenny Reichmann can prescribe that for her.

## 2020-11-02 NOTE — Telephone Encounter (Signed)
Ok [facility]

## 2020-11-03 ENCOUNTER — Ambulatory Visit (INDEPENDENT_AMBULATORY_CARE_PROVIDER_SITE_OTHER): Payer: Medicare Other

## 2020-11-03 DIAGNOSIS — I428 Other cardiomyopathies: Secondary | ICD-10-CM

## 2020-11-03 LAB — CUP PACEART REMOTE DEVICE CHECK
Battery Remaining Longevity: 105 mo
Battery Voltage: 3.01 V
Brady Statistic RV Percent Paced: 0.01 %
Date Time Interrogation Session: 20220426042307
HighPow Impedance: 36 Ohm
HighPow Impedance: 46 Ohm
Implantable Lead Implant Date: 20081215
Implantable Lead Location: 753860
Implantable Lead Model: 6947
Implantable Pulse Generator Implant Date: 20190418
Lead Channel Impedance Value: 342 Ohm
Lead Channel Impedance Value: 437 Ohm
Lead Channel Pacing Threshold Amplitude: 0.75 V
Lead Channel Pacing Threshold Pulse Width: 0.4 ms
Lead Channel Sensing Intrinsic Amplitude: 24.875 mV
Lead Channel Sensing Intrinsic Amplitude: 24.875 mV
Lead Channel Setting Pacing Amplitude: 2.5 V
Lead Channel Setting Pacing Pulse Width: 0.4 ms
Lead Channel Setting Sensing Sensitivity: 0.3 mV

## 2020-11-05 ENCOUNTER — Telehealth: Payer: Self-pay | Admitting: Internal Medicine

## 2020-11-05 NOTE — Telephone Encounter (Signed)
LVM for pt to rtn my call to schedule AWV with [facility]. Please schedule AWV if pt calls the office  

## 2020-11-09 ENCOUNTER — Other Ambulatory Visit: Payer: Self-pay | Admitting: Cardiology

## 2020-11-09 DIAGNOSIS — I428 Other cardiomyopathies: Secondary | ICD-10-CM

## 2020-11-23 ENCOUNTER — Other Ambulatory Visit: Payer: Self-pay | Admitting: Internal Medicine

## 2020-11-24 NOTE — Progress Notes (Signed)
Remote ICD transmission.   

## 2020-11-25 ENCOUNTER — Ambulatory Visit (INDEPENDENT_AMBULATORY_CARE_PROVIDER_SITE_OTHER): Payer: Medicare Other

## 2020-11-25 ENCOUNTER — Ambulatory Visit (INDEPENDENT_AMBULATORY_CARE_PROVIDER_SITE_OTHER): Payer: Medicare Other | Admitting: Internal Medicine

## 2020-11-25 ENCOUNTER — Other Ambulatory Visit: Payer: Self-pay

## 2020-11-25 VITALS — BP 118/60 | HR 62 | Temp 97.6°F | Resp 16 | Ht 67.0 in | Wt 221.6 lb

## 2020-11-25 DIAGNOSIS — R739 Hyperglycemia, unspecified: Secondary | ICD-10-CM | POA: Diagnosis not present

## 2020-11-25 DIAGNOSIS — E559 Vitamin D deficiency, unspecified: Secondary | ICD-10-CM | POA: Diagnosis not present

## 2020-11-25 DIAGNOSIS — Z1159 Encounter for screening for other viral diseases: Secondary | ICD-10-CM

## 2020-11-25 DIAGNOSIS — J309 Allergic rhinitis, unspecified: Secondary | ICD-10-CM

## 2020-11-25 DIAGNOSIS — E7800 Pure hypercholesterolemia, unspecified: Secondary | ICD-10-CM

## 2020-11-25 DIAGNOSIS — I10 Essential (primary) hypertension: Secondary | ICD-10-CM

## 2020-11-25 DIAGNOSIS — Z0000 Encounter for general adult medical examination without abnormal findings: Secondary | ICD-10-CM | POA: Diagnosis not present

## 2020-11-25 DIAGNOSIS — N1831 Chronic kidney disease, stage 3a: Secondary | ICD-10-CM | POA: Diagnosis not present

## 2020-11-25 DIAGNOSIS — Z0001 Encounter for general adult medical examination with abnormal findings: Secondary | ICD-10-CM

## 2020-11-25 MED ORDER — MONTELUKAST SODIUM 10 MG PO TABS: 10.0000 mg | ORAL_TABLET | Freq: Every day | ORAL | 11 refills | Status: DC

## 2020-11-25 MED ORDER — AZITHROMYCIN 250 MG PO TABS: ORAL_TABLET | ORAL | 1 refills | Status: DC

## 2020-11-25 NOTE — Patient Instructions (Signed)
[name] [name] , Thank you for taking time to come for your Medicare Wellness Visit. I appreciate your ongoing commitment to your health goals. Please review the following plan we discussed and let me know if I can assist you in the future.   Screening recommendations/referrals: Colonoscopy: not a candidate for colon cancer screening due to age 80: 09/22/2020 Bone Density: never done Recommended yearly ophthalmology/optometry visit for glaucoma screening and checkup Recommended yearly dental visit for hygiene and checkup  Vaccinations: Influenza vaccine: declined Pneumococcal vaccine: 02/07/2013, 05/01/2014 (completed) Tdap vaccine: 05/22/2018; due every 10 years Shingles vaccine: Please call your insurance company to determine your out of pocket expense for the Shingrix vaccine. You may receive this vaccine at your local pharmacy.  Covid-19: 08/28/2019, 09/18/2019, 04/24/2020  Advanced directives: Please bring a copy of your health care power of attorney and living will to the office at your convenience.  Conditions/risks identified: Yes; Reviewed health maintenance screenings with patient today and relevant education, vaccines, and/or referrals were provided. Please continue to do your personal lifestyle choices by: daily care of teeth and gums, regular physical activity (goal should be 5 days a week for 30 minutes), eat a healthy diet, avoid tobacco and drug use, limiting any alcohol intake, taking a low-dose aspirin (if not allergic or have been advised by your provider otherwise) and taking vitamins and minerals as recommended by your provider. Continue doing brain stimulating activities (puzzles, reading, adult coloring books, staying active) to keep memory sharp. Continue to eat heart healthy diet (full of fruits, vegetables, whole grains, lean protein, water--limit salt, fat, and sugar intake) and increase physical activity as tolerated.  Next appointment: Please schedule your next  Medicare Wellness Visit with your Nurse Health Advisor in 1 year by calling [phone].  Preventive Care 52 Years and Older, Female Preventive care refers to lifestyle choices and visits with your health care provider that can promote health and wellness. What does preventive care include?  A yearly physical exam. This is also called an annual well check.  Dental exams once or twice a year.  Routine eye exams. Ask your health care provider how often you should have your eyes checked.  Personal lifestyle choices, including:  Daily care of your teeth and gums.  Regular physical activity.  Eating a healthy diet.  Avoiding tobacco and drug use.  Limiting alcohol use.  Practicing safe sex.  Taking low-dose aspirin every day.  Taking vitamin and mineral supplements as recommended by your health care provider. What happens during an annual well check? The services and screenings done by your health care provider during your annual well check will depend on your age, overall health, lifestyle risk factors, and family history of disease. Counseling  Your health care provider may ask you questions about your:  Alcohol use.  Tobacco use.  Drug use.  Emotional well-being.  Home and relationship well-being.  Sexual activity.  Eating habits.  History of falls.  Memory and ability to understand (cognition).  Work and work Statistician.  Reproductive health. Screening  You may have the following tests or measurements:  Height, weight, and BMI.  Blood pressure.  Lipid and cholesterol levels. These may be checked every 5 years, or more frequently if you are over 10 years old.  Skin check.  Lung cancer screening. You may have this screening every year starting at age 11 if you have a 30-pack-year history of smoking and currently smoke or have quit within the past 15 years.  Fecal occult blood test (  FOBT) of the stool. You may have this test every year starting at age  63.  Flexible sigmoidoscopy or colonoscopy. You may have a sigmoidoscopy every 5 years or a colonoscopy every 10 years starting at age 13.  Hepatitis C blood test.  Hepatitis B blood test.  Sexually transmitted disease (STD) testing.  Diabetes screening. This is done by checking your blood sugar (glucose) after you have not eaten for a while (fasting). You may have this done every 1-3 years.  Bone density scan. This is done to screen for osteoporosis. You may have this done starting at age 70.  Mammogram. This may be done every 1-2 years. Talk to your health care provider about how often you should have regular mammograms. Talk with your health care provider about your test results, treatment options, and if necessary, the need for more tests. Vaccines  Your health care provider may recommend certain vaccines, such as:  Influenza vaccine. This is recommended every year.  Tetanus, diphtheria, and acellular pertussis (Tdap, Td) vaccine. You may need a Td booster every 10 years.  Zoster vaccine. You may need this after age 32.  Pneumococcal 13-valent conjugate (PCV13) vaccine. One dose is recommended after age 8.  Pneumococcal polysaccharide (PPSV23) vaccine. One dose is recommended after age 62. Talk to your health care provider about which screenings and vaccines you need and how often you need them. This information is not intended to replace advice given to you by your health care provider. Make sure you discuss any questions you have with your health care provider. Document Released: 07/24/2015 Document Revised: 03/16/2016 Document Reviewed: 04/28/2015 Elsevier Interactive Patient Education  2017 [place] Prevention in the Home Falls can cause injuries. They can happen to people of all ages. There are many things you can do to make your home safe and to help prevent falls. What can I do on the outside of my home?  Regularly fix the edges of walkways and driveways  and fix any cracks.  Remove anything that might make you trip as you walk through a door, such as a raised step or threshold.  Trim any bushes or trees on the path to your home.  Use bright outdoor lighting.  Clear any walking paths of anything that might make someone trip, such as rocks or tools.  Regularly check to see if handrails are loose or broken. Make sure that both sides of any steps have handrails.  Any raised decks and porches should have guardrails on the edges.  Have any leaves, snow, or ice cleared regularly.  Use sand or salt on walking paths during winter.  Clean up any spills in your garage right away. This includes oil or grease spills. What can I do in the bathroom?  Use night lights.  Install grab bars by the toilet and in the tub and shower. Do not use towel bars as grab bars.  Use non-skid mats or decals in the tub or shower.  If you need to sit down in the shower, use a plastic, non-slip stool.  Keep the floor dry. Clean up any water that spills on the floor as soon as it happens.  Remove soap buildup in the tub or shower regularly.  Attach bath mats securely with double-sided non-slip rug tape.  Do not have throw rugs and other things on the floor that can make you trip. What can I do in the bedroom?  Use night lights.  Make sure that you have a light  by your bed that is easy to reach.  Do not use any sheets or blankets that are too big for your bed. They should not hang down onto the floor.  Have a firm chair that has side arms. You can use this for support while you get dressed.  Do not have throw rugs and other things on the floor that can make you trip. What can I do in the kitchen?  Clean up any spills right away.  Avoid walking on wet floors.  Keep items that you use a lot in easy-to-reach places.  If you need to reach something above you, use a strong step stool that has a grab bar.  Keep electrical cords out of the way.  Do  not use floor polish or wax that makes floors slippery. If you must use wax, use non-skid floor wax.  Do not have throw rugs and other things on the floor that can make you trip. What can I do with my stairs?  Do not leave any items on the stairs.  Make sure that there are handrails on both sides of the stairs and use them. Fix handrails that are broken or loose. Make sure that handrails are as long as the stairways.  Check any carpeting to make sure that it is firmly attached to the stairs. Fix any carpet that is loose or worn.  Avoid having throw rugs at the top or bottom of the stairs. If you do have throw rugs, attach them to the floor with carpet tape.  Make sure that you have a light switch at the top of the stairs and the bottom of the stairs. If you do not have them, ask someone to add them for you. What else can I do to help prevent falls?  Wear shoes that:  Do not have high heels.  Have rubber bottoms.  Are comfortable and fit you well.  Are closed at the toe. Do not wear sandals.  If you use a stepladder:  Make sure that it is fully opened. Do not climb a closed stepladder.  Make sure that both sides of the stepladder are locked into place.  Ask someone to hold it for you, if possible.  Clearly mark and make sure that you can see:  Any grab bars or handrails.  First and last steps.  Where the edge of each step is.  Use tools that help you move around (mobility aids) if they are needed. These include:  Canes.  Walkers.  Scooters.  Crutches.  Turn on the lights when you go into a dark area. Replace any light bulbs as soon as they burn out.  Set up your furniture so you have a clear path. Avoid moving your furniture around.  If any of your floors are uneven, fix them.  If there are any pets around you, be aware of where they are.  Review your medicines with your doctor. Some medicines can make you feel dizzy. This can increase your chance of  falling. Ask your doctor what other things that you can do to help prevent falls. This information is not intended to replace advice given to you by your health care provider. Make sure you discuss any questions you have with your health care provider. Document Released: 04/23/2009 Document Revised: 12/03/2015 Document Reviewed: 08/01/2014 Elsevier Interactive Patient Education  2017 [employer].

## 2020-11-25 NOTE — Patient Instructions (Signed)
Please take all new medication as prescribed - the singulair and antibiotic sent to [facility]  Please continue all other medications as before, and refills have been done if requested.  Please have the pharmacy call with any other refills you may need.  Please continue your efforts at being more active, low cholesterol diet, and weight control.  You are otherwise up to date with prevention measures today.  You will be contacted regarding the referral for: Lipid clinic  Please keep your appointments with your specialists as you may have planned- kidney doctor  Please go to the LAB at the blood drawing area for the tests to be done  You will be contacted by phone if any changes need to be made immediately.  Otherwise, you will receive a letter about your results with an explanation, but please check with MyChart first.  Please remember to sign up for MyChart if you have not done so, as this will be important to you in the future with finding out test results, communicating by private email, and scheduling acute appointments online when needed.  Please make an Appointment to return in 6 months, or sooner if needed

## 2020-11-25 NOTE — Progress Notes (Signed)
Subjective:   [name] [name] is a 81 y.o. female who presents for Medicare Annual (Subsequent) preventive examination.  Review of Systems    No ROS. Medicare Wellness Visit. Additional risk factors are reflected in social history. Cardiac Risk Factors include: advanced age (>67men, >70 women);dyslipidemia;family history of premature cardiovascular disease;hypertension;obesity (BMI >30kg/m2)     Objective:    Today's Vitals   11/25/20 0937  BP: 118/60  Pulse: 62  Resp: 16  Temp: 97.6 F (36.4 C)  SpO2: 96%  Weight: 221 lb 9.6 oz (100.5 kg)  Height: 5\' 7"  (1.702 m)  PainSc: 0-No pain   Body mass index is 34.71 kg/m.  Advanced Directives 11/25/2020 07/22/2019 05/03/2019 03/15/2019 10/23/2018 10/11/2018 10/09/2018  Does Patient Have a Medical Advance Directive? Yes Yes No No No No No  Type of Advance Directive Living will - - - - - -  Does patient want to make changes to medical advance directive? No - Patient declined - - - - - -  Copy of Press photographer in Chart? - - - - - - -  Would patient like information on creating a medical advance directive? - - No - Patient declined - No - Patient declined No - Patient declined No - Patient declined    Current Medications (verified) Outpatient Encounter Medications as of 11/25/2020  Medication Sig  . albuterol (PROVENTIL HFA;VENTOLIN HFA) 108 (90 Base) MCG/ACT inhaler Inhale 1 puff into the lungs every 6 (six) hours as needed for wheezing or shortness of breath.  . carvedilol (COREG) 25 MG tablet Take 0.5 tablets (12.5 mg total) by mouth 2 (two) times daily.  . cholecalciferol (VITAMIN D3) 25 MCG (1000 UT) tablet Take 1,000 Units by mouth daily.  [name] ENTRESTO 24-26 MG TAKE 1 TABLET BY MOUTH 2 (TWO) TIMES DAILY  . estradiol (ESTRACE) 1 MG tablet Take 1 mg by mouth daily.  . furosemide (LASIX) 40 MG tablet Take 1 tablet (40 mg total) by mouth 2 (two) times daily.  [name] ibuprofen (ADVIL) 800 MG tablet TAKE 1 TABLET (800 MG TOTAL)  EVERY 8 (EIGHT) HOURS AS NEEDED BY MOUTH.  . lidocaine-prilocaine (EMLA) cream APPLY TO AFFECTED AREA ONCE AS DIRECTED  . meloxicam (MOBIC) 15 MG tablet TAKE 1 TABLET BY MOUTH EVERY DAY AS NEEDED FOR PAIN  . Multiple Vitamin (MULTIVITAMIN WITH MINERALS) TABS tablet Take 1 tablet by mouth daily.  . rizatriptan (MAXALT) 10 MG tablet Take 10 mg by mouth as needed for migraine. May repeat in 2 hours if needed  . scopolamine (TRANSDERM-SCOP, 1.5 MG,) 1 MG/3DAYS Place 1 patch (1.5 mg total) onto the skin every 3 (three) days.  [name] spironolactone (ALDACTONE) 25 MG tablet Take 0.5 tablets (12.5 mg total) by mouth daily.  [name] azithromycin (ZITHROMAX) 250 MG tablet 2 tab by mouth day 1, then 1 per day (Patient not taking: Reported on 11/25/2020)  . traMADol (ULTRAM) 50 MG tablet Take 1 tablet (50 mg total) by mouth every 6 (six) hours as needed. (Patient not taking: Reported on 11/25/2020)  . [DISCONTINUED] prochlorperazine (COMPAZINE) 10 MG tablet TAKE 1 TABLET (10 MG TOTAL) BY MOUTH EVERY 6 (SIX) HOURS AS NEEDED (NAUSEA OR VOMITING).   No facility-administered encounter medications on file as of 11/25/2020.    Allergies (verified) Topamax [topiramate] and Zocor [simvastatin - high dose]   History: Past Medical History:  Diagnosis Date  . AICD (automatic cardioverter/defibrillator) present   . ALLERGIC RHINITIS 11/06/2007  . 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 ANEMIA-NOS  06/17/2008  . ANXIETY 02/22/2008  . CARDIOMYOPATHY, PRIMARY, DILATED 12/30/2008  . [place] DISEASE, 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  . GERD 05/07/2007  . History of kidney stones   . HYPERLIPIDEMIA 05/23/2007  . HYPERTENSION 05/07/2007  . 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 MENOPAUSAL DISORDER 05/06/2008  . 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 OVERACTIVE BLADDER 08/03/2010  . PYELONEPHRITIS, HX OF 05/07/2007  . SYSTOLIC HEART FAILURE, CHRONIC 12/28/2008  . VITAMIN D DEFICIENCY 06/17/2008   Past Surgical History:  Procedure  Laterality Date  . ABDOMINAL HYSTERECTOMY    . BREAST LUMPECTOMY WITH RADIOACTIVE SEED AND SENTINEL LYMPH NODE BIOPSY Right 10/11/2018   Procedure: RIGHT BREAST LUMPECTOMY WITH RADIOACTIVE SEED AND SENTINEL LYMPH NODE BIOPSY;  Surgeon: Stark Klein, MD;  Location: [place];  Service: General;  Laterality: Right;  . ICD GENERATOR CHANGEOUT N/A 10/25/2017   Procedure: ICD GENERATOR CHANGEOUT;  Surgeon: Deboraha Sprang, MD;  Location: [place] [facility];  Service: Cardiovascular;  Laterality: N/A;  . LAMINECTOMY    . PORT-A-CATH REMOVAL N/A 05/08/2019   Procedure: REMOVAL OF PORT-A-CATH RIGHT CHEST;  Surgeon: Stark Klein, MD;  Location: [place];  Service: General;  Laterality: N/A;  . PORTACATH PLACEMENT N/A 10/11/2018   Procedure: INSERTION PORT-A-CATH;  Surgeon: Stark Klein, MD;  Location: [place];  Service: General;  Laterality: N/A;  . RE-EXCISION OF BREAST LUMPECTOMY Right 10/23/2018   Procedure: RE-EXCISION OF RIGHT BREAST LUMPECTOMY;  Surgeon: Stark Klein, MD;  Location: [place];  Service: General;  Laterality: Right;   Family History  Problem Relation Age of Onset  . Arthritis Other   . Coronary artery disease Other   . Kidney disease Other   . Heart disease Mother    Social History   Socioeconomic History  . Marital status: Widowed    Spouse name: Not on file  . Number of children: 3  . Years of education: Not on file  . Highest education level: Not on file  Occupational History  . Occupation: retired Ambulance person: RETIRED  Tobacco Use  . Smoking status: Never Smoker  . Smokeless tobacco: Never Used  Substance and Sexual Activity  . Alcohol use: No  . Drug use: No  . Sexual activity: Never  Other Topics Concern  . Not on file  Social History Narrative  . Not on file   Social Determinants of Health   Financial Resource Strain: Low Risk   . Difficulty of Paying Living Expenses: Not hard at all  Food Insecurity: No Food Insecurity  . Worried About Paediatric nurse in the Last Year: Never true  . Ran Out of Food in the Last Year: Never true  Transportation Needs: No Transportation Needs  . Lack of Transportation (Medical): No  . Lack of Transportation (Non-Medical): No  Physical Activity: Sufficiently Active  . Days of Exercise per Week: 5 days  . Minutes of Exercise per Session: 30 min  Stress: No Stress Concern Present  . Feeling of Stress : Not at all  Social Connections: Moderately Integrated  . Frequency of Communication with Friends and Family: More than three times a week  . Frequency of Social Gatherings with Friends and Family: Once a week  . Attends Religious Services: More than 4 times per year  . Active Member of Clubs or Organizations: No  . Attends Archivist Meetings: More than 4 times per year  . Marital Status: Never married    Tobacco Counseling Counseling given:  Not Answered   Clinical Intake:  Pre-visit preparation completed: Yes  Pain : No/denies pain Pain Score: 0-No pain     BMI - recorded: 34.71 Nutritional Status: BMI 25 -29 Overweight Nutritional Risks: None Diabetes: No  How often do you need to have someone help you when you read instructions, pamphlets, or other written materials from your doctor or pharmacy?: 1 - Never What is the last grade level you completed in school?: 2 years of college  Diabetic? no  Interpreter Needed?: No  Information entered by :: Lisette Abu, LPN   Activities of Daily Living In your present state of health, do you have any difficulty performing the following activities: 11/25/2020 10/01/2020  Hearing? N N  Vision? N N  Difficulty concentrating or making decisions? N N  Walking or climbing stairs? Y Y  Dressing or bathing? N N  Doing errands, shopping? N N  Preparing Food and eating ? N -  Using the Toilet? N -  In the past six months, have you accidently leaked urine? Y -  Do you have problems with loss of bowel control? N -  Managing  your Medications? N -  Managing your Finances? N -  Housekeeping or managing your Housekeeping? N -  Some recent data might be hidden    Patient Care Team: Biagio Borg, MD as PCP - General Stanford Breed Denice Bors, MD as Consulting Physician (Cardiology) Deboraha Sprang, MD as Consulting Physician (Cardiology) Nicholas Lose, MD as Consulting Physician (Hematology and Oncology) Stark Klein, MD as Consulting Physician (General Surgery) Eppie Gibson, MD as Attending Physician (Radiation Oncology)  Indicate any recent Medical Services you may have received from other than [facility] providers in the past year (date may be approximate).     Assessment:   This is a routine wellness examination for [facility].  Hearing/Vision screen No exam data present  Dietary issues and exercise activities discussed: Current Exercise Habits: Home exercise routine, Type of exercise: treadmill;walking, Time (Minutes): 30, Frequency (Times/Week): 5, Weekly Exercise (Minutes/Week): 150, Intensity: Mild, Exercise limited by: orthopedic condition(s)  Goals Addressed            This Visit's Progress   . Patient Stated       I would like to lose 15-20 pounds to help with my knee pain.      Depression Screen PHQ 2/9 Scores 11/25/2020 10/29/2019 10/29/2019 05/31/2019 05/22/2018 05/16/2017 09/07/2016  PHQ - 2 Score 0 0 0 0 0 0 1  PHQ- 9 Score - - - - 0 - -    Fall Risk Fall Risk  11/25/2020 10/29/2019 10/29/2019 05/31/2019 05/22/2018  Falls in the past year? 0 0 0 1 0  Number falls in past yr: 0 - 0 0 -  Injury with Fall? 0 - 0 0 -  Risk for fall due to : No Fall Risks - No Fall Risks - -  Follow up Falls evaluation completed - Falls evaluation completed - -    FALL RISK PREVENTION PERTAINING TO THE HOME:  Any stairs in or around the home? Yes  If so, are there any without handrails? No  Home free of loose throw rugs in walkways, pet beds, electrical cords, etc? Yes  Adequate lighting in your home to reduce risk  of falls? Yes   ASSISTIVE DEVICES UTILIZED TO PREVENT FALLS:  Life alert? No  Use of a cane, walker or w/c? Yes  Grab bars in the bathroom? Yes  Shower chair or bench in shower? Yes  Elevated toilet seat or a handicapped toilet? Yes   TIMED UP AND GO:  Was the test performed? No .  Length of time to ambulate 10 feet: 0 sec.   Gait steady and fast without use of assistive device  Cognitive Function: Normal cognitive status assessed by direct observation by this Nurse Health Advisor. No abnormalities found.   MMSE - Mini Mental State Exam 05/08/2015  Not completed: (No Data)        Immunizations Immunization History  Administered Date(s) Administered  . Fluad Quad(high Dose 65+) 05/31/2019  . PFIZER(Purple Top)SARS-COV-2 Vaccination 08/28/2019, 09/18/2019, 04/24/2020  . Pneumococcal Conjugate-13 02/07/2013  . Pneumococcal Polysaccharide-23 05/01/2014  . Tdap 05/22/2018    TDAP status: Up to date  Flu Vaccine status: Due, Education has been provided regarding the importance of this vaccine. Advised may receive this vaccine at local pharmacy or Health Dept. Aware to provide a copy of the vaccination record if obtained from local pharmacy or Health Dept. Verbalized acceptance and understanding.  Pneumococcal vaccine status: Up to date  Covid-19 vaccine status: Completed vaccines  Qualifies for Shingles Vaccine? Yes   Zostavax completed No   Shingrix Completed?: No.    Education has been provided regarding the importance of this vaccine. Patient has been advised to call insurance company to determine out of pocket expense if they have not yet received this vaccine. Advised may also receive vaccine at local pharmacy or Health Dept. Verbalized acceptance and understanding.  Screening Tests Health Maintenance  Topic Date Due  . DEXA SCAN  Never done  . COVID-19 Vaccine (4 - Booster for [place] series) 07/25/2020  . Hepatitis C Screening  05/27/2021 (Originally 11/21/1958)  .  TETANUS/TDAP  05/22/2028  . PNA vac Low Risk Adult  Completed  . HPV VACCINES  Aged Out  . INFLUENZA VACCINE  Discontinued    Health Maintenance  Health Maintenance Due  Topic Date Due  . DEXA SCAN  Never done  . COVID-19 Vaccine (4 - Booster for Pfizer series) 07/25/2020    Colorectal cancer screening: No longer required.   (Patient will speak with PCP regarding Cologuard Testing)  Mammogram status: Completed 09/22/2020. Repeat every year  Bone Density Status: never done  Lung Cancer Screening: (Low Dose CT Chest recommended if Age 73-80 years, 30 pack-year currently smoking OR have quit w/in 15years.) does not qualify.   Lung Cancer Screening Referral: no  Additional Screening:  Hepatitis C Screening: does qualify; Completed no  Vision Screening: Recommended annual ophthalmology exams for early detection of glaucoma and other disorders of the eye. Is the patient up to date with their annual eye exam?  Yes  Who is the provider or what is the name of the office in which the patient attends annual eye exams? Carmel [place], OD. If pt is not established with a provider, would they like to be referred to a provider to establish care? No .   Dental Screening: Recommended annual dental exams for proper oral hygiene  Community Resource Referral / Chronic Care Management: CRR required this visit?  No   CCM required this visit?  No      Plan:     I have personally reviewed and noted the following in the patient's chart:   . Medical and social history . Use of alcohol, tobacco or illicit drugs  . Current medications and supplements including opioid prescriptions.  . Functional ability and status . Nutritional status . Physical activity . Advanced directives . List of other physicians .  Hospitalizations, surgeries, and ER visits in previous 12 months . Vitals . Screenings to include cognitive, depression, and falls . Referrals and appointments  In addition, I have  reviewed and discussed with patient certain preventive protocols, quality metrics, and best practice recommendations. A written personalized care plan for preventive services as well as general preventive health recommendations were provided to patient.     Sheral Flow, LPN   03/25/568   Nurse Notes: n/a

## 2020-11-25 NOTE — Progress Notes (Signed)
Patient ID: [name] [name], female   DOB: 08-21-1940, 80 y.o.   MRN: [id number]         Chief Complaint:: wellness exam and Follow-up (6 month f/u)  low vit d, hld, ckd, allergies, left ear fullness, hyperglycemia, htn       HPI:  [name] [name] is a 80 y.o. female here for wellness exam; has eye exam for July 2022; will do cologuard - has kit at home; due for hep c screen, declines dxa and covid booster; o/w up to date with preventive referrals and immunizations.  Not sure is wants shingrix.                        Also overall ow doing ok.  Pt denies chest pain, increased sob or doe, wheezing, orthopnea, PND, increased LE swelling, palpitations, dizziness or syncope.   Pt denies polydipsia, polyuria, or new focal neuro s/s.  Does have several wks ongoing nasal allergy symptoms with clearish congestion, itch and sneezing, without fever, pain, ST, cough, swelling or wheezing but does have left ear fullness.  Has  Regular f/u with renal with CKD.  Not taking Vit d.  Goal LDL chol < 70, but statin intolerant.   Pt denies fever, wt loss, night sweats, loss of appetite, or other constitutional symptoms  No other new complaints   Wt Readings from Last 3 Encounters:  11/25/20 221 lb 9.6 oz (100.5 kg)  10/01/20 229 lb (103.9 kg)  06/08/20 222 lb (100.7 kg)   BP Readings from Last 3 Encounters:  11/25/20 118/60  10/01/20 118/60  06/08/20 118/70   Immunization History  Administered Date(s) Administered  . Fluad Quad(high Dose 65+) 05/31/2019  . PFIZER(Purple Top)SARS-COV-2 Vaccination 08/28/2019, 09/18/2019, 04/24/2020  . Pneumococcal Conjugate-13 02/07/2013  . Pneumococcal Polysaccharide-23 05/01/2014  . Tdap 05/22/2018   There are no preventive care reminders to display for this patient.    Past Medical History:  Diagnosis Date  . AICD (automatic cardioverter/defibrillator) present   . ALLERGIC RHINITIS 11/06/2007  . 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 ANEMIA-NOS 06/17/2008  . ANXIETY  02/22/2008  . CARDIOMYOPATHY, PRIMARY, DILATED 12/30/2008  . [place] DISEASE, 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  . GERD 05/07/2007  . History of kidney stones   . HYPERLIPIDEMIA 05/23/2007  . HYPERTENSION 05/07/2007  . 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 MENOPAUSAL DISORDER 05/06/2008  . 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 OVERACTIVE BLADDER 08/03/2010  . PYELONEPHRITIS, HX OF 05/07/2007  . SYSTOLIC HEART FAILURE, CHRONIC 12/28/2008  . VITAMIN D DEFICIENCY 06/17/2008   Past Surgical History:  Procedure Laterality Date  . ABDOMINAL HYSTERECTOMY    . BREAST LUMPECTOMY WITH RADIOACTIVE SEED AND SENTINEL LYMPH NODE BIOPSY Right 10/11/2018   Procedure: RIGHT BREAST LUMPECTOMY WITH RADIOACTIVE SEED AND SENTINEL LYMPH NODE BIOPSY;  Surgeon: Stark Klein, MD;  Location: [place];  Service: General;  Laterality: Right;  . ICD GENERATOR CHANGEOUT N/A 10/25/2017   Procedure: ICD GENERATOR CHANGEOUT;  Surgeon: Deboraha Sprang, MD;  Location: [place] [facility];  Service: Cardiovascular;  Laterality: N/A;  . LAMINECTOMY    . PORT-A-CATH REMOVAL N/A 05/08/2019   Procedure: REMOVAL OF PORT-A-CATH RIGHT CHEST;  Surgeon: Stark Klein, MD;  Location: [place];  Service: General;  Laterality: N/A;  . PORTACATH PLACEMENT N/A 10/11/2018   Procedure: INSERTION PORT-A-CATH;  Surgeon: Stark Klein, MD;  Location: [place];  Service: General;  Laterality: N/A;  . RE-EXCISION OF BREAST LUMPECTOMY Right 10/23/2018   Procedure:  RE-EXCISION OF RIGHT BREAST LUMPECTOMY;  Surgeon: Stark Klein, MD;  Location: [place];  Service: General;  Laterality: Right;    reports that she has never smoked. She has never used smokeless tobacco. She reports that she does not drink alcohol and does not use drugs. family history includes Arthritis in an other family member; Coronary artery disease in an other family member; Heart disease in her mother; Kidney disease in an other family member. Allergies  Allergen Reactions  . Tramadol Nausea  And Vomiting  . Topamax [Topiramate] Other (See Comments)    Confusion, gets lost going home  . Zocor [Simvastatin - High Dose] Nausea Only    Nausea    Current Outpatient Medications on File Prior to Visit  Medication Sig Dispense Refill  . albuterol (PROVENTIL HFA;VENTOLIN HFA) 108 (90 Base) MCG/ACT inhaler Inhale 1 puff into the lungs every 6 (six) hours as needed for wheezing or shortness of breath.    . carvedilol (COREG) 25 MG tablet Take 0.5 tablets (12.5 mg total) by mouth 2 (two) times daily. 90 tablet 1  . cholecalciferol (VITAMIN D3) 25 MCG (1000 UT) tablet Take 1,000 Units by mouth daily.    [name] ENTRESTO 24-26 MG TAKE 1 TABLET BY MOUTH 2 (TWO) TIMES DAILY 60 tablet 5  . estradiol (ESTRACE) 1 MG tablet Take 1 mg by mouth daily.    . furosemide (LASIX) 40 MG tablet Take 1 tablet (40 mg total) by mouth 2 (two) times daily. 180 tablet 3  . ibuprofen (ADVIL) 800 MG tablet TAKE 1 TABLET (800 MG TOTAL) EVERY 8 (EIGHT) HOURS AS NEEDED BY MOUTH. 40 tablet 2  . lidocaine-prilocaine (EMLA) cream APPLY TO AFFECTED AREA ONCE AS DIRECTED 30 g 3  . meloxicam (MOBIC) 15 MG tablet TAKE 1 TABLET BY MOUTH EVERY DAY AS NEEDED FOR PAIN 90 tablet 3  . Multiple Vitamin (MULTIVITAMIN WITH MINERALS) TABS tablet Take 1 tablet by mouth daily.    . rizatriptan (MAXALT) 10 MG tablet Take 10 mg by mouth as needed for migraine. May repeat in 2 hours if needed    . scopolamine (TRANSDERM-SCOP, 1.5 MG,) 1 MG/3DAYS Place 1 patch (1.5 mg total) onto the skin every 3 (three) days. 10 patch 0  . spironolactone (ALDACTONE) 25 MG tablet Take 0.5 tablets (12.5 mg total) by mouth daily. 45 tablet 3  . traMADol (ULTRAM) 50 MG tablet Take 1 tablet (50 mg total) by mouth every 6 (six) hours as needed. (Patient not taking: Reported on 11/25/2020) 120 tablet 2  . [DISCONTINUED] prochlorperazine (COMPAZINE) 10 MG tablet TAKE 1 TABLET (10 MG TOTAL) BY MOUTH EVERY 6 (SIX) HOURS AS NEEDED (NAUSEA OR VOMITING). 30 tablet 1   No  current facility-administered medications on file prior to visit.        ROS:  All others reviewed and negative.  Objective        PE:  There were no vitals taken for this visit.                Constitutional: Pt appears in NAD               HENT: Head: NCAT.                Right Ear: External ear normal.                 Left Ear: External ear normal. ; left TM with severe erythema  Eyes: . Pupils are equal, round, and reactive to light. Conjunctivae and EOM are normal               Nose: without d/c or deformity               Neck: Neck supple. Gross normal ROM               Cardiovascular: Normal rate and regular rhythm.                 Pulmonary/Chest: Effort normal and breath sounds without rales or wheezing.                Abd:  Soft, NT, ND, + BS, no organomegaly               Neurological: Pt is alert. At baseline orientation, motor grossly intact               Skin: Skin is warm. No rashes, no other new lesions, LE edema - none               Psychiatric: Pt behavior is normal without agitation   Micro: none  Cardiac tracings I have personally interpreted today:  none  Pertinent Radiological findings (summarize): none   Lab Results  Component Value Date   WBC 4.7 10/29/2019   HGB 11.2 (L) 10/29/2019   HCT 35.2 (L) 10/29/2019   PLT 199.0 10/29/2019   GLUCOSE 100 (H) 04/03/2020   CHOL 204 (H) 10/29/2019   TRIG 133.0 10/29/2019   HDL 51.20 10/29/2019   LDLDIRECT 129.7 02/07/2013   LDLCALC 126 (H) 10/29/2019   ALT 9 10/29/2019   AST 13 10/29/2019   NA 141 04/03/2020   K 4.6 04/03/2020   CL 102 04/03/2020   CREATININE 1.49 (H) 04/03/2020   BUN 19 04/03/2020   CO2 27 04/03/2020   TSH 1.05 10/29/2019   INR 0.9 RATIO 06/18/2007   HGBA1C 6.0 10/29/2019   MICROALBUR 1.4 10/29/2019   Assessment/Plan:  [name] [name] is a 80 y.o. 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  Encounter for well adult exam with abnormal findings Age and sex appropriate education and counseling updated with regular exercise and diet Referrals for preventative services - has eye f/u June 2022, due for hep c screen, and cologuard Immunizations addressed - decliens dxa and covid booster, may consider shignrix Smoking counseling  - none needed Evidence for depression or other mood disorder - none significant Most recent labs reviewed. I have personally reviewed and have noted: 1) the patient's medical and social history 2) The patient's current medications and supplements 3) The patient's height, weight, and BMI have been recorded in the chart   Hyperlipidemia Lab Results  Component Value Date   LDLCALC 126 (H) 10/29/2019   Stable, pt to continue current low chol diet, declines statin, but refer lipid clinic for possible repatha or similar   Hyperglycemia Lab Results  Component Value Date   HGBA1C 6.0 10/29/2019   Stable, pt to continue current medical treatment  - diet   Essential  hypertension BP Readings from Last 3 Encounters:  11/25/20 118/60  10/01/20 118/60  06/08/20 118/70   Stable, pt to continue medical treatment coreg entresto   CKD (chronic kidney disease) stage 3, GFR 30-59 ml/min Lab Results  Component Value Date   CREATININE 1.49 (H) 04/03/2020   Stable overall, cont to avoid  nephrotoxins, f/u renal as planned  Allergic rhinitis Mild to mod, for antibx course, singulair 10 qd,  to f/u any worsening symptoms or concerns  Vitamin D deficiency Last vitamin D Lab Results  Component Value Date   VD25OH 29.18 (L) 10/29/2019   Low, to start vit d3 oral replacement   Followup: Return in about 6 months (around 05/28/2021).  Cathlean Cower, MD 11/28/2020 10:59 PM [facility]

## 2020-11-26 ENCOUNTER — Telehealth: Payer: Self-pay | Admitting: Internal Medicine

## 2020-11-26 MED ORDER — AZITHROMYCIN 250 MG PO TABS: ORAL_TABLET | ORAL | 1 refills | Status: DC

## 2020-11-26 MED ORDER — MONTELUKAST SODIUM 10 MG PO TABS: 10.0000 mg | ORAL_TABLET | Freq: Every day | ORAL | 11 refills | Status: DC

## 2020-11-26 NOTE — Telephone Encounter (Signed)
   Please resend  montelukast (SINGULAIR) 10 MG tablet azithromycin (ZITHROMAX) 250 MG tablet to  [id number] - [place], [place] - [address]    Patient does not use [facility]

## 2020-11-28 ENCOUNTER — Encounter: Payer: Self-pay | Admitting: Internal Medicine

## 2020-11-28 NOTE — Assessment & Plan Note (Signed)
Lab Results  Component Value Date   LDLCALC 126 (H) 10/29/2019   Stable, pt to continue current low chol diet, declines statin, but refer lipid clinic for possible repatha or similar

## 2020-11-28 NOTE — Assessment & Plan Note (Signed)
BP Readings from Last 3 Encounters:  11/25/20 118/60  10/01/20 118/60  06/08/20 118/70   Stable, pt to continue medical treatment coreg entresto

## 2020-11-28 NOTE — Assessment & Plan Note (Signed)
Mild to mod, for antibx course, singulair 10 qd,  to f/u any worsening symptoms or concerns

## 2020-11-28 NOTE — Assessment & Plan Note (Signed)
Age and sex appropriate education and counseling updated with regular exercise and diet Referrals for preventative services - has eye f/u June 2022, due for hep c screen, and cologuard Immunizations addressed - decliens dxa and covid booster, may consider shignrix Smoking counseling  - none needed Evidence for depression or other mood disorder - none significant Most recent labs reviewed. I have personally reviewed and have noted: 1) the patient's medical and social history 2) The patient's current medications and supplements 3) The patient's height, weight, and BMI have been recorded in the chart

## 2020-11-28 NOTE — Assessment & Plan Note (Signed)
Lab Results  Component Value Date   CREATININE 1.49 (H) 04/03/2020   Stable overall, cont to avoid nephrotoxins, f/u renal as planned

## 2020-11-28 NOTE — Assessment & Plan Note (Signed)
Last vitamin D Lab Results  Component Value Date   VD25OH 29.18 (L) 10/29/2019   Low, to start vit d3 oral replacement

## 2020-11-28 NOTE — Assessment & Plan Note (Signed)
Lab Results  Component Value Date   HGBA1C 6.0 10/29/2019   Stable, pt to continue current medical treatment  - diet

## 2021-02-02 ENCOUNTER — Ambulatory Visit (INDEPENDENT_AMBULATORY_CARE_PROVIDER_SITE_OTHER): Payer: Medicare Other

## 2021-02-02 DIAGNOSIS — I428 Other cardiomyopathies: Secondary | ICD-10-CM

## 2021-02-04 LAB — CUP PACEART REMOTE DEVICE CHECK
Battery Remaining Longevity: 101 mo
Battery Voltage: 3 V
Brady Statistic RV Percent Paced: 0.01 %
Date Time Interrogation Session: 20220728003305
HighPow Impedance: 36 Ohm
HighPow Impedance: 48 Ohm
Implantable Lead Implant Date: 20081215
Implantable Lead Location: 753860
Implantable Lead Model: 6947
Implantable Pulse Generator Implant Date: 20190418
Lead Channel Impedance Value: 342 Ohm
Lead Channel Impedance Value: 456 Ohm
Lead Channel Pacing Threshold Amplitude: 0.875 V
Lead Channel Pacing Threshold Pulse Width: 0.4 ms
Lead Channel Sensing Intrinsic Amplitude: 13.875 mV
Lead Channel Sensing Intrinsic Amplitude: 13.875 mV
Lead Channel Setting Pacing Amplitude: 2.5 V
Lead Channel Setting Pacing Pulse Width: 0.4 ms
Lead Channel Setting Sensing Sensitivity: 0.3 mV

## 2021-02-26 DIAGNOSIS — C50511 Malignant neoplasm of lower-outer quadrant of right female breast: Secondary | ICD-10-CM | POA: Diagnosis not present

## 2021-02-26 DIAGNOSIS — Z171 Estrogen receptor negative status [ER-]: Secondary | ICD-10-CM | POA: Diagnosis not present

## 2021-03-01 NOTE — Progress Notes (Signed)
Remote ICD transmission.   

## 2021-03-16 ENCOUNTER — Other Ambulatory Visit: Payer: Self-pay | Admitting: Internal Medicine

## 2021-03-16 NOTE — Telephone Encounter (Signed)
Please refill as per office routine med refill policy (all routine meds to be refilled for 3 mo or monthly (per pt preference) up to one year from last visit, then month to month grace period for 3 mo, then further med refills will have to be denied) ? ?

## 2021-03-18 DIAGNOSIS — Z23 Encounter for immunization: Secondary | ICD-10-CM | POA: Diagnosis not present

## 2021-03-18 DIAGNOSIS — E875 Hyperkalemia: Secondary | ICD-10-CM | POA: Diagnosis not present

## 2021-03-18 DIAGNOSIS — I429 Cardiomyopathy, unspecified: Secondary | ICD-10-CM | POA: Diagnosis not present

## 2021-03-18 DIAGNOSIS — N1831 Chronic kidney disease, stage 3a: Secondary | ICD-10-CM | POA: Diagnosis not present

## 2021-03-18 DIAGNOSIS — D631 Anemia in chronic kidney disease: Secondary | ICD-10-CM | POA: Diagnosis not present

## 2021-03-18 DIAGNOSIS — C50511 Malignant neoplasm of lower-outer quadrant of right female breast: Secondary | ICD-10-CM | POA: Diagnosis not present

## 2021-03-18 DIAGNOSIS — N189 Chronic kidney disease, unspecified: Secondary | ICD-10-CM | POA: Diagnosis not present

## 2021-03-19 DIAGNOSIS — N390 Urinary tract infection, site not specified: Secondary | ICD-10-CM | POA: Diagnosis not present

## 2021-03-19 DIAGNOSIS — N1831 Chronic kidney disease, stage 3a: Secondary | ICD-10-CM | POA: Diagnosis not present

## 2021-03-22 IMAGING — CR CHEST - 2 VIEW
2 series · 2 of 2 positions shown · non-contrast
Comparison: 08/13/2013

CLINICAL DATA: Confirm port placement

EXAM:
CHEST - 2 VIEW

[w chest pa]
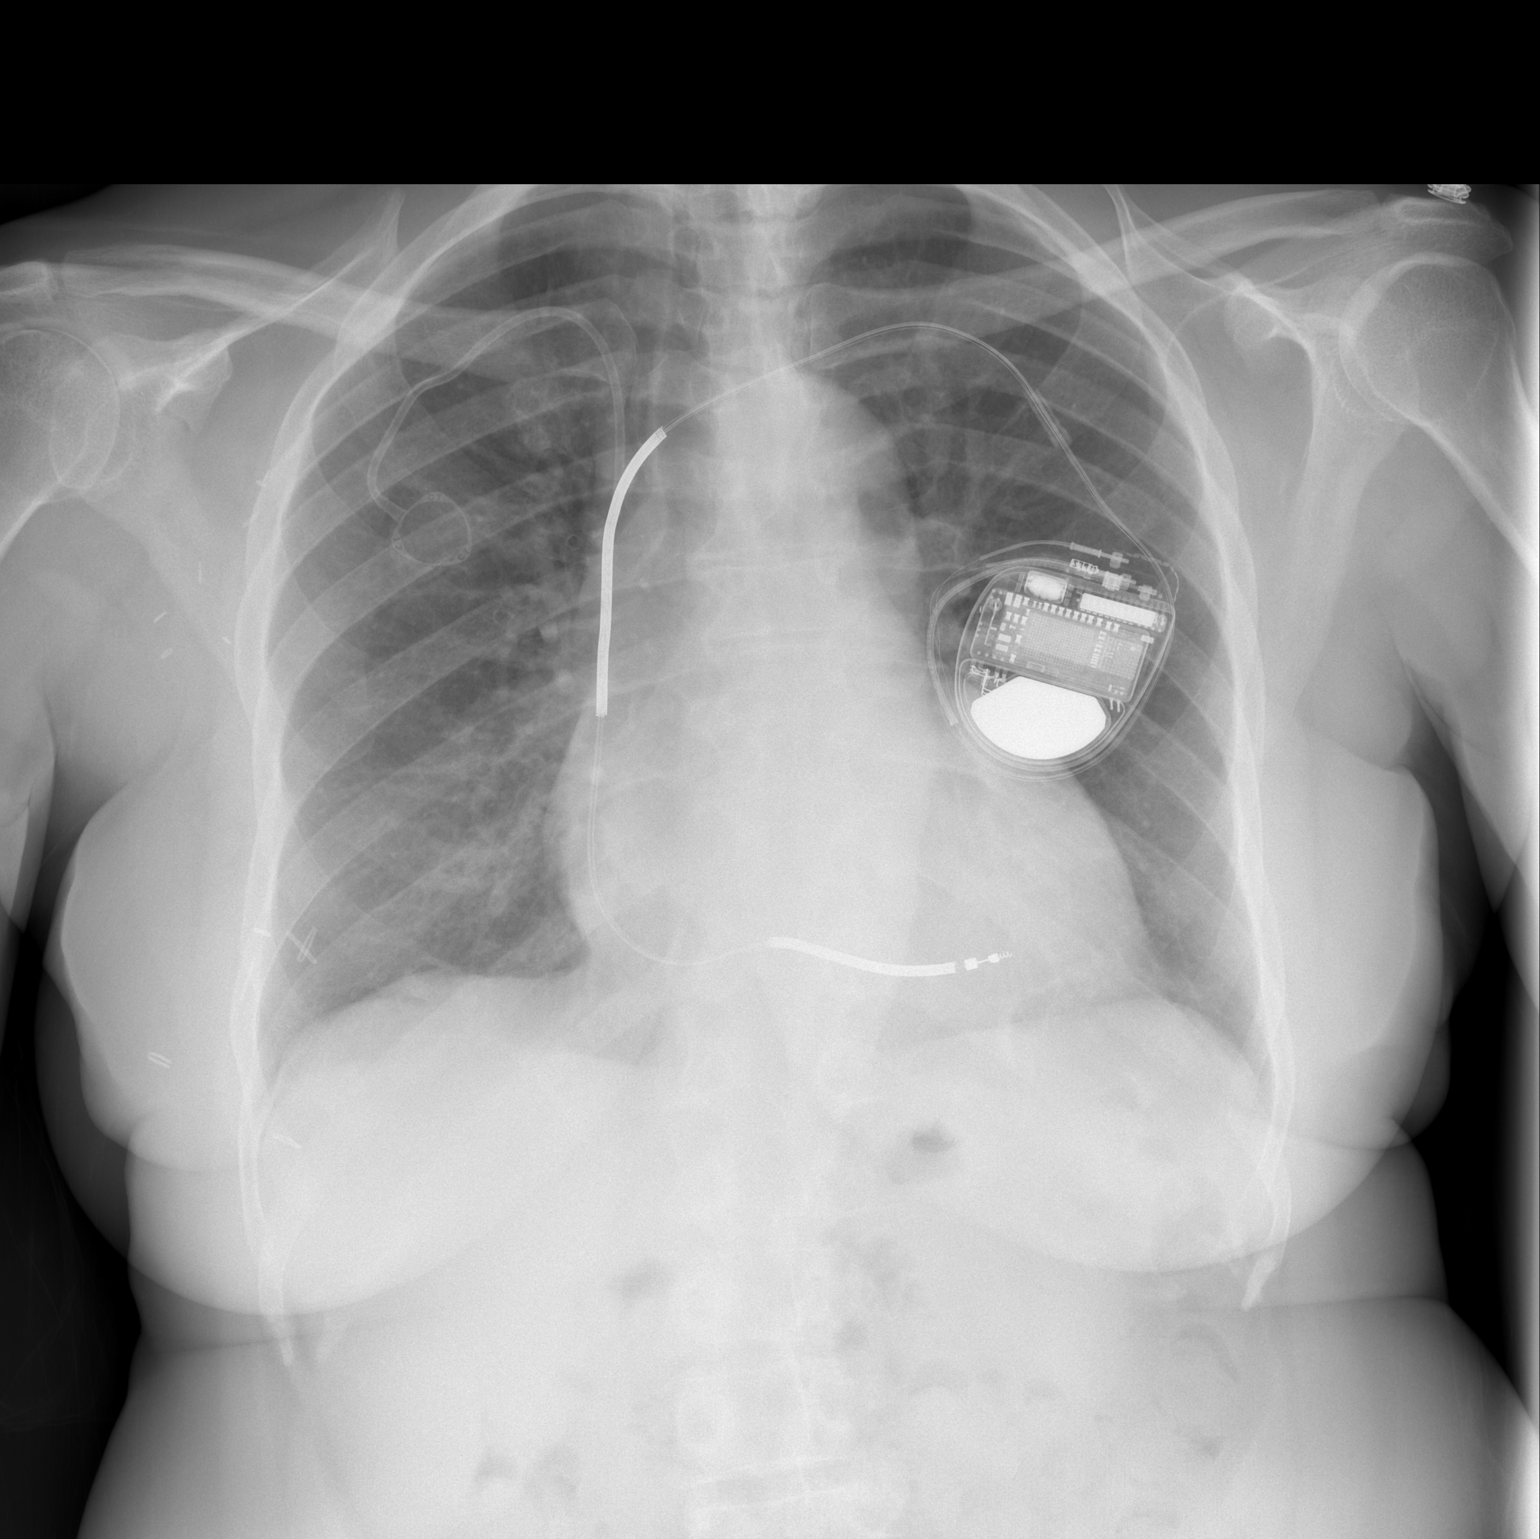

[w chest lat]
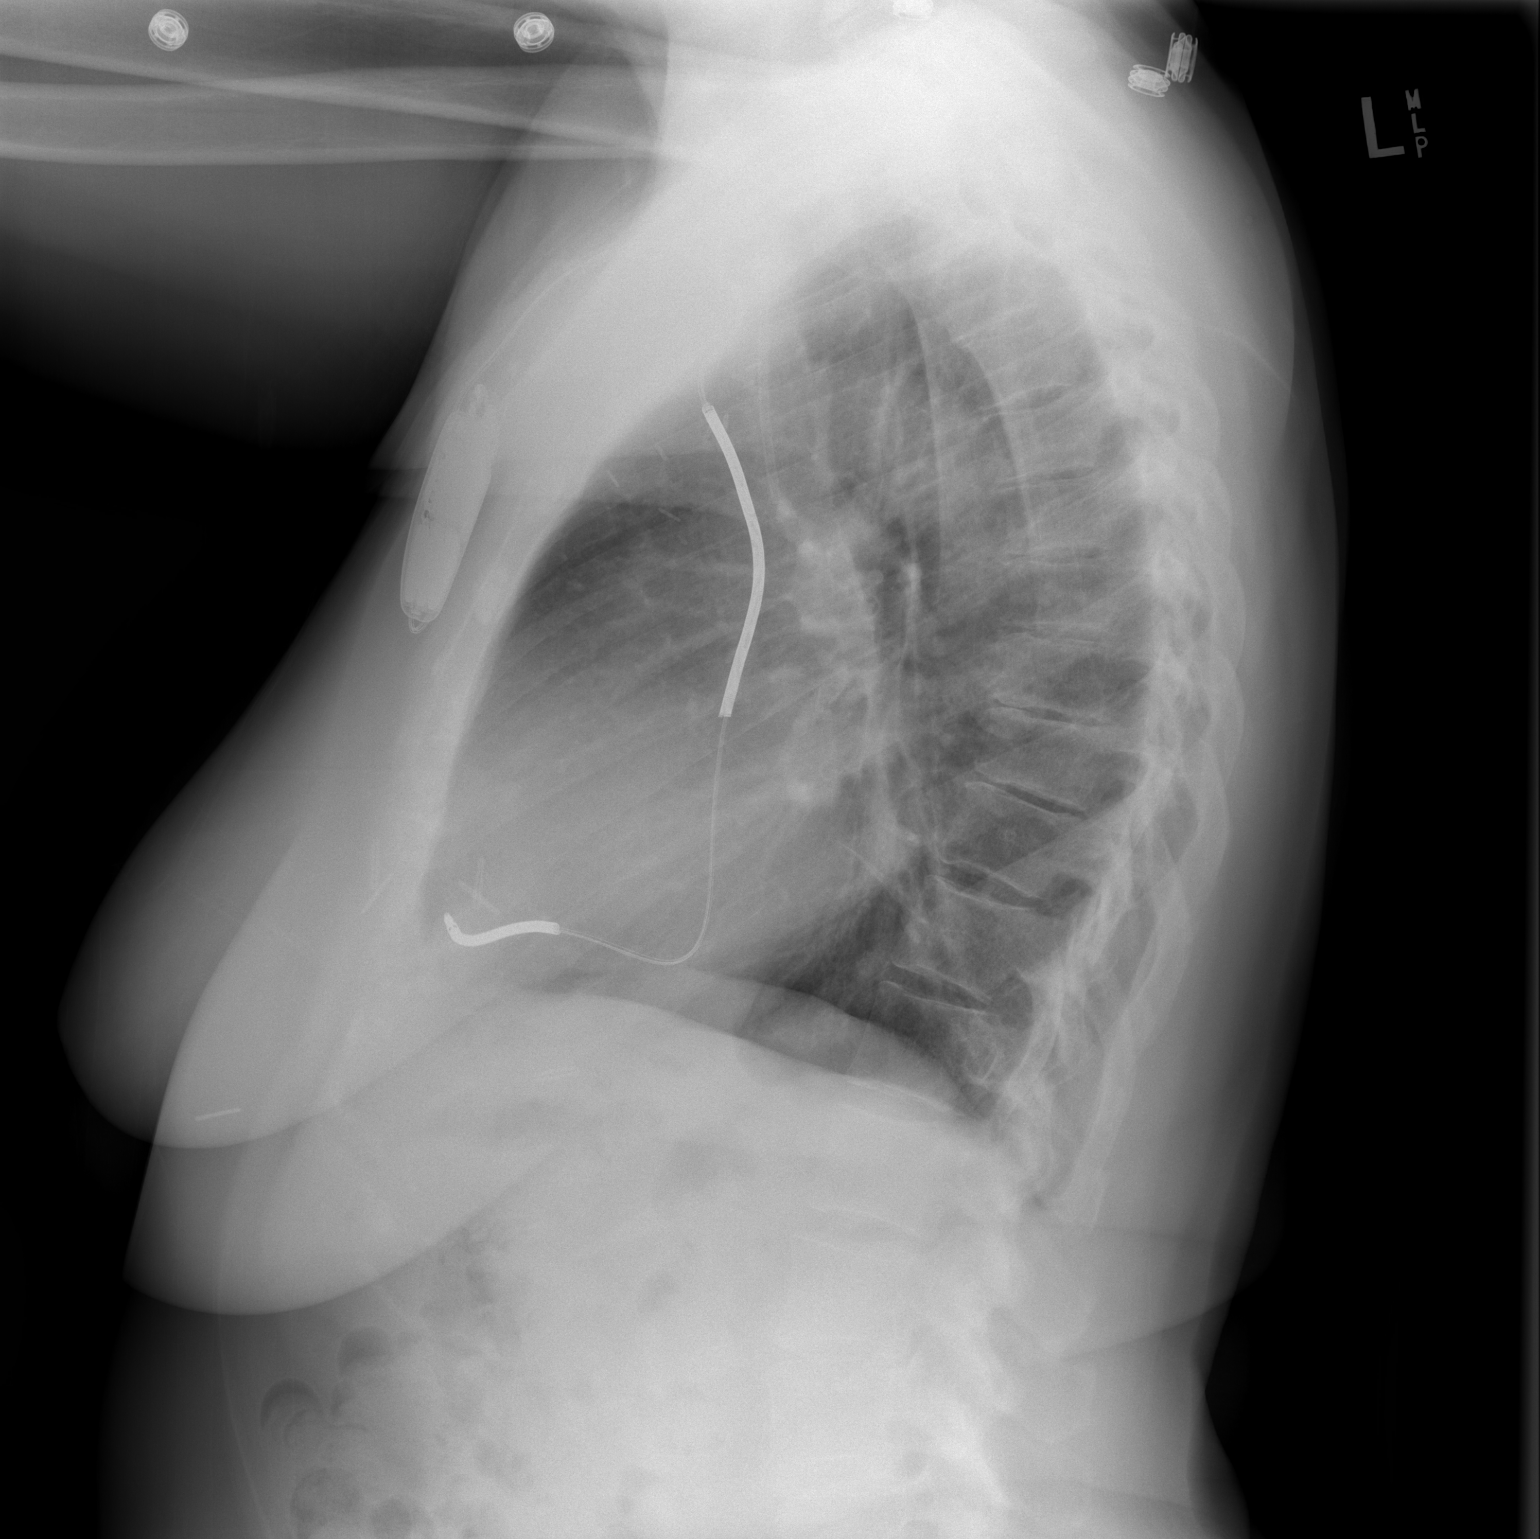

[2 of 2 positions shown; findings below may reference images not displayed]

FINDINGS: Single chamber ICD/pacer into the right ventricle. There is
cardiomegaly. Port on the right with tip superimposed on the pacer
lead, located at the SVC. No catheter kink. There is no edema,
consolidation, effusion, or pneumothorax.
IMPRESSION: Right-sided subclavian port with tip at the SVC.

## 2021-03-26 ENCOUNTER — Telehealth: Payer: Self-pay | Admitting: Internal Medicine

## 2021-03-26 NOTE — Telephone Encounter (Signed)
Per patient, her letter states that those 72 years and over need a letter.

## 2021-03-26 NOTE — Telephone Encounter (Signed)
Patient received letter in mail for jury duty.. start date 10.18.22  Patient would like for provider to give letter stating that patient is not able to do jury duty  Letter has to be submitted 2 weeks before date of jury duty so patient can be dismissed  Please let her know when letter is ready so she can pick up

## 2021-03-26 NOTE — Telephone Encounter (Signed)
No that would have to be incorrect  Her paper most likely says any person OVER 72 does NOT need letter,    thanks

## 2021-03-26 NOTE — Telephone Encounter (Signed)
Please ask pt to read the notice carefully regarding the age-out stipulation, which I believe is 80 yo, so no letter would be needed

## 2021-03-29 NOTE — Telephone Encounter (Signed)
Patient notified

## 2021-04-01 ENCOUNTER — Telehealth: Payer: Self-pay | Admitting: Cardiology

## 2021-04-01 NOTE — Telephone Encounter (Signed)
Patient called to say that she has jury duty on 04/27/2021, she would like for Dr Stanford Breed to write her later saying she cant do jury duty Because of the walking (she gets out of breath easily) and as well as the sitting. Please advise

## 2021-04-05 ENCOUNTER — Encounter: Payer: Self-pay | Admitting: *Deleted

## 2021-04-05 NOTE — Telephone Encounter (Signed)
Left message for pt to call.

## 2021-04-08 NOTE — Telephone Encounter (Signed)
Left message for pt to call.

## 2021-04-12 ENCOUNTER — Encounter: Payer: Self-pay | Admitting: *Deleted

## 2021-04-12 NOTE — Telephone Encounter (Signed)
Spoke with pt, letter created and placed in the mail to the patient.

## 2021-04-29 ENCOUNTER — Telehealth: Payer: Self-pay | Admitting: Cardiology

## 2021-04-29 NOTE — Telephone Encounter (Signed)
Patient calling the office for samples of medication:   1.  What medication and dosage are you requesting samples for? ENTRESTO 24-26 MG  2.  Are you currently out of this medication? Yes, patient states the price has tripled

## 2021-04-30 NOTE — Telephone Encounter (Signed)
Patient calling back for samples.

## 2021-04-30 NOTE — Telephone Encounter (Signed)
Spoke to patient  entresto 24/26 mg samples along with a patient assistance application left at front desk.Advised to bring application along with proof of income back to office and give to Dr.Crenshaw's RN.

## 2021-05-03 ENCOUNTER — Other Ambulatory Visit: Payer: Self-pay | Admitting: Cardiology

## 2021-05-03 ENCOUNTER — Other Ambulatory Visit: Payer: Self-pay | Admitting: Internal Medicine

## 2021-05-03 DIAGNOSIS — I5022 Chronic systolic (congestive) heart failure: Secondary | ICD-10-CM

## 2021-05-04 ENCOUNTER — Ambulatory Visit (INDEPENDENT_AMBULATORY_CARE_PROVIDER_SITE_OTHER): Payer: Medicare Other

## 2021-05-04 DIAGNOSIS — I428 Other cardiomyopathies: Secondary | ICD-10-CM

## 2021-05-04 LAB — CUP PACEART REMOTE DEVICE CHECK
Battery Remaining Longevity: 97 mo
Battery Voltage: 2.99 V
Brady Statistic RV Percent Paced: 0.01 %
Date Time Interrogation Session: 20221025042205
HighPow Impedance: 38 Ohm
HighPow Impedance: 47 Ohm
Implantable Lead Implant Date: 20081215
Implantable Lead Location: 753860
Implantable Lead Model: 6947
Implantable Pulse Generator Implant Date: 20190418
Lead Channel Impedance Value: 323 Ohm
Lead Channel Impedance Value: 399 Ohm
Lead Channel Pacing Threshold Amplitude: 0.875 V
Lead Channel Pacing Threshold Pulse Width: 0.4 ms
Lead Channel Sensing Intrinsic Amplitude: 14.125 mV
Lead Channel Sensing Intrinsic Amplitude: 14.125 mV
Lead Channel Setting Pacing Amplitude: 2.5 V
Lead Channel Setting Pacing Pulse Width: 0.4 ms
Lead Channel Setting Sensing Sensitivity: 0.3 mV

## 2021-05-04 NOTE — Telephone Encounter (Signed)
Please refill as per office routine med refill policy (all routine meds to be refilled for 3 mo or monthly (per pt preference) up to one year from last visit, then month to month grace period for 3 mo, then further med refills will have to be denied) ? ?

## 2021-05-10 ENCOUNTER — Encounter: Payer: Medicare Other | Admitting: Internal Medicine

## 2021-05-12 NOTE — Progress Notes (Signed)
Remote ICD transmission.   

## 2021-05-17 NOTE — Progress Notes (Signed)
Patient Care Team: Biagio Borg, MD as PCP - General Stanford Breed, Denice Bors, MD as Consulting Physician (Cardiology) Deboraha Sprang, MD as Consulting Physician (Cardiology) Nicholas Lose, MD as Consulting Physician (Hematology and Oncology) Stark Klein, MD as Consulting Physician (General Surgery) Eppie Gibson, MD as Attending Physician (Radiation Oncology)  DIAGNOSIS:    ICD-10-CM   1. Malignant neoplasm of lower-outer quadrant of right breast of female, estrogen receptor negative ([place])  C50.511    Z17.1       SUMMARY OF ONCOLOGIC HISTORY: Oncology History  Malignant neoplasm of lower-outer quadrant of right breast of female, estrogen receptor negative ([place])  08/29/2018 Initial Diagnosis   Screening mammogram detected 2 adjacent masses right breast 8:30 position LOQ 8 cm from nipple 1.1 cm and 5 mm, combined mass 1.7 cm, axilla negative, biopsy ([id number]): Grade 3 IDC triple negative, Ki-67 80%, T1CN0 stage 1B clinical stage   09/05/2018 Cancer Staging   Staging form: Breast, AJCC 8th Edition - Clinical stage from 09/05/2018: Stage IB (cT1c, cN0, cM0, G3, ER-, PR-, HER2-) - Signed by Nicholas Lose, MD on 09/05/2018    10/11/2018 Surgery   Right lumpectomy ([id number]): Grade 3 IDC, 1.6 cm, 0/3 lymph nodes negative, superior margin broadly positive, triple negative, Ki-67 80%, T1CN0 stage Ib. 0/3 lymph nodes positive for carcinoma.   10/16/2018 Cancer Staging   Staging form: Breast, AJCC 8th Edition - Pathologic stage from 10/16/2018: Stage IB (pT1c, pN0, cM0, G3, ER-, PR-, HER2-) - Signed by Nicholas Lose, MD on 10/16/2018    10/23/2018 Surgery   Reexcision [id number]): margins negative for carcinoma   11/15/2018 - 02/28/2019 Chemotherapy   The patient had palonosetron (ALOXI) injection 0.25 mg, 0.25 mg, Intravenous,  Once, 6 of 6 cycles Administration: 0.25 mg (11/15/2018), 0.25 mg (12/06/2018), 0.25 mg (12/27/2018), 0.25 mg (01/17/2019), 0.25 mg (02/07/2019), 0.25 mg  (02/28/2019) methotrexate (PF) chemo injection 65 mg, 84.5 mg, Intravenous,  Once, 6 of 6 cycles Dose modification: 30 mg/m2 (original dose 40 mg/m2, Cycle 2, Reason: Change in SCr/CrCl, Comment: CrCl 53), 25 mg/m2 (original dose 40 mg/m2, Cycle 5, Reason: Dose not tolerated) Administration: 65 mg (11/15/2018), 65 mg (12/06/2018), 65 mg (12/27/2018), 63.25 mg (01/17/2019), 53 mg (02/07/2019), 53 mg (02/28/2019) cyclophosphamide (CYTOXAN) 1,260 mg in sodium chloride 0.9 % 250 mL chemo infusion, 600 mg/m2 = 1,260 mg, Intravenous,  Once, 6 of 6 cycles Dose modification: 500 mg/m2 (original dose 600 mg/m2, Cycle 5, Reason: Provider Judgment) Administration: 1,260 mg (11/15/2018), 1,260 mg (12/06/2018), 1,260 mg (12/27/2018), 1,260 mg (01/17/2019), 1,060 mg (02/07/2019), 1,060 mg (02/28/2019) fluorouracil (ADRUCIL) chemo injection 1,250 mg, 600 mg/m2 = 1,250 mg, Intravenous,  Once, 6 of 6 cycles Dose modification: 400 mg/m2 (original dose 600 mg/m2, Cycle 5, Reason: Dose not tolerated) Administration: 1,250 mg (11/15/2018), 1,250 mg (12/06/2018), 1,250 mg (12/27/2018), 1,250 mg (01/17/2019), 850 mg (02/07/2019), 850 mg (02/28/2019)   for chemotherapy treatment.     03/26/2019 - 04/19/2019 Radiation Therapy   40.05 Gy in 15 fractions to the right breast with 2 tangential fields. Followed by a boost.     CHIEF COMPLIANT: Follow-up of history of breast cancer  INTERVAL HISTORY: [name] [name] is a 80 y.o. with above-mentioned history of triple negative breast cancer is currently on surveillance. Mammogram on 09/22/2020 showed no evidence of malignancy bilaterally. She presents to the clinic today for follow-up.  She continues to have tenderness in bilateral breasts.  Denies any lumps or nodules.  ALLERGIES:  is allergic to tramadol, topamax [topiramate], and zocor [  simvastatin - high dose].  MEDICATIONS:  Current Outpatient Medications  Medication Sig Dispense Refill   albuterol (PROVENTIL HFA;VENTOLIN HFA) 108 (90 Base)  MCG/ACT inhaler Inhale 1 puff into the lungs every 6 (six) hours as needed for wheezing or shortness of breath.     azithromycin (ZITHROMAX) 250 MG tablet 2 tab by mouth day 1, then 1 per day 6 tablet 1   carvedilol (COREG) 25 MG tablet Take 0.5 tablets (12.5 mg total) by mouth 2 (two) times daily. 90 tablet 1   cholecalciferol (VITAMIN D3) 25 MCG (1000 UT) tablet Take 1,000 Units by mouth daily.     ENTRESTO 24-26 MG TAKE 1 TABLET BY MOUTH 2 (TWO) TIMES DAILY 60 tablet 5   estradiol (ESTRACE) 1 MG tablet Take 1 mg by mouth daily.     furosemide (LASIX) 40 MG tablet TAKE 1 TABLET BY MOUTH  TWICE DAILY 180 tablet 1   ibuprofen (ADVIL) 800 MG tablet TAKE 1 TABLET (800 MG TOTAL) EVERY 8 (EIGHT) HOURS AS NEEDED BY MOUTH. 40 tablet 2   lidocaine-prilocaine (EMLA) cream APPLY TO AFFECTED AREA ONCE AS DIRECTED 30 g 3   meloxicam (MOBIC) 15 MG tablet TAKE 1 TABLET BY MOUTH EVERY DAY AS NEEDED FOR PAIN 90 tablet 3   montelukast (SINGULAIR) 10 MG tablet TAKE 1 TABLET BY MOUTH EVERY DAY 90 tablet 1   Multiple Vitamin (MULTIVITAMIN WITH MINERALS) TABS tablet Take 1 tablet by mouth daily.     rizatriptan (MAXALT) 10 MG tablet Take 10 mg by mouth as needed for migraine. May repeat in 2 hours if needed     scopolamine (TRANSDERM-SCOP, 1.5 MG,) 1 MG/3DAYS Place 1 patch (1.5 mg total) onto the skin every 3 (three) days. 10 patch 0   spironolactone (ALDACTONE) 25 MG tablet TAKE ONE-HALF TABLET BY  MOUTH DAILY 45 tablet 0   traMADol (ULTRAM) 50 MG tablet Take 1 tablet (50 mg total) by mouth every 6 (six) hours as needed. (Patient not taking: Reported on 11/25/2020) 120 tablet 2   No current facility-administered medications for this visit.    PHYSICAL EXAMINATION: ECOG PERFORMANCE STATUS: 1 - Symptomatic but completely ambulatory  Vitals:   05/18/21 1538  BP: 125/61  Pulse: 84  Resp: 18  Temp: (!) 97.3 F (36.3 C)  SpO2: 99%   Filed Weights   05/18/21 1538  Weight: 210 lb 12.8 oz (95.6 kg)     BREAST: Tenderness in bilateral breasts. (exam performed in the presence of a chaperone)  LABORATORY DATA:  I have reviewed the data as listed CMP Latest Ref Rng & Units 04/03/2020 10/29/2019 05/03/2019  Glucose 65 - 99 mg/dL 100(H) 115(H) 102(H)  BUN 8 - 27 mg/dL 19 18 21   Creatinine 0.57 - 1.00 mg/dL 1.49(H) 1.41(H) 1.55(H)  Sodium 134 - 144 mmol/L 141 142 141  Potassium 3.5 - 5.2 mmol/L 4.6 5.4(H) 4.6  Chloride 96 - 106 mmol/L 102 105 106  CO2 20 - 29 mmol/L 27 30 25   Calcium 8.7 - 10.3 mg/dL 9.5 9.5 9.5  Total Protein 6.0 - 8.3 g/dL - 7.2 -  Total Bilirubin 0.2 - 1.2 mg/dL - 0.4 -  Alkaline Phos 39 - 117 U/L - 79 -  AST 0 - 37 U/L - 13 -  ALT 0 - 35 U/L - 9 -    Lab Results  Component Value Date   WBC 4.7 10/29/2019   HGB 11.2 (L) 10/29/2019   HCT 35.2 (L) 10/29/2019   MCV 82.8 10/29/2019  PLT 199.0 10/29/2019   NEUTROABS 2.4 10/29/2019    ASSESSMENT & PLAN:  Malignant neoplasm of lower-outer quadrant of right breast of female, estrogen receptor negative ([place]) 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%, T1CN0 stage Ib   Review: Superior margin was broadly positive: Patient had surgery 10/23/2018: Benign Peripheral blood flow cytometry: Monoclonal B-cell population kappa restricted lymphocytes CD20 positive but lacks CD5, CD10, CD23 or CD103: B-cell lymphoproliferative process   Treatment plan: 1.  Adjuvant chemotherapy with CMF x6 cycles 11/15/2018-02/28/2019 2.  Followed by adjuvant radiation 03/26/2019-04/19/2019 ----------------------------------------------------------------------------------------------------------------------------------------------------- Breast cancer surveillance: 1.  Breast exam 05/18/2021 benign, tenderness in the right and left breast without any palpable nodularity. 2.  Mammogram  09/28/2020: [facility]: Benign breast density category B   Severe fatigue:  B12 did not make a difference.   Return to clinic in 1 year for follow-up      No orders of the defined types were placed in this encounter.  The patient has a good understanding of the overall plan. she agrees with it. she will call with any problems that may develop before the next visit here.  Total time spent: 20 mins including face to face time and time spent for planning, charting and coordination of care  Rulon Eisenmenger, MD, MPH 05/18/2021  I, Thana Ates, am acting as scribe for Dr. Nicholas Lose.  I have reviewed the above documentation for accuracy and completeness, and I agree with the above.

## 2021-05-18 ENCOUNTER — Inpatient Hospital Stay: Payer: Medicare Other | Attending: Hematology and Oncology | Admitting: Hematology and Oncology

## 2021-05-18 ENCOUNTER — Ambulatory Visit: Payer: Medicare Other | Admitting: Hematology and Oncology

## 2021-05-18 ENCOUNTER — Other Ambulatory Visit: Payer: Self-pay

## 2021-05-18 DIAGNOSIS — Z79899 Other long term (current) drug therapy: Secondary | ICD-10-CM | POA: Diagnosis not present

## 2021-05-18 DIAGNOSIS — Z923 Personal history of irradiation: Secondary | ICD-10-CM | POA: Diagnosis not present

## 2021-05-18 DIAGNOSIS — Z9221 Personal history of antineoplastic chemotherapy: Secondary | ICD-10-CM | POA: Diagnosis not present

## 2021-05-18 DIAGNOSIS — R5383 Other fatigue: Secondary | ICD-10-CM | POA: Diagnosis not present

## 2021-05-18 DIAGNOSIS — C50511 Malignant neoplasm of lower-outer quadrant of right female breast: Secondary | ICD-10-CM | POA: Insufficient documentation

## 2021-05-18 DIAGNOSIS — Z171 Estrogen receptor negative status [ER-]: Secondary | ICD-10-CM | POA: Diagnosis not present

## 2021-05-18 NOTE — Assessment & Plan Note (Signed)
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%, T1CN0 stage Ib  Review: Superior marginwas broadly positive: Patient had surgery 10/23/2018: Benign Peripheral blood flow cytometry: Monoclonal B-cell population kappa restricted lymphocytes CD20 positive but lacks CD5, CD10, CD23 or CD103: B-cell lymphoproliferative process  Treatment plan: 1.Adjuvant chemotherapy with CMF x6 cycles tentative start date 11/15/2018-02/28/2019 2.Followed by adjuvant radiation9/15/2020-04/19/2019 ----------------------------------------------------------------------------------------------------------------------------------------------------- Breast cancer surveillance: 1.Breast exam 05/18/2021 benign 2.Mammogram  09/28/2020: [facility]: Benign breast density category B  Severe fatigue: B12 did not make a difference. Continues to have metallic taste in the mouth. Port scar: Still causing tenderness  Return to clinic in 1 year for follow-up

## 2021-05-20 ENCOUNTER — Other Ambulatory Visit: Payer: Self-pay | Admitting: Internal Medicine

## 2021-05-20 NOTE — Telephone Encounter (Signed)
Needs ROV 

## 2021-05-26 ENCOUNTER — Telehealth: Payer: Self-pay | Admitting: Hematology and Oncology

## 2021-05-26 NOTE — Telephone Encounter (Signed)
Sch per 118 inbasket, pt aware

## 2021-05-31 ENCOUNTER — Encounter: Payer: Self-pay | Admitting: Internal Medicine

## 2021-05-31 ENCOUNTER — Other Ambulatory Visit: Payer: Self-pay

## 2021-05-31 ENCOUNTER — Ambulatory Visit (INDEPENDENT_AMBULATORY_CARE_PROVIDER_SITE_OTHER): Payer: Medicare Other | Admitting: Internal Medicine

## 2021-05-31 VITALS — BP 118/62 | HR 80 | Temp 98.6°F | Resp 16 | Ht 67.0 in | Wt 210.8 lb

## 2021-05-31 DIAGNOSIS — R1310 Dysphagia, unspecified: Secondary | ICD-10-CM | POA: Insufficient documentation

## 2021-05-31 DIAGNOSIS — R1312 Dysphagia, oropharyngeal phase: Secondary | ICD-10-CM

## 2021-05-31 DIAGNOSIS — R739 Hyperglycemia, unspecified: Secondary | ICD-10-CM | POA: Diagnosis not present

## 2021-05-31 DIAGNOSIS — N1831 Chronic kidney disease, stage 3a: Secondary | ICD-10-CM

## 2021-05-31 DIAGNOSIS — E7800 Pure hypercholesterolemia, unspecified: Secondary | ICD-10-CM

## 2021-05-31 DIAGNOSIS — I10 Essential (primary) hypertension: Secondary | ICD-10-CM

## 2021-05-31 DIAGNOSIS — R21 Rash and other nonspecific skin eruption: Secondary | ICD-10-CM | POA: Diagnosis not present

## 2021-05-31 MED ORDER — TRIAMCINOLONE ACETONIDE 0.1 % EX CREA: 1.0000 "application " | TOPICAL_CREAM | Freq: Two times a day (BID) | CUTANEOUS | 0 refills | Status: DC

## 2021-05-31 NOTE — Assessment & Plan Note (Signed)
Lab Results  Component Value Date   CREATININE 1.49 (H) 04/03/2020   Stable overall, cont to avoid nephrotoxins

## 2021-05-31 NOTE — Assessment & Plan Note (Signed)
Quite significant with wt loss, pt wary of referral or EGD but will try referral,  to f/u any worsening symptoms or concerns

## 2021-05-31 NOTE — Assessment & Plan Note (Signed)
BP Readings from Last 3 Encounters:  05/31/21 118/62  05/18/21 125/61  11/25/20 118/60   Stable, pt to continue medical treatment coreg, entresto, lasix

## 2021-05-31 NOTE — Assessment & Plan Note (Signed)
Lab Results  Component Value Date   HGBA1C 6.0 10/29/2019   Stable, pt to continue current medical treatment  - diet

## 2021-05-31 NOTE — Progress Notes (Signed)
Patient ID: [name] [name], female   DOB: 12-Mar-1941, 80 y.o.   MRN: [id number]        Chief Complaint: follow up HTN, HLD and hyperglycemia, ckd, rash, dysphagia       HPI:  [name] [name] is a 80 y.o. female here with c/o 1 mo worsening dysphagia at the back of the throat to meds, liquids and solids, Has lost wt and getting by with sips of fluids, but is very hesitant to see GI as is fearful of possible EGD and sedation with it.  Also has rash to left upper back for 2 wks with marked itching, just cant seem to stop scratching.  Trying to follow lower chol diet, and is fearful and not wanting the statin.  Pt denies chest pain, increased sob or doe, wheezing, orthopnea, PND, increased LE swelling, palpitations, dizziness or syncope.   Pt denies polydipsia, polyuria, or new focal neuro s/s.   Pt denies fever, night sweats, loss of appetite, or other constitutional symptoms       Wt Readings from Last 3 Encounters:  05/31/21 210 lb 12.8 oz (95.6 kg)  05/18/21 210 lb 12.8 oz (95.6 kg)  11/25/20 221 lb 9.6 oz (100.5 kg)   BP Readings from Last 3 Encounters:  05/31/21 118/62  05/18/21 125/61  11/25/20 118/60         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   OVERACTIVE BLADDER 08/03/2010   PYELONEPHRITIS, HX OF 40/98/1191   SYSTOLIC HEART FAILURE, CHRONIC 12/28/2008   VITAMIN D DEFICIENCY 06/17/2008   Past Surgical History:  Procedure Laterality Date   ABDOMINAL HYSTERECTOMY     BREAST LUMPECTOMY WITH RADIOACTIVE SEED AND  SENTINEL LYMPH NODE BIOPSY Right 10/11/2018   Procedure: RIGHT BREAST LUMPECTOMY WITH RADIOACTIVE SEED AND SENTINEL LYMPH NODE BIOPSY;  Surgeon: Stark Klein, MD;  Location: [facility];  Service: General;  Laterality: Right;   ICD GENERATOR CHANGEOUT N/A 10/25/2017   Procedure: ICD [place];  Surgeon: Deboraha Sprang, MD;  Location: [facility];  Service: Cardiovascular;  Laterality: N/A;   LAMINECTOMY     PORT-A-CATH REMOVAL N/A 05/08/2019   Procedure: REMOVAL OF PORT-A-CATH RIGHT CHEST;  Surgeon: Stark Klein, MD;  Location: [facility];  Service: General;  Laterality: N/A;   PORTACATH PLACEMENT N/A 10/11/2018   Procedure: INSERTION PORT-A-CATH;  Surgeon: Stark Klein, MD;  Location: [facility];  Service: General;  Laterality: N/A;   RE-EXCISION OF BREAST LUMPECTOMY Right 10/23/2018   Procedure: RE-EXCISION OF RIGHT BREAST LUMPECTOMY;  Surgeon: Stark Klein, MD;  Location: [facility];  Service: General;  Laterality: Right;    reports that she has never smoked. She has never used smokeless tobacco. She reports that she does not drink alcohol and does not use drugs. family history includes Arthritis in an other family member; Coronary artery disease in an other family member; Heart disease in her mother; Kidney disease in an other family member. Allergies  Allergen Reactions   Tramadol Nausea And Vomiting   Topamax [Topiramate] Other (See Comments)    Confusion,  gets lost going home   Zocor [Simvastatin - High Dose] Nausea Only    Nausea    Current Outpatient Medications on File Prior to Visit  Medication Sig Dispense Refill   albuterol (PROVENTIL HFA;VENTOLIN HFA) 108 (90 Base) MCG/ACT inhaler Inhale 1 puff into the lungs every 6 (six) hours as needed for wheezing or shortness of breath.     carvedilol (COREG) 25 MG tablet Take 0.5 tablets (12.5 mg total) by mouth 2 (two) times daily. 90 tablet 1   cholecalciferol (VITAMIN D3) 25 MCG (1000 UT) tablet Take 1,000 Units by mouth daily.      ENTRESTO 24-26 MG TAKE 1 TABLET BY MOUTH 2 (TWO) TIMES DAILY 60 tablet 5   estradiol (ESTRACE) 1 MG tablet Take 1 mg by mouth daily.     furosemide (LASIX) 40 MG tablet TAKE 1 TABLET BY MOUTH  TWICE DAILY 180 tablet 1   ibuprofen (ADVIL) 800 MG tablet TAKE 1 TABLET (800 MG TOTAL) EVERY 8 (EIGHT) HOURS AS NEEDED BY MOUTH. 40 tablet 2   lidocaine-prilocaine (EMLA) cream APPLY TO AFFECTED AREA ONCE AS DIRECTED 30 g 3   meloxicam (MOBIC) 15 MG tablet TAKE 1 TABLET BY MOUTH EVERY DAY AS NEEDED FOR PAIN 90 tablet 3   montelukast (SINGULAIR) 10 MG tablet TAKE 1 TABLET BY MOUTH EVERY DAY 90 tablet 1   Multiple Vitamin (MULTIVITAMIN WITH MINERALS) TABS tablet Take 1 tablet by mouth daily.     rizatriptan (MAXALT) 10 MG tablet Take 10 mg by mouth as needed for migraine. May repeat in 2 hours if needed     scopolamine (TRANSDERM-SCOP, 1.5 MG,) 1 MG/3DAYS Place 1 patch (1.5 mg total) onto the skin every 3 (three) days. 10 patch 0   spironolactone (ALDACTONE) 25 MG tablet TAKE ONE-HALF TABLET BY  MOUTH DAILY 45 tablet 0   traMADol (ULTRAM) 50 MG tablet Take 1 tablet (50 mg total) by mouth every 6 (six) hours as needed. 120 tablet 2   azithromycin (ZITHROMAX) 250 MG tablet 2 tab by mouth day 1, then 1 per day (Patient not taking: Reported on 05/31/2021) 6 tablet 1   [DISCONTINUED] prochlorperazine (COMPAZINE) 10 MG tablet TAKE 1 TABLET (10 MG TOTAL) BY MOUTH EVERY 6 (SIX) HOURS AS NEEDED (NAUSEA OR VOMITING). 30 tablet 1   No current facility-administered medications on file prior to visit.        ROS:  All others reviewed and negative.  Objective        PE:  BP 118/62 (BP Location: Left Arm, Patient Position: Sitting, Cuff Size: Large)   Pulse 80   Temp 98.6 F (37 C) (Oral)   Resp 16   Ht 5\' 7"  (1.702 m)   Wt 210 lb 12.8 oz (95.6 kg)   BMI 33.02 kg/m                 Constitutional: Pt appears in NAD               HENT: Head: NCAT.                Right Ear: External ear normal.                  Left Ear: External ear normal.                Eyes: . Pupils are equal, round, and reactive to light. Conjunctivae and EOM are normal  Nose: without d/c or deformity               Neck: Neck supple. Gross normal ROM               Cardiovascular: Normal rate and regular rhythm.                 Pulmonary/Chest: Effort normal and breath sounds without rales or wheezing.                Abd:  Soft, NT, ND, + BS, no organomegaly               Neurological: Pt is alert. At baseline orientation, motor grossly intact               Skin: LE edema - none, 2 cm area scaly erythema left upper back/base of neck               Psychiatric: Pt behavior is normal without agitation   Micro: none  Cardiac tracings I have personally interpreted today:  none  Pertinent Radiological findings (summarize): none   Lab Results  Component Value Date   WBC 4.7 10/29/2019   HGB 11.2 (L) 10/29/2019   HCT 35.2 (L) 10/29/2019   PLT 199.0 10/29/2019   GLUCOSE 100 (H) 04/03/2020   CHOL 204 (H) 10/29/2019   TRIG 133.0 10/29/2019   HDL 51.20 10/29/2019   LDLDIRECT 129.7 02/07/2013   LDLCALC 126 (H) 10/29/2019   ALT 9 10/29/2019   AST 13 10/29/2019   NA 141 04/03/2020   K 4.6 04/03/2020   CL 102 04/03/2020   CREATININE 1.49 (H) 04/03/2020   BUN 19 04/03/2020   CO2 27 04/03/2020   TSH 1.05 10/29/2019   INR 0.9 RATIO 06/18/2007   HGBA1C 6.0 10/29/2019   MICROALBUR 1.4 10/29/2019   Assessment/Plan:  CHIHIRO FREY is a 80 y.o. Black or African American [2] female with  has a past medical history of AICD (automatic cardioverter/defibrillator) present, ALLERGIC RHINITIS (11/06/2007), ANEMIA-IRON DEFICIENCY (05/23/2007), ANEMIA-NOS (06/17/2008), ANXIETY (02/22/2008), CARDIOMYOPATHY, PRIMARY, DILATED (12/30/2008), DEGENERATIVE DISC DISEASE, LUMBAR SPINE (05/07/2007), GERD (05/07/2007), History of kidney stones, HYPERLIPIDEMIA (05/23/2007), HYPERTENSION (05/07/2007), KELOID SCAR, HX OF (02/02/2009), MENOPAUSAL  DISORDER (05/06/2008), OSTEOARTHRITIS, KNEES, BILATERAL (05/23/2007), OVERACTIVE BLADDER (08/03/2010), PYELONEPHRITIS, HX OF (81/82/9937), SYSTOLIC HEART FAILURE, CHRONIC (12/28/2008), and VITAMIN D DEFICIENCY (06/17/2008).  Hyperlipidemia Lab Results  Component Value Date   LDLCALC 126 (H) 10/29/2019   Uncontrolled, goal ldl < 100, pt to continue current lower chol diet, declines statin and quite adamant   Essential hypertension BP Readings from Last 3 Encounters:  05/31/21 118/62  05/18/21 125/61  11/25/20 118/60   Stable, pt to continue medical treatment coreg, entresto, lasix   CKD (chronic kidney disease) stage 3, GFR 30-59 ml/min Lab Results  Component Value Date   CREATININE 1.49 (H) 04/03/2020   Stable overall, cont to avoid nephrotoxins   Hyperglycemia Lab Results  Component Value Date   HGBA1C 6.0 10/29/2019   Stable, pt to continue current medical treatment  - diet   Dysphagia Quite significant with wt loss, pt wary of referral or EGD but will try referral,  to f/u any worsening symptoms or concerns  Rash Mild to mod, for steroid cream prn, to f/u any worsening symptoms or concerns  Followup: No follow-ups on file.  Cathlean Cower, MD 05/31/2021 9:26 PM [facility]

## 2021-05-31 NOTE — Patient Instructions (Signed)
Please take all new medication as prescribed - the steroid cream for the left upper back  Please continue all other medications as before, and refills have been done if requested.  Please have the pharmacy call with any other refills you may need.  Please continue your efforts at being more active, low cholesterol diet, and weight control.  Please keep your appointments with your specialists as you may have planned  You will be contacted regarding the referral for: Gastroenterology  Please go to the LAB at the blood drawing area for the tests to be done - at the [place] as soon as you can  You will be contacted by phone if any changes need to be made immediately.  Otherwise, you will receive a letter about your results with an explanation, but please check with MyChart first.  Please remember to sign up for MyChart if you have not done so, as this will be important to you in the future with finding out test results, communicating by private email, and scheduling acute appointments online when needed.  Please make an Appointment to return in 6 months, or sooner if needed

## 2021-05-31 NOTE — Assessment & Plan Note (Signed)
Lab Results  Component Value Date   LDLCALC 126 (H) 10/29/2019   Uncontrolled, goal ldl < 100, pt to continue current lower chol diet, declines statin and quite adamant

## 2021-05-31 NOTE — Assessment & Plan Note (Signed)
Mild to mod, for steroid cream prn, to f/u any worsening symptoms or concerns

## 2021-06-15 ENCOUNTER — Other Ambulatory Visit: Payer: Self-pay

## 2021-06-15 ENCOUNTER — Encounter: Payer: Self-pay | Admitting: Internal Medicine

## 2021-06-15 ENCOUNTER — Ambulatory Visit (INDEPENDENT_AMBULATORY_CARE_PROVIDER_SITE_OTHER): Payer: Medicare Other

## 2021-06-15 ENCOUNTER — Ambulatory Visit: Payer: Medicare Other | Admitting: Internal Medicine

## 2021-06-15 VITALS — BP 114/67 | HR 79 | Wt 218.6 lb

## 2021-06-15 DIAGNOSIS — I472 Ventricular tachycardia, unspecified: Secondary | ICD-10-CM

## 2021-06-15 DIAGNOSIS — J449 Chronic obstructive pulmonary disease, unspecified: Secondary | ICD-10-CM

## 2021-06-15 DIAGNOSIS — I493 Ventricular premature depolarization: Secondary | ICD-10-CM | POA: Diagnosis not present

## 2021-06-15 DIAGNOSIS — I5022 Chronic systolic (congestive) heart failure: Secondary | ICD-10-CM

## 2021-06-15 DIAGNOSIS — I428 Other cardiomyopathies: Secondary | ICD-10-CM | POA: Diagnosis not present

## 2021-06-15 DIAGNOSIS — R0602 Shortness of breath: Secondary | ICD-10-CM

## 2021-06-15 NOTE — Progress Notes (Unsigned)
Enrolled patient for a 3 day Zio XT monitor to be mailed to patients home  

## 2021-06-15 NOTE — Patient Instructions (Addendum)
Medication Instructions:  Your physician has recommended you make the following change in your medication:   Increase your Furosemide to 80mg  (2 tablets) in the am and 40mg  (1 tablet) in the pm for 3 days then return to previous dosing.  *If you need a refill on your cardiac medications before your next appointment, please call your pharmacy*   Lab Work: None ordered.  If you have labs (blood work) drawn today and your tests are completely normal, you will receive your results only by: [place] (if you have MyChart) OR A paper copy in the mail If you have any lab test that is abnormal or we need to change your treatment, we will call you to review the results.   Testing/Procedures: [place]- Long Term Monitor Instructions  Your physician has requested you wear a ZIO patch monitor for 3 days.  This is a single patch monitor. [employer] supplies one patch monitor per enrollment. Additional stickers are not available. Please do not apply patch if you will be having a Nuclear Stress Test,  Echocardiogram, Cardiac CT, MRI, or Chest Xray during the period you would be wearing the  monitor. The patch cannot be worn during these tests. You cannot remove and re-apply the  ZIO XT patch monitor.  Your ZIO patch monitor will be mailed 3 day USPS to your address on file. It may take 3-5 days  to receive your monitor after you have been enrolled.  Once you have received your monitor, please review the enclosed instructions. Your monitor  has already been registered assigning a specific monitor serial # to you.  Billing and Patient Assistance Program Information  We have supplied [employer] with any of your insurance information on file for billing purposes. [employer] offers a sliding scale Patient Assistance Program for patients that do not have  insurance, or whose insurance does not completely cover the cost of the ZIO monitor.  You must apply for the Patient Assistance Program to qualify for  this discounted rate.  To apply, please call [employer] at [phone], select option 4, select option 2, ask to apply for  Patient Assistance Program. Theodore Demark will ask your household income, and how many people  are in your household. They will quote your out-of-pocket cost based on that information.  [employer] will also be able to set up a 29-month, interest-free payment plan if needed.  Applying the monitor   Shave hair from upper left chest.  Hold abrader disc by orange tab. Rub abrader in 40 strokes over the upper left chest as  indicated in your monitor instructions.  Clean area with 4 enclosed alcohol pads. Let dry.  Apply patch as indicated in monitor instructions. Patch will be placed under collarbone on left  side of chest with arrow pointing upward.  Rub patch adhesive wings for 2 minutes. Remove white label marked "1". Remove the white  label marked "2". Rub patch adhesive wings for 2 additional minutes.  While looking in a mirror, press and release button in center of patch. A small green light will  flash 3-4 times. This will be your only indicator that the monitor has been turned on.  Do not shower for the first 24 hours. You may shower after the first 24 hours.  Press the button if you feel a symptom. You will hear a small click. Record Date, Time and  Symptom in the Patient Logbook.  When you are ready to remove the patch, follow instructions on the last 2 pages of  Patient  Logbook. Stick patch monitor onto the last page of Patient Logbook.  Place Patient Logbook in the blue and white box. Use locking tab on box and tape box closed  securely. The blue and white box has prepaid postage on it. Please place it in the mailbox as  soon as possible. Your physician should have your test results approximately 7 days after the  monitor has been mailed back to [facility].  Call [facility] at [phone] if you have questions regarding  your ZIO XT patch monitor.  Call them immediately if you see an orange light blinking on your  monitor.  If your monitor falls off in less than 4 days, contact our Monitor department at [phone].  If your monitor becomes loose or falls off after 4 days call [employer] at [phone] for  suggestions on securing your monitor    Follow-Up: At [facility], you and your health needs are our priority.  As part of our continuing mission to provide you with exceptional heart care, we have created designated Provider Care Teams.  These Care Teams include your primary Cardiologist (physician) and Advanced Practice Providers (APPs -  Physician Assistants and Nurse Practitioners) who all work together to provide you with the care you need, when you need it.  We recommend signing up for the patient portal called "MyChart".  Sign up information is provided on this After Visit Summary.  MyChart is used to connect with patients for Virtual Visits (Telemedicine).  Patients are able to view lab/test results, encounter notes, upcoming appointments, etc.  Non-urgent messages can be sent to your provider as well.   To learn more about what you can do with MyChart, go to [email or web address].    Your next appointment:    12 months with Dr Caryl Comes  We have referred you to Pulmonology - someone will call you for this appointment.

## 2021-06-15 NOTE — Progress Notes (Signed)
Patient Care Team: Biagio Borg, MD as PCP - General Stanford Breed, Denice Bors, MD as Consulting Physician (Cardiology) Deboraha Sprang, MD as Consulting Physician (Cardiology) Nicholas Lose, MD as Consulting Physician (Hematology and Oncology) Stark Kaylon Laroche, MD as Consulting Physician (General Surgery) Eppie Gibson, MD as Attending Physician (Radiation Oncology)   HPI  [name] [name] is a 80 y.o. female seen in follow-up for ICD implanted 2008 for primary prevention in the setting of nonischemic heart disease. She has chronic systolic heart failure with a relatively narrow QRS and underwent single-chamber defibrillator implantation. 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  Declined.  Medical therapy recommended.   Today, She says that her heart has been doing better. She has a 61mm benign nodule in her heart/lung evaluation was concerned.  We reviewed the report together which was described as "definitively benign "  [name] [name] still experiences wheezing when she is laying down at night and she attributes this to her asthma. She reports that the wheezing can be felt in her chest, and she also experiences shortness of breath at this time.  Responds vigorously to her furosemide with about 4 hours of diuresis.  Fluid is restricted to less than 64 ounces   She was in her 25s. she has 3 kids and 5 grand kids, and 1 greatgrand daughter named Lovena Le who stays with her until next week when they move to an apartment.  She has an inhaler.  The patient denies chest pain, orthopnea.  There have been no palpitations, lightheadedness or syncope.     DATE TEST EF   11/08 MYOVIEW  34 %   3/15 Echo   15-20 %   10/18 Echo  25%   3/20 Echo  20-25%      Date PVCs  11/15 11%          Date Cr K Hgb  4/19 1.38 4.4 10.8  2/20  1.44 4.2 11.4  4/21  1.41 5.4 11.2  9/21 1.49 4.6 11.2   I  Interval diagnosis of breast cancer.  Triple negative.  Underwent lumpectomy and radiation and chemotherapy.  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   OVERACTIVE BLADDER 08/03/2010   PYELONEPHRITIS, HX OF 81/15/7262   SYSTOLIC HEART FAILURE, CHRONIC 12/28/2008   VITAMIN D DEFICIENCY 06/17/2008    Past Surgical History:  Procedure Laterality Date   ABDOMINAL HYSTERECTOMY     BREAST LUMPECTOMY WITH RADIOACTIVE SEED AND SENTINEL LYMPH NODE BIOPSY Right 10/11/2018   Procedure: RIGHT BREAST LUMPECTOMY WITH RADIOACTIVE SEED AND SENTINEL LYMPH NODE BIOPSY;  Surgeon: Stark Dekari Bures, MD;  Location: [place];  Service: General;  Laterality: Right;   ICD GENERATOR CHANGEOUT N/A 10/25/2017   Procedure: ICD [place];  Surgeon: Deboraha Sprang, MD;  Location: [place] [facility];  Service: Cardiovascular;  Laterality: N/A;   LAMINECTOMY     PORT-A-CATH REMOVAL N/A 05/08/2019   Procedure:  REMOVAL OF PORT-A-CATH RIGHT CHEST;  Surgeon: Stark , MD;  Location: [place];  Service: General;  Laterality: N/A;   PORTACATH PLACEMENT N/A 10/11/2018   Procedure: INSERTION PORT-A-CATH;  Surgeon: Stark , MD;  Location: [place];  Service: General;  Laterality: N/A;   RE-EXCISION OF BREAST LUMPECTOMY Right 10/23/2018   Procedure: RE-EXCISION OF RIGHT BREAST LUMPECTOMY;  Surgeon: Stark , MD;  Location: [facility];  Service: General;  Laterality:  Right;    Current Outpatient Medications  Medication Sig Dispense Refill   albuterol (PROVENTIL HFA;VENTOLIN HFA) 108 (90 Base) MCG/ACT inhaler Inhale 1 puff into the lungs every 6 (six) hours as needed for wheezing or shortness of breath.     carvedilol (COREG) 25 MG tablet Take 0.5 tablets (12.5 mg total) by mouth 2 (two) times daily. 90 tablet 1   cholecalciferol (VITAMIN D3) 25 MCG (1000 UT) tablet Take 1,000 Units by mouth daily.     ENTRESTO 24-26 MG TAKE 1 TABLET BY MOUTH 2 (TWO) TIMES DAILY 60 tablet 5   furosemide (LASIX) 40 MG tablet TAKE 1 TABLET BY MOUTH  TWICE DAILY 180 tablet 1   ibuprofen (ADVIL) 800 MG tablet TAKE 1 TABLET (800 MG TOTAL) EVERY 8 (EIGHT) HOURS AS NEEDED BY MOUTH. 40 tablet 2   lidocaine-prilocaine (EMLA) cream APPLY TO AFFECTED AREA ONCE AS DIRECTED 30 g 3   meloxicam (MOBIC) 15 MG tablet TAKE 1 TABLET BY MOUTH EVERY DAY AS NEEDED FOR PAIN 90 tablet 3   montelukast (SINGULAIR) 10 MG tablet TAKE 1 TABLET BY MOUTH EVERY DAY 90 tablet 1   Multiple Vitamin (MULTIVITAMIN WITH MINERALS) TABS tablet Take 1 tablet by mouth daily.     rizatriptan (MAXALT) 10 MG tablet Take 10 mg by mouth as needed for migraine. May repeat in 2 hours if needed     scopolamine (TRANSDERM-SCOP, 1.5 MG,) 1 MG/3DAYS Place 1 patch (1.5 mg total) onto the skin every 3 (three) days. 10 patch 0   spironolactone (ALDACTONE) 25 MG tablet TAKE ONE-HALF TABLET BY  MOUTH DAILY 45 tablet 0   traMADol (ULTRAM) 50 MG tablet Take 1 tablet (50 mg total) by mouth every 6 (six) hours as needed. 120 tablet 2   triamcinolone cream (KENALOG) 0.1 % Apply 1 application topically 2 (two) times daily. 30 g 0   azithromycin (ZITHROMAX) 250 MG tablet 2 tab by mouth day 1, then 1 per day (Patient not taking: Reported on 06/15/2021) 6 tablet 1   estradiol (ESTRACE) 1 MG tablet Take 1 mg by mouth daily. (Patient not taking: Reported on 06/15/2021)     No current facility-administered medications for this visit.     Allergies  Allergen Reactions   Tramadol Nausea And Vomiting   Topamax [Topiramate] Other (See Comments)    Confusion, gets lost going home   Zocor [Simvastatin - High Dose] Nausea Only    Nausea     Review of Systems negative except from HPI and PMH (+) Shortness of breath (+) Wheezing (+) Edema (in her feet and neck) (+) Asthma     Physical Exam BP 114/67   Pulse 79   Wt 218 lb 9.6 oz (99.2 kg)   SpO2 97%   BMI 34.24 kg/m  Well developed and well nourished in no acute distress HENT normal Neck supple with JVP->10 clear Device pocket well healed; without hematoma or erythema.  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  Cardiopulmonary stress testing 3/22 suggested obesity and her chronotropic incompetence were contributing.  We will decrease her carvedilol to 3.125 twice daily.  She has volume overloaded.  We will have her for 3 days increase her furosemide from 40/40--80/40.  Concurrent with this is an increase in the OptiVol  Continue her on her Aldactone and Entresto 24/26 for her cardiomyopathy.   I,Tinashe Williams,acting as a Education administrator for [name], MD.,have documented all relevant documentation on the behalf of [name], MD,as directed by  [name], MD while in the presence of [name], MD.   I, [name], MD, have reviewed all documentation for this visit. The documentation on  06/15/21 for the exam, diagnosis, procedures, and orders are all accurate and complete.

## 2021-06-17 LAB — CUP PACEART INCLINIC DEVICE CHECK
Battery Remaining Longevity: 94 mo
Battery Voltage: 2.99 V
Brady Statistic RV Percent Paced: 0.01 %
Date Time Interrogation Session: 20221206165100
HighPow Impedance: 38 Ohm
HighPow Impedance: 45 Ohm
Implantable Lead Implant Date: 20081215
Implantable Lead Location: 753860
Implantable Lead Model: 6947
Implantable Pulse Generator Implant Date: 20190418
Lead Channel Impedance Value: 323 Ohm
Lead Channel Impedance Value: 437 Ohm
Lead Channel Pacing Threshold Amplitude: 0.75 V
Lead Channel Pacing Threshold Pulse Width: 0.4 ms
Lead Channel Sensing Intrinsic Amplitude: 16 mV
Lead Channel Sensing Intrinsic Amplitude: 20.875 mV
Lead Channel Setting Pacing Amplitude: 2.5 V
Lead Channel Setting Pacing Pulse Width: 0.4 ms
Lead Channel Setting Sensing Sensitivity: 0.3 mV

## 2021-06-20 DIAGNOSIS — I493 Ventricular premature depolarization: Secondary | ICD-10-CM | POA: Diagnosis not present

## 2021-06-30 DIAGNOSIS — I493 Ventricular premature depolarization: Secondary | ICD-10-CM | POA: Diagnosis not present

## 2021-07-14 NOTE — Progress Notes (Signed)
HPI:FU nonischemic cardiomyopathy. Cardiac catheterization August 2004 showed normal coronary arteries. 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 Patient was referred to Dr. Lovena Le by Dr. Caryl Comes for consideration of ablation but Dr. Lovena Le felt medical therapy indicated. Nuclear study repeated April 2018 and showed ejection fraction 35% with no ischemia or infarction. Last echocardiogram March 2020 showed ejection fraction 20 to 25%, severe left ventricular enlargement, grade 2 diastolic dysfunction, mild left atrial enlargement.  CPX March 2022 showed low normal functional capacity with primary limitation associated with body habitus and patient hyperventilating at peak exercise.  Monitor 12/22 showed sinus rhythm with nonsustained ventricular tachycardia longest 7 beats, brief PAT longest lasting 12.7 seconds and frequent PVCs (7.3%). Since I last saw her, she has some dyspnea on exertion but denies orthopnea, PND, pedal edema, chest pain or syncope.  Current Outpatient Medications  Medication Sig Dispense Refill   albuterol (PROVENTIL HFA;VENTOLIN HFA) 108 (90 Base) MCG/ACT inhaler Inhale 1 puff into the lungs every 6 (six) hours as needed for wheezing or shortness of breath.     carvedilol (COREG) 25 MG tablet Take 0.5 tablets (12.5 mg total) by mouth 2 (two) times daily. 90 tablet 1   cholecalciferol (VITAMIN D3) 25 MCG (1000 UT) tablet Take 1,000 Units by mouth daily.     ENTRESTO 24-26 MG TAKE 1 TABLET BY MOUTH 2 (TWO) TIMES DAILY 60 tablet 5   furosemide (LASIX) 40 MG tablet TAKE 1 TABLET BY MOUTH  TWICE DAILY 180 tablet 1   ibuprofen (ADVIL) 800 MG tablet TAKE 1 TABLET (800 MG TOTAL) EVERY 8 (EIGHT) HOURS AS NEEDED BY MOUTH. 40 tablet 2   lidocaine-prilocaine (EMLA) cream APPLY TO AFFECTED AREA ONCE AS DIRECTED 30 g 3   meloxicam (MOBIC) 15 MG tablet TAKE 1 TABLET BY MOUTH EVERY DAY AS NEEDED FOR PAIN 90 tablet 3    montelukast (SINGULAIR) 10 MG tablet TAKE 1 TABLET BY MOUTH EVERY DAY 90 tablet 1   Multiple Vitamin (MULTIVITAMIN WITH MINERALS) TABS tablet Take 1 tablet by mouth daily.     rizatriptan (MAXALT) 10 MG tablet Take 10 mg by mouth as needed for migraine. May repeat in 2 hours if needed     spironolactone (ALDACTONE) 25 MG tablet TAKE ONE-HALF TABLET BY  MOUTH DAILY 45 tablet 0   traMADol (ULTRAM) 50 MG tablet Take 1 tablet (50 mg total) by mouth every 6 (six) hours as needed. 120 tablet 2   azithromycin (ZITHROMAX) 250 MG tablet 2 tab by mouth day 1, then 1 per day (Patient not taking: Reported on 06/15/2021) 6 tablet 1   estradiol (ESTRACE) 1 MG tablet Take 1 mg by mouth daily. (Patient not taking: Reported on 06/15/2021)     scopolamine (TRANSDERM-SCOP, 1.5 MG,) 1 MG/3DAYS Place 1 patch (1.5 mg total) onto the skin every 3 (three) days. (Patient not taking: Reported on 07/27/2021) 10 patch 0   triamcinolone cream (KENALOG) 0.1 % Apply 1 application topically 2 (two) times daily. (Patient not taking: Reported on 07/27/2021) 30 g 0   No current facility-administered medications for this visit.     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   OVERACTIVE BLADDER 08/03/2010   PYELONEPHRITIS, HX OF 40/98/1191   SYSTOLIC HEART FAILURE, CHRONIC 12/28/2008   VITAMIN D DEFICIENCY 06/17/2008    Past Surgical History:  Procedure Laterality Date   ABDOMINAL HYSTERECTOMY     BREAST LUMPECTOMY WITH RADIOACTIVE SEED AND SENTINEL LYMPH  NODE BIOPSY Right 10/11/2018   Procedure: RIGHT BREAST LUMPECTOMY WITH RADIOACTIVE SEED AND SENTINEL LYMPH NODE BIOPSY;  Surgeon: Stark Klein, MD;  Location: [place];  Service: General;  Laterality: Right;   ICD GENERATOR CHANGEOUT N/A 10/25/2017   Procedure: ICD GENERATOR CHANGEOUT;  Surgeon: Deboraha Sprang, MD;  Location: [place] [facility];  Service: Cardiovascular;  Laterality: N/A;   LAMINECTOMY     PORT-A-CATH REMOVAL N/A 05/08/2019   Procedure: REMOVAL OF PORT-A-CATH RIGHT CHEST;  Surgeon: Stark Klein, MD;  Location: [place];  Service: General;  Laterality: N/A;   PORTACATH PLACEMENT N/A 10/11/2018   Procedure: INSERTION PORT-A-CATH;  Surgeon: Stark Klein, MD;  Location: [place];  Service: General;  Laterality: N/A;   RE-EXCISION OF BREAST LUMPECTOMY Right 10/23/2018   Procedure: RE-EXCISION OF RIGHT BREAST LUMPECTOMY;  Surgeon: Stark Klein, MD;  Location: [place];  Service: General;  Laterality: Right;    Social History   Socioeconomic History   Marital status: Widowed    Spouse name: Not on file   Number of children: 3   Years of education: Not on file   Highest education level: Not on file  Occupational History   Occupation: retired Ambulance person: RETIRED  Tobacco Use   Smoking status: Never   Smokeless tobacco: Never  Substance and Sexual Activity   Alcohol use: No   Drug use: No   Sexual activity: Never  Other Topics Concern   Not on file  Social History Narrative   Not on file   Social Determinants of Health   Financial Resource Strain: Low Risk    Difficulty of Paying Living Expenses: Not hard at all  Food Insecurity: No Food Insecurity   Worried About Charity fundraiser in the Last Year: Never true   [place] in the Last Year: Never true  Transportation Needs: No Transportation Needs   Lack of Transportation (Medical): No   Lack of Transportation (Non-Medical): No  Physical Activity: Sufficiently Active   Days of Exercise per Week: 5 days    Minutes of Exercise per Session: 30 min  Stress: No Stress Concern Present   Feeling of Stress : Not at all  Social Connections: Moderately Integrated   Frequency of Communication with Friends and Family: More than three times a week   Frequency of Social Gatherings with Friends and Family: Once a week   Attends Religious Services: More than 4 times per year   Active Member of [employer] or Organizations: No   Attends Music therapist: More than 4 times per year   Marital Status: Never married  Human resources officer Violence: Not on file    Family History  Problem Relation Age of Onset   Arthritis Other    Coronary artery disease Other    Kidney disease Other    Heart disease Mother     ROS: no fevers or chills, productive cough, hemoptysis, dysphasia, odynophagia, melena, hematochezia, dysuria, hematuria, rash, seizure activity, orthopnea, PND, pedal edema, claudication. Remaining  systems are negative.  Physical Exam: Well-developed well-nourished in no acute distress.  Skin is warm and dry.  HEENT is normal.  Neck is supple.  Chest is clear to auscultation with normal expansion.  Cardiovascular exam is regular rate and rhythm.  Abdominal exam nontender or distended. No masses palpated. Extremities show no edema. neuro grossly intact  A/P  1 chronic systolic congestive heart failure-patient is euvolemic on examination.  We will continue Lasix and spironolactone at present dose.  Add Farxiga 10 mg daily.  Check potassium and renal function in 1 week.  2 nonischemic cardiomyopathy-continue Entresto, beta-blocker and spironolactone.  3 hypertension-blood pressure controlled.  Continue present medications.  4 hyperlipidemia-continue statin.  5 history of ICD-followed by electrophysiology.  6 PVCs-continue beta-blocker.  Seen by Dr. Lovena Le in the past and ablation not pursued.  Kirk Ruths, MD

## 2021-07-16 ENCOUNTER — Institutional Professional Consult (permissible substitution): Payer: Medicare Other | Admitting: Pulmonary Disease

## 2021-07-27 ENCOUNTER — Encounter: Payer: Self-pay | Admitting: Cardiology

## 2021-07-27 ENCOUNTER — Ambulatory Visit: Payer: Medicare Other | Admitting: Cardiology

## 2021-07-27 ENCOUNTER — Other Ambulatory Visit: Payer: Self-pay

## 2021-07-27 VITALS — BP 136/72 | HR 91 | Ht 67.0 in | Wt 211.2 lb

## 2021-07-27 DIAGNOSIS — I428 Other cardiomyopathies: Secondary | ICD-10-CM | POA: Diagnosis not present

## 2021-07-27 DIAGNOSIS — I5022 Chronic systolic (congestive) heart failure: Secondary | ICD-10-CM | POA: Diagnosis not present

## 2021-07-27 DIAGNOSIS — Z95810 Presence of automatic (implantable) cardiac defibrillator: Secondary | ICD-10-CM | POA: Diagnosis not present

## 2021-07-27 DIAGNOSIS — I10 Essential (primary) hypertension: Secondary | ICD-10-CM

## 2021-07-27 MED ORDER — DAPAGLIFLOZIN PROPANEDIOL 10 MG PO TABS: 10.0000 mg | ORAL_TABLET | Freq: Every day | ORAL | 11 refills | Status: DC

## 2021-07-27 NOTE — Patient Instructions (Signed)
Medication Instructions:   START FARXIGA 10 MG ONCE DAILY  *If you need a refill on your cardiac medications before your next appointment, please call your pharmacy*   Lab Work:  Your physician recommends that you return for lab work in: [place]  If you have labs (blood work) drawn today and your tests are completely normal, you will receive your results only by: [place] (if you have MyChart) OR A paper copy in the mail If you have any lab test that is abnormal or we need to change your treatment, we will call you to review the results.   Follow-Up: At [facility], you and your health needs are our priority.  As part of our continuing mission to provide you with exceptional heart care, we have created designated Provider Care Teams.  These Care Teams include your primary Cardiologist (physician) and Advanced Practice Providers (APPs -  Physician Assistants and Nurse Practitioners) who all work together to provide you with the care you need, when you need it.  We recommend signing up for the patient portal called "MyChart".  Sign up information is provided on this After Visit Summary.  MyChart is used to connect with patients for Virtual Visits (Telemedicine).  Patients are able to view lab/test results, encounter notes, upcoming appointments, etc.  Non-urgent messages can be sent to your provider as well.   To learn more about what you can do with MyChart, go to [email or web address].    Your next appointment:   6 month(s)  The format for your next appointment:   In Person  Provider:   Kirk Ruths MD

## 2021-07-28 ENCOUNTER — Other Ambulatory Visit: Payer: Self-pay | Admitting: Internal Medicine

## 2021-07-28 DIAGNOSIS — I5022 Chronic systolic (congestive) heart failure: Secondary | ICD-10-CM

## 2021-07-28 NOTE — Telephone Encounter (Signed)
Please refill as per office routine med refill policy (all routine meds to be refilled for 3 mo or monthly (per pt preference) up to one year from last visit, then month to month grace period for 3 mo, then further med refills will have to be denied) ? ?

## 2021-08-03 ENCOUNTER — Ambulatory Visit (INDEPENDENT_AMBULATORY_CARE_PROVIDER_SITE_OTHER): Payer: Medicare Other

## 2021-08-03 DIAGNOSIS — I428 Other cardiomyopathies: Secondary | ICD-10-CM

## 2021-08-04 LAB — CUP PACEART REMOTE DEVICE CHECK
Battery Remaining Longevity: 93 mo
Battery Voltage: 2.97 V
Brady Statistic RV Percent Paced: 0 %
Date Time Interrogation Session: 20230125014229
HighPow Impedance: 44 Ohm
HighPow Impedance: 57 Ohm
Implantable Lead Implant Date: 20081215
Implantable Lead Location: 753860
Implantable Lead Model: 6947
Implantable Pulse Generator Implant Date: 20190418
Lead Channel Impedance Value: 342 Ohm
Lead Channel Impedance Value: 456 Ohm
Lead Channel Pacing Threshold Amplitude: 1 V
Lead Channel Pacing Threshold Pulse Width: 0.4 ms
Lead Channel Sensing Intrinsic Amplitude: 31.625 mV
Lead Channel Sensing Intrinsic Amplitude: 31.625 mV
Lead Channel Setting Pacing Amplitude: 2.5 V
Lead Channel Setting Pacing Pulse Width: 0.4 ms
Lead Channel Setting Sensing Sensitivity: 0.3 mV

## 2021-08-10 ENCOUNTER — Other Ambulatory Visit: Payer: Self-pay

## 2021-08-10 ENCOUNTER — Ambulatory Visit: Payer: Medicare Other | Admitting: Pulmonary Disease

## 2021-08-10 ENCOUNTER — Encounter: Payer: Self-pay | Admitting: Pulmonary Disease

## 2021-08-10 VITALS — BP 126/70 | HR 82 | Temp 98.9°F | Ht 67.0 in | Wt 208.0 lb

## 2021-08-10 DIAGNOSIS — J4540 Moderate persistent asthma, uncomplicated: Secondary | ICD-10-CM

## 2021-08-10 DIAGNOSIS — R911 Solitary pulmonary nodule: Secondary | ICD-10-CM

## 2021-08-10 MED ORDER — STIOLTO RESPIMAT 2.5-2.5 MCG/ACT IN AERS: 2.0000 | INHALATION_SPRAY | Freq: Every day | RESPIRATORY_TRACT | 3 refills | Status: DC

## 2021-08-10 NOTE — Progress Notes (Signed)
@Patient  ID: [name] [name], female    DOB: 01/23/1941, 81 y.o.   MRN: [id number]  Chief Complaint  Patient presents with   Consult    Consult for SOB. Pt states that the SOB has been going on for a while. Pt states that her inhalers that she is currently on are working well for her     Referring provider: Deboraha Sprang, MD  HPI:   81 y.o. woman whom we are seeing in consultation for evaluation of dyspnea on exertion.  Most recent cardiology note x2 reviewed.  Most recent PCP note reviewed.  Overall, she is doing relatively well.  She endorses chronic dyspnea on exertion.  Worse on inclines or stairs.  No time of day when things are better or worse.  No seasonal or environmental factors make things better or worse.  No position to make things better or worse.  Has albuterol as needed and this does seem to help a bit.  Was on ICS/LABA inhaler but did not like the way it tasted.  Did not like rinsing out her mouth.  Maybe helped a little bit.  Reviewed most recent chest imaging CT chest high-resolution 04/2020 that on my review interpretation shows basilar right greater than left mild bronchiectasis with small nodule.  CPET 09/2020 reviewed that shows limitation of her body habitus, hypoventilation, chronotropic incompetence.  Spirometry prior to that CPET demonstrated normal spirometry.  Most recent echocardiogram 09/2018 reviewed demonstrating EF of 20%, dilated LA.  PMH: Cardiomyopathy, history of breast cancer in remission Surgical history: Hysterectomy, lumpectomy for breast cancer Family history: Mother with CAD Social history: Never smoker, lives in [place] / Pulmonary Flowsheets:   ACT:  No flowsheet data found.  MMRC: No flowsheet data found.  Epworth:  No flowsheet data found.  Tests:   FENO:  No results found for: NITRICOXIDE  PFT: No flowsheet data found.  WALK:  No flowsheet data found.  Imaging: Personally reviewed and as per EMR  discussion this note CUP PACEART REMOTE DEVICE CHECK  Result Date: 08/04/2021 Scheduled remote reviewed. Normal device function.  Next remote 91 days. LA   Lab Results: Personally reviewed CBC    Component Value Date/Time   WBC 4.7 10/29/2019 1144   RBC 4.25 10/29/2019 1144   HGB 11.2 (L) 10/29/2019 1144   HGB 10.1 (L) 02/28/2019 0941   HGB 10.8 (L) 10/11/2017 1252   HCT 35.2 (L) 10/29/2019 1144   HCT 33.9 (L) 10/11/2017 1252   PLT 199.0 10/29/2019 1144   PLT 290 02/28/2019 0941   PLT 226 10/11/2017 1252   MCV 82.8 10/29/2019 1144   MCV 79 10/11/2017 1252   MCH 26.3 05/03/2019 0826   MCHC 31.9 10/29/2019 1144   RDW 16.8 (H) 10/29/2019 1144   RDW 16.7 (H) 10/11/2017 1252   LYMPHSABS 1.8 10/29/2019 1144   MONOABS 0.4 10/29/2019 1144   EOSABS 0.1 10/29/2019 1144   BASOSABS 0.0 10/29/2019 1144    BMET    Component Value Date/Time   NA 141 04/03/2020 1635   K 4.6 04/03/2020 1635   CL 102 04/03/2020 1635   CO2 27 04/03/2020 1635   GLUCOSE 100 (H) 04/03/2020 1635   GLUCOSE 115 (H) 10/29/2019 1144   BUN 19 04/03/2020 1635   CREATININE 1.49 (H) 04/03/2020 1635   CREATININE 1.34 (H) 02/28/2019 0941   CREATININE 1.54 (H) 12/11/2015 1001   CALCIUM 9.5 04/03/2020 1635   GFRNONAA 34 (L) 04/03/2020 1635   GFRNONAA 38 (L)  02/28/2019 0941   GFRAA 39 (L) 04/03/2020 1635   GFRAA 44 (L) 02/28/2019 0941    BNP    Component Value Date/Time   BNP 366.8 (H) 05/19/2017 1235    ProBNP    Component Value Date/Time   PROBNP 55.2 08/20/2010 0935    Specialty Problems       Pulmonary Problems   Allergic rhinitis    Qualifier: Diagnosis of  By: Jenny Reichmann MD, Hunt Oris       Wheezing   Pulmonary nodule    Allergies  Allergen Reactions   Topamax [Topiramate] Other (See Comments)    Confusion, gets lost going home   Zocor [Simvastatin - High Dose] Nausea Only    Nausea     Immunization History  Administered Date(s) Administered   Fluad Quad(high Dose 65+) 05/31/2019    Influenza-Unspecified 03/18/2021   PFIZER(Purple Top)SARS-COV-2 Vaccination 08/28/2019, 09/18/2019, 04/24/2020, 04/03/2021   Pneumococcal Conjugate-13 02/07/2013   Pneumococcal Polysaccharide-23 05/01/2014   Tdap 05/22/2018    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   carvedilol (COREG) 25 MG tablet Take 0.5 tablets (12.5 mg total) by mouth 2 (two) times daily.   cholecalciferol (VITAMIN D3) 25 MCG (1000 UT) tablet Take 1,000 Units by mouth daily.   dapagliflozin propanediol (FARXIGA) 10 MG TABS tablet Take 1 tablet (10 mg total) by mouth daily before breakfast.   ENTRESTO 24-26 MG TAKE 1 TABLET BY MOUTH 2 (TWO) TIMES DAILY   furosemide (LASIX) 40 MG tablet TAKE 1 TABLET BY MOUTH  TWICE DAILY   ibuprofen (ADVIL) 800 MG tablet TAKE 1 TABLET (800 MG TOTAL) EVERY 8 (EIGHT) HOURS AS NEEDED BY MOUTH.   lidocaine-prilocaine (EMLA) cream APPLY TO AFFECTED AREA ONCE AS DIRECTED   meloxicam  (MOBIC) 15 MG tablet TAKE 1 TABLET BY MOUTH EVERY DAY AS NEEDED FOR PAIN   montelukast (SINGULAIR) 10 MG tablet TAKE 1 TABLET BY MOUTH EVERY DAY   Multiple Vitamin (MULTIVITAMIN WITH MINERALS) TABS tablet Take 1 tablet by mouth daily.   rizatriptan (MAXALT) 10 MG tablet Take 10 mg by mouth as needed for migraine. May repeat in 2 hours if needed   scopolamine (TRANSDERM-SCOP, 1.5 MG,) 1 MG/3DAYS Place 1 patch (1.5 mg total) onto the skin every 3 (three) days.   spironolactone (ALDACTONE) 25 MG tablet TAKE ONE-HALF TABLET BY MOUTH  DAILY   Tiotropium Bromide-Olodaterol (STIOLTO RESPIMAT) 2.5-2.5 MCG/ACT AERS Inhale 2 puffs into the lungs daily.   traMADol (ULTRAM) 50 MG tablet Take 1 tablet (50 mg total)  by mouth every 6 (six) hours as needed.   triamcinolone cream (KENALOG) 0.1 % Apply 1 application topically 2 (two) times daily.   [DISCONTINUED] azithromycin (ZITHROMAX) 250 MG tablet 2 tab by mouth day 1, then 1 per day (Patient not taking: Reported on 06/15/2021)   [DISCONTINUED] estradiol (ESTRACE) 1 MG tablet Take 1 mg by mouth daily. (Patient not taking: Reported on 06/15/2021)   [DISCONTINUED] prochlorperazine (COMPAZINE) 10 MG tablet TAKE 1 TABLET (10 MG TOTAL) BY MOUTH EVERY 6 (SIX) HOURS AS NEEDED (NAUSEA OR VOMITING).   No facility-administered encounter medications on file as of 08/10/2021.     Review of Systems  Review of Systems  No chest pain with exertion.  No orthopnea or PND.  No lower extremity swelling.  Comprehensive review of systems otherwise negative. Physical Exam  BP 126/70 (BP Location: Left Arm, Patient Position: Sitting, Cuff Size: Normal)    Pulse 82    Temp 98.9 F (37.2 C) (Oral)    Ht 5\' 7"  (1.702 m)    Wt 208 lb (94.3 kg)    SpO2 100%    BMI 32.58 kg/m   Wt Readings from Last 5 Encounters:  08/10/21 208 lb (94.3 kg)  07/27/21 211 lb 3.2 oz (95.8 kg)  06/15/21 218 lb 9.6 oz (99.2 kg)  05/31/21 210 lb 12.8 oz (95.6 kg)  05/18/21 210 lb 12.8 oz (95.6 kg)     BMI Readings from Last 5 Encounters:  08/10/21 32.58 kg/m  07/27/21 33.08 kg/m  06/15/21 34.24 kg/m  05/31/21 33.02 kg/m  05/18/21 33.02 kg/m     Physical Exam General: Well-appearing, no acute distress Eyes: EOMI, no icterus Neck: Supple, no JVP Pulmonary: Clear, normal work of breathing Cardiovascular: Regular rhythm, no murmur Abdomen: Nondistended, bowel sounds present MSK: No synovitis, no joint effusion Neuro: Normal gait, no weakness Psych: Normal mood, full affect   Assessment & Plan:   Dyspnea on exertion: Likely multifactorial and related to deconditioning, chronotropic/inotropic insufficiency in the setting of severe cardiomyopathy EF 20%, possibly uncontrolled asthma.  PFT with CPET 09/2020 with normal spirometry.  Albuterol helps some.  Recommend maintenance inhaler, see below.  Likely asthma: Prescribed ICS/LABA versus Advair in the past and did not like taste, did not like rinsing mouth.  Albuterol helps some.  Recommend trial of LABA/LAMA therapy to help with bronchodilation to see if it improves symptoms.  Avoid ICS therapy given her aversion to rinsing out mouth after every use.  Lung nodule: Tiny, less than 6 mm seen in 2021.  She does have a history of breast cancer so consider her high risk.  We will repeat CT scan to complete overdue 1 year follow-up per Fleischner criteria.   Return in about 3 months (around 11/07/2021).   Lanier Clam, MD 08/10/2021

## 2021-08-10 NOTE — Patient Instructions (Addendum)
Nice to meet you  Use Stiolto 2 puff once a day  Continue albuterol as needed  We will get a CT scan to make sure nodule in 2021 is stable  Return to clinic in 3 months or sooner as needed

## 2021-08-13 NOTE — Progress Notes (Signed)
Remote ICD transmission.   

## 2021-08-20 ENCOUNTER — Other Ambulatory Visit: Payer: Self-pay | Admitting: *Deleted

## 2021-08-22 ENCOUNTER — Other Ambulatory Visit: Payer: Self-pay | Admitting: Internal Medicine

## 2021-08-22 NOTE — Telephone Encounter (Signed)
Please refill as per office routine med refill policy (all routine meds to be refilled for 3 mo or monthly (per pt preference) up to one year from last visit, then month to month grace period for 3 mo, then further med refills will have to be denied) ? ?

## 2021-08-23 ENCOUNTER — Other Ambulatory Visit: Payer: Self-pay

## 2021-08-23 ENCOUNTER — Ambulatory Visit (INDEPENDENT_AMBULATORY_CARE_PROVIDER_SITE_OTHER)
Admission: RE | Admit: 2021-08-23 | Discharge: 2021-08-23 | Disposition: A | Discharge status: Home / Self Care | Payer: Medicare Other | Source: Ambulatory Visit | Attending: Pulmonary Disease | Admitting: Pulmonary Disease

## 2021-08-23 DIAGNOSIS — R911 Solitary pulmonary nodule: Secondary | ICD-10-CM

## 2021-08-23 DIAGNOSIS — I700 Atherosclerosis of aorta: Secondary | ICD-10-CM | POA: Diagnosis not present

## 2021-08-24 NOTE — Progress Notes (Signed)
CT chest without concern for nodule which is great news. No further follow up images needed for prior nodule seen in 2021.

## 2021-08-26 DIAGNOSIS — H524 Presbyopia: Secondary | ICD-10-CM | POA: Diagnosis not present

## 2021-08-26 DIAGNOSIS — H25813 Combined forms of age-related cataract, bilateral: Secondary | ICD-10-CM | POA: Diagnosis not present

## 2021-08-26 DIAGNOSIS — H5203 Hypermetropia, bilateral: Secondary | ICD-10-CM | POA: Diagnosis not present

## 2021-08-26 DIAGNOSIS — H52222 Regular astigmatism, left eye: Secondary | ICD-10-CM | POA: Diagnosis not present

## 2021-09-01 ENCOUNTER — Encounter: Payer: Self-pay | Admitting: *Deleted

## 2021-09-02 ENCOUNTER — Other Ambulatory Visit: Payer: Self-pay | Admitting: Internal Medicine

## 2021-09-06 ENCOUNTER — Other Ambulatory Visit: Payer: Self-pay | Admitting: Internal Medicine

## 2021-09-07 ENCOUNTER — Telehealth: Payer: Self-pay

## 2021-09-07 NOTE — Telephone Encounter (Signed)
Spoke with pt and advised pt monitor reviewed by Dr Caryl Comes and shows high volume PVC's > 10% and Dr Caryl Comes will discuss monitor results with Dr Stanford Breed.  Pt is to follow up with Dr Stanford Breed 01/2022.  Pt verbalizes understanding and agrees with current plan.

## 2021-09-27 ENCOUNTER — Encounter: Payer: Self-pay | Admitting: Adult Health

## 2021-09-27 DIAGNOSIS — Z1231 Encounter for screening mammogram for malignant neoplasm of breast: Secondary | ICD-10-CM | POA: Diagnosis not present

## 2021-09-27 DIAGNOSIS — I428 Other cardiomyopathies: Secondary | ICD-10-CM | POA: Diagnosis not present

## 2021-09-27 LAB — BASIC METABOLIC PANEL
BUN/Creatinine Ratio: 14 (ref 12–28)
BUN: 22 mg/dL (ref 8–27)
CO2: 24 mmol/L (ref 20–29)
Calcium: 9.5 mg/dL (ref 8.7–10.3)
Chloride: 104 mmol/L (ref 96–106)
Creatinine, Ser: 1.58 mg/dL — ABNORMAL HIGH (ref 0.57–1.00)
Glucose: 90 mg/dL (ref 70–99)
Potassium: 5.2 mmol/L (ref 3.5–5.2)
Sodium: 141 mmol/L (ref 134–144)
eGFR: 33 mL/min/{1.73_m2} — ABNORMAL LOW (ref >59)

## 2021-09-27 LAB — HM MAMMOGRAPHY

## 2021-10-06 ENCOUNTER — Other Ambulatory Visit: Payer: Self-pay | Admitting: Cardiology

## 2021-10-06 ENCOUNTER — Other Ambulatory Visit: Payer: Self-pay | Admitting: Internal Medicine

## 2021-10-06 DIAGNOSIS — I5022 Chronic systolic (congestive) heart failure: Secondary | ICD-10-CM

## 2021-10-06 NOTE — Telephone Encounter (Signed)
Please refill as per office routine med refill policy (all routine meds to be refilled for 3 mo or monthly (per pt preference) up to one year from last visit, then month to month grace period for 3 mo, then further med refills will have to be denied) ? ?

## 2021-10-13 ENCOUNTER — Other Ambulatory Visit: Payer: Self-pay | Admitting: Internal Medicine

## 2021-10-13 DIAGNOSIS — C50511 Malignant neoplasm of lower-outer quadrant of right female breast: Secondary | ICD-10-CM

## 2021-11-02 ENCOUNTER — Ambulatory Visit (INDEPENDENT_AMBULATORY_CARE_PROVIDER_SITE_OTHER): Payer: Medicare Other

## 2021-11-02 DIAGNOSIS — I428 Other cardiomyopathies: Secondary | ICD-10-CM

## 2021-11-02 DIAGNOSIS — I5022 Chronic systolic (congestive) heart failure: Secondary | ICD-10-CM

## 2021-11-02 LAB — CUP PACEART REMOTE DEVICE CHECK
Battery Remaining Longevity: 88 mo
Battery Voltage: 2.99 V
Brady Statistic RV Percent Paced: 0.01 %
Date Time Interrogation Session: 20230425061608
HighPow Impedance: 38 Ohm
HighPow Impedance: 48 Ohm
Implantable Lead Implant Date: 20081215
Implantable Lead Location: 753860
Implantable Lead Model: 6947
Implantable Pulse Generator Implant Date: 20190418
Lead Channel Impedance Value: 342 Ohm
Lead Channel Impedance Value: 399 Ohm
Lead Channel Pacing Threshold Amplitude: 0.875 V
Lead Channel Pacing Threshold Pulse Width: 0.4 ms
Lead Channel Sensing Intrinsic Amplitude: 15.5 mV
Lead Channel Sensing Intrinsic Amplitude: 15.5 mV
Lead Channel Setting Pacing Amplitude: 2.5 V
Lead Channel Setting Pacing Pulse Width: 0.4 ms
Lead Channel Setting Sensing Sensitivity: 0.3 mV

## 2021-11-10 ENCOUNTER — Other Ambulatory Visit: Payer: Self-pay | Admitting: Cardiology

## 2021-11-10 DIAGNOSIS — I428 Other cardiomyopathies: Secondary | ICD-10-CM

## 2021-11-15 ENCOUNTER — Ambulatory Visit: Payer: Medicare Other | Admitting: Pulmonary Disease

## 2021-11-15 ENCOUNTER — Encounter: Payer: Self-pay | Admitting: Pulmonary Disease

## 2021-11-15 VITALS — BP 114/64 | HR 69 | Temp 98.0°F | Ht 67.0 in | Wt 218.0 lb

## 2021-11-15 DIAGNOSIS — J4540 Moderate persistent asthma, uncomplicated: Secondary | ICD-10-CM | POA: Diagnosis not present

## 2021-11-15 DIAGNOSIS — R0609 Other forms of dyspnea: Secondary | ICD-10-CM

## 2021-11-15 MED ORDER — STIOLTO RESPIMAT 2.5-2.5 MCG/ACT IN AERS: 2.0000 | INHALATION_SPRAY | Freq: Every day | RESPIRATORY_TRACT | 11 refills | Status: DC

## 2021-11-15 NOTE — Progress Notes (Signed)
? ?'@Patient'$  ID: [name] [name], female    DOB: February 02, 1941, 81 y.o.   MRN: [id number] ? ?Chief Complaint  ?Patient presents with  ? Follow-up  ?  Pt is here for follow up. She states that she is doing better since last visit. Pt is currenlty on Stilito inhaler and Albuterol as needed. She states that she has hardly had to use the Albuterol.   ? ? ?Referring provider: ?[name] Borg, MD ? ?HPI:  ? ?81 y.o. woman whom we are seeing in follow up for evaluation of dyspnea on exertion.   ? ?Doing better. DOE better. Able to get up stairs without resting. Able to ambulate throughout residence without stopping. No albuterol use. All marked improvement. Stiolto she attributes improvement too. Unfortunately co-opay is very high.  Reviewed CT scan 08/2021 order given high risk of prior breast cancer to follow-up prior small nodule, this was negative for nodule on my review interpretation which is good news. ? ?HPI at initial visit: ?Overall, she is doing relatively well.  She endorses chronic dyspnea on exertion.  Worse on inclines or stairs.  No time of day when things are better or worse.  No seasonal or environmental factors make things better or worse.  No position to make things better or worse.  Has albuterol as needed and this does seem to help a bit.  Was on ICS/LABA inhaler but did not like the way it tasted.  Did not like rinsing out her mouth.  Maybe helped a little bit. ? ?Reviewed most recent chest imaging CT chest high-resolution 04/2020 that on my review interpretation shows basilar right greater than left mild bronchiectasis with small nodule.  CPET 09/2020 reviewed that shows limitation of her body habitus, hypoventilation, chronotropic incompetence.  Spirometry prior to that CPET demonstrated normal spirometry.  Most recent echocardiogram 09/2018 reviewed demonstrating EF of 20%, dilated LA. ? ?PMH: Cardiomyopathy, history of breast cancer in remission ?Surgical history: Hysterectomy, lumpectomy for breast  cancer ?Family history: Mother with CAD ?Social history: Never smoker, lives in [place] ? ? ?Questionaires / Pulmonary Flowsheets:  ? ?ACT:  ?   ? View : No data to display.  ?  ?  ?  ? ? ?MMRC: ?   ? View : No data to display.  ?  ?  ?  ? ? ?Epworth:  ?   ? View : No data to display.  ?  ?  ?  ? ? ?Tests:  ? ?FENO:  ?No results found for: NITRICOXIDE ? ?PFT: ?   ? View : No data to display.  ?  ?  ?  ? ? ?WALK:  ?   ? View : No data to display.  ?  ?  ?  ? ? ?Imaging: ?Personally reviewed and as per EMR discussion this note ?CUP PACEART REMOTE DEVICE CHECK ? ?Result Date: 11/02/2021 ?Scheduled remote reviewed. Normal device function.  1 NSVT, 6 beats Next remote 91 days. LA  ? ?Lab Results: ?Personally reviewed ?CBC ?   ?Component Value Date/Time  ? WBC 4.7 10/29/2019 1144  ? RBC 4.25 10/29/2019 1144  ? HGB 11.2 (L) 10/29/2019 1144  ? HGB 10.1 (L) 02/28/2019 0941  ? HGB 10.8 (L) 10/11/2017 1252  ? HCT 35.2 (L) 10/29/2019 1144  ? HCT 33.9 (L) 10/11/2017 1252  ? PLT 199.0 10/29/2019 1144  ? PLT 290 02/28/2019 0941  ? PLT 226 10/11/2017 1252  ? MCV 82.8 10/29/2019 1144  ? MCV 79 10/11/2017 1252  ?  MCH 26.3 05/03/2019 0826  ? MCHC 31.9 10/29/2019 1144  ? RDW 16.8 (H) 10/29/2019 1144  ? RDW 16.7 (H) 10/11/2017 1252  ? LYMPHSABS 1.8 10/29/2019 1144  ? MONOABS 0.4 10/29/2019 1144  ? EOSABS 0.1 10/29/2019 1144  ? BASOSABS 0.0 10/29/2019 1144  ? ? ?BMET ?   ?Component Value Date/Time  ? NA 141 09/27/2021 0912  ? K 5.2 09/27/2021 0912  ? CL 104 09/27/2021 0912  ? CO2 24 09/27/2021 0912  ? GLUCOSE 90 09/27/2021 0912  ? GLUCOSE 115 (H) 10/29/2019 1144  ? BUN 22 09/27/2021 0912  ? CREATININE 1.58 (H) 09/27/2021 0912  ? CREATININE 1.34 (H) 02/28/2019 0941  ? CREATININE 1.54 (H) 12/11/2015 1001  ? CALCIUM 9.5 09/27/2021 0912  ? GFRNONAA 34 (L) 04/03/2020 1635  ? GFRNONAA 38 (L) 02/28/2019 0941  ? GFRAA 39 (L) 04/03/2020 1635  ? GFRAA 44 (L) 02/28/2019 0941  ? ? ?BNP ?   ?Component Value Date/Time  ? BNP 366.8 (H) 05/19/2017  1235  ? ? ?ProBNP ?   ?Component Value Date/Time  ? PROBNP 55.2 08/20/2010 0935  ? ? ?Specialty Problems   ? ?  ? Pulmonary Problems  ? Allergic rhinitis  ?  Qualifier: Diagnosis of  ?By: Jenny Reichmann MD, Hunt Oris ? ?  ?  ? Wheezing  ? Pulmonary nodule  ? ? ?Allergies  ?Allergen Reactions  ? Topamax [Topiramate] Other (See Comments)  ?  Confusion, gets lost going home  ? Zocor [Simvastatin - High Dose] Nausea Only  ?  Nausea ?  ? ? ?Immunization History  ?Administered Date(s) Administered  ? Fluad Quad(high Dose 65+) 05/31/2019  ? Influenza-Unspecified 03/18/2021  ? PFIZER(Purple Top)SARS-COV-2 Vaccination 08/28/2019, 09/18/2019, 04/24/2020, 04/03/2021  ? Pneumococcal Conjugate-13 02/07/2013  ? Pneumococcal Polysaccharide-23 05/01/2014  ? Tdap 05/22/2018  ? ? ?Past Medical History:  ?Diagnosis Date  ? AICD (automatic cardioverter/defibrillator) present   ? ALLERGIC RHINITIS 11/06/2007  ? ANEMIA-IRON DEFICIENCY 05/23/2007  ? ANEMIA-NOS 06/17/2008  ? ANXIETY 02/22/2008  ? CARDIOMYOPATHY, PRIMARY, DILATED 12/30/2008  ? 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 GERD 05/07/2007  ? History of kidney stones   ? HYPERLIPIDEMIA 05/23/2007  ? HYPERTENSION 05/07/2007  ? KELOID SCAR, 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  ? MENOPAUSAL DISORDER 05/06/2008  ? 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 OVERACTIVE BLADDER 08/03/2010  ? PYELONEPHRITIS, HX OF 05/07/2007  ? SYSTOLIC HEART FAILURE, CHRONIC 12/28/2008  ? VITAMIN D DEFICIENCY 06/17/2008  ? ? ?Tobacco History: ?Social History  ? ?Tobacco Use  ?Smoking Status Never  ?Smokeless Tobacco Never  ? ?Counseling given: Not Answered ? ? ?Continue to not smoke ? ?Outpatient Encounter Medications as of 11/15/2021  ?Medication Sig  ? albuterol (PROVENTIL HFA;VENTOLIN HFA) 108 (90 Base) MCG/ACT inhaler Inhale 1 puff into the lungs every 6 (six) hours as needed for wheezing or shortness of breath.  ? carvedilol (COREG) 25 MG tablet TAKE ONE-HALF TABLET BY  MOUTH TWICE DAILY  ? cholecalciferol (VITAMIN D3) 25  MCG (1000 UT) tablet Take 1,000 Units by mouth daily.  ? dapagliflozin propanediol (FARXIGA) 10 MG TABS tablet Take 1 tablet (10 mg total) by mouth daily before breakfast.  ? ENTRESTO 24-26 MG TAKE 1 TABLET BY MOUTH TWICE A DAY  ? furosemide (LASIX) 40 MG tablet TAKE 1 TABLET BY MOUTH TWICE  DAILY  ? ibuprofen (ADVIL) 800 MG tablet TAKE 1 TABLET (800 MG TOTAL) EVERY 8 (EIGHT) HOURS AS NEEDED BY MOUTH.  ? lidocaine-prilocaine (EMLA) cream APPLY TO AFFECTED AREA ONCE AS  DIRECTED  ? meloxicam (MOBIC) 15 MG tablet TAKE 1 TABLET BY MOUTH EVERY DAY AS NEEDED FOR PAIN  ? montelukast (SINGULAIR) 10 MG tablet TAKE 1 TABLET BY MOUTH EVERY DAY  ? Multiple Vitamin (MULTIVITAMIN WITH MINERALS) TABS tablet Take 1 tablet by mouth daily.  ? rizatriptan (MAXALT) 10 MG tablet Take 10 mg by mouth as needed for migraine. May repeat in 2 hours if needed  ? scopolamine (TRANSDERM-SCOP, 1.5 MG,) 1 MG/3DAYS Place 1 patch (1.5 mg total) onto the skin every 3 (three) days.  ? spironolactone (ALDACTONE) 25 MG tablet TAKE ONE-HALF TABLET BY MOUTH  DAILY  ? traMADol (ULTRAM) 50 MG tablet Take 1 tablet (50 mg total) by mouth every 6 (six) hours as needed.  ? triamcinolone cream (KENALOG) 0.1 % APPLY TO AFFECTED AREA TWICE A DAY  ? [DISCONTINUED] Tiotropium Bromide-Olodaterol (STIOLTO RESPIMAT) 2.5-2.5 MCG/ACT AERS Inhale 2 puffs into the lungs daily.  ? Tiotropium Bromide-Olodaterol (STIOLTO RESPIMAT) 2.5-2.5 MCG/ACT AERS Inhale 2 puffs into the lungs daily.  ? [DISCONTINUED] prochlorperazine (COMPAZINE) 10 MG tablet TAKE 1 TABLET (10 MG TOTAL) BY MOUTH EVERY 6 (SIX) HOURS AS NEEDED (NAUSEA OR VOMITING).  ? ?No facility-administered encounter medications on file as of 11/15/2021.  ? ? ? ?Review of Systems ? ?Review of Systems  ?N/a ?Physical Exam ? ?BP 114/64 (BP Location: Left Arm, Patient Position: Sitting, Cuff Size: Normal)   Pulse 69   Temp 98 ?F (36.7 ?C) (Oral)   Ht '5\' 7"'$  (1.702 m)   Wt 218 lb (98.9 kg)   SpO2 98%   BMI 34.14 kg/m?   ? ?Wt Readings from Last 5 Encounters:  ?11/15/21 218 lb (98.9 kg)  ?08/10/21 208 lb (94.3 kg)  ?07/27/21 211 lb 3.2 oz (95.8 kg)  ?06/15/21 218 lb 9.6 oz (99.2 kg)  ?05/31/21 210 lb 12.8 oz (95.6 kg)  ? ? ?BMI Read

## 2021-11-15 NOTE — Patient Instructions (Signed)
Nice to see you ? ?I refilled stiolto and provided samples ? ?Paperwork today to see if can get cheaper through the drug company ? ?Call me if cost is still an issue ? ?Return to clinic in 6 months or sooner if needed ?

## 2021-11-18 NOTE — Progress Notes (Signed)
Remote ICD transmission.   

## 2021-11-21 ENCOUNTER — Other Ambulatory Visit: Payer: Self-pay | Admitting: Cardiology

## 2021-11-26 ENCOUNTER — Ambulatory Visit: Payer: Medicare Other

## 2021-11-29 ENCOUNTER — Other Ambulatory Visit: Payer: Self-pay | Admitting: Internal Medicine

## 2021-11-29 NOTE — Telephone Encounter (Signed)
Please refill as per office routine med refill policy (all routine meds to be refilled for 3 mo or monthly (per pt preference) up to one year from last visit, then month to month grace period for 3 mo, then further med refills will have to be denied) ? ?

## 2021-12-01 ENCOUNTER — Ambulatory Visit (INDEPENDENT_AMBULATORY_CARE_PROVIDER_SITE_OTHER): Payer: Medicare Other | Admitting: Internal Medicine

## 2021-12-01 ENCOUNTER — Ambulatory Visit: Payer: Medicare Other

## 2021-12-01 ENCOUNTER — Ambulatory Visit (INDEPENDENT_AMBULATORY_CARE_PROVIDER_SITE_OTHER): Payer: Medicare Other

## 2021-12-01 VITALS — BP 120/68 | HR 84 | Temp 98.4°F | Ht 67.0 in | Wt 209.0 lb

## 2021-12-01 DIAGNOSIS — R051 Acute cough: Secondary | ICD-10-CM

## 2021-12-01 DIAGNOSIS — E7800 Pure hypercholesterolemia, unspecified: Secondary | ICD-10-CM | POA: Diagnosis not present

## 2021-12-01 DIAGNOSIS — E538 Deficiency of other specified B group vitamins: Secondary | ICD-10-CM

## 2021-12-01 DIAGNOSIS — N1831 Chronic kidney disease, stage 3a: Secondary | ICD-10-CM

## 2021-12-01 DIAGNOSIS — R739 Hyperglycemia, unspecified: Secondary | ICD-10-CM

## 2021-12-01 DIAGNOSIS — I10 Essential (primary) hypertension: Secondary | ICD-10-CM | POA: Diagnosis not present

## 2021-12-01 DIAGNOSIS — Z1159 Encounter for screening for other viral diseases: Secondary | ICD-10-CM | POA: Diagnosis not present

## 2021-12-01 DIAGNOSIS — E559 Vitamin D deficiency, unspecified: Secondary | ICD-10-CM | POA: Diagnosis not present

## 2021-12-01 DIAGNOSIS — Z0001 Encounter for general adult medical examination with abnormal findings: Secondary | ICD-10-CM | POA: Diagnosis not present

## 2021-12-01 DIAGNOSIS — N183 Chronic kidney disease, stage 3 unspecified: Secondary | ICD-10-CM

## 2021-12-01 DIAGNOSIS — J811 Chronic pulmonary edema: Secondary | ICD-10-CM | POA: Diagnosis not present

## 2021-12-01 DIAGNOSIS — R059 Cough, unspecified: Secondary | ICD-10-CM | POA: Diagnosis not present

## 2021-12-01 LAB — HEPATIC FUNCTION PANEL
ALT: 17 U/L (ref 0–35)
AST: 18 U/L (ref 0–37)
Albumin: 4.4 g/dL (ref 3.5–5.2)
Alkaline Phosphatase: 77 U/L (ref 39–117)
Bilirubin, Direct: 0.1 mg/dL (ref 0.0–0.3)
Total Bilirubin: 0.4 mg/dL (ref 0.2–1.2)
Total Protein: 7.8 g/dL (ref 6.0–8.3)

## 2021-12-01 LAB — TSH: TSH: 0.8 u[IU]/mL (ref 0.35–5.50)

## 2021-12-01 LAB — MICROALBUMIN / CREATININE URINE RATIO
Creatinine,U: 159.6 mg/dL
Microalb Creat Ratio: 1.4 mg/g (ref 0.0–30.0)
Microalb, Ur: 2.3 mg/dL — ABNORMAL HIGH (ref 0.0–1.9)

## 2021-12-01 LAB — CBC WITH DIFFERENTIAL/PLATELET
Basophils Absolute: 0 10*3/uL (ref 0.0–0.1)
Basophils Relative: 0.4 % (ref 0.0–3.0)
Eosinophils Absolute: 0.3 10*3/uL (ref 0.0–0.7)
Eosinophils Relative: 4 % (ref 0.0–5.0)
HCT: 37.4 % (ref 36.0–46.0)
Hemoglobin: 11.8 g/dL — ABNORMAL LOW (ref 12.0–15.0)
Lymphocytes Relative: 48 % — ABNORMAL HIGH (ref 12.0–46.0)
Lymphs Abs: 3.2 10*3/uL (ref 0.7–4.0)
MCHC: 31.5 g/dL (ref 30.0–36.0)
MCV: 81.2 fl (ref 78.0–100.0)
Monocytes Absolute: 0.5 10*3/uL (ref 0.1–1.0)
Monocytes Relative: 8 % (ref 3.0–12.0)
Neutro Abs: 2.6 10*3/uL (ref 1.4–7.7)
Neutrophils Relative %: 39.6 % — ABNORMAL LOW (ref 43.0–77.0)
Platelets: 252 10*3/uL (ref 150.0–400.0)
RBC: 4.6 Mil/uL (ref 3.87–5.11)
RDW: 16.7 % — ABNORMAL HIGH (ref 11.5–15.5)
WBC: 6.6 10*3/uL (ref 4.0–10.5)

## 2021-12-01 LAB — BASIC METABOLIC PANEL
BUN: 23 mg/dL (ref 6–23)
CO2: 27 mEq/L (ref 19–32)
Calcium: 9.7 mg/dL (ref 8.4–10.5)
Chloride: 101 mEq/L (ref 96–112)
Creatinine, Ser: 1.56 mg/dL — ABNORMAL HIGH (ref 0.40–1.20)
GFR: 31.53 mL/min — ABNORMAL LOW (ref >60.00)
Glucose, Bld: 102 mg/dL — ABNORMAL HIGH (ref 70–99)
Potassium: 4.2 mEq/L (ref 3.5–5.1)
Sodium: 137 mEq/L (ref 135–145)

## 2021-12-01 LAB — HEMOGLOBIN A1C: Hgb A1c MFr Bld: 6.1 % (ref 4.6–6.5)

## 2021-12-01 LAB — VITAMIN B12: Vitamin B-12: 459 pg/mL (ref 211–911)

## 2021-12-01 LAB — LIPID PANEL
Cholesterol: 187 mg/dL (ref 0–200)
HDL: 42.9 mg/dL (ref >39.00)
LDL Cholesterol: 123 mg/dL — ABNORMAL HIGH (ref 0–99)
NonHDL: 143.95
Total CHOL/HDL Ratio: 4
Triglycerides: 105 mg/dL (ref 0.0–149.0)
VLDL: 21 mg/dL (ref 0.0–40.0)

## 2021-12-01 LAB — VITAMIN D 25 HYDROXY (VIT D DEFICIENCY, FRACTURES): VITD: 28.99 ng/mL — ABNORMAL LOW (ref 30.00–100.00)

## 2021-12-01 MED ORDER — BENZONATATE 100 MG PO CAPS: ORAL_CAPSULE | ORAL | 2 refills | Status: DC

## 2021-12-01 MED ORDER — LEVOFLOXACIN 500 MG PO TABS: 500.0000 mg | ORAL_TABLET | Freq: Every day | ORAL | 0 refills | Status: AC

## 2021-12-01 MED ORDER — TRAMADOL HCL 50 MG PO TABS: 50.0000 mg | ORAL_TABLET | Freq: Four times a day (QID) | ORAL | 2 refills | Status: DC | PRN

## 2021-12-01 NOTE — Patient Instructions (Addendum)
Please take all new medication as prescribed --the antibiotic, cough medicine, and pain medication as needed as well  Please continue all other medications as before, and refills have been done if requested.  Please have the pharmacy call with any other refills you may need.  Please continue your efforts at being more active, low cholesterol diet, and weight control.  You are otherwise up to date with prevention measures today.  Please keep your appointments with your specialists as you may have planned  Please go to the XRAY Department in the first floor for the x-ray testing  Please go to the LAB at the blood drawing area for the tests to be done  You will be contacted by phone if any changes need to be made immediately.  Otherwise, you will receive a letter about your results with an explanation, but please check with MyChart first.  Please remember to sign up for MyChart if you have not done so, as this will be important to you in the future with finding out test results, communicating by private email, and scheduling acute appointments online when needed.  Please make an Appointment to return in 6 months, or sooner if needed

## 2021-12-01 NOTE — Addendum Note (Signed)
Addended by: Boris Lown B on: 12/01/2021 03:07 PM   Modules accepted: Orders

## 2021-12-01 NOTE — Progress Notes (Signed)
Patient ID: [name] [name], female   DOB: [birth year], 81 y.o.   MRN: [id number]         Chief Complaint:: wellness exam and Follow-up and Possible infection (Cough with drainage, mucous and blood. Soreness in rib cage from coughing)  , hld, low vit d       HPI:  CAROLIN [name] is a 81 y.o. female here for wellness exam; decliens covid booster, shingrix ow up to date                        Also has c/o acute osnet 2 wks cough left max sinus pain, bilateral lower costal margin pain sharp moderate with cough.  Also with headache, general weakness and malaise, but pt denies other chest pain, wheezing, increased sob or doe, orthopnea, PND, increased LE swelling, palpitations, dizziness or syncope. Not taking Vit d.  Does not want to start statin if possible.  Has yearly renal appt in Sept 2023.  Tested herself covid neg at home yesterday.   Pt denies polydipsia, polyuria, or new focal neuro s/s.    Pt denies recent wt loss, night sweats, loss of appetite, or other constitutional symptoms  Wt Readings from Last 3 Encounters:  12/01/21 209 lb (94.8 kg)  11/15/21 218 lb (98.9 kg)  08/10/21 208 lb (94.3 kg)   BP Readings from Last 3 Encounters:  12/01/21 120/68  11/15/21 114/64  08/10/21 126/70   Immunization History  Administered Date(s) Administered   Fluad Quad(high Dose 65+) 05/31/2019   Influenza-Unspecified 03/18/2021   PFIZER(Purple Top)SARS-COV-2 Vaccination 08/28/2019, 09/18/2019, 04/24/2020, 04/03/2021   Pneumococcal Conjugate-13 02/07/2013   Pneumococcal Polysaccharide-23 05/01/2014   Tdap 05/22/2018  There are no preventive care reminders to display for this patient.    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   OVERACTIVE BLADDER 08/03/2010   PYELONEPHRITIS, HX OF 77/93/9030   SYSTOLIC HEART FAILURE, CHRONIC 12/28/2008   VITAMIN D DEFICIENCY 06/17/2008   Past Surgical History:  Procedure Laterality Date   ABDOMINAL HYSTERECTOMY     BREAST LUMPECTOMY WITH RADIOACTIVE SEED AND SENTINEL LYMPH NODE BIOPSY Right 10/11/2018   Procedure: RIGHT BREAST LUMPECTOMY WITH RADIOACTIVE SEED AND SENTINEL LYMPH NODE BIOPSY;  Surgeon: Stark Klein, MD;  Location: [place];  Service: General;  Laterality: Right;   ICD GENERATOR CHANGEOUT N/A 10/25/2017   Procedure: ICD GENERATOR CHANGEOUT;  Surgeon: Deboraha Sprang, MD;  Location: [place] [facility];  Service: Cardiovascular;  Laterality: N/A;   LAMINECTOMY     PORT-A-CATH REMOVAL N/A 05/08/2019   Procedure: REMOVAL OF PORT-A-CATH RIGHT CHEST;  Surgeon: Stark Klein, MD;  Location: [place];  Service: General;  Laterality: N/A;   PORTACATH PLACEMENT N/A 10/11/2018   Procedure: INSERTION PORT-A-CATH;  Surgeon: Stark Klein, MD;  Location: [place];  Service: General;  Laterality: N/A;   RE-EXCISION OF BREAST LUMPECTOMY Right 10/23/2018   Procedure: RE-EXCISION OF RIGHT BREAST LUMPECTOMY;  Surgeon: Stark Klein, MD;  Location: [place];  Service: General;  Laterality: Right;    reports that she  has never smoked. She has never used smokeless tobacco. She reports that she does not drink alcohol and does not use drugs. family history includes Arthritis in an other family member; Coronary artery disease in an other family member; Heart disease in her mother; Kidney disease in an other family member. Allergies  Allergen Reactions   Topamax [Topiramate] Other (See Comments)    Confusion, gets lost going home   Zocor [Simvastatin - High Dose] Nausea Only    Nausea    Current  Outpatient Medications on File Prior to Visit  Medication Sig Dispense Refill   albuterol (PROVENTIL HFA;VENTOLIN HFA) 108 (90 Base) MCG/ACT inhaler Inhale 1 puff into the lungs every 6 (six) hours as needed for wheezing or shortness of breath.     carvedilol (COREG) 25 MG tablet TAKE ONE-HALF TABLET BY MOUTH  TWICE DAILY 90 tablet 3   cholecalciferol (VITAMIN D3) 25 MCG (1000 UT) tablet Take 1,000 Units by mouth daily.     dapagliflozin propanediol (FARXIGA) 10 MG TABS tablet Take 1 tablet (10 mg total) by mouth daily before breakfast. 30 tablet 11   ENTRESTO 24-26 MG TAKE 1 TABLET BY MOUTH TWICE A DAY 60 tablet 5   furosemide (LASIX) 40 MG tablet TAKE 1 TABLET BY MOUTH TWICE  DAILY 180 tablet 3   ibuprofen (ADVIL) 800 MG tablet TAKE 1 TABLET (800 MG TOTAL) EVERY 8 (EIGHT) HOURS AS NEEDED BY MOUTH. 40 tablet 2   lidocaine-prilocaine (EMLA) cream APPLY TO AFFECTED AREA ONCE AS DIRECTED 30 g 3   meloxicam (MOBIC) 15 MG tablet TAKE 1 TABLET BY MOUTH EVERY DAY AS NEEDED FOR PAIN 90 tablet 3   montelukast (SINGULAIR) 10 MG tablet Take 1 tablet (10 mg total) by mouth daily. Overdue for Annual appt must see provider for future refills 30 tablet 0   Multiple Vitamin (MULTIVITAMIN WITH MINERALS) TABS tablet Take 1 tablet by mouth daily.     rizatriptan (MAXALT) 10 MG tablet Take 10 mg by mouth as needed for migraine. May repeat in 2 hours if needed     scopolamine (TRANSDERM-SCOP, 1.5 MG,) 1 MG/3DAYS Place 1 patch (1.5 mg total) onto the skin every 3 (three) days. 10 patch 0   spironolactone (ALDACTONE) 25 MG tablet TAKE ONE-HALF TABLET BY MOUTH  DAILY 45 tablet 3   Tiotropium Bromide-Olodaterol (STIOLTO RESPIMAT) 2.5-2.5 MCG/ACT AERS Inhale 2 puffs into the lungs daily. 1 each 11   triamcinolone cream (KENALOG) 0.1 % APPLY TO AFFECTED AREA TWICE A DAY 30 g 0   [DISCONTINUED] prochlorperazine (COMPAZINE) 10 MG tablet TAKE 1 TABLET (10 MG TOTAL) BY MOUTH EVERY 6 (SIX) HOURS AS NEEDED (NAUSEA OR VOMITING).  30 tablet 1   No current facility-administered medications on file prior to visit.        ROS:  All others reviewed and negative.  Objective        PE:  BP 120/68 (BP Location: Left Arm, Patient Position: Sitting, Cuff Size: Large)   Pulse 84   Temp 98.4 F (36.9 C) (Oral)   Ht '5\' 7"'$  (1.702 m)   Wt 209 lb (94.8 kg)   SpO2 95%   BMI 32.73 kg/m                 Constitutional: Pt appears in NAD               HENT: Head: NCAT.                Right Ear: External  ear normal.                 Left Ear: External ear normal.  Bilat tm's with mild erythema.  Max sinus areas mild tender.  Pharynx with mild erythema, no exudate               Eyes: . Pupils are equal, round, and reactive to light. Conjunctivae and EOM are normal               Nose: without d/c or deformity               Neck: Neck supple. Gross normal ROM               Cardiovascular: Normal rate and regular rhythm.                 Pulmonary/Chest: Effort normal and breath sounds without rales or wheezing.                Abd:  Soft, NT, ND, + BS, no organomegaly with tender lower anterior costal margin bilateral               Neurological: Pt is alert. At baseline orientation, motor grossly intact               Skin: Skin is warm. No rashes, no other new lesions, LE edema - none               Psychiatric: Pt behavior is normal without agitation   Micro: none  Cardiac tracings I have personally interpreted today:  none  Pertinent Radiological findings (summarize): none   Lab Results  Component Value Date   WBC 6.6 12/01/2021   HGB 11.8 (L) 12/01/2021   HCT 37.4 12/01/2021   PLT 252.0 12/01/2021   GLUCOSE 102 (H) 12/01/2021   CHOL 187 12/01/2021   TRIG 105.0 12/01/2021   HDL 42.90 12/01/2021   LDLDIRECT 129.7 02/07/2013   LDLCALC 123 (H) 12/01/2021   ALT 17 12/01/2021   AST 18 12/01/2021   NA 137 12/01/2021   K 4.2 12/01/2021   CL 101 12/01/2021   CREATININE 1.56 (H) 12/01/2021   BUN 23 12/01/2021   CO2 27  12/01/2021   TSH 0.80 12/01/2021   INR 0.9 RATIO 06/18/2007   HGBA1C 6.1 12/01/2021   MICROALBUR 2.3 (H) 12/01/2021   Assessment/Plan:  JESSABELLE MARKIEWICZ is a 81 y.o. Black or African American [2] female with  has a past medical history of AICD (automatic cardioverter/defibrillator) present, ALLERGIC RHINITIS (11/06/2007), ANEMIA-IRON DEFICIENCY (05/23/2007), ANEMIA-NOS (06/17/2008), ANXIETY (02/22/2008), CARDIOMYOPATHY, PRIMARY, DILATED (12/30/2008), DEGENERATIVE DISC DISEASE, LUMBAR SPINE (05/07/2007), GERD (05/07/2007), History of kidney stones, HYPERLIPIDEMIA (05/23/2007), HYPERTENSION (05/07/2007), KELOID SCAR, HX OF (02/02/2009), MENOPAUSAL DISORDER (05/06/2008), OSTEOARTHRITIS, KNEES, BILATERAL (05/23/2007), OVERACTIVE BLADDER (08/03/2010), PYELONEPHRITIS, HX OF (26/94/8546), SYSTOLIC HEART FAILURE, CHRONIC (12/28/2008), and VITAMIN D DEFICIENCY (06/17/2008).  Vitamin D deficiency Last vitamin D Lab Results  Component Value Date   VD25OH 29.18 (L) 10/29/2019   Low, to start oral replacement   Encounter for well adult exam with abnormal findings Age and sex appropriate education and counseling updated with regular exercise and diet Referrals for preventative services - none needed Immunizations addressed - decliens covid booster, shingrix Smoking counseling  - none needed Evidence for depression or other mood disorder - none significant Most recent labs reviewed. I have personally reviewed and have noted: 1) the patient's medical and social history 2) The patient's current medications and supplements 3) The patient's  height, weight, and BMI have been recorded in the chart   Hyperlipidemia Lab Results  Component Value Date   LDLCALC 123 (H) 12/01/2021   Mild uncontrolled goal ldl< 70, pt to continue current low chol diet, declines statin   Hyperglycemia Lab Results  Component Value Date   HGBA1C 6.1 12/01/2021   Stable, pt to continue current medical treatment  farxiga   Essential hypertension BP Readings from Last 3 Encounters:  12/01/21 120/68  11/15/21 114/64  08/10/21 126/70   Stable, pt to continue medical treatment coreg entresto, aldactone   CKD (chronic kidney disease) stage 3, GFR 30-59 ml/min Lab Results  Component Value Date   CREATININE 1.56 (H) 12/01/2021   Stable overall, cont to avoid nephrotoxins   B12 deficiency Lab Results  Component Value Date   VITAMINB12 459 12/01/2021   Stable, cont oral replacement - b12 1000 mcg qd   Acute cough C/w uri - for antibx, cough med, pain control, and cxr -  to f/u any worsening symptoms or concerns  Followup: Return in about 6 months (around 06/03/2022).  Cathlean Cower, MD 12/04/2021 3:08 PM [facility]

## 2021-12-01 NOTE — Assessment & Plan Note (Signed)
Last vitamin D Lab Results  Component Value Date   VD25OH 29.18 (L) 10/29/2019   Low, to start oral replacement

## 2021-12-02 ENCOUNTER — Other Ambulatory Visit: Payer: Self-pay | Admitting: Internal Medicine

## 2021-12-02 LAB — URINALYSIS, ROUTINE W REFLEX MICROSCOPIC
Bilirubin Urine: NEGATIVE
Hgb urine dipstick: NEGATIVE
Ketones, ur: NEGATIVE
Leukocytes,Ua: NEGATIVE
Nitrite: NEGATIVE
RBC / HPF: NONE SEEN (ref 0–?)
Specific Gravity, Urine: 1.015 (ref 1.000–1.030)
Urine Glucose: >=1000 — AB
Urobilinogen, UA: 0.2 (ref 0.0–1.0)
pH: 6 (ref 5.0–8.0)

## 2021-12-02 LAB — HEPATITIS C ANTIBODY
Hepatitis C Ab: NONREACTIVE
SIGNAL TO CUT-OFF: 0.08 (ref <1.00)

## 2021-12-02 MED ORDER — ROSUVASTATIN CALCIUM 20 MG PO TABS: 20.0000 mg | ORAL_TABLET | Freq: Every day | ORAL | 3 refills | Status: DC

## 2021-12-04 ENCOUNTER — Encounter: Payer: Self-pay | Admitting: Internal Medicine

## 2021-12-04 DIAGNOSIS — R051 Acute cough: Secondary | ICD-10-CM | POA: Insufficient documentation

## 2021-12-04 NOTE — Assessment & Plan Note (Signed)
Age and sex appropriate education and counseling updated with regular exercise and diet Referrals for preventative services - none needed Immunizations addressed - decliens covid booster, shingrix Smoking counseling  - none needed Evidence for depression or other mood disorder - none significant Most recent labs reviewed. I have personally reviewed and have noted: 1) the patient's medical and social history 2) The patient's current medications and supplements 3) The patient's height, weight, and BMI have been recorded in the chart  

## 2021-12-04 NOTE — Assessment & Plan Note (Signed)
Lab Results  Component Value Date   [id number] 047 12/01/2021   Stable, cont oral replacement - b12 1000 mcg qd

## 2021-12-04 NOTE — Assessment & Plan Note (Signed)
Lab Results  Component Value Date   CREATININE 1.56 (H) 12/01/2021   Stable overall, cont to avoid nephrotoxins  

## 2021-12-04 NOTE — Assessment & Plan Note (Signed)
C/w uri - for antibx, cough med, pain control, and cxr -  to f/u any worsening symptoms or concerns

## 2021-12-04 NOTE — Assessment & Plan Note (Signed)
BP Readings from Last 3 Encounters:  12/01/21 120/68  11/15/21 114/64  08/10/21 126/70   Stable, pt to continue medical treatment coreg entresto, aldactone

## 2021-12-04 NOTE — Assessment & Plan Note (Signed)
Lab Results  Component Value Date   LDLCALC 123 (H) 12/01/2021   Mild uncontrolled goal ldl< 70, pt to continue current low chol diet, declines statin

## 2021-12-04 NOTE — Assessment & Plan Note (Signed)
Lab Results  Component Value Date   HGBA1C 6.1 12/01/2021   Stable, pt to continue current medical treatment farxiga

## 2021-12-13 DIAGNOSIS — C50511 Malignant neoplasm of lower-outer quadrant of right female breast: Secondary | ICD-10-CM | POA: Diagnosis not present

## 2021-12-13 DIAGNOSIS — Z171 Estrogen receptor negative status [ER-]: Secondary | ICD-10-CM | POA: Diagnosis not present

## 2022-01-01 ENCOUNTER — Other Ambulatory Visit: Payer: Self-pay | Admitting: Internal Medicine

## 2022-01-19 NOTE — Progress Notes (Signed)
HPI: FU nonischemic cardiomyopathy. Cardiac catheterization August 2004 showed normal coronary arteries. 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 Patient was referred to Dr. Lovena Le by Dr. Caryl Comes for consideration of ablation but Dr. Lovena Le felt medical therapy indicated. Nuclear study repeated April 2018 and showed ejection fraction 35% with no ischemia or infarction. Last echocardiogram March 2020 showed ejection fraction 20 to 25%, severe left ventricular enlargement, grade 2 diastolic dysfunction, mild left atrial enlargement.  CPX March 2022 showed low normal functional capacity with primary limitation associated with body habitus and patient hyperventilating at peak exercise.  Monitor 12/22 showed sinus rhythm with nonsustained ventricular tachycardia longest 7 beats, brief PAT longest lasting 12.7 seconds and frequent PVCs (7.3%). Since I last saw her, she denies increased dyspnea, chest pain, palpitations or syncope.  Current Outpatient Medications  Medication Sig Dispense Refill   albuterol (PROVENTIL HFA;VENTOLIN HFA) 108 (90 Base) MCG/ACT inhaler Inhale 1 puff into the lungs every 6 (six) hours as needed for wheezing or shortness of breath.     benzonatate (TESSALON PERLES) 100 MG capsule 1-2 tab by mouth evry 8 hr as needed 40 capsule 2   carvedilol (COREG) 25 MG tablet TAKE ONE-HALF TABLET BY MOUTH  TWICE DAILY 90 tablet 3   cholecalciferol (VITAMIN D3) 25 MCG (1000 UT) tablet Take 1,000 Units by mouth daily.     dapagliflozin propanediol (FARXIGA) 10 MG TABS tablet Take 1 tablet (10 mg total) by mouth daily before breakfast. 30 tablet 11   ENTRESTO 24-26 MG TAKE 1 TABLET BY MOUTH TWICE A DAY 60 tablet 5   furosemide (LASIX) 40 MG tablet TAKE 1 TABLET BY MOUTH TWICE  DAILY 180 tablet 3   lidocaine-prilocaine (EMLA) cream APPLY TO AFFECTED AREA ONCE AS DIRECTED 30 g 3   meloxicam (MOBIC) 15 MG tablet TAKE 1  TABLET BY MOUTH EVERY DAY AS NEEDED FOR PAIN 90 tablet 3   montelukast (SINGULAIR) 10 MG tablet Take 1 tablet (10 mg total) by mouth daily. Overdue for Annual appt must see provider for future refills 30 tablet 0   Multiple Vitamin (MULTIVITAMIN WITH MINERALS) TABS tablet Take 1 tablet by mouth daily.     rizatriptan (MAXALT) 10 MG tablet Take 10 mg by mouth as needed for migraine. May repeat in 2 hours if needed     rosuvastatin (CRESTOR) 20 MG tablet Take 1 tablet (20 mg total) by mouth daily. 90 tablet 3   scopolamine (TRANSDERM-SCOP, 1.5 MG,) 1 MG/3DAYS Place 1 patch (1.5 mg total) onto the skin every 3 (three) days. 10 patch 0   spironolactone (ALDACTONE) 25 MG tablet TAKE ONE-HALF TABLET BY MOUTH  DAILY 45 tablet 3   Tiotropium Bromide-Olodaterol (STIOLTO RESPIMAT) 2.5-2.5 MCG/ACT AERS Inhale 2 puffs into the lungs daily. 1 each 11   traMADol (ULTRAM) 50 MG tablet Take 1 tablet (50 mg total) by mouth every 6 (six) hours as needed. 120 tablet 2   triamcinolone cream (KENALOG) 0.1 % APPLY TO AFFECTED AREA TWICE A DAY 30 g 0   No current facility-administered medications for this visit.     Past Medical History:  Diagnosis Date   AICD (automatic cardioverter/defibrillator) present    ALLERGIC RHINITIS 11/06/2007   ANEMIA-IRON DEFICIENCY 05/23/2007   ANEMIA-NOS 06/17/2008   ANXIETY 02/22/2008   CARDIOMYOPATHY, PRIMARY, DILATED 12/30/2008   DEGENERATIVE [place] DISEASE, 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   GERD 05/07/2007   History of kidney stones  HYPERLIPIDEMIA 05/23/2007   HYPERTENSION 05/07/2007   KELOID SCAR, 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   MENOPAUSAL DISORDER 05/06/2008   OSTEOARTHRITIS, KNEES, 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   OVERACTIVE BLADDER 08/03/2010   PYELONEPHRITIS, HX OF 16/96/7893   SYSTOLIC HEART FAILURE, CHRONIC 12/28/2008   VITAMIN D DEFICIENCY 06/17/2008    Past Surgical History:  Procedure Laterality Date   ABDOMINAL HYSTERECTOMY     BREAST LUMPECTOMY WITH RADIOACTIVE SEED AND SENTINEL LYMPH NODE  BIOPSY Right 10/11/2018   Procedure: RIGHT BREAST LUMPECTOMY WITH RADIOACTIVE SEED AND SENTINEL LYMPH NODE BIOPSY;  Surgeon: Stark Klein, MD;  Location: [place];  Service: General;  Laterality: Right;   ICD GENERATOR CHANGEOUT N/A 10/25/2017   Procedure: ICD GENERATOR CHANGEOUT;  Surgeon: Deboraha Sprang, MD;  Location: [place] [facility];  Service: Cardiovascular;  Laterality: N/A;   LAMINECTOMY     PORT-A-CATH REMOVAL N/A 05/08/2019   Procedure: REMOVAL OF PORT-A-CATH RIGHT CHEST;  Surgeon: Stark Klein, MD;  Location: [place];  Service: General;  Laterality: N/A;   PORTACATH PLACEMENT N/A 10/11/2018   Procedure: INSERTION PORT-A-CATH;  Surgeon: Stark Klein, MD;  Location: [place];  Service: General;  Laterality: N/A;   RE-EXCISION OF BREAST LUMPECTOMY Right 10/23/2018   Procedure: RE-EXCISION OF RIGHT BREAST LUMPECTOMY;  Surgeon: Stark Klein, MD;  Location: [place];  Service: General;  Laterality: Right;    Social History   Socioeconomic History   Marital status: Widowed    Spouse name: Not on file   Number of children: 3   Years of education: Not on file   Highest education level: Not on file  Occupational History   Occupation: retired Ambulance person: RETIRED  Tobacco Use   Smoking status: Never   Smokeless tobacco: Never  Substance and Sexual Activity   Alcohol use: No   Drug use: No   Sexual activity: Never  Other Topics Concern   Not on file  Social History Narrative   Not on file   Social Determinants of Health   Financial Resource Strain: Low Risk  (01/25/2022)   Overall Financial Resource Strain (CARDIA)    Difficulty of Paying Living Expenses: Not hard at all  Food Insecurity: No Food Insecurity (01/25/2022)   Hunger Vital Sign    Worried About Running Out of Food in the Last Year: Never true    Learned in the Last Year: Never true  Transportation Needs: No Transportation Needs (01/25/2022)   PRAPARE - Hydrologist  (Medical): No    Lack of Transportation (Non-Medical): No  Physical Activity: Sufficiently Active (01/25/2022)   Exercise Vital Sign    Days of Exercise per Week: 7 days    Minutes of Exercise per Session: 30 min  Stress: No Stress Concern Present (01/25/2022)   [place]    Feeling of Stress : Not at all  Social Connections: Moderately Integrated (01/25/2022)   Social Connection and Isolation Panel [NHANES]    Frequency of Communication with Friends and Family: More than three times a week    Frequency of Social Gatherings with Friends and Family: Once a week    Attends Religious Services: More than 4 times per year    Active Member of [employer] or Organizations: No    Attends Music therapist: More than 4 times per year    Marital Status: Widowed  Intimate Partner Violence: Not At Risk (01/25/2022)   Humiliation, Afraid, Rape,  and Kick questionnaire    Fear of Current or Ex-Partner: No    Emotionally Abused: No    Physically Abused: No    Sexually Abused: No    Family History  Problem Relation Age of Onset   Arthritis Other    Coronary artery disease Other    Kidney disease Other    Heart disease Mother     ROS: no fevers or chills, productive cough, hemoptysis, dysphasia, odynophagia, melena, hematochezia, dysuria, hematuria, rash, seizure activity, orthopnea, PND, pedal edema, claudication. Remaining systems are negative.  Physical Exam: Well-developed well-nourished in no acute distress.  Skin is warm and dry.  HEENT is normal.  Neck is supple.  Chest is clear to auscultation with normal expansion.  Cardiovascular exam is regular rate and rhythm.  Abdominal exam nontender or distended. No masses palpated. Extremities show no edema. neuro grossly intact  A/P  1 chronic systolic congestive heart failure-patient remains euvolemic on examination.  Continue diuretics at present dose.  Continue  Farxiga.  2 nonischemic cardiomyopathy-continue Entresto, beta-blocker and spironolactone.  3 hyperlipidemia-continue statin.  4 hypertension-patient's blood pressure is controlled today.  Continue present medical regimen.  5 ICD-Per EP.  6 history of PVCs-continue beta-blocker.  As outlined in previous notes she was seen by Dr. Lovena Le and ablation was not pursued.  Kirk Ruths, MD

## 2022-01-25 ENCOUNTER — Ambulatory Visit (INDEPENDENT_AMBULATORY_CARE_PROVIDER_SITE_OTHER): Payer: Medicare Other

## 2022-01-25 DIAGNOSIS — Z0000 Encounter for general adult medical examination without abnormal findings: Secondary | ICD-10-CM

## 2022-01-25 DIAGNOSIS — Z1211 Encounter for screening for malignant neoplasm of colon: Secondary | ICD-10-CM | POA: Diagnosis not present

## 2022-01-25 NOTE — Patient Instructions (Signed)
Ms. [name] [name] , Thank you for taking time to come for your Medicare Wellness Visit. I appreciate your ongoing commitment to your health goals. Please review the following plan we discussed and let me know if I can assist you in the future.   Screening recommendations/referrals: Cologuard Kit ordered 01/25/2022 Mammogram: 09/27/2021; due every year (due 09/28/2022) Bone Density: never done Recommended yearly ophthalmology/optometry visit for glaucoma screening and checkup Recommended yearly dental visit for hygiene and checkup  Vaccinations: Influenza vaccine: 03/18/2021 Pneumococcal vaccine: 02/07/2013, 05/01/2014 Tdap vaccine: 05/22/2018; due every 10 years (due 05/22/2028) Shingles vaccine: never done   Covid-19: 08/28/2019, 09/18/2019, 04/24/2020, 04/03/2021  Advanced directives: Yes; Please bring a copy of your health care power of attorney and living will to the office at your convenience.  Conditions/risks identified: Yes  Next appointment: Please schedule your next Medicare Wellness Visit with your Nurse Health Advisor in 1 year by calling [phone].   Preventive Care 65 Years and Older, Female Preventive care refers to lifestyle choices and visits with your health care provider that can promote health and wellness. What does preventive care include? A yearly physical exam. This is also called an annual well check. Dental exams once or twice a year. Routine eye exams. Ask your health care provider how often you should have your eyes checked. Personal lifestyle choices, including: Daily care of your teeth and gums. Regular physical activity. Eating a healthy diet. Avoiding tobacco and drug use. Limiting alcohol use. Practicing safe sex. Taking low-dose aspirin every day. Taking vitamin and mineral supplements as recommended by your health care provider. What happens during an annual well check? The services and screenings done by your health care provider during your annual  well check will depend on your age, overall health, lifestyle risk factors, and family history of disease. Counseling  Your health care provider may ask you questions about your: Alcohol use. Tobacco use. Drug use. Emotional well-being. Home and relationship well-being. Sexual activity. Eating habits. History of falls. Memory and ability to understand (cognition). Work and work environment. Reproductive health. Screening  You may have the following tests or measurements: Height, weight, and BMI. Blood pressure. Lipid and cholesterol levels. These may be checked every 5 years, or more frequently if you are over 50 years old. Skin check. Lung cancer screening. You may have this screening every year starting at age 55 if you have a 30-pack-year history of smoking and currently smoke or have quit within the past 15 years. Fecal occult blood test (FOBT) of the stool. You may have this test every year starting at age 50. Flexible sigmoidoscopy or colonoscopy. You may have a sigmoidoscopy every 5 years or a colonoscopy every 10 years starting at age 50. Hepatitis C blood test. Hepatitis B blood test. Sexually transmitted disease (STD) testing. Diabetes screening. This is done by checking your blood sugar (glucose) after you have not eaten for a while (fasting). You may have this done every 1-3 years. Bone density scan. This is done to screen for osteoporosis. You may have this done starting at age 79. Mammogram. This may be done every 1-2 years. Talk to your health care provider about how often you should have regular mammograms. Talk with your health care provider about your test results, treatment options, and if necessary, the need for more tests. Vaccines  Your health care provider may recommend certain vaccines, such as: Influenza vaccine. This is recommended every year. Tetanus, diphtheria, and acellular pertussis (Tdap, Td) vaccine. You may need a Td booster   every 10 years. Zoster  vaccine. You may need this after age 67. Pneumococcal 13-valent conjugate (PCV13) vaccine. One dose is recommended after age 63. Pneumococcal polysaccharide (PPSV23) vaccine. One dose is recommended after age 58. Talk to your health care provider about which screenings and vaccines you need and how often you need them. This information is not intended to replace advice given to you by your health care provider. Make sure you discuss any questions you have with your health care provider. Document Released: 07/24/2015 Document Revised: 03/16/2016 Document Reviewed: 04/28/2015 Elsevier Interactive Patient Education  2017 [place] Prevention in the Home Falls can cause injuries. They can happen to people of all ages. There are many things you can do to make your home safe and to help prevent falls. What can I do on the outside of my home? Regularly fix the edges of walkways and driveways and fix any cracks. Remove anything that might make you trip as you walk through a door, such as a raised step or threshold. Trim any bushes or trees on the path to your home. Use bright outdoor lighting. Clear any walking paths of anything that might make someone trip, such as rocks or tools. Regularly check to see if handrails are loose or broken. Make sure that both sides of any steps have handrails. Any raised decks and porches should have guardrails on the edges. Have any leaves, snow, or ice cleared regularly. Use sand or salt on walking paths during winter. Clean up any spills in your garage right away. This includes oil or grease spills. What can I do in the bathroom? Use night lights. Install grab bars by the toilet and in the tub and shower. Do not use towel bars as grab bars. Use non-skid mats or decals in the tub or shower. If you need to sit down in the shower, use a plastic, non-slip stool. Keep the floor dry. Clean up any water that spills on the floor as soon as it happens. Remove  soap buildup in the tub or shower regularly. Attach bath mats securely with double-sided non-slip rug tape. Do not have throw rugs and other things on the floor that can make you trip. What can I do in the bedroom? Use night lights. Make sure that you have a light by your bed that is easy to reach. Do not use any sheets or blankets that are too big for your bed. They should not hang down onto the floor. Have a firm chair that has side arms. You can use this for support while you get dressed. Do not have throw rugs and other things on the floor that can make you trip. What can I do in the kitchen? Clean up any spills right away. Avoid walking on wet floors. Keep items that you use a lot in easy-to-reach places. If you need to reach something above you, use a strong step stool that has a grab bar. Keep electrical cords out of the way. Do not use floor polish or wax that makes floors slippery. If you must use wax, use non-skid floor wax. Do not have throw rugs and other things on the floor that can make you trip. What can I do with my stairs? Do not leave any items on the stairs. Make sure that there are handrails on both sides of the stairs and use them. Fix handrails that are broken or loose. Make sure that handrails are as long as the stairways. Check any carpeting to  make sure that it is firmly attached to the stairs. Fix any carpet that is loose or worn. Avoid having throw rugs at the top or bottom of the stairs. If you do have throw rugs, attach them to the floor with carpet tape. Make sure that you have a light switch at the top of the stairs and the bottom of the stairs. If you do not have them, ask someone to add them for you. What else can I do to help prevent falls? Wear shoes that: Do not have high heels. Have rubber bottoms. Are comfortable and fit you well. Are closed at the toe. Do not wear sandals. If you use a stepladder: Make sure that it is fully opened. Do not climb a  closed stepladder. Make sure that both sides of the stepladder are locked into place. Ask someone to hold it for you, if possible. Clearly mark and make sure that you can see: Any grab bars or handrails. First and last steps. Where the edge of each step is. Use tools that help you move around (mobility aids) if they are needed. These include: Canes. Walkers. Scooters. Crutches. Turn on the lights when you go into a dark area. Replace any light bulbs as soon as they burn out. Set up your furniture so you have a clear path. Avoid moving your furniture around. If any of your floors are uneven, fix them. If there are any pets around you, be aware of where they are. Review your medicines with your doctor. Some medicines can make you feel dizzy. This can increase your chance of falling. Ask your doctor what other things that you can do to help prevent falls. This information is not intended to replace advice given to you by your health care provider. Make sure you discuss any questions you have with your health care provider. Document Released: 04/23/2009 Document Revised: 12/03/2015 Document Reviewed: 08/01/2014 [employer] Interactive Patient Education  2017 [employer]  

## 2022-01-25 NOTE — Progress Notes (Signed)
I connected with [name] [name] today by telephone and verified that I am speaking with the correct person using two identifiers. Location patient: home Location provider: work Persons participating in the virtual visit: patient, provider.   I discussed the limitations, risks, security and privacy concerns of performing an evaluation and management service by telephone and the availability of in person appointments. I also discussed with the patient that there may be a patient responsible charge related to this service. The patient expressed understanding and verbally consented to this telephonic visit.    Interactive audio and video telecommunications were attempted between this provider and patient, however failed, due to patient having technical difficulties OR patient did not have access to video capability.  We continued and completed visit with audio only.  Some vital signs may be absent or patient reported.   Time Spent with patient on telephone encounter: 30 minutes  Subjective:   [name] [name] is a 81 y.o. female who presents for Medicare Annual (Subsequent) preventive examination.  Review of Systems     Cardiac Risk Factors include: advanced age (>81mn, >>77women);dyslipidemia;family history of premature cardiovascular disease;hypertension;obesity (BMI >30kg/m2)     Objective:    Today's Vitals   01/25/22 1048  PainSc: 0-No pain   There is no height or weight on file to calculate BMI.     01/25/2022   10:56 AM 11/25/2020   10:16 AM 07/22/2019   12:13 PM 05/03/2019    8:16 AM 03/15/2019    8:32 AM 10/23/2018   11:09 AM 10/11/2018    5:59 AM  Advanced Directives  Does Patient Have a Medical Advance Directive? Yes Yes Yes No No No No  Type of Advance Directive Living will;Healthcare Power of Attorney Living will       Does patient want to make changes to medical advance directive? No - Patient declined No - Patient declined       Copy of [place]  Chart? No - copy requested        Would patient like information on creating a medical advance directive?    No - Patient declined  No - Patient declined No - Patient declined    Current Medications (verified) Outpatient Encounter Medications as of 01/25/2022  Medication Sig   albuterol (PROVENTIL HFA;VENTOLIN HFA) 108 (90 Base) MCG/ACT inhaler Inhale 1 puff into the lungs every 6 (six) hours as needed for wheezing or shortness of breath.   benzonatate (TESSALON PERLES) 100 MG capsule 1-2 tab by mouth evry 8 hr as needed   carvedilol (COREG) 25 MG tablet TAKE ONE-HALF TABLET BY MOUTH  TWICE DAILY   cholecalciferol (VITAMIN D3) 25 MCG (1000 UT) tablet Take 1,000 Units by mouth daily.   dapagliflozin propanediol (FARXIGA) 10 MG TABS tablet Take 1 tablet (10 mg total) by mouth daily before breakfast.   ENTRESTO 24-26 MG TAKE 1 TABLET BY MOUTH TWICE A DAY   furosemide (LASIX) 40 MG tablet TAKE 1 TABLET BY MOUTH TWICE  DAILY   ibuprofen (ADVIL) 800 MG tablet TAKE 1 TABLET (800 MG TOTAL) EVERY 8 (EIGHT) HOURS AS NEEDED BY MOUTH.   lidocaine-prilocaine (EMLA) cream APPLY TO AFFECTED AREA ONCE AS DIRECTED   meloxicam (MOBIC) 15 MG tablet TAKE 1 TABLET BY MOUTH EVERY DAY AS NEEDED FOR PAIN   montelukast (SINGULAIR) 10 MG tablet Take 1 tablet (10 mg total) by mouth daily. Overdue for Annual appt must see provider for future refills   Multiple Vitamin (MULTIVITAMIN WITH MINERALS)  TABS tablet Take 1 tablet by mouth daily.   rizatriptan (MAXALT) 10 MG tablet Take 10 mg by mouth as needed for migraine. May repeat in 2 hours if needed   rosuvastatin (CRESTOR) 20 MG tablet Take 1 tablet (20 mg total) by mouth daily.   scopolamine (TRANSDERM-SCOP, 1.5 MG,) 1 MG/3DAYS Place 1 patch (1.5 mg total) onto the skin every 3 (three) days.   spironolactone (ALDACTONE) 25 MG tablet TAKE ONE-HALF TABLET BY MOUTH  DAILY   Tiotropium Bromide-Olodaterol (STIOLTO RESPIMAT) 2.5-2.5 MCG/ACT AERS Inhale 2 puffs into the lungs  daily.   traMADol (ULTRAM) 50 MG tablet Take 1 tablet (50 mg total) by mouth every 6 (six) hours as needed.   triamcinolone cream (KENALOG) 0.1 % APPLY TO AFFECTED AREA TWICE A DAY   [DISCONTINUED] prochlorperazine (COMPAZINE) 10 MG tablet TAKE 1 TABLET (10 MG TOTAL) BY MOUTH EVERY 6 (SIX) HOURS AS NEEDED (NAUSEA OR VOMITING).   No facility-administered encounter medications on file as of 01/25/2022.    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   OVERACTIVE BLADDER 08/03/2010   PYELONEPHRITIS, HX OF 19/37/9024   SYSTOLIC HEART FAILURE, CHRONIC 12/28/2008   VITAMIN D DEFICIENCY 06/17/2008   Past Surgical History:  Procedure Laterality Date   ABDOMINAL HYSTERECTOMY     BREAST LUMPECTOMY WITH RADIOACTIVE SEED AND SENTINEL LYMPH NODE BIOPSY Right 10/11/2018   Procedure: RIGHT BREAST LUMPECTOMY WITH RADIOACTIVE SEED AND SENTINEL LYMPH NODE BIOPSY;  Surgeon: Stark Klein, MD;  Location: [facility];  Service: General;  Laterality: Right;   ICD GENERATOR CHANGEOUT N/A 10/25/2017   Procedure: ICD [place];  Surgeon: Deboraha Sprang, MD;  Location: [facility];  Service: Cardiovascular;  Laterality: N/A;   LAMINECTOMY     PORT-A-CATH REMOVAL N/A 05/08/2019   Procedure: REMOVAL OF PORT-A-CATH RIGHT CHEST;  Surgeon: Stark Klein, MD;  Location: [facility];  Service: General;  Laterality: N/A;   PORTACATH PLACEMENT N/A 10/11/2018    Procedure: INSERTION PORT-A-CATH;  Surgeon: Stark Klein, MD;  Location: [facility];  Service: General;  Laterality: N/A;   RE-EXCISION OF BREAST LUMPECTOMY Right 10/23/2018   Procedure: RE-EXCISION OF RIGHT BREAST LUMPECTOMY;  Surgeon: Stark Klein, MD;  Location: [place] [facility];  Service: General;  Laterality: Right;   Family History  Problem Relation Age of Onset   Arthritis Other    Coronary artery disease Other    Kidney disease Other    Heart disease Mother    Social History   Socioeconomic History   Marital status: Widowed    Spouse name: Not on file   Number of children: 3   Years of education: Not on file   Highest education level: Not on file  Occupational History   Occupation: retired Microbiologist authorizer    Employer: RETIRED  Tobacco Use   Smoking status: Never   Smokeless tobacco: Never  Substance and Sexual Activity   Alcohol use: No   Drug use: No   Sexual activity: Never  Other Topics  Concern   Not on file  Social History Narrative   Not on file   Social Determinants of Health   Financial Resource Strain: Low Risk  (01/25/2022)   Overall Financial Resource Strain (CARDIA)    Difficulty of Paying Living Expenses: Not hard at all  Food Insecurity: No Food Insecurity (01/25/2022)   Hunger Vital Sign    Worried About Running Out of Food in the Last Year: Never true    Ran Out of Food in the Last Year: Never true  Transportation Needs: No Transportation Needs (01/25/2022)   PRAPARE - Hydrologist (Medical): No    Lack of Transportation (Non-Medical): No  Physical Activity: Sufficiently Active (01/25/2022)   Exercise Vital Sign    Days of Exercise per Week: 7 days    Minutes of Exercise per Session: 30 min  Stress: No Stress Concern Present (01/25/2022)   [place]    Feeling of Stress : Not at all  Social Connections: Moderately Integrated (01/25/2022)   Social Connection and  Isolation Panel [NHANES]    Frequency of Communication with Friends and Family: More than three times a week    Frequency of Social Gatherings with Friends and Family: Once a week    Attends Religious Services: More than 4 times per year    Active Member of [employer] or Organizations: No    Attends Music therapist: More than 4 times per year    Marital Status: Widowed    Tobacco Counseling Counseling given: Not Answered   Clinical Intake:  Pre-visit preparation completed: Yes  Pain : No/denies pain Pain Score: 0-No pain Pain Type: Acute pain Pain Location: Ear Pain Orientation: Left Pain Descriptors / Indicators: Aching, Discomfort, Throbbing Pain Onset: 1 to 4 weeks ago Pain Frequency: Constant Pain Relieving Factors: Need antibiotic Effect of Pain on Daily Activities: Pain is affecting left eye  Pain Relieving Factors: Need antibiotic  BMI - recorded: 32.73 Nutritional Status: BMI > 30  Obese Nutritional Risks: None Diabetes: No  How often do you need to have someone help you when you read instructions, pamphlets, or other written materials from your doctor or pharmacy?: 1 - Never What is the last grade level you completed in school?: HSG; 2 years of college  Diabetic? no  Interpreter Needed?: No  Information entered by :: Lisette Abu, LPN.   Activities of Daily Living    01/25/2022   11:00 AM  In your present state of health, do you have any difficulty performing the following activities:  Hearing? 0  Vision? 0  Difficulty concentrating or making decisions? 0  Walking or climbing stairs? 1  Comment uses a cane  Dressing or bathing? 0  Doing errands, shopping? 0  Preparing Food and eating ? N  Using the Toilet? N  In the past six months, have you accidently leaked urine? N  Do you have problems with loss of bowel control? N  Managing your Medications? N  Managing your Finances? N  Housekeeping or managing your Housekeeping? N     Patient Care Team: Biagio Borg, MD as PCP - General [facility] Breed Denice Bors, MD as Consulting Physician (Cardiology) Deboraha Sprang, MD as Consulting Physician (Cardiology) Nicholas Lose, MD as Consulting Physician (Hematology and Oncology) Stark Klein, MD as Consulting Physician (General Surgery) Eppie Gibson, MD as Attending Physician (Radiation Oncology)  Indicate any recent Medical Services you may have received from other than [facility]  providers in the past year (date may be approximate).     Assessment:   This is a routine wellness examination for [facility].  Hearing/Vision screen Hearing Screening - Comments:: Patient denied any hearing difficulty.   No hearing aids.  Vision Screening - Comments:: Patient does wear readers.  Eye exam done by: Carmel Sacramento, OD.   Dietary issues and exercise activities discussed: Current Exercise Habits: Home exercise routine, Type of exercise: walking;Other - see comments (chair exercises), Time (Minutes): 30, Frequency (Times/Week): 7, Weekly Exercise (Minutes/Week): 210, Intensity: Mild, Exercise limited by: orthopedic condition(s)   Goals Addressed             This Visit's Progress    To keep my bones from getting stiff.        Depression Screen    01/25/2022   10:55 AM 12/01/2021    2:34 PM 12/01/2021    2:21 PM 11/25/2020   10:15 AM 10/29/2019   11:24 AM 10/29/2019   10:55 AM 05/31/2019    9:58 AM  PHQ 2/9 Scores  PHQ - 2 Score 0 0 0 0 0 0 0  PHQ- 9 Score   1        Fall Risk    01/25/2022   10:57 AM 12/01/2021    2:34 PM 12/01/2021    2:21 PM 11/25/2020   10:17 AM 10/29/2019   11:24 AM  Fall Risk   Falls in the past year? 0 0 0 0 0  Number falls in past yr: 0 0  0   Injury with Fall? 0 0  0   Risk for fall due to : No Fall Risks   No Fall Risks   Follow up Falls evaluation completed   Falls evaluation completed     [place]:  Any stairs in or around the home? Yes  If so, are there  any without handrails? No  Home free of loose throw rugs in walkways, pet beds, electrical cords, etc? Yes  Adequate lighting in your home to reduce risk of falls? Yes   ASSISTIVE DEVICES UTILIZED TO PREVENT FALLS:  Life alert? Yes  Use of a cane, walker or w/c? Yes  Grab bars in the bathroom? No  Shower chair or bench in shower? Yes  Elevated toilet seat or a handicapped toilet? Yes   TIMED UP AND GO:  Was the test performed? No .  Length of time to ambulate 10 feet: n/a sec.   Appearance of gait: Gait not evaluated during this visit.  Cognitive Function:       01/25/2022   11:07 AM  6CIT Screen  What Year? 0 points  What month? 0 points  What time? 0 points  Count back from 20 0 points  Months in reverse 0 points  Repeat phrase 0 points  Total Score 0 points    Immunizations Immunization History  Administered Date(s) Administered   Fluad Quad(high Dose 65+) 05/31/2019   Influenza-Unspecified 03/18/2021   PFIZER(Purple Top)SARS-COV-2 Vaccination 08/28/2019, 09/18/2019, 04/24/2020, 04/03/2021   Pneumococcal Conjugate-13 02/07/2013   Pneumococcal Polysaccharide-23 05/01/2014   Tdap 05/22/2018    TDAP status: Up to date  Flu Vaccine status: Up to date  Pneumococcal vaccine status: Up to date  Covid-19 vaccine status: Completed vaccines  Qualifies for Shingles Vaccine? Yes   Zostavax completed No   Shingrix Completed?: No.    Education has been provided regarding the importance of this vaccine. Patient has been advised to call insurance  company to determine out of pocket expense if they have not yet received this vaccine. Advised may also receive vaccine at local pharmacy or Health Dept. Verbalized acceptance and understanding.  Screening Tests Health Maintenance  Topic Date Due   COVID-19 Vaccine (5 - Booster for Pfizer series) 05/29/2021   Zoster Vaccines- Shingrix (1 of 2) 03/03/2022 (Originally 11/21/1959)   DEXA SCAN  12/02/2022 (Originally 11/22/2007)    TETANUS/TDAP  05/22/2028   Pneumonia Vaccine 40+ Years old  Completed   Hepatitis C Screening  Completed   HPV VACCINES  Aged Out   INFLUENZA VACCINE  Discontinued    Health Maintenance  Health Maintenance Due  Topic Date Due   COVID-19 Vaccine (5 - Booster for [place] series) 05/29/2021    Colorectal Cancer screening: Cologuard Kit ordered 01/25/2022  Mammogram status: Completed 09/27/2021. Repeat every year  Bone Density status: never done  Lung Cancer Screening: (Low Dose CT Chest recommended if Age 32-80 years, 30 pack-year currently smoking OR have quit w/in 15years.) does not qualify.   Lung Cancer Screening Referral: no  Additional Screening:  Hepatitis C Screening: does qualify; Completed 12/01/2021  Vision Screening: Recommended annual ophthalmology exams for early detection of glaucoma and other disorders of the eye. Is the patient up to date with their annual eye exam?  Yes  Who is the provider or what is the name of the office in which the patient attends annual eye exams? Carmel Sacramento, OD. If pt is not established with a provider, would they like to be referred to a provider to establish care? No .   Dental Screening: Recommended annual dental exams for proper oral hygiene  Community Resource Referral / Chronic Care Management: CRR required this visit?  No   CCM required this visit?  No      Plan:     I have personally reviewed and noted the following in the patient's chart:   Medical and social history Use of alcohol, tobacco or illicit drugs  Current medications and supplements including opioid prescriptions.  Functional ability and status Nutritional status Physical activity Advanced directives List of other physicians Hospitalizations, surgeries, and ER visits in previous 12 months Vitals Screenings to include cognitive, depression, and falls Referrals and appointments  In addition, I have reviewed and discussed with patient certain preventive  protocols, quality metrics, and best practice recommendations. A written personalized care plan for preventive services as well as general preventive health recommendations were provided to patient.     Sheral Flow, LPN   8/88/9169   Nurse Notes:  There were no vitals filed for this visit. There is no height or weight on file to calculate BMI.

## 2022-01-28 ENCOUNTER — Other Ambulatory Visit: Payer: Self-pay | Admitting: Internal Medicine

## 2022-02-01 ENCOUNTER — Encounter: Payer: Self-pay | Admitting: Cardiology

## 2022-02-01 ENCOUNTER — Ambulatory Visit: Payer: Medicare Other | Admitting: Cardiology

## 2022-02-01 ENCOUNTER — Ambulatory Visit (INDEPENDENT_AMBULATORY_CARE_PROVIDER_SITE_OTHER): Payer: Medicare Other

## 2022-02-01 VITALS — BP 124/78 | HR 71 | Ht 67.0 in | Wt 207.0 lb

## 2022-02-01 DIAGNOSIS — Z95810 Presence of automatic (implantable) cardiac defibrillator: Secondary | ICD-10-CM | POA: Diagnosis not present

## 2022-02-01 DIAGNOSIS — I5022 Chronic systolic (congestive) heart failure: Secondary | ICD-10-CM | POA: Diagnosis not present

## 2022-02-01 DIAGNOSIS — I10 Essential (primary) hypertension: Secondary | ICD-10-CM

## 2022-02-01 DIAGNOSIS — I428 Other cardiomyopathies: Secondary | ICD-10-CM

## 2022-02-01 LAB — CUP PACEART REMOTE DEVICE CHECK
Battery Remaining Longevity: 85 mo
Battery Voltage: 3 V
Brady Statistic RV Percent Paced: 0.01 %
Date Time Interrogation Session: 20230725042404
HighPow Impedance: 40 Ohm
HighPow Impedance: 48 Ohm
Implantable Lead Implant Date: 20081215
Implantable Lead Location: 753860
Implantable Lead Model: 6947
Implantable Pulse Generator Implant Date: 20190418
Lead Channel Impedance Value: 323 Ohm
Lead Channel Impedance Value: 399 Ohm
Lead Channel Pacing Threshold Amplitude: 1 V
Lead Channel Pacing Threshold Pulse Width: 0.4 ms
Lead Channel Sensing Intrinsic Amplitude: 21 mV
Lead Channel Sensing Intrinsic Amplitude: 21 mV
Lead Channel Setting Pacing Amplitude: 2.5 V
Lead Channel Setting Pacing Pulse Width: 0.4 ms
Lead Channel Setting Sensing Sensitivity: 0.3 mV

## 2022-02-01 NOTE — Patient Instructions (Signed)

## 2022-02-02 ENCOUNTER — Encounter: Payer: Self-pay | Admitting: Cardiology

## 2022-02-03 ENCOUNTER — Other Ambulatory Visit: Payer: Self-pay

## 2022-02-04 MED ORDER — NITROGLYCERIN 0.4 MG/SPRAY TL SOLN: 1.0000 | 3 refills | Status: AC | PRN

## 2022-02-08 DIAGNOSIS — Z1211 Encounter for screening for malignant neoplasm of colon: Secondary | ICD-10-CM | POA: Diagnosis not present

## 2022-02-17 LAB — COLOGUARD: COLOGUARD: NEGATIVE

## 2022-02-26 ENCOUNTER — Other Ambulatory Visit: Payer: Self-pay | Admitting: Internal Medicine

## 2022-02-26 NOTE — Telephone Encounter (Signed)
Please refill as per office routine med refill policy (all routine meds to be refilled for 3 mo or monthly (per pt preference) up to one year from last visit, then month to month grace period for 3 mo, then further med refills will have to be denied) ? ?

## 2022-02-28 ENCOUNTER — Other Ambulatory Visit: Payer: Self-pay | Admitting: Internal Medicine

## 2022-02-28 NOTE — Telephone Encounter (Signed)
Needs rov 

## 2022-03-01 NOTE — Telephone Encounter (Signed)
53mofollow up schedule for 11/28.

## 2022-03-01 NOTE — Progress Notes (Signed)
Remote ICD transmission.   

## 2022-03-07 ENCOUNTER — Encounter: Payer: Self-pay | Admitting: Cardiology

## 2022-03-10 ENCOUNTER — Other Ambulatory Visit: Payer: Self-pay | Admitting: Internal Medicine

## 2022-03-18 DIAGNOSIS — E875 Hyperkalemia: Secondary | ICD-10-CM | POA: Diagnosis not present

## 2022-03-18 DIAGNOSIS — D631 Anemia in chronic kidney disease: Secondary | ICD-10-CM | POA: Diagnosis not present

## 2022-03-18 DIAGNOSIS — N1831 Chronic kidney disease, stage 3a: Secondary | ICD-10-CM | POA: Diagnosis not present

## 2022-03-18 DIAGNOSIS — I429 Cardiomyopathy, unspecified: Secondary | ICD-10-CM | POA: Diagnosis not present

## 2022-03-18 DIAGNOSIS — C50511 Malignant neoplasm of lower-outer quadrant of right female breast: Secondary | ICD-10-CM | POA: Diagnosis not present

## 2022-03-23 ENCOUNTER — Ambulatory Visit (INDEPENDENT_AMBULATORY_CARE_PROVIDER_SITE_OTHER): Payer: Medicare Other | Admitting: Internal Medicine

## 2022-03-23 VITALS — BP 122/74 | HR 65 | Temp 98.4°F | Ht 67.0 in | Wt 210.0 lb

## 2022-03-23 DIAGNOSIS — J019 Acute sinusitis, unspecified: Secondary | ICD-10-CM

## 2022-03-23 DIAGNOSIS — Z23 Encounter for immunization: Secondary | ICD-10-CM | POA: Diagnosis not present

## 2022-03-23 DIAGNOSIS — I10 Essential (primary) hypertension: Secondary | ICD-10-CM

## 2022-03-23 DIAGNOSIS — J309 Allergic rhinitis, unspecified: Secondary | ICD-10-CM | POA: Diagnosis not present

## 2022-03-23 DIAGNOSIS — N1831 Chronic kidney disease, stage 3a: Secondary | ICD-10-CM

## 2022-03-23 MED ORDER — AMOXICILLIN-POT CLAVULANATE 875-125 MG PO TABS: 1.0000 | ORAL_TABLET | Freq: Two times a day (BID) | ORAL | 0 refills | Status: DC

## 2022-03-23 MED ORDER — TIZANIDINE HCL 2 MG PO TABS: 2.0000 mg | ORAL_TABLET | Freq: Four times a day (QID) | ORAL | 1 refills | Status: DC | PRN

## 2022-03-23 MED ORDER — TRIAMCINOLONE ACETONIDE 55 MCG/ACT NA AERO: 2.0000 | INHALATION_SPRAY | Freq: Every day | NASAL | 12 refills | Status: DC

## 2022-03-23 NOTE — Patient Instructions (Signed)
You had the flu shot today  Please take all new medication as prescribed - the antibiotic, tizanidine for muscle pain as needed, and nasacort OTC for allergies  Please continue all other medications as before, and refills have been done if requested.  Please have the pharmacy call with any other refills you may need.  Please continue your efforts at being more active, low cholesterol diet, and weight control.  You are otherwise up to date with prevention measures today.  Please keep your appointments with your specialists as you may have planned

## 2022-03-23 NOTE — Progress Notes (Signed)
Patient ID: [name] [name], female   DOB: 1941-06-04, 81 y.o.   MRN: [id number]        Chief Complaint: follow up sinus symptoms, allergies, htn, ckd       HPI:  [name] [name] is a 81 y.o. female  Here with 2-3 days acute onset fever, left facial and ear pain, pressure, headache, general weakness and malaise, and greenish d/c, with mild ST and cough, but pt denies chest pain, wheezing, increased sob or doe, orthopnea, PND, increased LE swelling, palpitations, dizziness or syncope.   Does have several wks ongoing nasal allergy symptoms with clearish congestion, itch and sneezing, without fever, pain, ST, cough, swelling or wheezing.   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   OVERACTIVE BLADDER 08/03/2010   PYELONEPHRITIS, HX OF 03/54/6568   SYSTOLIC HEART FAILURE, CHRONIC 12/28/2008   VITAMIN D DEFICIENCY 06/17/2008   Past Surgical History:  Procedure Laterality Date   ABDOMINAL HYSTERECTOMY     BREAST LUMPECTOMY WITH RADIOACTIVE SEED AND SENTINEL LYMPH NODE BIOPSY Right 10/11/2018   Procedure: RIGHT BREAST LUMPECTOMY WITH RADIOACTIVE SEED AND SENTINEL LYMPH NODE BIOPSY;  Surgeon: Stark Klein, MD;  Location:  [facility];  Service: General;  Laterality: Right;   ICD GENERATOR CHANGEOUT N/A 10/25/2017   Procedure: ICD [place];  Surgeon: Deboraha Sprang, MD;  Location: [facility];  Service: Cardiovascular;  Laterality: N/A;   LAMINECTOMY     PORT-A-CATH REMOVAL N/A 05/08/2019   Procedure: REMOVAL OF PORT-A-CATH RIGHT CHEST;  Surgeon: Stark Klein, MD;  Location: [facility];  Service: General;  Laterality: N/A;   PORTACATH PLACEMENT N/A 10/11/2018   Procedure: INSERTION PORT-A-CATH;  Surgeon: Stark Klein, MD;  Location: [facility];  Service: General;  Laterality: N/A;   RE-EXCISION OF BREAST LUMPECTOMY Right 10/23/2018   Procedure: RE-EXCISION OF RIGHT BREAST LUMPECTOMY;  Surgeon: Stark Klein, MD;  Location: [facility];  Service: General;  Laterality: Right;    reports that she has never smoked. She has never used smokeless tobacco. She reports that she does not drink alcohol and does not use drugs. family history includes Arthritis in an other family member; Coronary artery disease in an other family member; Heart disease in her mother; Kidney disease in an other family member. Allergies  Allergen Reactions   Topamax [Topiramate] Other (See Comments)    Confusion, gets lost going home   Zocor [Simvastatin - High Dose] Nausea Only    Nausea    Current Outpatient Medications on File Prior to Visit  Medication Sig Dispense Refill   albuterol (PROVENTIL HFA;VENTOLIN HFA) 108 (90 Base) MCG/ACT inhaler Inhale 1 puff into the lungs every 6 (  six) hours as needed for wheezing or shortness of breath.     benzonatate (TESSALON PERLES) 100 MG capsule 1-2 tab by mouth evry 8 hr as needed 40 capsule 2   carvedilol (COREG) 25 MG tablet TAKE ONE-HALF TABLET BY MOUTH  TWICE DAILY 90 tablet 3   cholecalciferol (VITAMIN D3) 25 MCG (1000 UT) tablet Take 1,000 Units by mouth daily.     dapagliflozin propanediol (FARXIGA) 10 MG TABS tablet Take 1 tablet (10 mg total) by mouth daily before breakfast. 30 tablet 11    ENTRESTO 24-26 MG TAKE 1 TABLET BY MOUTH TWICE A DAY 60 tablet 5   furosemide (LASIX) 40 MG tablet TAKE 1 TABLET BY MOUTH TWICE  DAILY 180 tablet 3   lidocaine-prilocaine (EMLA) cream APPLY TO AFFECTED AREA ONCE AS DIRECTED 30 g 3   meloxicam (MOBIC) 15 MG tablet TAKE 1 TABLET BY MOUTH EVERY DAY AS NEEDED FOR PAIN 90 tablet 3   montelukast (SINGULAIR) 10 MG tablet TAKE 1 TABLET BY MOUTH DAILY. OVERDUE FOR ANNUAL APPT MUST SEE PROVIDER FOR FUTURE REFILLS 30 tablet 0   Multiple Vitamin (MULTIVITAMIN WITH MINERALS) TABS tablet Take 1 tablet by mouth daily.     nitroGLYCERIN (NITROLINGUAL) 0.4 MG/SPRAY spray Place 1 spray under the tongue every 5 (five) minutes x 3 doses as needed for chest pain. 12 g 3   rizatriptan (MAXALT) 10 MG tablet Take 10 mg by mouth as needed for migraine. May repeat in 2 hours if needed     scopolamine (TRANSDERM-SCOP, 1.5 MG,) 1 MG/3DAYS Place 1 patch (1.5 mg total) onto the skin every 3 (three) days. 10 patch 0   spironolactone (ALDACTONE) 25 MG tablet TAKE ONE-HALF TABLET BY MOUTH  DAILY 45 tablet 3   Tiotropium Bromide-Olodaterol (STIOLTO RESPIMAT) 2.5-2.5 MCG/ACT AERS Inhale 2 puffs into the lungs daily. 1 each 11   traMADol (ULTRAM) 50 MG tablet Take 1 tablet (50 mg total) by mouth every 6 (six) hours as needed. 120 tablet 2   triamcinolone cream (KENALOG) 0.1 % APPLY TO AFFECTED AREA TWICE A DAY 30 g 0   rosuvastatin (CRESTOR) 20 MG tablet Take 1 tablet (20 mg total) by mouth daily. (Patient not taking: Reported on 03/23/2022) 90 tablet 3   No current facility-administered medications on file prior to visit.        ROS:  All others reviewed and negative.  Objective        PE:  BP 122/74 (BP Location: Left Arm, Patient Position: Sitting, Cuff Size: Large)   Pulse 65   Temp 98.4 F (36.9 C) (Oral)   Ht '5\' 7"'$  (1.702 m)   Wt 210 lb (95.3 kg)   SpO2 98%   BMI 32.89 kg/m                 Constitutional: Pt appears in NAD               HENT: Head: NCAT.                 Right Ear: External ear normal.                 Left Ear: External ear normal. Bilat tm's with mild erythema.  Max sinus areas mild tender.  Pharynx with mild erythema, no exudate               Eyes: . Pupils are equal, round, and reactive to light. Conjunctivae and EOM are normal  Nose: without d/c or deformity               Neck: Neck supple. Gross normal ROM               Cardiovascular: Normal rate and regular rhythm.                 Pulmonary/Chest: Effort normal and breath sounds without rales or wheezing.                Abd:  Soft, NT, ND, + BS, no organomegaly               Neurological: Pt is alert. At baseline orientation, motor grossly intact               Skin: Skin is warm. No rashes, no other new lesions, LE edema - none               Psychiatric: Pt behavior is normal without agitation   Micro: none  Cardiac tracings I have personally interpreted today:  none  Pertinent Radiological findings (summarize): none   Lab Results  Component Value Date   WBC 6.6 12/01/2021   HGB 11.8 (L) 12/01/2021   HCT 37.4 12/01/2021   PLT 252.0 12/01/2021   GLUCOSE 102 (H) 12/01/2021   CHOL 187 12/01/2021   TRIG 105.0 12/01/2021   HDL 42.90 12/01/2021   LDLDIRECT 129.7 02/07/2013   LDLCALC 123 (H) 12/01/2021   ALT 17 12/01/2021   AST 18 12/01/2021   NA 137 12/01/2021   K 4.2 12/01/2021   CL 101 12/01/2021   CREATININE 1.56 (H) 12/01/2021   BUN 23 12/01/2021   CO2 27 12/01/2021   TSH 0.80 12/01/2021   INR 0.9 RATIO 06/18/2007   HGBA1C 6.1 12/01/2021   MICROALBUR 2.3 (H) 12/01/2021   Assessment/Plan:  [name] [name] is a 81 y.o. Black or African American [2] female with  has a past medical history of AICD (automatic cardioverter/defibrillator) present, ALLERGIC RHINITIS (11/06/2007), ANEMIA-IRON DEFICIENCY (05/23/2007), ANEMIA-NOS (06/17/2008), ANXIETY (02/22/2008), CARDIOMYOPATHY, PRIMARY, DILATED (12/30/2008), DEGENERATIVE DISC DISEASE, LUMBAR SPINE (05/07/2007),  GERD (05/07/2007), History of kidney stones, HYPERLIPIDEMIA (05/23/2007), HYPERTENSION (05/07/2007), KELOID SCAR, HX OF (02/02/2009), MENOPAUSAL DISORDER (05/06/2008), OSTEOARTHRITIS, KNEES, BILATERAL (05/23/2007), OVERACTIVE BLADDER (08/03/2010), PYELONEPHRITIS, HX OF (17/79/3903), SYSTOLIC HEART FAILURE, CHRONIC (12/28/2008), and VITAMIN D DEFICIENCY (06/17/2008).  Acute sinusitis Mild to mod, for antibx course,  to f/u any worsening symptoms or concerns  Allergic rhinitis Mild to mod worsening, for add Nasacort asd,  to f/u any worsening symptoms or concerns  Essential hypertension BP Readings from Last 3 Encounters:  03/23/22 122/74  02/01/22 124/78  12/01/21 120/68   Stable, pt to continue medical treatment coreg 25 bid, entresto   CKD (chronic kidney disease) stage 3, GFR 30-59 ml/min Lab Results  Component Value Date   CREATININE 1.56 (H) 12/01/2021   Stable overall, cont to avoid nephrotoxins  Followup: Return in about 2 months (around 06/07/2022).  Cathlean Cower, MD 03/26/2022 9:51 PM [facility]

## 2022-03-26 ENCOUNTER — Encounter: Payer: Self-pay | Admitting: Internal Medicine

## 2022-03-26 NOTE — Assessment & Plan Note (Signed)
Mild to mod, for antibx course,  to f/u any worsening symptoms or concerns 

## 2022-03-26 NOTE — Assessment & Plan Note (Signed)
Lab Results  Component Value Date   CREATININE 1.56 (H) 12/01/2021   Stable overall, cont to avoid nephrotoxins

## 2022-03-26 NOTE — Assessment & Plan Note (Signed)
BP Readings from Last 3 Encounters:  03/23/22 122/74  02/01/22 124/78  12/01/21 120/68   Stable, pt to continue medical treatment coreg 25 bid, entresto

## 2022-03-26 NOTE — Assessment & Plan Note (Signed)
Mild to mod worsening, for add Nasacort asd,  to f/u any worsening symptoms or concerns

## 2022-03-28 ENCOUNTER — Telehealth: Payer: Self-pay | Admitting: Internal Medicine

## 2022-03-28 NOTE — Telephone Encounter (Signed)
Pt is calling to report about: tiZANidine (ZANAFLEX) 2 MG tablet- Pt states the current dose isn't helping with the back pain at all.  amoxicillin-clavulanate (AUGMENTIN) 875-125 MG tablet-pt tried to swallow this an was choked and even tried cutting it in half and still had a hard time swallowing them. Pt has discontinued this medication and was wondering if there is something else she can try.  Please advise

## 2022-03-28 NOTE — Telephone Encounter (Signed)
Patient is still having pain from last week.She would like for the nurse to call her.  Pain level is 8-10.

## 2022-03-29 MED ORDER — AZITHROMYCIN 250 MG PO TABS: ORAL_TABLET | ORAL | 1 refills | Status: AC

## 2022-03-29 MED ORDER — TRAMADOL HCL 50 MG PO TABS: 50.0000 mg | ORAL_TABLET | Freq: Four times a day (QID) | ORAL | 2 refills | Status: DC | PRN

## 2022-03-29 NOTE — Telephone Encounter (Signed)
Unable to reach patient regarding change in medication.

## 2022-03-29 NOTE — Telephone Encounter (Signed)
Ok for change antibiotic to zpack - done erx  Also ok for tramadol refill for pain - done erx

## 2022-04-12 ENCOUNTER — Other Ambulatory Visit: Payer: Self-pay | Admitting: Internal Medicine

## 2022-04-12 NOTE — Telephone Encounter (Signed)
Please refill as per office routine med refill policy (all routine meds to be refilled for 3 mo or monthly (per pt preference) up to one year from last visit, then month to month grace period for 3 mo, then further med refills will have to be denied) ? ?

## 2022-04-19 ENCOUNTER — Encounter: Payer: Self-pay | Admitting: Pulmonary Disease

## 2022-04-19 ENCOUNTER — Ambulatory Visit: Payer: Medicare Other | Admitting: Pulmonary Disease

## 2022-04-19 VITALS — BP 118/62 | HR 69 | Temp 98.4°F | Ht 67.0 in | Wt 208.2 lb

## 2022-04-19 DIAGNOSIS — H6992 Unspecified Eustachian tube disorder, left ear: Secondary | ICD-10-CM

## 2022-04-19 DIAGNOSIS — R0609 Other forms of dyspnea: Secondary | ICD-10-CM

## 2022-04-19 MED ORDER — AMOXICILLIN-POT CLAVULANATE 875-125 MG PO TABS: 1.0000 | ORAL_TABLET | Freq: Two times a day (BID) | ORAL | 0 refills | Status: AC

## 2022-04-19 MED ORDER — AZELASTINE HCL 0.1 % NA SOLN: 2.0000 | Freq: Two times a day (BID) | NASAL | 2 refills | Status: DC

## 2022-04-19 NOTE — Progress Notes (Signed)
$'@Patient'r$  ID: [name] [name], female    DOB: 07/15/1940, 81 y.o.   MRN: [id number]  Chief Complaint  Patient presents with   Follow-up    Follow up for [place]. Pt states no issues noted with breathing. Pt states that the stiolto is working well for her. Pt states she is still having ear infection in left ear and wants to discuss ABTs again    Referring provider: Biagio Borg, MD  HPI:   81 y.o. woman whom we are seeing in follow up for evaluation of dyspnea on exertion.  Most recent PCP note reviewed.  Most recent cardiology note reviewed.  Doing better. DOE better.  Stiolto continues to be beneficial.  Lungs clear today.  She feels her breathing is doing well.  She has ongoing issues with nasal congestion, seasonal allergies.  Now with left ear pain.  Took 3 days of antibiotics but did not help.  Takes Flonase 1 spray each nostril daily.  Nasacort was prescribed by PCP.  Unfortunately the pharmacy does not have this in stock and she has not started it.  HPI at initial visit: Overall, she is doing relatively well.  She endorses chronic dyspnea on exertion.  Worse on inclines or stairs.  No time of day when things are better or worse.  No seasonal or environmental factors make things better or worse.  No position to make things better or worse.  Has albuterol as needed and this does seem to help a bit.  Was on ICS/LABA inhaler but did not like the way it tasted.  Did not like rinsing out her mouth.  Maybe helped a little bit.  Reviewed most recent chest imaging CT chest high-resolution 04/2020 that on my review interpretation shows basilar right greater than left mild bronchiectasis with small nodule.  CPET 09/2020 reviewed that shows limitation of her body habitus, hypoventilation, chronotropic incompetence.  Spirometry prior to that CPET demonstrated normal spirometry.  Most recent echocardiogram 09/2018 reviewed demonstrating EF of 20%, dilated LA.  PMH: Cardiomyopathy, history of breast cancer  in remission Surgical history: Hysterectomy, lumpectomy for breast cancer Family history: Mother with CAD Social history: Never smoker, lives in [place] / Pulmonary Flowsheets:   ACT:      No data to display           MMRC:     No data to display           Epworth:      No data to display           Tests:   FENO:  No results found for: "NITRICOXIDE"  PFT:     No data to display           WALK:      No data to display           Imaging: Personally reviewed and as per EMR discussion this note No results found.  Lab Results: Personally reviewed CBC    Component Value Date/Time   WBC 6.6 12/01/2021 1508   RBC 4.60 12/01/2021 1508   HGB 11.8 (L) 12/01/2021 1508   HGB 10.1 (L) 02/28/2019 0941   HGB 10.8 (L) 10/11/2017 1252   HCT 37.4 12/01/2021 1508   HCT 33.9 (L) 10/11/2017 1252   PLT 252.0 12/01/2021 1508   PLT 290 02/28/2019 0941   PLT 226 10/11/2017 1252   MCV 81.2 12/01/2021 1508   MCV 79 10/11/2017 1252   MCH 26.3 05/03/2019 0826  MCHC 31.5 12/01/2021 1508   RDW 16.7 (H) 12/01/2021 1508   RDW 16.7 (H) 10/11/2017 1252   LYMPHSABS 3.2 12/01/2021 1508   MONOABS 0.5 12/01/2021 1508   EOSABS 0.3 12/01/2021 1508   BASOSABS 0.0 12/01/2021 1508    BMET    Component Value Date/Time   NA 137 12/01/2021 1508   NA 141 09/27/2021 0912   K 4.2 12/01/2021 1508   CL 101 12/01/2021 1508   CO2 27 12/01/2021 1508   GLUCOSE 102 (H) 12/01/2021 1508   BUN 23 12/01/2021 1508   BUN 22 09/27/2021 0912   CREATININE 1.56 (H) 12/01/2021 1508   CREATININE 1.34 (H) 02/28/2019 0941   CREATININE 1.54 (H) 12/11/2015 1001   CALCIUM 9.7 12/01/2021 1508   GFRNONAA 34 (L) 04/03/2020 1635   GFRNONAA 38 (L) 02/28/2019 0941   GFRAA 39 (L) 04/03/2020 1635   GFRAA 44 (L) 02/28/2019 0941    BNP    Component Value Date/Time   BNP 366.8 (H) 05/19/2017 1235    ProBNP    Component Value Date/Time   PROBNP 55.2 08/20/2010 0935     Specialty Problems       Pulmonary Problems   Allergic rhinitis    Qualifier: Diagnosis of  By: Jenny Reichmann MD, Hunt Oris       Acute sinusitis   Wheezing   Pulmonary nodule   Acute cough    Allergies  Allergen Reactions   Topamax [Topiramate] Other (See Comments)    Confusion, gets lost going home   Zocor [Simvastatin - High Dose] Nausea Only    Nausea     Immunization History  Administered Date(s) Administered   Fluad Quad(high Dose 65+) 05/31/2019, 03/23/2022   Influenza-Unspecified 03/18/2021   PFIZER(Purple Top)SARS-COV-2 Vaccination 08/28/2019, 09/18/2019, 04/24/2020, 04/24/2020, 04/03/2021   Pneumococcal Conjugate-13 02/07/2013   Pneumococcal Polysaccharide-23 05/01/2014   Tdap 05/22/2018    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   OVERACTIVE BLADDER 08/03/2010   PYELONEPHRITIS, HX OF 61/60/7371   SYSTOLIC HEART FAILURE, CHRONIC 12/28/2008   VITAMIN D DEFICIENCY 06/17/2008    Tobacco History: Social History   Tobacco Use  Smoking Status Never  Smokeless Tobacco Never   Counseling given: Not Answered   Continue to not smoke  Outpatient Encounter Medications as of 04/19/2022  Medication Sig   albuterol (PROVENTIL HFA;VENTOLIN HFA) 108 (90 Base) MCG/ACT inhaler Inhale 1 puff into the lungs every 6 (six) hours as needed for wheezing or shortness of breath.   amoxicillin-clavulanate (AUGMENTIN) 875-125 MG tablet Take 1 tablet by mouth 2 (two) times daily for 10 days.   azelastine  (ASTELIN) 0.1 % nasal spray Place 2 sprays into both nostrils 2 (two) times daily. Use in each nostril as directed   benzonatate (TESSALON PERLES) 100 MG capsule 1-2 tab by mouth evry 8 hr as needed   carvedilol (COREG) 25 MG tablet TAKE ONE-HALF TABLET BY MOUTH  TWICE DAILY   cholecalciferol (VITAMIN D3) 25 MCG (1000 UT) tablet Take 1,000 Units by mouth daily.   dapagliflozin propanediol (FARXIGA) 10 MG TABS tablet Take  1 tablet (10 mg total) by mouth daily before breakfast.   ENTRESTO 24-26 MG TAKE 1 TABLET BY MOUTH TWICE A DAY   furosemide (LASIX) 40 MG tablet TAKE 1 TABLET BY MOUTH TWICE  DAILY   lidocaine-prilocaine (EMLA) cream APPLY TO AFFECTED AREA ONCE AS DIRECTED   meloxicam (MOBIC) 15 MG tablet TAKE 1 TABLET BY MOUTH EVERY DAY AS NEEDED FOR PAIN   montelukast (SINGULAIR) 10 MG tablet Take 1 tablet (10 mg total) by mouth at bedtime.   Multiple Vitamin (MULTIVITAMIN WITH MINERALS) TABS tablet Take 1 tablet by mouth daily.   nitroGLYCERIN (NITROLINGUAL) 0.4 MG/SPRAY spray Place 1 spray under the tongue every 5 (five) minutes x 3 doses as needed for chest pain.   rizatriptan (MAXALT) 10 MG tablet Take 10 mg by mouth as needed for migraine. May repeat in 2 hours if needed   rosuvastatin (CRESTOR) 20 MG tablet Take 1 tablet (20 mg total) by mouth daily.   scopolamine (TRANSDERM-SCOP, 1.5 MG,) 1 MG/3DAYS Place 1 patch (1.5 mg total) onto the skin every 3 (three) days.   spironolactone (ALDACTONE) 25 MG tablet TAKE ONE-HALF TABLET BY MOUTH  DAILY   Tiotropium Bromide-Olodaterol (STIOLTO RESPIMAT) 2.5-2.5 MCG/ACT AERS Inhale 2 puffs into the lungs daily.   tiZANidine (ZANAFLEX) 2 MG tablet Take 1 tablet (2 mg total) by mouth every 6 (six) hours as needed for muscle spasms.   traMADol (ULTRAM) 50 MG tablet Take 1 tablet (50 mg total) by mouth every 6 (six) hours as needed.   triamcinolone (NASACORT) 55 MCG/ACT AERO nasal inhaler Place 2 sprays into the nose daily.   triamcinolone cream (KENALOG)  0.1 % APPLY TO AFFECTED AREA TWICE A DAY   [DISCONTINUED] amoxicillin-clavulanate (AUGMENTIN) 875-125 MG tablet Take 1 tablet by mouth 2 (two) times daily.   No facility-administered encounter medications on file as of 04/19/2022.     Review of Systems  Review of Systems  N/a Physical Exam  BP 118/62 (BP Location: Left Arm, Patient Position: Sitting, Cuff Size: Normal)   Pulse 69   Temp 98.4 F (36.9 C) (Oral)   Ht '5\' 7"'$  (1.702 m)   Wt 208 lb 3.2 oz (94.4 kg)   SpO2 98%   BMI 32.61 kg/m   Wt Readings from Last 5 Encounters:  04/19/22 208 lb 3.2 oz (94.4 kg)  03/23/22 210 lb (95.3 kg)  02/01/22 207 lb (93.9 kg)  12/01/21 209 lb (94.8 kg)  11/15/21 218 lb (98.9 kg)    BMI Readings from Last 5 Encounters:  04/19/22 32.61 kg/m  03/23/22 32.89 kg/m  02/01/22 32.42 kg/m  12/01/21 32.73 kg/m  11/15/21 34.14 kg/m     Physical Exam General: Well-appearing, no acute distress Eyes: EOMI, no icterus Ears: Right tympanic membrane clear with clear normal-appearing fluid, left tympanic membrane bulging with cloudy fluid behind Neck: Supple, no JVP Pulmonary: Clear, normal work of breathing Cardiovascular: Regular rhythm, no murmur Abdomen: Nondistended, bowel sounds present MSK: No synovitis, no joint effusion Neuro: Normal gait, no weakness Psych: Normal mood, full affect   Assessment & Plan:   Dyspnea on exertion: Likely multifactorial and related to deconditioning, chronotropic/inotropic insufficiency in the setting of severe cardiomyopathy EF 20%, possibly uncontrolled asthma.  PFT with CPET 09/2020 with normal spirometry.  Albuterol helps some.  Marked improvement with Stiolto.  To continue.  Likely asthma: Prescribed ICS/LABA versus Advair in the past and did not like taste, did not like rinsing mouth.  Albuterol helps some.   Avoid ICS therapy given  her aversion to rinsing out mouth after every use. DOE much better with [employer].   Lung nodule: Tiny, less than 6  mm seen in 2021.  She does have a history of breast cancer so consider her high risk.  Repeat CT 08/2021 without concern. No further imaging needed.  Eustachian tube dysfunction: Primary on the left.  Suspect allergic, seasonal, but given cloudy appearance of fluid Augmentin twice daily for 10 days for ear infection prescribed.  Increase Flonase to 1 sprays nostril twice a day from daily.  New prescription for azelastine 2 sprays each nostril twice daily sent today.   Return in about 6 months (around 10/19/2022).   Lanier Clam, MD 04/19/2022

## 2022-04-19 NOTE — Patient Instructions (Signed)
Nice to see you again  Take Augmentin 1 tablet twice a day for 10 days for the ear pain and fluid behind the ear  Continue your Flonase but increase to 1 spray each nostril twice a day for 10 days while taking the Augmentin  I sent a new nasal spray and called azelastine, take 2 sprays each nostril twice a day.  The nasal spray that Dr. Jenny Reichmann called in is a different steroid inhaler but similar to Flonase, okay to not take this for now while you are increasing the [place]  Return to clinic in 6 months or sooner as needed with Dr. Silas Flood

## 2022-05-03 ENCOUNTER — Ambulatory Visit (INDEPENDENT_AMBULATORY_CARE_PROVIDER_SITE_OTHER): Payer: Medicare Other

## 2022-05-03 DIAGNOSIS — I428 Other cardiomyopathies: Secondary | ICD-10-CM

## 2022-05-03 LAB — CUP PACEART REMOTE DEVICE CHECK
Battery Remaining Longevity: 80 mo
Battery Voltage: 2.99 V
Brady Statistic RV Percent Paced: 0.01 %
Date Time Interrogation Session: 20231024073733
HighPow Impedance: 38 Ohm
HighPow Impedance: 47 Ohm
Implantable Lead Connection Status: 753985
Implantable Lead Implant Date: 20081215
Implantable Lead Location: 753860
Implantable Lead Model: 6947
Implantable Pulse Generator Implant Date: 20190418
Lead Channel Impedance Value: 323 Ohm
Lead Channel Impedance Value: 399 Ohm
Lead Channel Pacing Threshold Amplitude: 0.75 V
Lead Channel Pacing Threshold Pulse Width: 0.4 ms
Lead Channel Sensing Intrinsic Amplitude: 17.375 mV
Lead Channel Sensing Intrinsic Amplitude: 17.375 mV
Lead Channel Setting Pacing Amplitude: 2.5 V
Lead Channel Setting Pacing Pulse Width: 0.4 ms
Lead Channel Setting Sensing Sensitivity: 0.3 mV
Zone Setting Status: 755011
Zone Setting Status: 755011

## 2022-05-10 NOTE — Progress Notes (Signed)
Patient Care Team: Biagio Borg, MD as PCP - General [facility] Breed Denice Bors, MD as Consulting Physician (Cardiology) Deboraha Sprang, MD as Consulting Physician (Cardiology) Nicholas Lose, MD as Consulting Physician (Hematology and Oncology) Stark Klein, MD as Consulting Physician (General Surgery) Eppie Gibson, MD as Attending Physician (Radiation Oncology)  DIAGNOSIS:  Encounter Diagnosis  Name Primary?   Malignant neoplasm of lower-outer quadrant of right breast of female, estrogen receptor negative ([place]) Yes    SUMMARY OF ONCOLOGIC HISTORY: Oncology History  Malignant neoplasm of lower-outer quadrant of right breast of female, estrogen receptor negative ([place])  08/29/2018 Initial Diagnosis   Screening mammogram detected 2 adjacent masses right breast 8:30 position LOQ 8 cm from nipple 1.1 cm and 5 mm, combined mass 1.7 cm, axilla negative, biopsy ([id number]): Grade 3 IDC triple negative, Ki-67 80%, T1CN0 stage 1B clinical stage   09/05/2018 Cancer Staging   Staging form: Breast, AJCC 8th Edition - Clinical stage from 09/05/2018: Stage IB (cT1c, cN0, cM0, G3, ER-, PR-, HER2-) - Signed by Nicholas Lose, MD on 09/05/2018   10/11/2018 Surgery   Right lumpectomy ([id number]): Grade 3 IDC, 1.6 cm, 0/3 lymph nodes negative, superior margin broadly positive, triple negative, Ki-67 80%, T1CN0 stage Ib. 0/3 lymph nodes positive for carcinoma.   10/16/2018 Cancer Staging   Staging form: Breast, AJCC 8th Edition - Pathologic stage from 10/16/2018: Stage IB (pT1c, pN0, cM0, G3, ER-, PR-, HER2-) - Signed by Nicholas Lose, MD on 10/16/2018   10/23/2018 Surgery   Reexcision [phone]): margins negative for carcinoma   11/15/2018 - 02/28/2019 Chemotherapy   The patient had palonosetron (ALOXI) injection 0.25 mg, 0.25 mg, Intravenous,  Once, 6 of 6 cycles Administration: 0.25 mg (11/15/2018), 0.25 mg (12/06/2018), 0.25 mg (12/27/2018), 0.25 mg (01/17/2019), 0.25 mg (02/07/2019), 0.25 mg (02/28/2019) methotrexate  (PF) chemo injection 65 mg, 84.5 mg, Intravenous,  Once, 6 of 6 cycles Dose modification: 30 mg/m2 (original dose 40 mg/m2, Cycle 2, Reason: Change in SCr/CrCl, Comment: CrCl 53), 25 mg/m2 (original dose 40 mg/m2, Cycle 5, Reason: Dose not tolerated) Administration: 65 mg (11/15/2018), 65 mg (12/06/2018), 65 mg (12/27/2018), 63.25 mg (01/17/2019), 53 mg (02/07/2019), 53 mg (02/28/2019) cyclophosphamide (CYTOXAN) 1,260 mg in sodium chloride 0.9 % 250 mL chemo infusion, 600 mg/m2 = 1,260 mg, Intravenous,  Once, 6 of 6 cycles Dose modification: 500 mg/m2 (original dose 600 mg/m2, Cycle 5, Reason: Provider Judgment) Administration: 1,260 mg (11/15/2018), 1,260 mg (12/06/2018), 1,260 mg (12/27/2018), 1,260 mg (01/17/2019), 1,060 mg (02/07/2019), 1,060 mg (02/28/2019) fluorouracil (ADRUCIL) chemo injection 1,250 mg, 600 mg/m2 = 1,250 mg, Intravenous,  Once, 6 of 6 cycles Dose modification: 400 mg/m2 (original dose 600 mg/m2, Cycle 5, Reason: Dose not tolerated) Administration: 1,250 mg (11/15/2018), 1,250 mg (12/06/2018), 1,250 mg (12/27/2018), 1,250 mg (01/17/2019), 850 mg (02/07/2019), 850 mg (02/28/2019)  for chemotherapy treatment.    03/26/2019 - 04/19/2019 Radiation Therapy   40.05 Gy in 15 fractions to the right breast with 2 tangential fields. Followed by a boost.     CHIEF COMPLIANT: Follow-up of history of breast cancer    INTERVAL HISTORY: [name] [name] is a 81 y.o. with above-mentioned history of triple negative breast cancer is currently on surveillance. Mammogram on 09/22/2020 showed no evidence of malignancy bilaterally. She presents to the clinic today for follow-up. She states that she is doing good. She says the fatigue has improved she was prescribed a new inhaler. She does report some pain in the breast.   ALLERGIES:  is allergic to topamax [  topiramate] and zocor [simvastatin - high dose].  MEDICATIONS:  Current Outpatient Medications  Medication Sig Dispense Refill   azithromycin (ZITHROMAX) 250 MG  tablet Take by mouth.     albuterol (PROVENTIL HFA;VENTOLIN HFA) 108 (90 Base) MCG/ACT inhaler Inhale 1 puff into the lungs every 6 (six) hours as needed for wheezing or shortness of breath.     azelastine (ASTELIN) 0.1 % nasal spray Place 2 sprays into both nostrils 2 (two) times daily. Use in each nostril as directed 30 mL 2   benzonatate (TESSALON PERLES) 100 MG capsule 1-2 tab by mouth evry 8 hr as needed 40 capsule 2   carvedilol (COREG) 25 MG tablet TAKE ONE-HALF TABLET BY MOUTH  TWICE DAILY 90 tablet 3   cholecalciferol (VITAMIN D3) 25 MCG (1000 UT) tablet Take 1,000 Units by mouth daily.     dapagliflozin propanediol (FARXIGA) 10 MG TABS tablet Take 1 tablet (10 mg total) by mouth daily before breakfast. 30 tablet 11   ENTRESTO 24-26 MG TAKE 1 TABLET BY MOUTH TWICE A DAY 60 tablet 5   furosemide (LASIX) 40 MG tablet TAKE 1 TABLET BY MOUTH TWICE  DAILY 180 tablet 3   lidocaine-prilocaine (EMLA) cream APPLY TO AFFECTED AREA ONCE AS DIRECTED 30 g 3   meloxicam (MOBIC) 15 MG tablet TAKE 1 TABLET BY MOUTH EVERY DAY AS NEEDED FOR PAIN 90 tablet 3   montelukast (SINGULAIR) 10 MG tablet Take 1 tablet (10 mg total) by mouth at bedtime. 30 tablet 3   Multiple Vitamin (MULTIVITAMIN WITH MINERALS) TABS tablet Take 1 tablet by mouth daily.     nitroGLYCERIN (NITROLINGUAL) 0.4 MG/SPRAY spray Place 1 spray under the tongue every 5 (five) minutes x 3 doses as needed for chest pain. 12 g 3   rizatriptan (MAXALT) 10 MG tablet Take 10 mg by mouth as needed for migraine. May repeat in 2 hours if needed     rosuvastatin (CRESTOR) 20 MG tablet Take 1 tablet (20 mg total) by mouth daily. 90 tablet 3   scopolamine (TRANSDERM-SCOP, 1.5 MG,) 1 MG/3DAYS Place 1 patch (1.5 mg total) onto the skin every 3 (three) days. 10 patch 0   spironolactone (ALDACTONE) 25 MG tablet TAKE ONE-HALF TABLET BY MOUTH  DAILY 45 tablet 3   Tiotropium Bromide-Olodaterol (STIOLTO RESPIMAT) 2.5-2.5 MCG/ACT AERS Inhale 2 puffs into the lungs  daily. 1 each 11   tiZANidine (ZANAFLEX) 2 MG tablet TAKE 1 TABLET BY MOUTH EVERY 6 HOURS AS NEEDED FOR MUSCLE SPASMS. 40 tablet 1   traMADol (ULTRAM) 50 MG tablet Take 1 tablet (50 mg total) by mouth every 6 (six) hours as needed. 120 tablet 2   triamcinolone (NASACORT) 55 MCG/ACT AERO nasal inhaler Place 2 sprays into the nose daily. 1 each 12   triamcinolone cream (KENALOG) 0.1 % APPLY TO AFFECTED AREA TWICE A DAY 30 g 0   No current facility-administered medications for this visit.    PHYSICAL EXAMINATION: ECOG PERFORMANCE STATUS: 1 - Symptomatic but completely ambulatory  Vitals:   05/17/22 1456  BP: 126/60  Pulse: 90  Resp: 18  Temp: (!) 97.5 F (36.4 C)  SpO2: 96%   Filed Weights   05/17/22 1456  Weight: 208 lb 9.6 oz (94.6 kg)    BREAST: No palpable masses or nodules in either right or left breasts. No palpable axillary supraclavicular or infraclavicular adenopathy no breast tenderness or nipple discharge. (exam performed in the presence of a chaperone)  LABORATORY DATA:  I have reviewed the data  as listed    Latest Ref Rng & Units 12/01/2021    3:08 PM 09/27/2021    9:12 AM 04/03/2020    4:35 PM  CMP  Glucose 70 - 99 mg/dL 102  90  100   BUN 6 - 23 mg/dL _0 Creatinine 0.40 - 1.20 mg/dL 1.56  1.58  1.49   Sodium 135 - 145 mEq/L 137  141  141   Potassium 3.5 - 5.1 mEq/L 4.2  5.2  4.6   Chloride 96 - 112 mEq/L 101  104  102   CO2 19 - 32 mEq/L _1 Calcium 8.4 - 10.5 mg/dL 9.7  9.5  9.5   Total Protein 6.0 - 8.3 g/dL 7.8     Total Bilirubin 0.2 - 1.2 mg/dL 0.4     Alkaline Phos 39 - 117 U/L 77     AST 0 - 37 U/L 18     ALT 0 - 35 U/L 17       Lab Results  Component Value Date   WBC 6.6 12/01/2021   HGB 11.8 (L) 12/01/2021   HCT 37.4 12/01/2021   MCV 81.2 12/01/2021   PLT 252.0 12/01/2021   NEUTROABS 2.6 12/01/2021    ASSESSMENT & PLAN:  Malignant neoplasm of lower-outer quadrant of right breast of female, estrogen receptor negative  ([place]) 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%, T1CN0 stage Ib   Review: Superior margin was broadly positive: Patient had surgery 10/23/2018: Benign Peripheral blood flow cytometry: Monoclonal B-cell population kappa restricted lymphocytes CD20 positive but lacks CD5, CD10, CD23 or CD103: B-cell lymphoproliferative process   Treatment plan: 1.  Adjuvant chemotherapy with CMF x6 cycles 11/15/2018-02/28/2019 2.  Followed by adjuvant radiation 03/26/2019-04/19/2019 ----------------------------------------------------------------------------------------------------------------------------------------------------- Breast cancer surveillance: 1.  Breast exam 05/17/2022 benign, tenderness in the right and left breast without any palpable nodularity. 2.  Mammogram  09/27/2021: [facility]: Benign breast density category B   Severe fatigue: Significantly improved with changing her inhalers.   Return to clinic in 1 year for follow-up    No orders of the defined types were placed in this encounter.  The patient has a good understanding of the overall plan. she agrees with it. she will call with any problems that may develop before the next visit here. Total time spent: 30 mins including face to face time and time spent for planning, charting and co-ordination of care   Harriette Ohara, MD 05/17/22    I Gardiner Coins am scribing for Dr. Lindi Adie  I have reviewed the above documentation for accuracy and completeness, and I agree with the above.

## 2022-05-11 ENCOUNTER — Other Ambulatory Visit: Payer: Self-pay | Admitting: Internal Medicine

## 2022-05-17 ENCOUNTER — Inpatient Hospital Stay: Payer: Medicare Other | Attending: Hematology and Oncology | Admitting: Hematology and Oncology

## 2022-05-17 ENCOUNTER — Other Ambulatory Visit: Payer: Self-pay

## 2022-05-17 VITALS — BP 126/60 | HR 90 | Temp 97.5°F | Resp 18 | Ht 67.0 in | Wt 208.6 lb

## 2022-05-17 DIAGNOSIS — R5383 Other fatigue: Secondary | ICD-10-CM | POA: Diagnosis not present

## 2022-05-17 DIAGNOSIS — Z923 Personal history of irradiation: Secondary | ICD-10-CM | POA: Insufficient documentation

## 2022-05-17 DIAGNOSIS — Z791 Long term (current) use of non-steroidal anti-inflammatories (NSAID): Secondary | ICD-10-CM | POA: Insufficient documentation

## 2022-05-17 DIAGNOSIS — Z9221 Personal history of antineoplastic chemotherapy: Secondary | ICD-10-CM | POA: Insufficient documentation

## 2022-05-17 DIAGNOSIS — Z171 Estrogen receptor negative status [ER-]: Secondary | ICD-10-CM | POA: Diagnosis not present

## 2022-05-17 DIAGNOSIS — Z7984 Long term (current) use of oral hypoglycemic drugs: Secondary | ICD-10-CM | POA: Insufficient documentation

## 2022-05-17 DIAGNOSIS — Z79899 Other long term (current) drug therapy: Secondary | ICD-10-CM | POA: Insufficient documentation

## 2022-05-17 DIAGNOSIS — C50511 Malignant neoplasm of lower-outer quadrant of right female breast: Secondary | ICD-10-CM | POA: Insufficient documentation

## 2022-05-17 NOTE — Assessment & Plan Note (Signed)
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  Adjuvant chemotherapy with CMF x6 cycles 11/15/2018-02/28/2019 2.  Followed by adjuvant radiation 03/26/2019-04/19/2019 ----------------------------------------------------------------------------------------------------------------------------------------------------- Breast cancer surveillance: 1.  Breast exam 05/17/2022 benign, tenderness in the right and left breast without any palpable nodularity. 2.  Mammogram  09/27/2021: [facility]: Benign breast density category B   Severe fatigue: B12 did not make a difference.   Return to clinic in 1 year for follow-up

## 2022-05-18 ENCOUNTER — Telehealth: Payer: Self-pay | Admitting: Hematology and Oncology

## 2022-05-18 NOTE — Telephone Encounter (Signed)
Scheduled appointment per 11/7 los. Left voicemail.

## 2022-05-23 NOTE — Progress Notes (Signed)
Remote ICD transmission.   

## 2022-06-01 ENCOUNTER — Other Ambulatory Visit: Payer: Self-pay | Admitting: Internal Medicine

## 2022-06-07 ENCOUNTER — Ambulatory Visit (INDEPENDENT_AMBULATORY_CARE_PROVIDER_SITE_OTHER): Payer: Medicare Other

## 2022-06-07 ENCOUNTER — Encounter: Payer: Self-pay | Admitting: Internal Medicine

## 2022-06-07 ENCOUNTER — Ambulatory Visit: Payer: Medicare Other | Admitting: Internal Medicine

## 2022-06-07 ENCOUNTER — Ambulatory Visit: Payer: Self-pay

## 2022-06-07 ENCOUNTER — Ambulatory Visit: Payer: Medicare Other | Admitting: Family Medicine

## 2022-06-07 ENCOUNTER — Ambulatory Visit (INDEPENDENT_AMBULATORY_CARE_PROVIDER_SITE_OTHER): Payer: Medicare Other | Admitting: Internal Medicine

## 2022-06-07 VITALS — BP 126/80 | HR 71 | Ht 67.0 in | Wt 210.0 lb

## 2022-06-07 VITALS — BP 126/80 | HR 66 | Temp 98.0°F | Ht 67.0 in | Wt 210.0 lb

## 2022-06-07 DIAGNOSIS — G8929 Other chronic pain: Secondary | ICD-10-CM | POA: Diagnosis not present

## 2022-06-07 DIAGNOSIS — M25531 Pain in right wrist: Secondary | ICD-10-CM | POA: Diagnosis not present

## 2022-06-07 DIAGNOSIS — J019 Acute sinusitis, unspecified: Secondary | ICD-10-CM | POA: Diagnosis not present

## 2022-06-07 DIAGNOSIS — R739 Hyperglycemia, unspecified: Secondary | ICD-10-CM | POA: Diagnosis not present

## 2022-06-07 DIAGNOSIS — E559 Vitamin D deficiency, unspecified: Secondary | ICD-10-CM

## 2022-06-07 DIAGNOSIS — M545 Low back pain, unspecified: Secondary | ICD-10-CM

## 2022-06-07 MED ORDER — TIZANIDINE HCL 2 MG PO TABS: 4.0000 mg | ORAL_TABLET | Freq: Four times a day (QID) | ORAL | 2 refills | Status: DC | PRN

## 2022-06-07 MED ORDER — LEVOFLOXACIN 500 MG PO TABS: 500.0000 mg | ORAL_TABLET | Freq: Every day | ORAL | 0 refills | Status: AC

## 2022-06-07 NOTE — Progress Notes (Signed)
I, [name] [name], LAT, ATC acting as a scribe for [name] Leader, MD.  Subjective:    CC: R wrist pain  HPI: Pt is a 81 y/o female c/o R wrist pain ongoing since 11/7 after suffering a fall. Pt was defrosting a freezer, when a piece of the ice fell off, she stepped on the ice, and slipped, w/ her R wrist hitting on a table as she was falling to the ground. Pt locates pain to dorsal aspect of the R wrist along the carpals.  The wrist was forced into flexion during the fall.  R wrist swelling: slight- or "bump" Grip strength: normal Aggravates: wrist flexion Treatments tried: Epsom salt soaks, alcohol, cream  Pertinent review of Systems: No fevers or chills  Relevant historical information: Nonischemic cardiomyopathy    Objective:    Vitals:   06/07/22 0926  BP: 126/80  Pulse: 71  SpO2: 99%   General: Well Developed, well nourished, and in no acute distress.   MSK: Right wrist: Visible nodule dorsal mid wrist.  Nodule is palpable and mildly tender to palpation.  Nodule partially mobile and associated with the fourth dorsal wrist compartment. Intact range of motion.  Intact strength to the fingers and wrist.  Some pain is present with resisted wrist extension and finger extension. Grip strength is intact. Pulses capillary fill and sensation are intact distally.  Lab and Radiology Results  Diagnostic Limited MSK Ultrasound of: Right wrist dorsal Dorsal nodule visualized.  Is associated with the superficial portion of the fourth dorsal wrist compartment.  It is not entirely cystic and appears to be consistent with the partially torn and coiled extensor tendon. Impression: Dorsal wrist nodule thought to be partial tendon tear.   X-ray images right wrist obtained today personally and independently interpreted Rounded avulsion fragment present at distal ulna.  First CMC DJD.  No acute fractures are visible. Await formal radiology review  Impression and Recommendations:     Assessment and Plan: 81 y.o. female with right wrist pain and nodule dorsal wrist following a fall where her wrist was forced into flexion.  Her wrist is functional with normal range of motion and intact strength.  She may have suffered a partial tendon tear but functionally she is doing pretty well.  She does have some continued pain.  I think she will improve with conservative management.  Will use a wrist brace and referral to occupational hand therapy.  Recheck in 1 month.  If not better we can consider an injection.  She would like very much to avoid injection and surgery at this time so conservative management is indicated.[name]  PDMP not reviewed this encounter. Orders Placed This Encounter  Procedures   DG Wrist Complete Right    Standing Status:   Future    Number of Occurrences:   1    Standing Expiration Date:   07/07/2022    Order Specific Question:   Reason for Exam (SYMPTOM  OR DIAGNOSIS REQUIRED)    Answer:   right wrist pain    Order Specific Question:   Preferred imaging location?    Answer:   [facility]   [place] LIMITED JOINT SPACE STRUCTURES UP RIGHT(NO LINKED CHARGES)    Order Specific Question:   Reason for Exam (SYMPTOM  OR DIAGNOSIS REQUIRED)    Answer:   rt wrist    Order Specific Question:   Preferred imaging location?    Answer:   [facility]   Ambulatory referral to Occupational  Therapy    Referral Priority:   Routine    Referral Type:   Occupational Therapy    Referral Reason:   Specialty Services Required    Requested Specialty:   Occupational Therapy    Number of Visits Requested:   1   No orders of the defined types were placed in this encounter.   Discussed warning signs or symptoms. Please see discharge instructions. Patient expresses understanding.   The above documentation has been reviewed and is accurate and complete [name] [name], M.D.

## 2022-06-07 NOTE — Progress Notes (Signed)
Patient ID: [name] [name], female   DOB: 1940-10-31, 81 y.o.   MRN: [id number]        Chief Complaint: follow up sinus pain, hld, chronic lbp, hyperglycemia       HPI:  [name] [name] is a 81 y.o. female  Here with 2-3 days acute onset fever, facial pain, pressure, headache, general weakness and malaise, and greenish d/c, with mild ST and cough, but pt denies chest pain, wheezing, increased sob or doe, orthopnea, PND, increased LE swelling, palpitations, dizziness or syncope.  Temp has been 102, less today.  Pt continues to have recurring LBP without change in severity, bowel or bladder change, fever, wt loss,  worsening LE pain/numbness/weakness, gait change or falls, but normally takes 4 mg tizanidine to keep the muscle spasm down. Tolerating crestor otherwise without difficulty.   Pt denies polydipsia, polyuria, or new focal neuro s/s.   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   GERD 05/07/2007   History of kidney stones    HYPERLIPIDEMIA 05/23/2007   HYPERTENSION 05/07/2007   KELOID SCAR, 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   MENOPAUSAL DISORDER 05/06/2008   OSTEOARTHRITIS, KNEES, 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   OVERACTIVE BLADDER 08/03/2010   PYELONEPHRITIS, HX OF 09/62/8366   SYSTOLIC HEART FAILURE, CHRONIC 12/28/2008   VITAMIN D DEFICIENCY 06/17/2008   Past Surgical History:  Procedure Laterality Date   ABDOMINAL HYSTERECTOMY     BREAST LUMPECTOMY WITH RADIOACTIVE SEED AND SENTINEL LYMPH NODE BIOPSY Right 10/11/2018    Procedure: RIGHT BREAST LUMPECTOMY WITH RADIOACTIVE SEED AND SENTINEL LYMPH NODE BIOPSY;  Surgeon: Stark Klein, MD;  Location: [facility];  Service: General;  Laterality: Right;   ICD GENERATOR CHANGEOUT N/A 10/25/2017   Procedure: ICD [place];  Surgeon: Deboraha Sprang, MD;  Location: [facility];  Service: Cardiovascular;  Laterality: N/A;   LAMINECTOMY     PORT-A-CATH REMOVAL N/A 05/08/2019   Procedure: REMOVAL OF PORT-A-CATH RIGHT CHEST;  Surgeon: Stark Klein, MD;  Location: [facility];  Service: General;  Laterality: N/A;   PORTACATH PLACEMENT N/A 10/11/2018   Procedure: INSERTION PORT-A-CATH;  Surgeon: Stark Klein, MD;  Location: [facility];  Service: General;  Laterality: N/A;   RE-EXCISION OF BREAST LUMPECTOMY Right 10/23/2018   Procedure: RE-EXCISION OF RIGHT BREAST LUMPECTOMY;  Surgeon: Stark Klein, MD;  Location: [facility];  Service: General;  Laterality: Right;    reports that she has never smoked. She has never used smokeless tobacco. She reports that she does not drink alcohol and does not use drugs. family history includes Arthritis in an other family member; Coronary artery disease in an other family member; Heart disease in her mother; Kidney disease in an other family member. Allergies  Allergen Reactions   Topamax [Topiramate] Other (See Comments)    Confusion, gets lost going home   Zocor [Simvastatin - High Dose] Nausea Only    Nausea    Current Outpatient Medications on File Prior to Visit  Medication Sig Dispense Refill  albuterol (PROVENTIL HFA;VENTOLIN HFA) 108 (90 Base) MCG/ACT inhaler Inhale 1 puff into the lungs every 6 (six) hours as needed for wheezing or shortness of breath.     azelastine (ASTELIN) 0.1 % nasal spray Place 2 sprays into both nostrils 2 (two) times daily. Use in each nostril as directed 30 mL 2   benzonatate (TESSALON PERLES) 100 MG capsule 1-2 tab by mouth evry 8 hr as needed 40 capsule 2   carvedilol (COREG) 25 MG tablet TAKE ONE-HALF  TABLET BY MOUTH  TWICE DAILY 90 tablet 3   cholecalciferol (VITAMIN D3) 25 MCG (1000 UT) tablet Take 1,000 Units by mouth daily.     dapagliflozin propanediol (FARXIGA) 10 MG TABS tablet Take 1 tablet (10 mg total) by mouth daily before breakfast. 30 tablet 11   ENTRESTO 24-26 MG TAKE 1 TABLET BY MOUTH TWICE A DAY 60 tablet 5   furosemide (LASIX) 40 MG tablet TAKE 1 TABLET BY MOUTH TWICE  DAILY 180 tablet 3   lidocaine-prilocaine (EMLA) cream APPLY TO AFFECTED AREA ONCE AS DIRECTED 30 g 3   meloxicam (MOBIC) 15 MG tablet TAKE 1 TABLET BY MOUTH EVERY DAY AS NEEDED FOR PAIN 90 tablet 3   montelukast (SINGULAIR) 10 MG tablet Take 1 tablet (10 mg total) by mouth at bedtime. 30 tablet 3   Multiple Vitamin (MULTIVITAMIN WITH MINERALS) TABS tablet Take 1 tablet by mouth daily.     nitroGLYCERIN (NITROLINGUAL) 0.4 MG/SPRAY spray Place 1 spray under the tongue every 5 (five) minutes x 3 doses as needed for chest pain. 12 g 3   rizatriptan (MAXALT) 10 MG tablet Take 10 mg by mouth as needed for migraine. May repeat in 2 hours if needed     rosuvastatin (CRESTOR) 20 MG tablet Take 1 tablet (20 mg total) by mouth daily. 90 tablet 3   scopolamine (TRANSDERM-SCOP, 1.5 MG,) 1 MG/3DAYS Place 1 patch (1.5 mg total) onto the skin every 3 (three) days. 10 patch 0   spironolactone (ALDACTONE) 25 MG tablet TAKE ONE-HALF TABLET BY MOUTH  DAILY 45 tablet 3   Tiotropium Bromide-Olodaterol (STIOLTO RESPIMAT) 2.5-2.5 MCG/ACT AERS Inhale 2 puffs into the lungs daily. 1 each 11   traMADol (ULTRAM) 50 MG tablet Take 1 tablet (50 mg total) by mouth every 6 (six) hours as needed. 120 tablet 2   triamcinolone (NASACORT) 55 MCG/ACT AERO nasal inhaler Place 2 sprays into the nose daily. 1 each 12   triamcinolone cream (KENALOG) 0.1 % APPLY TO AFFECTED AREA TWICE A DAY 30 g 0   No current facility-administered medications on file prior to visit.        ROS:  All others reviewed and negative.  Objective        PE:  BP 126/80  (BP Location: Left Arm, Patient Position: Sitting, Cuff Size: Large)   Pulse 66   Temp 98 F (36.7 C) (Oral)   Ht '5\' 7"'$  (1.702 m)   Wt 210 lb (95.3 kg)   SpO2 99%   BMI 32.89 kg/m                 Constitutional: Pt appears in NAD               HENT: Head: NCAT.                Right Ear: External ear normal.                 Left Ear: External ear normal.  Bilat tm's with mild erythema.  Max sinus areas mild tender.  Pharynx with mild erythema, no exudate                Eyes: . Pupils are equal, round, and reactive to light. Conjunctivae and EOM are normal               Nose: without d/c or deformity               Neck: Neck supple. Gross normal ROM               Cardiovascular: Normal rate and regular rhythm.                 Pulmonary/Chest: Effort normal and breath sounds without rales or wheezing.                               Neurological: Pt is alert. At baseline orientation, motor grossly intact               Skin: Skin is warm. No rashes, no other new lesions, LE edema - none               Psychiatric: Pt behavior is normal without agitation   Micro: none  Cardiac tracings I have personally interpreted today:  none  Pertinent Radiological findings (summarize): none   Lab Results  Component Value Date   WBC 6.6 12/01/2021   HGB 11.8 (L) 12/01/2021   HCT 37.4 12/01/2021   PLT 252.0 12/01/2021   GLUCOSE 102 (H) 12/01/2021   CHOL 187 12/01/2021   TRIG 105.0 12/01/2021   HDL 42.90 12/01/2021   LDLDIRECT 129.7 02/07/2013   LDLCALC 123 (H) 12/01/2021   ALT 17 12/01/2021   AST 18 12/01/2021   NA 137 12/01/2021   K 4.2 12/01/2021   CL 101 12/01/2021   CREATININE 1.56 (H) 12/01/2021   BUN 23 12/01/2021   CO2 27 12/01/2021   TSH 0.80 12/01/2021   INR 0.9 RATIO 06/18/2007   HGBA1C 6.1 12/01/2021   MICROALBUR 2.3 (H) 12/01/2021   Assessment/Plan:  [name] [name] is a 81 y.o. Black or African American [2] female with  has a past medical history of AICD (automatic  cardioverter/defibrillator) present, ALLERGIC RHINITIS (11/06/2007), ANEMIA-IRON DEFICIENCY (05/23/2007), ANEMIA-NOS (06/17/2008), ANXIETY (02/22/2008), CARDIOMYOPATHY, PRIMARY, DILATED (12/30/2008), DEGENERATIVE DISC DISEASE, LUMBAR SPINE (05/07/2007), GERD (05/07/2007), History of kidney stones, HYPERLIPIDEMIA (05/23/2007), HYPERTENSION (05/07/2007), KELOID SCAR, HX OF (02/02/2009), MENOPAUSAL DISORDER (05/06/2008), OSTEOARTHRITIS, KNEES, BILATERAL (05/23/2007), OVERACTIVE BLADDER (08/03/2010), PYELONEPHRITIS, HX OF (81/85/6314), SYSTOLIC HEART FAILURE, CHRONIC (12/28/2008), and VITAMIN D DEFICIENCY (06/17/2008).  Hyperglycemia Lab Results  Component Value Date   HGBA1C 6.1 12/01/2021   Stable, pt to continue current medical treatment farxiga 10 mg qd   Acute sinusitis Mild to mod, for antibx course, cough med prn, declines cxr,  to f/u any worsening symptoms or concerns  Chronic low back pain Uncontrolled, ok for increased tizanidine 4 mg prn  Vitamin D deficiency Last vitamin D Lab Results  Component Value Date   VD25OH 28.99 (L) 12/01/2021   Low, reminded to start oral replacement   Followup: Return in about 6 months (around 12/06/2022).  Cathlean Cower, MD 06/07/2022 11:46 AM [facility]

## 2022-06-07 NOTE — Patient Instructions (Signed)
Please take all new medication as prescribed - the antibiotic  Ok to take 2 of the muscle relaxers as needed  Please continue all other medications as before, and refills have been done if requested.  Please have the pharmacy call with any other refills you may need.  Please continue your efforts at being more active, low cholesterol diet, and weight control  Please keep your appointments with your specialists as you may have planned  We can hold on blood testing today  Please make an Appointment to return in 6 months, or sooner if needed

## 2022-06-07 NOTE — Assessment & Plan Note (Signed)
Lab Results  Component Value Date   HGBA1C 6.1 12/01/2021   Stable, pt to continue current medical treatment farxiga 10 mg qd

## 2022-06-07 NOTE — Assessment & Plan Note (Signed)
Mild to mod, for antibx course, cough med prn, declines cxr,  to f/u any worsening symptoms or concerns

## 2022-06-07 NOTE — Assessment & Plan Note (Signed)
Uncontrolled, ok for increased tizanidine 4 mg prn

## 2022-06-07 NOTE — Patient Instructions (Signed)
Thank you for coming in today.   Please use Voltaren gel (Generic Diclofenac Gel) up to 4x daily for pain as needed.  This is available over-the-counter as both the name brand Voltaren gel and the generic diclofenac gel.   Ok to use a basic over the counter wrist brace.   Plan for hand therapy.   Recheck in 6 weeks. If not better or if you are getting worse we have more to do.

## 2022-06-07 NOTE — Assessment & Plan Note (Signed)
Last vitamin D Lab Results  Component Value Date   VD25OH 28.99 (L) 12/01/2021   Low, reminded to start oral replacement

## 2022-06-08 NOTE — Progress Notes (Signed)
Right wrist x-ray looks normal to radiology.  No fracture is visible.  They do mention a little bony piece that we saw on your x-ray that we talked about.  They do not think it is a new fracture at all.

## 2022-06-12 ENCOUNTER — Other Ambulatory Visit: Payer: Self-pay | Admitting: Internal Medicine

## 2022-06-12 DIAGNOSIS — C50511 Malignant neoplasm of lower-outer quadrant of right female breast: Secondary | ICD-10-CM

## 2022-06-29 ENCOUNTER — Other Ambulatory Visit: Payer: Self-pay | Admitting: Internal Medicine

## 2022-06-29 DIAGNOSIS — I5022 Chronic systolic (congestive) heart failure: Secondary | ICD-10-CM

## 2022-07-09 ENCOUNTER — Other Ambulatory Visit: Payer: Self-pay | Admitting: Cardiology

## 2022-07-09 DIAGNOSIS — I428 Other cardiomyopathies: Secondary | ICD-10-CM

## 2022-07-19 ENCOUNTER — Ambulatory Visit: Payer: Medicare Other | Admitting: Family Medicine

## 2022-07-22 ENCOUNTER — Other Ambulatory Visit: Payer: Self-pay | Admitting: Cardiology

## 2022-07-22 DIAGNOSIS — I428 Other cardiomyopathies: Secondary | ICD-10-CM

## 2022-07-28 ENCOUNTER — Other Ambulatory Visit: Payer: Self-pay | Admitting: Internal Medicine

## 2022-07-28 ENCOUNTER — Other Ambulatory Visit: Payer: Self-pay | Admitting: Cardiology

## 2022-07-28 NOTE — Telephone Encounter (Signed)
Please refill as per office routine med refill policy (all routine meds to be refilled for 3 mo or monthly (per pt preference) up to one year from last visit, then month to month grace period for 3 mo, then further med refills will have to be denied)

## 2022-08-02 ENCOUNTER — Ambulatory Visit: Payer: Medicare Other | Attending: Internal Medicine

## 2022-08-02 DIAGNOSIS — I428 Other cardiomyopathies: Secondary | ICD-10-CM

## 2022-08-02 LAB — CUP PACEART REMOTE DEVICE CHECK
Battery Remaining Longevity: 77 mo
Battery Voltage: 2.97 V
Brady Statistic RV Percent Paced: 0.01 %
Date Time Interrogation Session: 20240123102310
HighPow Impedance: 38 Ohm
HighPow Impedance: 50 Ohm
Implantable Lead Connection Status: 753985
Implantable Lead Implant Date: 20081215
Implantable Lead Location: 753860
Implantable Lead Model: 6947
Implantable Pulse Generator Implant Date: 20190418
Lead Channel Impedance Value: 342 Ohm
Lead Channel Impedance Value: 399 Ohm
Lead Channel Pacing Threshold Amplitude: 0.875 V
Lead Channel Pacing Threshold Pulse Width: 0.4 ms
Lead Channel Sensing Intrinsic Amplitude: 19.625 mV
Lead Channel Sensing Intrinsic Amplitude: 19.625 mV
Lead Channel Setting Pacing Amplitude: 2.5 V
Lead Channel Setting Pacing Pulse Width: 0.4 ms
Lead Channel Setting Sensing Sensitivity: 0.3 mV
Zone Setting Status: 755011
Zone Setting Status: 755011

## 2022-08-10 ENCOUNTER — Other Ambulatory Visit: Payer: Self-pay | Admitting: Internal Medicine

## 2022-08-10 ENCOUNTER — Other Ambulatory Visit: Payer: Self-pay | Admitting: Pulmonary Disease

## 2022-08-18 ENCOUNTER — Ambulatory Visit: Payer: Medicare Other | Admitting: Family Medicine

## 2022-08-22 IMAGING — CT CT CHEST HIGH RESOLUTION W/O CM
2 of 7 series · 14 of 36 positions shown, 17 images · non-contrast
Comparison: None.

CLINICAL DATA: Persistent shortness of breath, possible ILD

EXAM:
CT CHEST WITHOUT CONTRAST
TECHNIQUE: Multidetector CT imaging of the chest was performed following the
standard protocol without intravenous contrast. High resolution
imaging of the lungs, as well as inspiratory and expiratory imaging,
was performed.

[Series 4: high resolution · axial · 0.71mm/px · z∈[-373,-93]mm · 11 of 338 slices shown, 14 images]
[im 29/338  mediastinal]
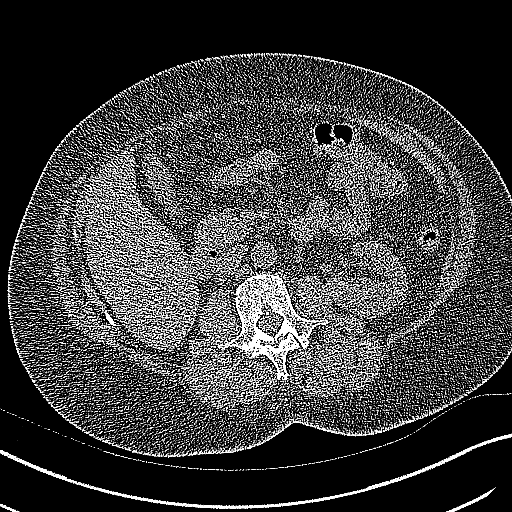
[im 29/338  lung]
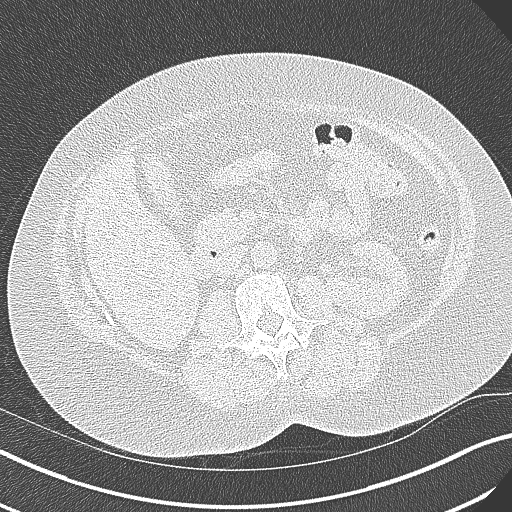
[im 57/338  lung]
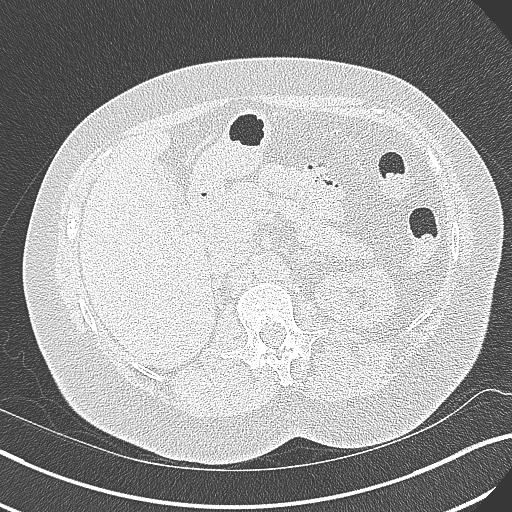
[im 85/338  lung]
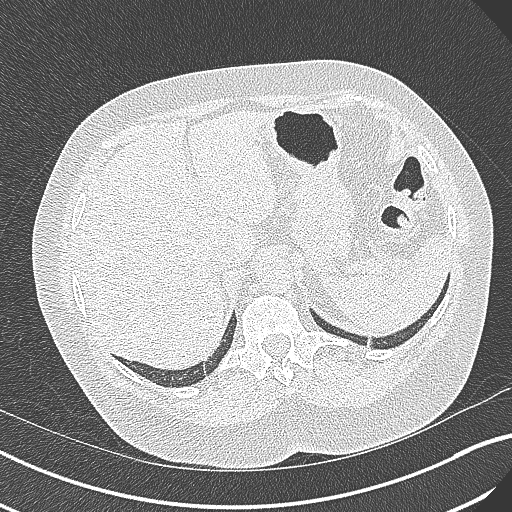
[im 113/338  lung]
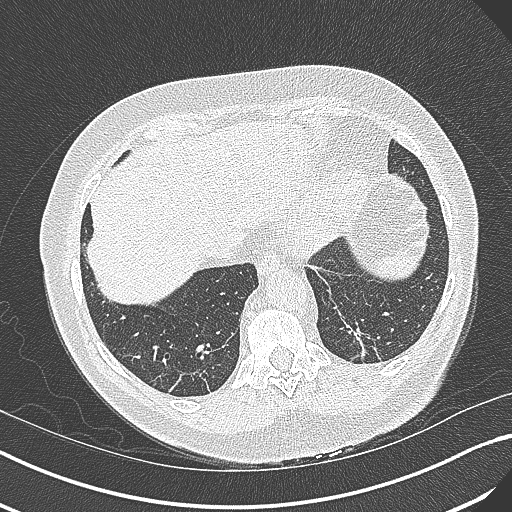
[im 141/338  mediastinal]
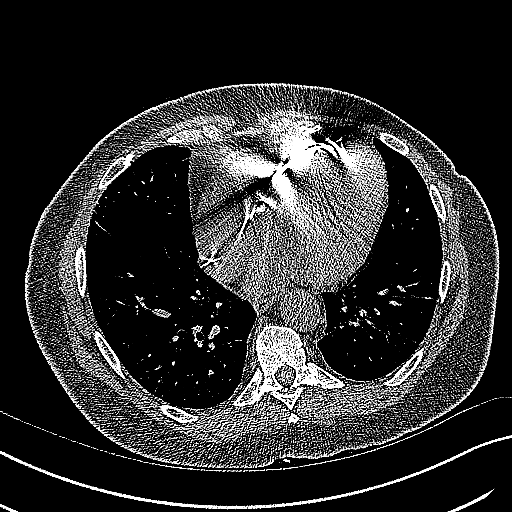
[im 141/338  lung]
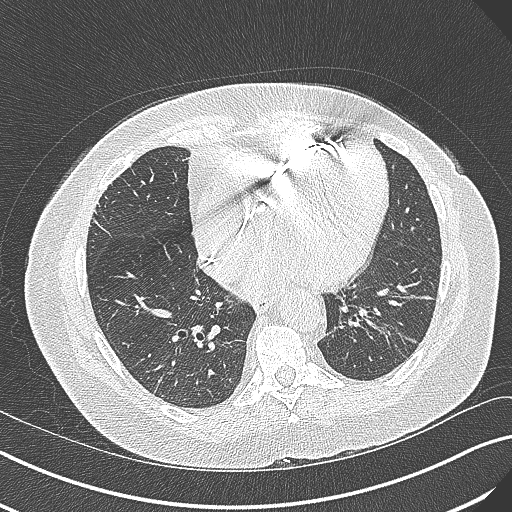
[im 169/338  lung]
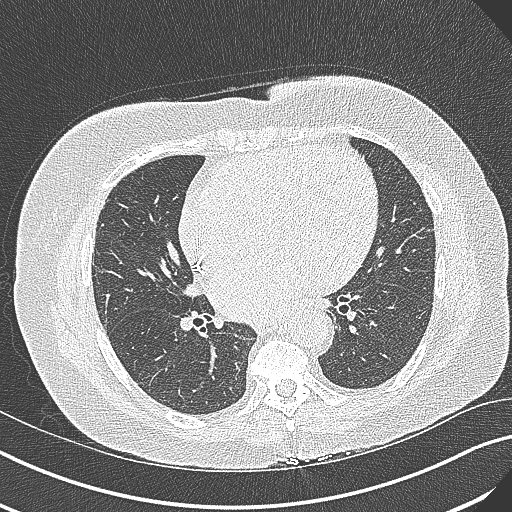
[im 197/338  lung]
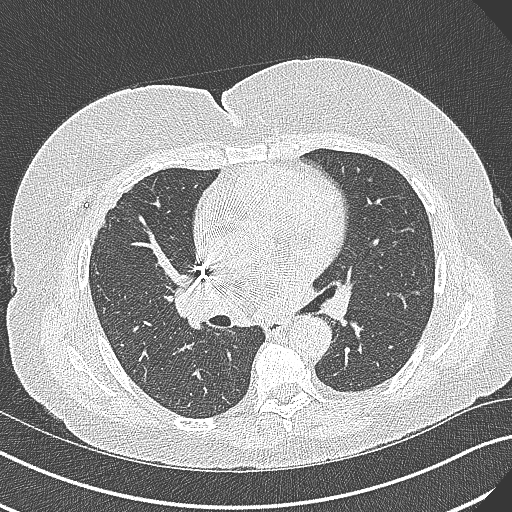
[im 225/338  lung]
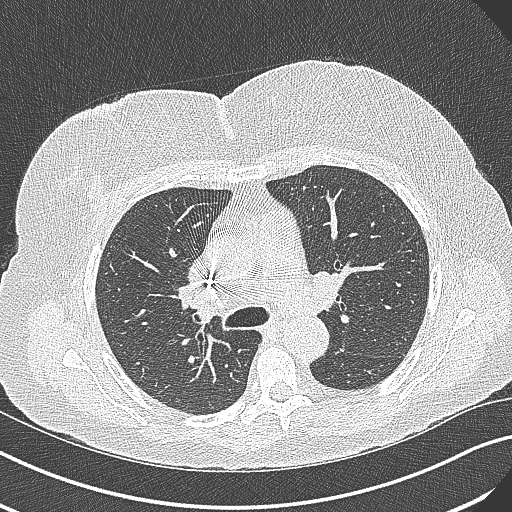
[im 253/338  mediastinal]
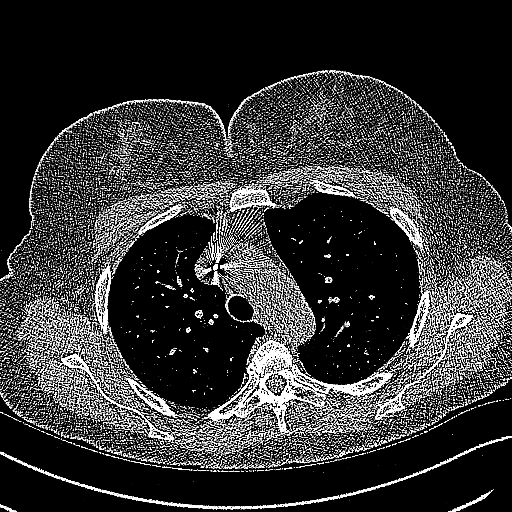
[im 253/338  lung]
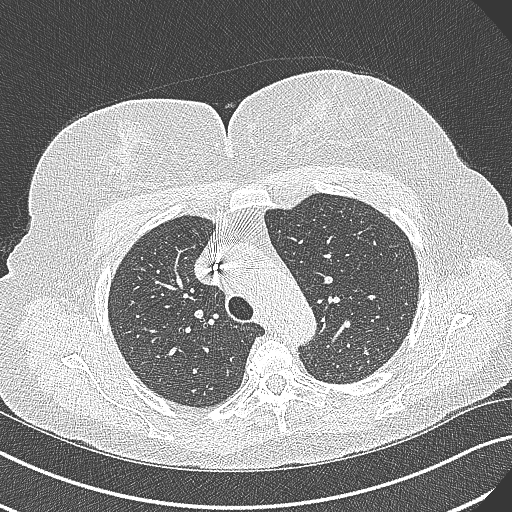
[im 281/338  lung]
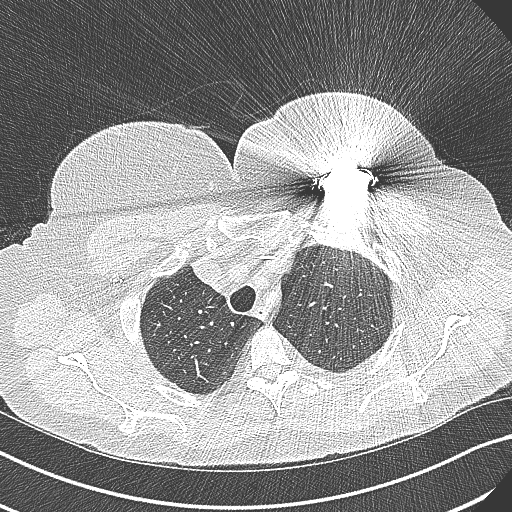
[im 309/338  lung]
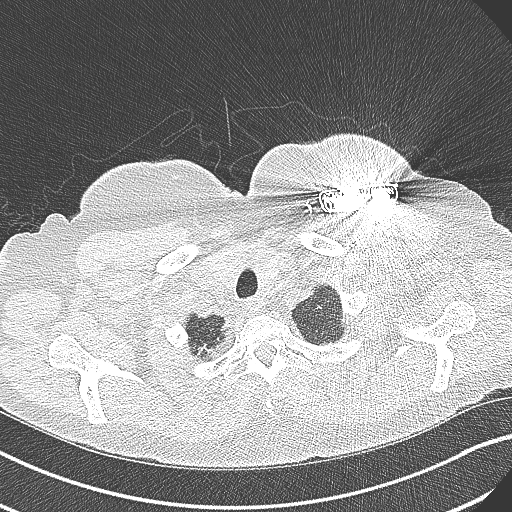

[Series 6: coronal · coronal · 0.66mm/px · 3 of 136 slices shown]
[im 28/136  lung]
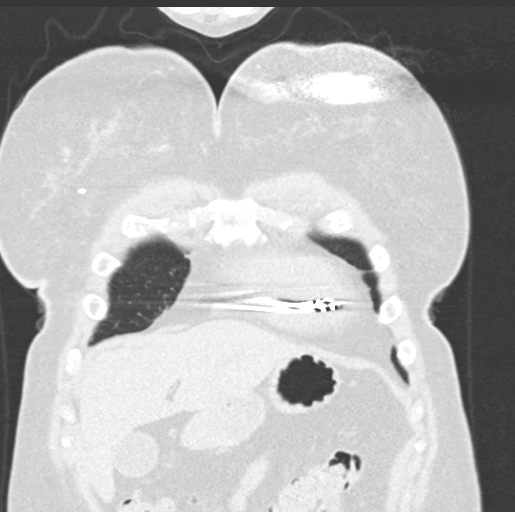
[im 55/136  lung]
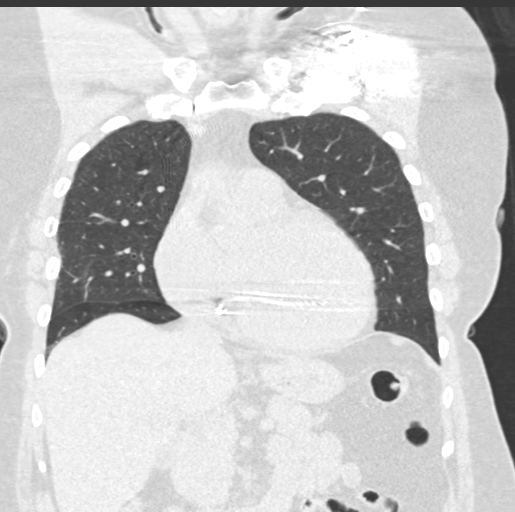
[im 82/136  lung]
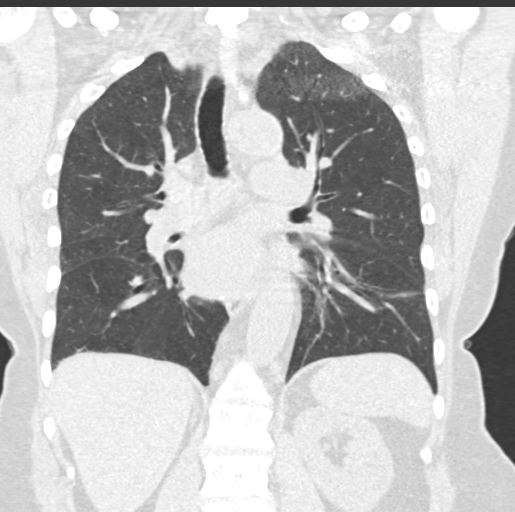

[14 of 36 positions shown; findings below may reference images not displayed]

FINDINGS: Cardiovascular: Left chest single lead pacer defibrillator. Normal
heart size. No pericardial effusion.

Mediastinum/Nodes: No enlarged mediastinal, hilar, or axillary lymph
nodes. Thyroid gland, trachea, and esophagus demonstrate no
significant findings.

Lungs/Pleura: Bandlike scarring of the bilateral lung bases. Lobular
air trapping on expiratory phase imaging. Minimal subpleural
radiation fibrosis of the anterior right lung. Definitively benign 3
mm fissural nodule of the lateral segment right middle lobe abutting
the minor fissure (series 6, image 64). No pleural effusion or
pneumothorax.

Upper Abdomen: No acute abnormality.

Musculoskeletal: No chest wall mass or suspicious bone lesions
identified. Postoperative findings of right lumpectomy.
IMPRESSION: 1. No evidence of fibrotic interstitial lung disease. Bandlike post
infectious or inflammatory scarring of the bilateral lung bases.
2. Minimal subpleural radiation fibrosis of the anterior right lung.
3. Lobular air trapping on expiratory phase imaging, suggestive of
small airways disease.
4. Postoperative findings of right lumpectomy.

## 2022-08-24 ENCOUNTER — Other Ambulatory Visit: Payer: Self-pay | Admitting: Internal Medicine

## 2022-08-24 DIAGNOSIS — C50511 Malignant neoplasm of lower-outer quadrant of right female breast: Secondary | ICD-10-CM

## 2022-08-30 NOTE — Progress Notes (Signed)
Remote ICD transmission.   

## 2022-09-07 ENCOUNTER — Other Ambulatory Visit: Payer: Self-pay | Admitting: Internal Medicine

## 2022-09-23 ENCOUNTER — Other Ambulatory Visit: Payer: Self-pay | Admitting: Internal Medicine

## 2022-10-03 DIAGNOSIS — Z1231 Encounter for screening mammogram for malignant neoplasm of breast: Secondary | ICD-10-CM | POA: Diagnosis not present

## 2022-10-03 LAB — HM MAMMOGRAPHY

## 2022-10-04 ENCOUNTER — Other Ambulatory Visit: Payer: Self-pay | Admitting: Internal Medicine

## 2022-10-06 DIAGNOSIS — H43813 Vitreous degeneration, bilateral: Secondary | ICD-10-CM | POA: Diagnosis not present

## 2022-10-06 DIAGNOSIS — H25813 Combined forms of age-related cataract, bilateral: Secondary | ICD-10-CM | POA: Diagnosis not present

## 2022-10-17 ENCOUNTER — Ambulatory Visit: Payer: Medicare Other | Admitting: Pulmonary Disease

## 2022-11-01 ENCOUNTER — Ambulatory Visit (INDEPENDENT_AMBULATORY_CARE_PROVIDER_SITE_OTHER): Payer: Medicare Other

## 2022-11-01 DIAGNOSIS — I428 Other cardiomyopathies: Secondary | ICD-10-CM | POA: Diagnosis not present

## 2022-11-02 LAB — CUP PACEART REMOTE DEVICE CHECK
Battery Remaining Longevity: 75 mo
Battery Voltage: 2.99 V
Brady Statistic RV Percent Paced: 0.01 %
Date Time Interrogation Session: 20240423031609
HighPow Impedance: 38 Ohm
HighPow Impedance: 49 Ohm
Implantable Lead Connection Status: 753985
Implantable Lead Implant Date: 20081215
Implantable Lead Location: 753860
Implantable Lead Model: 6947
Implantable Pulse Generator Implant Date: 20190418
Lead Channel Impedance Value: 285 Ohm
Lead Channel Impedance Value: 380 Ohm
Lead Channel Pacing Threshold Amplitude: 0.875 V
Lead Channel Pacing Threshold Pulse Width: 0.4 ms
Lead Channel Sensing Intrinsic Amplitude: 14.625 mV
Lead Channel Sensing Intrinsic Amplitude: 14.625 mV
Lead Channel Setting Pacing Amplitude: 2.5 V
Lead Channel Setting Pacing Pulse Width: 0.4 ms
Lead Channel Setting Sensing Sensitivity: 0.3 mV
Zone Setting Status: 755011
Zone Setting Status: 755011

## 2022-11-07 ENCOUNTER — Encounter: Payer: Self-pay | Admitting: Pulmonary Disease

## 2022-11-07 ENCOUNTER — Ambulatory Visit (INDEPENDENT_AMBULATORY_CARE_PROVIDER_SITE_OTHER): Payer: Medicare Other | Admitting: Pulmonary Disease

## 2022-11-07 VITALS — BP 128/74 | HR 68 | Temp 97.8°F | Ht 67.0 in | Wt 219.2 lb

## 2022-11-07 DIAGNOSIS — R0609 Other forms of dyspnea: Secondary | ICD-10-CM | POA: Diagnosis not present

## 2022-11-07 DIAGNOSIS — I428 Other cardiomyopathies: Secondary | ICD-10-CM | POA: Diagnosis not present

## 2022-11-07 MED ORDER — AMOXICILLIN-POT CLAVULANATE 875-125 MG PO TABS: 1.0000 | ORAL_TABLET | Freq: Two times a day (BID) | ORAL | 0 refills | Status: AC

## 2022-11-07 NOTE — Patient Instructions (Signed)
Nice to see you again  Continue Stiolto, no changes to inhalers  Take Augmentin 1 tablet twice a day for 10 days for the ear pain, presumed infection there.  Return to clinic in 6 months or sooner as needed with Dr. Judeth Horn

## 2022-11-07 NOTE — Progress Notes (Signed)
@Patient  ID: [name] [name], female    DOB: 1940-10-05, 82 y.o.   MRN: [id number]  Chief Complaint  Patient presents with   Follow-up    Doing the same    Referring provider: Corwin Levins, MD  HPI:   82 y.o. woman whom we are seeing in follow up for evaluation of dyspnea on exertion.  Most recent PCP note reviewed.  Most recent cardiology note reviewed.  Doing better. DOE better.  Stiolto continues to be beneficial.  Lungs clear today.  She feels her breathing is doing well.  Allergies again not well-controlled.  Using her nasal sprays as prescribed by me.  Overall okay but gets congested and then the ears get stopped up.  Nasal congestion okay but we are currently painful.  On the left.  Right looks clear.  Left with good light reflex with compared to the right fluid more cloudy.  Suspect this is more allergic given given response to antibiotics in the past, okay to prescribe again today.  Discussed referral to ENT but she declines today.  HPI at initial visit: Overall, she is doing relatively well.  She endorses chronic dyspnea on exertion.  Worse on inclines or stairs.  No time of day when things are better or worse.  No seasonal or environmental factors make things better or worse.  No position to make things better or worse.  Has albuterol as needed and this does seem to help a bit.  Was on ICS/LABA inhaler but did not like the way it tasted.  Did not like rinsing out her mouth.  Maybe helped a little bit.  Reviewed most recent chest imaging CT chest high-resolution 04/2020 that on my review interpretation shows basilar right greater than left mild bronchiectasis with small nodule.  CPET 09/2020 reviewed that shows limitation of her body habitus, hypoventilation, chronotropic incompetence.  Spirometry prior to that CPET demonstrated normal spirometry.  Most recent echocardiogram 09/2018 reviewed demonstrating EF of 20%, dilated LA.  PMH: Cardiomyopathy, history of breast cancer in  remission Surgical history: Hysterectomy, lumpectomy for breast cancer Family history: Mother with CAD Social history: Never smoker, lives in [place] / Pulmonary Flowsheets:   ACT:      No data to display          MMRC:     No data to display          Epworth:      No data to display          Tests:   FENO:  No results found for: "NITRICOXIDE"  PFT:     No data to display          WALK:      No data to display          Imaging: Personally reviewed and as per EMR discussion this note CUP PACEART REMOTE DEVICE CHECK  Result Date: 11/02/2022 Scheduled remote reviewed. Normal device function.  2 AT/AF, 10-24min in duraition, EGM's appear SR with ectopy Next remote 91 days. LA, CVRS   Lab Results: Personally reviewed CBC    Component Value Date/Time   WBC 6.6 12/01/2021 1508   RBC 4.60 12/01/2021 1508   HGB 11.8 (L) 12/01/2021 1508   HGB 10.1 (L) 02/28/2019 0941   HGB 10.8 (L) 10/11/2017 1252   HCT 37.4 12/01/2021 1508   HCT 33.9 (L) 10/11/2017 1252   PLT 252.0 12/01/2021 1508   PLT 290 02/28/2019 0941   PLT 226 10/11/2017 1252  MCV 81.2 12/01/2021 1508   MCV 79 10/11/2017 1252   MCH 26.3 05/03/2019 0826   MCHC 31.5 12/01/2021 1508   RDW 16.7 (H) 12/01/2021 1508   RDW 16.7 (H) 10/11/2017 1252   LYMPHSABS 3.2 12/01/2021 1508   MONOABS 0.5 12/01/2021 1508   EOSABS 0.3 12/01/2021 1508   BASOSABS 0.0 12/01/2021 1508    BMET    Component Value Date/Time   NA 137 12/01/2021 1508   NA 141 09/27/2021 0912   K 4.2 12/01/2021 1508   CL 101 12/01/2021 1508   CO2 27 12/01/2021 1508   GLUCOSE 102 (H) 12/01/2021 1508   BUN 23 12/01/2021 1508   BUN 22 09/27/2021 0912   CREATININE 1.56 (H) 12/01/2021 1508   CREATININE 1.34 (H) 02/28/2019 0941   CREATININE 1.54 (H) 12/11/2015 1001   CALCIUM 9.7 12/01/2021 1508   GFRNONAA 34 (L) 04/03/2020 1635   GFRNONAA 38 (L) 02/28/2019 0941   GFRAA 39 (L) 04/03/2020 1635   GFRAA  44 (L) 02/28/2019 0941    BNP    Component Value Date/Time   BNP 366.8 (H) 05/19/2017 1235    ProBNP    Component Value Date/Time   PROBNP 55.2 08/20/2010 0935    Specialty Problems       Pulmonary Problems   Allergic rhinitis    Qualifier: Diagnosis of  By: Jonny Ruiz MD, Len Blalock       Acute sinusitis   Wheezing   Pulmonary nodule   Acute cough    Allergies  Allergen Reactions   Topamax [Topiramate] Other (See Comments)    Confusion, gets lost going home   Zocor [Simvastatin - High Dose] Nausea Only    Nausea     Immunization History  Administered Date(s) Administered   Fluad Quad(high Dose 65+) 05/31/2019, 03/23/2022   Influenza-Unspecified 03/18/2021   PFIZER(Purple Top)SARS-COV-2 Vaccination 08/28/2019, 09/18/2019, 04/24/2020, 04/24/2020, 04/03/2021   Pneumococcal Conjugate-13 02/07/2013   Pneumococcal Polysaccharide-23 05/01/2014   Tdap 05/22/2018    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   amoxicillin-clavulanate (AUGMENTIN) 875-125 MG tablet Take 1 tablet by mouth 2 (two) times daily for 10 days.   Azelastine HCl 137 MCG/SPRAY SOLN PLACE 2 SPRAYS INTO BOTH NOSTRILS 2 (TWO) TIMES DAILY. USE IN EACH NOSTRIL AS DIRECTED   benzonatate (TESSALON PERLES) 100 MG capsule 1-2 tab by mouth evry 8 hr as needed   carvedilol (COREG) 25 MG tablet TAKE ONE-HALF TABLET BY MOUTH  TWICE DAILY   cholecalciferol (VITAMIN D3) 25 MCG (1000 UT)  tablet Take 1,000 Units by mouth daily.   dapagliflozin propanediol (FARXIGA) 10 MG TABS tablet TAKE 1 TABLET BY MOUTH DAILY BEFORE BREAKFAST.   furosemide (LASIX) 40 MG tablet TAKE 1 TABLET BY MOUTH TWICE  DAILY   lidocaine-prilocaine (EMLA) cream APPLY TO AFFECTED AREA ONCE AS DIRECTED   meloxicam (MOBIC) 15 MG tablet TAKE 1 TABLET BY MOUTH EVERY DAY AS NEEDED FOR PAIN   montelukast (SINGULAIR) 10 MG tablet TAKE 1 TABLET BY MOUTH EVERYDAY AT BEDTIME   Multiple Vitamin (MULTIVITAMIN WITH MINERALS) TABS tablet Take 1 tablet by mouth daily.   nitroGLYCERIN (NITROLINGUAL) 0.4 MG/SPRAY spray Place 1 spray under the tongue every 5 (five) minutes x 3 doses as needed for chest pain.   rizatriptan (MAXALT) 10 MG tablet Take 10 mg by mouth as needed for migraine. May repeat in 2 hours if needed   rosuvastatin (CRESTOR) 20 MG tablet TAKE 1 TABLET BY MOUTH DAILY   sacubitril-valsartan (ENTRESTO) 24-26 MG TAKE 1 TABLET BY MOUTH TWICE A DAY   scopolamine (TRANSDERM-SCOP, 1.5 MG,) 1 MG/3DAYS Place 1 patch (1.5 mg total) onto the skin every 3 (three) days.   spironolactone (ALDACTONE) 25 MG tablet TAKE ONE-HALF TABLET BY MOUTH  DAILY   Tiotropium Bromide-Olodaterol (STIOLTO RESPIMAT) 2.5-2.5 MCG/ACT AERS Inhale 2 puffs into the lungs daily.   tiZANidine (ZANAFLEX) 2 MG tablet TAKE 2 TABLETS BY MOUTH EVERY 6 HOURS AS NEEDED FOR MUSCLE SPASM   traMADol (ULTRAM) 50 MG tablet Take 1 tablet (50 mg total) by mouth every 6 (six) hours as needed.    triamcinolone (NASACORT) 55 MCG/ACT AERO nasal inhaler Place 2 sprays into the nose daily.   triamcinolone cream (KENALOG) 0.1 % APPLY TO AFFECTED AREA TWICE A DAY   No facility-administered encounter medications on file as of 11/07/2022.     Review of Systems  Review of Systems  N/a Physical Exam  BP 128/74 (BP Location: Left Arm, Patient Position: Sitting, Cuff Size: Normal)   Pulse 68   Temp 97.8 F (36.6 C) (Oral)   Ht 5\' 7"  (1.702 m)   Wt 219 lb 3.2 oz (99.4 kg)   SpO2 97%   BMI 34.33 kg/m   Wt Readings from Last 5 Encounters:  11/07/22 219 lb 3.2 oz (99.4 kg)  06/07/22 210 lb (95.3 kg)  06/07/22 210 lb (95.3 kg)  05/17/22 208 lb 9.6 oz (94.6 kg)  04/19/22 208 lb 3.2 oz (94.4 kg)    BMI Readings from Last 5 Encounters:  11/07/22 34.33 kg/m  06/07/22 32.89 kg/m  06/07/22 32.89 kg/m  05/17/22 32.67 kg/m  04/19/22 32.61 kg/m     Physical Exam General: Well-appearing, no acute distress Eyes: EOMI, no icterus Ears: Right tympanic membrane clear with clear normal-appearing fluid, left tympanic membrane bulging with cloudy fluid behind Neck: Supple, no JVP Pulmonary: Clear, normal work of breathing Cardiovascular: Regular rhythm, no murmur Abdomen: Nondistended, bowel sounds present MSK: No synovitis, no joint effusion Neuro: Normal gait, no weakness Psych: Normal mood, full affect   Assessment & Plan:   Dyspnea on exertion: Likely multifactorial and related to deconditioning, chronotropic/inotropic insufficiency in the setting of severe cardiomyopathy EF 20%, possibly uncontrolled asthma.  PFT with CPET 09/2020 with normal spirometry.  Albuterol helps some.  Marked improvement with Stiolto.  To continue.  Likely asthma: Prescribed ICS/LABA versus Advair in the past and did not like taste, did not like rinsing mouth.  Albuterol helps some.   Avoid ICS therapy given her aversion to rinsing out mouth after  every use. DOE much better with [employer].   Lung  nodule: Tiny, less than 6 mm seen in 2021.  She does have a history of breast cancer so consider her high risk.  Repeat CT 08/2021 without concern. No further imaging needed.  Eustachian tube dysfunction: Primary on the left.  Suspect allergic, seasonal, but given cloudy appearance of fluid Augmentin twice daily for 10 days for ear infection prescribed.  Continue intranasal regimen azelastine and flonase.  Seen by ENT for similar issue, they were unhelpful not interested.  Consider referral to ENT again in the future but she declines at this time given experience in the past.   Return in about 6 months (around 05/09/2023).   Karren Burly, MD 11/07/2022

## 2022-11-11 DIAGNOSIS — H43813 Vitreous degeneration, bilateral: Secondary | ICD-10-CM | POA: Diagnosis not present

## 2022-11-11 DIAGNOSIS — H25811 Combined forms of age-related cataract, right eye: Secondary | ICD-10-CM | POA: Diagnosis not present

## 2022-11-23 ENCOUNTER — Other Ambulatory Visit: Payer: Self-pay | Admitting: Hematology and Oncology

## 2022-11-30 NOTE — Progress Notes (Signed)
Remote ICD transmission.   

## 2022-12-01 DIAGNOSIS — H2511 Age-related nuclear cataract, right eye: Secondary | ICD-10-CM | POA: Diagnosis not present

## 2022-12-06 ENCOUNTER — Ambulatory Visit: Payer: Medicare Other | Admitting: Internal Medicine

## 2022-12-08 ENCOUNTER — Other Ambulatory Visit: Payer: Self-pay | Admitting: Internal Medicine

## 2022-12-18 ENCOUNTER — Other Ambulatory Visit: Payer: Self-pay | Admitting: Pulmonary Disease

## 2022-12-18 DIAGNOSIS — J4540 Moderate persistent asthma, uncomplicated: Secondary | ICD-10-CM

## 2022-12-19 DIAGNOSIS — H2512 Age-related nuclear cataract, left eye: Secondary | ICD-10-CM | POA: Diagnosis not present

## 2022-12-22 DIAGNOSIS — H2512 Age-related nuclear cataract, left eye: Secondary | ICD-10-CM | POA: Diagnosis not present

## 2022-12-29 ENCOUNTER — Ambulatory Visit: Payer: Medicare Other | Admitting: Internal Medicine

## 2023-01-03 ENCOUNTER — Ambulatory Visit: Payer: Medicare Other | Admitting: Internal Medicine

## 2023-01-04 ENCOUNTER — Other Ambulatory Visit: Payer: Self-pay | Admitting: Pulmonary Disease

## 2023-01-26 NOTE — Progress Notes (Unsigned)
HPI: FU nonischemic cardiomyopathy. Cardiac catheterization August 2004 showed normal coronary arteries. 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 Patient was referred to Dr. Ladona Ridgel by Dr. Graciela Husbands for consideration of ablation but Dr. Ladona Ridgel felt medical therapy indicated. Nuclear study repeated April 2018 and showed ejection fraction 35% with no ischemia or infarction. Last echocardiogram March 2020 showed ejection fraction 20 to 25%, severe left ventricular enlargement, grade 2 diastolic dysfunction, mild left atrial enlargement.  CPX March 2022 showed low normal functional capacity with primary limitation associated with body habitus and patient hyperventilating at peak exercise.  Monitor 12/22 showed sinus rhythm with nonsustained ventricular tachycardia longest 7 beats, brief PAT longest lasting 12.7 seconds and frequent PVCs (7.3%). Since I last saw her, the patient has dyspnea with more extreme activities but not with routine activities. It is relieved with rest. It is not associated with chest pain. There is no orthopnea, PND or pedal edema. There is no syncope or palpitations. There is no exertional chest pain.   Current Outpatient Medications  Medication Sig Dispense Refill   albuterol (PROVENTIL HFA;VENTOLIN HFA) 108 (90 Base) MCG/ACT inhaler Inhale 1 puff into the lungs every 6 (six) hours as needed for wheezing or shortness of breath.     Azelastine HCl 137 MCG/SPRAY SOLN PLACE 2 SPRAYS INTO BOTH NOSTRILS 2 (TWO) TIMES DAILY. USE IN EACH NOSTRIL AS DIRECTED 30 mL 2   benzonatate (TESSALON PERLES) 100 MG capsule 1-2 tab by mouth evry 8 hr as needed 40 capsule 2   carvedilol (COREG) 25 MG tablet TAKE ONE-HALF TABLET BY MOUTH  TWICE DAILY 100 tablet 2   cholecalciferol (VITAMIN D3) 25 MCG (1000 UT) tablet Take 1,000 Units by mouth daily.     dapagliflozin propanediol (FARXIGA) 10 MG TABS tablet TAKE 1 TABLET BY  MOUTH DAILY BEFORE BREAKFAST. 90 tablet 3   furosemide (LASIX) 40 MG tablet TAKE 1 TABLET BY MOUTH TWICE  DAILY 200 tablet 2   lidocaine-prilocaine (EMLA) cream APPLY TO AFFECTED AREA ONCE AS DIRECTED 30 g 3   meloxicam (MOBIC) 15 MG tablet TAKE 1 TABLET BY MOUTH EVERY DAY AS NEEDED FOR PAIN 90 tablet 3   Multiple Vitamin (MULTIVITAMIN WITH MINERALS) TABS tablet Take 1 tablet by mouth daily.     nitroGLYCERIN (NITROLINGUAL) 0.4 MG/SPRAY spray Place 1 spray under the tongue every 5 (five) minutes x 3 doses as needed for chest pain. 12 g 3   rizatriptan (MAXALT) 10 MG tablet Take 10 mg by mouth as needed for migraine. May repeat in 2 hours if needed     sacubitril-valsartan (ENTRESTO) 24-26 MG TAKE 1 TABLET BY MOUTH TWICE A DAY 60 tablet 10   scopolamine (TRANSDERM-SCOP, 1.5 MG,) 1 MG/3DAYS Place 1 patch (1.5 mg total) onto the skin every 3 (three) days. 10 patch 0   spironolactone (ALDACTONE) 25 MG tablet TAKE ONE-HALF TABLET BY MOUTH  DAILY 50 tablet 2   Tiotropium Bromide-Olodaterol (STIOLTO RESPIMAT) 2.5-2.5 MCG/ACT AERS INHALE 2 PUFFS BY MOUTH INTO THE LUNGS DAILY 4 g 11   tiZANidine (ZANAFLEX) 2 MG tablet TAKE 2 TABLETS BY MOUTH EVERY 6 HOURS AS NEEDED FOR MUSCLE SPASM 60 tablet 2   traMADol (ULTRAM) 50 MG tablet Take 1 tablet (50 mg total) by mouth every 6 (six) hours as needed. 120 tablet 2   triamcinolone cream (KENALOG) 0.1 % APPLY TO AFFECTED AREA TWICE A DAY 30 g 0   montelukast (  SINGULAIR) 10 MG tablet TAKE 1 TABLET BY MOUTH EVERYDAY AT BEDTIME (Patient not taking: Reported on 02/02/2023) 90 tablet 2   rosuvastatin (CRESTOR) 20 MG tablet TAKE 1 TABLET BY MOUTH DAILY (Patient not taking: Reported on 02/02/2023) 90 tablet 0   triamcinolone (NASACORT) 55 MCG/ACT AERO nasal inhaler Place 2 sprays into the nose daily. (Patient not taking: Reported on 02/02/2023) 1 each 12   No current facility-administered medications for this visit.     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   OVERACTIVE BLADDER 08/03/2010   PYELONEPHRITIS, HX OF 05/07/2007   SYSTOLIC HEART FAILURE, CHRONIC 12/28/2008   VITAMIN D DEFICIENCY 06/17/2008    Past Surgical History:  Procedure Laterality Date   ABDOMINAL HYSTERECTOMY     BREAST LUMPECTOMY WITH RADIOACTIVE SEED AND SENTINEL LYMPH NODE BIOPSY Right 10/11/2018   Procedure: RIGHT BREAST LUMPECTOMY WITH RADIOACTIVE SEED AND SENTINEL LYMPH NODE BIOPSY;  Surgeon: Almond Lint, MD;  Location: [facility] OR;  Service: General;  Laterality: Right;   ICD GENERATOR CHANGEOUT N/A 10/25/2017   Procedure: ICD GENERATOR CHANGEOUT;  Surgeon: Duke Salvia, MD;  Location: [facility] INVASIVE CV LAB;  Service: Cardiovascular;  Laterality: N/A;   LAMINECTOMY     PORT-A-CATH REMOVAL N/A 05/08/2019   Procedure: REMOVAL OF PORT-A-CATH RIGHT CHEST;  Surgeon: Almond Lint, MD;  Location: [facility] OR;  Service: General;  Laterality: N/A;   PORTACATH PLACEMENT N/A 10/11/2018   Procedure: INSERTION PORT-A-CATH;  Surgeon: Almond Lint, MD;  Location: [facility] OR;  Service: General;  Laterality: N/A;   RE-EXCISION OF BREAST LUMPECTOMY Right 10/23/2018   Procedure: RE-EXCISION OF RIGHT BREAST LUMPECTOMY;  Surgeon: Almond Lint, MD;  Location: [facility] OR;  Service: General;  Laterality: Right;    Social History   Socioeconomic History   Marital status: Widowed    Spouse name: Not on file   Number of children: 3   Years of education: Not on file   Highest education level: Not on file  Occupational History   Occupation: retired  Conservation officer, nature: RETIRED  Tobacco Use   Smoking status: Never   Smokeless tobacco: Never  Substance and Sexual Activity   Alcohol use: No   Drug use: No   Sexual activity: Never  Other Topics Concern   Not on file  Social History Narrative   ** Merged History Encounter **       Social Determinants of Health   Financial Resource Strain: Low Risk  (01/25/2022)   Overall Financial Resource Strain (CARDIA)    Difficulty of Paying Living Expenses: Not hard at all  Food Insecurity: No Food Insecurity (01/25/2022)   Hunger Vital Sign    Worried About Running Out of Food in the Last Year: Never true    Ran Out of Food in the Last Year: Never true  Transportation Needs: No Transportation Needs (01/25/2022)   PRAPARE - Administrator, Civil Service (Medical): No    Lack of Transportation (Non-Medical): No  Physical Activity: Sufficiently Active (01/25/2022)   Exercise Vital Sign  Days of Exercise per Week: 7 days    Minutes of Exercise per Session: 30 min  Stress: No Stress Concern Present (01/25/2022)   [employer] of Occupational Health - Occupational Stress Questionnaire    Feeling of Stress : Not at all  Social Connections: Moderately Integrated (01/25/2022)   Social Connection and Isolation Panel [NHANES]    Frequency of Communication with Friends and Family: More than three times a week    Frequency of Social Gatherings with Friends and Family: Once a week    Attends Religious Services: More than 4 times per year    Active Member of [employer] or Organizations: No    Attends Engineer, structural: More than 4 times per year    Marital Status: Widowed  Intimate Partner Violence: Not At Risk (01/25/2022)   Humiliation, Afraid, Rape, and Kick questionnaire    Fear of Current or Ex-Partner: No    Emotionally Abused: No    Physically Abused: No    Sexually Abused: No    Family History  Problem Relation Age of Onset   Arthritis Other    Coronary  artery disease Other    Kidney disease Other    Heart disease Mother     ROS: no fevers or chills, productive cough, hemoptysis, dysphasia, odynophagia, melena, hematochezia, dysuria, hematuria, rash, seizure activity, orthopnea, PND, pedal edema, claudication. Remaining systems are negative.  Physical Exam: Well-developed well-nourished in no acute distress.  Skin is warm and dry.  HEENT is normal.  Neck is supple.  Chest is clear to auscultation with normal expansion.  Cardiovascular exam is regular rate and rhythm.  Abdominal exam nontender or distended. No masses palpated. Extremities show no edema. neuro grossly intact  EKG Interpretation Date/Time:  Thursday February 02 2023 12:59:25 EDT Ventricular Rate:  69 PR Interval:  168 QRS Duration:  136 QT Interval:  400 QTC Calculation: 428 R Axis:   -4  Text Interpretation: Sinus rhythm with sinus arrhythmia with occasional Premature ventricular complexes Left ventricular hypertrophy with QRS widening and repolarization abnormality ( R in aVL , Cornell product ) When compared with ECG of 25-Jun-2007 15:49, Premature ventricular complexes are now Present Questionable change in QRS duration Confirmed by Olga Millers ([id number]) on 02/02/2023 1:04:10 PM    A/P  1 chronic systolic congestive heart failure-she is euvolemic on examination.  Will continue spironolactone, Farxiga and Lasix at present dose.  Check potassium and renal function.  2 nonischemic cardiomyopathy-continue Entresto and beta-blocker. Repeat echo.  3 hypertension-blood pressure controlled.  Continue present medications.  4 hyperlipidemia-continue statin.  Check lipids and liver.  5 ICD-managed by electrophysiology.  6 history of PVCs-continue beta-blockade.  Previously seen by Dr. Ladona Ridgel and ablation not pursued.  Olga Millers, MD

## 2023-01-30 ENCOUNTER — Ambulatory Visit: Payer: Medicare Other | Admitting: Internal Medicine

## 2023-01-31 ENCOUNTER — Ambulatory Visit: Payer: Medicare Other | Attending: Internal Medicine

## 2023-01-31 DIAGNOSIS — I428 Other cardiomyopathies: Secondary | ICD-10-CM

## 2023-02-02 ENCOUNTER — Ambulatory Visit: Payer: Medicare Other | Attending: Cardiology | Admitting: Cardiology

## 2023-02-02 ENCOUNTER — Encounter: Payer: Self-pay | Admitting: Cardiology

## 2023-02-02 VITALS — BP 104/60 | HR 69 | Ht 67.0 in | Wt 216.0 lb

## 2023-02-02 DIAGNOSIS — I428 Other cardiomyopathies: Secondary | ICD-10-CM

## 2023-02-02 DIAGNOSIS — E7800 Pure hypercholesterolemia, unspecified: Secondary | ICD-10-CM | POA: Diagnosis not present

## 2023-02-02 DIAGNOSIS — Z95810 Presence of automatic (implantable) cardiac defibrillator: Secondary | ICD-10-CM

## 2023-02-02 DIAGNOSIS — I5022 Chronic systolic (congestive) heart failure: Secondary | ICD-10-CM | POA: Diagnosis not present

## 2023-02-02 DIAGNOSIS — I493 Ventricular premature depolarization: Secondary | ICD-10-CM | POA: Diagnosis not present

## 2023-02-02 LAB — CUP PACEART REMOTE DEVICE CHECK
Battery Remaining Longevity: 72 mo
Battery Voltage: 2.99 V
Brady Statistic RV Percent Paced: 0.01 %
Date Time Interrogation Session: 20240723001805
HighPow Impedance: 41 Ohm
HighPow Impedance: 53 Ohm
Implantable Lead Connection Status: 753985
Implantable Lead Implant Date: 20081215
Implantable Lead Location: 753860
Implantable Lead Model: 6947
Implantable Pulse Generator Implant Date: 20190418
Lead Channel Impedance Value: 380 Ohm
Lead Channel Impedance Value: 456 Ohm
Lead Channel Pacing Threshold Amplitude: 0.875 V
Lead Channel Sensing Intrinsic Amplitude: 20 mV
Lead Channel Setting Pacing Amplitude: 2.5 V
Lead Channel Setting Pacing Pulse Width: 0.4 ms
Lead Channel Setting Sensing Sensitivity: 0.3 mV
Zone Setting Status: 755011
Zone Setting Status: 755011

## 2023-02-02 MED ORDER — ENTRESTO 24-26 MG PO TABS
1.0000 | ORAL_TABLET | Freq: Two times a day (BID) | ORAL | Status: DC
Start: 2023-02-02 — End: 2023-07-03

## 2023-02-02 NOTE — Addendum Note (Signed)
Addended by: Freddi Starr on: 02/02/2023 01:29 PM   Modules accepted: Orders

## 2023-02-02 NOTE — Patient Instructions (Signed)
  Testing/Procedures:  Your physician has requested that you have an echocardiogram. Echocardiography is a painless test that uses sound waves to create images of your heart. It provides your doctor with information about the size and shape of your heart and how well your heart's chambers and valves are working. This procedure takes approximately one hour. There are no restrictions for this procedure. Please do NOT wear cologne, perfume, aftershave, or lotions (deodorant is allowed). Please arrive 15 minutes prior to your appointment time. [address]   Follow-Up: At [facility], you and your health needs are our priority.  As part of our continuing mission to provide you with exceptional heart care, we have created designated Provider Care Teams.  These Care Teams include your primary Cardiologist (physician) and Advanced Practice Providers (APPs -  Physician Assistants and Nurse Practitioners) who all work together to provide you with the care you need, when you need it.  We recommend signing up for the patient portal called "MyChart".  Sign up information is provided on this After Visit Summary.  MyChart is used to connect with patients for Virtual Visits (Telemedicine).  Patients are able to view lab/test results, encounter notes, upcoming appointments, etc.  Non-urgent messages can be sent to your provider as well.   To learn more about what you can do with MyChart, go to [email or web address].    Your next appointment:   12 month(s)  Provider:   BRIAN CRENSHAW MD    

## 2023-02-05 ENCOUNTER — Other Ambulatory Visit: Payer: Self-pay | Admitting: Internal Medicine

## 2023-02-05 DIAGNOSIS — C50511 Malignant neoplasm of lower-outer quadrant of right female breast: Secondary | ICD-10-CM

## 2023-02-06 ENCOUNTER — Other Ambulatory Visit: Payer: Self-pay

## 2023-02-07 ENCOUNTER — Encounter: Payer: Self-pay | Admitting: Internal Medicine

## 2023-02-07 ENCOUNTER — Ambulatory Visit (INDEPENDENT_AMBULATORY_CARE_PROVIDER_SITE_OTHER): Payer: Medicare Other | Admitting: Internal Medicine

## 2023-02-07 VITALS — BP 120/70 | HR 79 | Temp 99.0°F | Ht 67.0 in | Wt 216.0 lb

## 2023-02-07 DIAGNOSIS — L02214 Cutaneous abscess of groin: Secondary | ICD-10-CM

## 2023-02-07 DIAGNOSIS — R739 Hyperglycemia, unspecified: Secondary | ICD-10-CM | POA: Diagnosis not present

## 2023-02-07 DIAGNOSIS — N1831 Chronic kidney disease, stage 3a: Secondary | ICD-10-CM | POA: Diagnosis not present

## 2023-02-07 DIAGNOSIS — E559 Vitamin D deficiency, unspecified: Secondary | ICD-10-CM

## 2023-02-07 DIAGNOSIS — E538 Deficiency of other specified B group vitamins: Secondary | ICD-10-CM | POA: Diagnosis not present

## 2023-02-07 DIAGNOSIS — Z0001 Encounter for general adult medical examination with abnormal findings: Secondary | ICD-10-CM

## 2023-02-07 DIAGNOSIS — E7800 Pure hypercholesterolemia, unspecified: Secondary | ICD-10-CM

## 2023-02-07 DIAGNOSIS — I5022 Chronic systolic (congestive) heart failure: Secondary | ICD-10-CM

## 2023-02-07 LAB — CBC WITH DIFFERENTIAL/PLATELET
Basophils Absolute: 0 10*3/uL (ref 0.0–0.1)
Basophils Relative: 0.2 % (ref 0.0–3.0)
Eosinophils Absolute: 0.1 10*3/uL (ref 0.0–0.7)
Eosinophils Relative: 0.9 % (ref 0.0–5.0)
HCT: 37.6 % (ref 36.0–46.0)
Hemoglobin: 11.7 g/dL — ABNORMAL LOW (ref 12.0–15.0)
Lymphocytes Relative: 32.4 % (ref 12.0–46.0)
Lymphs Abs: 3.1 10*3/uL (ref 0.7–4.0)
MCHC: 31.2 g/dL (ref 30.0–36.0)
MCV: 81.3 fl (ref 78.0–100.0)
Monocytes Absolute: 0.8 10*3/uL (ref 0.1–1.0)
Monocytes Relative: 8.1 % (ref 3.0–12.0)
Neutro Abs: 5.6 10*3/uL (ref 1.4–7.7)
Neutrophils Relative %: 58.4 % (ref 43.0–77.0)
Platelets: 172 10*3/uL (ref 150.0–400.0)
RBC: 4.62 Mil/uL (ref 3.87–5.11)
RDW: 16 % — ABNORMAL HIGH (ref 11.5–15.5)
WBC: 9.6 10*3/uL (ref 4.0–10.5)

## 2023-02-07 LAB — MICROALBUMIN / CREATININE URINE RATIO
Creatinine,U: 177.7 mg/dL
Microalb Creat Ratio: 0.6 mg/g (ref 0.0–30.0)
Microalb, Ur: 1.1 mg/dL (ref 0.0–1.9)

## 2023-02-07 LAB — HEMOGLOBIN A1C: Hgb A1c MFr Bld: 6.2 % (ref 4.6–6.5)

## 2023-02-07 LAB — TSH: TSH: 0.81 u[IU]/mL (ref 0.35–5.50)

## 2023-02-07 LAB — BASIC METABOLIC PANEL
BUN: 20 mg/dL (ref 6–23)
CO2: 27 mEq/L (ref 19–32)
Calcium: 9.6 mg/dL (ref 8.4–10.5)
Chloride: 101 mEq/L (ref 96–112)
Creatinine, Ser: 1.63 mg/dL — ABNORMAL HIGH (ref 0.40–1.20)
GFR: 29.67 mL/min — ABNORMAL LOW (ref >60.00)
Glucose, Bld: 102 mg/dL — ABNORMAL HIGH (ref 70–99)
Potassium: 4.9 mEq/L (ref 3.5–5.1)
Sodium: 136 mEq/L (ref 135–145)

## 2023-02-07 LAB — HEPATIC FUNCTION PANEL
ALT: 9 U/L (ref 0–35)
AST: 12 U/L (ref 0–37)
Albumin: 4.3 g/dL (ref 3.5–5.2)
Alkaline Phosphatase: 77 U/L (ref 39–117)
Bilirubin, Direct: 0.1 mg/dL (ref 0.0–0.3)
Total Bilirubin: 0.7 mg/dL (ref 0.2–1.2)
Total Protein: 7.5 g/dL (ref 6.0–8.3)

## 2023-02-07 LAB — VITAMIN B12: Vitamin B-12: 185 pg/mL — ABNORMAL LOW (ref 211–911)

## 2023-02-07 LAB — LIPID PANEL
Cholesterol: 112 mg/dL (ref 0–200)
HDL: 48.2 mg/dL (ref >39.00)
LDL Cholesterol: 47 mg/dL (ref 0–99)
NonHDL: 63.34
Total CHOL/HDL Ratio: 2
Triglycerides: 80 mg/dL (ref 0.0–149.0)
VLDL: 16 mg/dL (ref 0.0–40.0)

## 2023-02-07 LAB — VITAMIN D 25 HYDROXY (VIT D DEFICIENCY, FRACTURES): VITD: 42.22 ng/mL (ref 30.00–100.00)

## 2023-02-07 MED ORDER — ROSUVASTATIN CALCIUM 20 MG PO TABS: 20.0000 mg | ORAL_TABLET | Freq: Every day | ORAL | 3 refills | Status: AC

## 2023-02-07 MED ORDER — MELOXICAM 15 MG PO TABS: 15.0000 mg | ORAL_TABLET | Freq: Every day | ORAL | 3 refills | Status: DC | PRN

## 2023-02-07 MED ORDER — DOXYCYCLINE HYCLATE 100 MG PO TABS: 100.0000 mg | ORAL_TABLET | Freq: Two times a day (BID) | ORAL | 0 refills | Status: DC

## 2023-02-07 MED ORDER — TRAMADOL HCL 50 MG PO TABS: 50.0000 mg | ORAL_TABLET | Freq: Four times a day (QID) | ORAL | 2 refills | Status: DC | PRN

## 2023-02-07 MED ORDER — SPIRONOLACTONE 25 MG PO TABS
12.5000 mg | ORAL_TABLET | Freq: Every day | ORAL | 3 refills | Status: DC
Start: 2023-02-07 — End: 2024-04-08

## 2023-02-07 NOTE — Patient Instructions (Addendum)
Please have your Shingrix (shingles) shots done at your local pharmacy.  Please take all new medication as prescribed - the antibiotic  Please continue all other medications as before, and refills have been done if requested.  Please have the pharmacy call with any other refills you may need.  Please continue your efforts at being more active, low cholesterol diet, and weight control.  You are otherwise up to date with prevention measures today.  Please keep your appointments with your specialists as you may have planned  Please go to the LAB at the blood drawing area for the tests to be done  You will be contacted by phone if any changes need to be made immediately.  Otherwise, you will receive a letter about your results with an explanation, but please check with MyChart first.  Please remember to sign up for MyChart if you have not done so, as this will be important to you in the future with finding out test results, communicating by private email, and scheduling acute appointments online when needed.  Please make an Appointment to return in 6 months, or sooner if needed

## 2023-02-07 NOTE — Progress Notes (Signed)
Patient ID: [name] [name], female   DOB: 1941/03/02, 82 y.o.   MRN: [id number]         Chief Complaint:: wellness exam and left groin abscess, chf, low vit d, hld, low b12, hyperglycemia       HPI:  [name] [name] is a 82 y.o. female here for wellness exam; declines covid booster, dxa for now, for shingrix at pharmacy, o/w up to date                Also c/o 2 days onset left groin red, pain, swelling without drainage or fever, chills.  Has hx of recurring boils but none for several months.  Pt denies chest pain, increased sob or doe, wheezing, orthopnea, PND, increased LE swelling, palpitations, dizziness or syncope.   Pt denies polydipsia, polyuria, or new focal neuro s/s.    Pt denies fever, wt loss, night sweats, loss of appetite, or other constitutional symptoms    Wt Readings from Last 3 Encounters:  02/07/23 216 lb (98 kg)  02/02/23 216 lb (98 kg)  11/07/22 219 lb 3.2 oz (99.4 kg)   BP Readings from Last 3 Encounters:  02/07/23 120/70  02/02/23 104/60  11/07/22 128/74   Immunization History  Administered Date(s) Administered   Fluad Quad(high Dose 65+) 05/31/2019, 03/23/2022   Influenza-Unspecified 03/18/2021   PFIZER(Purple Top)SARS-COV-2 Vaccination 08/28/2019, 09/18/2019, 04/24/2020, 04/24/2020, 04/03/2021   Pneumococcal Conjugate-13 02/07/2013   Pneumococcal Polysaccharide-23 05/01/2014   Tdap 05/22/2018   Health Maintenance Due  Topic Date Due   Zoster Vaccines- Shingrix (1 of 2) Never done   DEXA SCAN  Never done   COVID-19 Vaccine (6 - 2023-24 season) 03/11/2022   Medicare Annual Wellness (AWV)  01/26/2023      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   GERD 05/07/2007   History of kidney stones     HYPERLIPIDEMIA 05/23/2007   HYPERTENSION 05/07/2007   KELOID SCAR, 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   MENOPAUSAL DISORDER 05/06/2008   OSTEOARTHRITIS, KNEES, 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   OVERACTIVE BLADDER 08/03/2010   PYELONEPHRITIS, HX OF 05/07/2007   SYSTOLIC HEART FAILURE, CHRONIC 12/28/2008   VITAMIN D DEFICIENCY 06/17/2008   Past Surgical History:  Procedure Laterality Date   ABDOMINAL HYSTERECTOMY     BREAST LUMPECTOMY WITH RADIOACTIVE SEED AND SENTINEL LYMPH NODE BIOPSY Right 10/11/2018   Procedure: RIGHT BREAST LUMPECTOMY WITH RADIOACTIVE SEED AND SENTINEL LYMPH NODE BIOPSY;  Surgeon: Almond Lint, MD;  Location: [facility] OR;  Service: General;  Laterality: Right;   ICD GENERATOR CHANGEOUT N/A 10/25/2017   Procedure: ICD GENERATOR CHANGEOUT;  Surgeon: Duke Salvia, MD;  Location: [facility] INVASIVE CV LAB;  Service: Cardiovascular;  Laterality: N/A;   LAMINECTOMY     PORT-A-CATH REMOVAL N/A 05/08/2019   Procedure: REMOVAL OF PORT-A-CATH RIGHT CHEST;  Surgeon: Almond Lint, MD;  Location: [facility] OR;  Service: General;  Laterality: N/A;   PORTACATH PLACEMENT N/A 10/11/2018   Procedure: INSERTION PORT-A-CATH;  Surgeon: Almond Lint, MD;  Location: [facility] OR;  Service: General;  Laterality: N/A;   RE-EXCISION OF BREAST LUMPECTOMY Right 10/23/2018   Procedure: RE-EXCISION OF RIGHT BREAST LUMPECTOMY;  Surgeon: Almond Lint, MD;  Location: [facility] OR;  Service: General;  Laterality: Right;    reports that she has never smoked. She has never used  smokeless tobacco. She reports that she does not drink alcohol and does not use drugs. family history includes Arthritis in an other family member; Coronary artery disease in an other family member; Heart disease in her mother; Kidney disease in an other family member. No Active Allergies Current Outpatient Medications on File Prior to Visit  Medication Sig Dispense Refill   albuterol (PROVENTIL HFA;VENTOLIN HFA) 108 (90 Base) MCG/ACT inhaler Inhale 1 puff into the lungs every 6 (six) hours as  needed for wheezing or shortness of breath.     benzonatate (TESSALON PERLES) 100 MG capsule 1-2 tab by mouth evry 8 hr as needed 40 capsule 2   carvedilol (COREG) 25 MG tablet TAKE ONE-HALF TABLET BY MOUTH  TWICE DAILY 100 tablet 2   cholecalciferol (VITAMIN D3) 25 MCG (1000 UT) tablet Take 1,000 Units by mouth daily.     dapagliflozin propanediol (FARXIGA) 10 MG TABS tablet TAKE 1 TABLET BY MOUTH DAILY BEFORE BREAKFAST. 90 tablet 3   furosemide (LASIX) 40 MG tablet TAKE 1 TABLET BY MOUTH TWICE  DAILY 200 tablet 2   lidocaine-prilocaine (EMLA) cream APPLY TO AFFECTED AREA ONCE AS DIRECTED 30 g 3   Multiple Vitamin (MULTIVITAMIN WITH MINERALS) TABS tablet Take 1 tablet by mouth daily.     nitroGLYCERIN (NITROLINGUAL) 0.4 MG/SPRAY spray Place 1 spray under the tongue every 5 (five) minutes x 3 doses as needed for chest pain. 12 g 3   rizatriptan (MAXALT) 10 MG tablet Take 10 mg by mouth as needed for migraine. May repeat in 2 hours if needed     sacubitril-valsartan (ENTRESTO) 24-26 MG Take 1 tablet by mouth 2 (two) times daily. 28 tablet    scopolamine (TRANSDERM-SCOP, 1.5 MG,) 1 MG/3DAYS Place 1 patch (1.5 mg total) onto the skin every 3 (three) days. 10 patch 0   Tiotropium Bromide-Olodaterol (STIOLTO RESPIMAT) 2.5-2.5 MCG/ACT AERS INHALE 2 PUFFS BY MOUTH INTO THE LUNGS DAILY 4 g 11   tiZANidine (ZANAFLEX) 2 MG tablet TAKE 2 TABLETS BY MOUTH EVERY 6 HOURS AS NEEDED FOR MUSCLE SPASM 60 tablet 2   No current facility-administered medications on file prior to visit.        ROS:  All others reviewed and negative.  Objective        PE:  BP 120/70 (BP Location: Left Arm, Patient Position: Sitting, Cuff Size: Normal)   Pulse 79   Temp 99 F (37.2 C) (Oral)   Ht 5\' 7"  (1.702 m)   Wt 216 lb (98 kg)   SpO2 98%   BMI 33.83 kg/m                 Constitutional: Pt appears in NAD               HENT: Head: NCAT.                Right Ear: External ear normal.                 Left Ear: External  ear normal.                Eyes: . Pupils are equal, round, and reactive to light. Conjunctivae and EOM are normal               Nose: without d/c or deformity               Neck: Neck supple. Gross normal ROM  Cardiovascular: Normal rate and regular rhythm.                 Pulmonary/Chest: Effort normal and breath sounds without rales or wheezing.                Abd:  Soft, NT, ND, + BS, no organomegaly               Neurological: Pt is alert. At baseline orientation, motor grossly intact               Skin: Skin is warm.  LE edema - none, left groin with 1 cm area red, tender, swelling               Psychiatric: Pt behavior is normal without agitation   Micro: none  Cardiac tracings I have personally interpreted today:  none  Pertinent Radiological findings (summarize): none   Lab Results  Component Value Date   WBC 9.6 02/07/2023   HGB 11.7 (L) 02/07/2023   HCT 37.6 02/07/2023   PLT 172.0 02/07/2023   GLUCOSE 102 (H) 02/07/2023   CHOL 112 02/07/2023   TRIG 80.0 02/07/2023   HDL 48.20 02/07/2023   LDLDIRECT 129.7 02/07/2013   LDLCALC 47 02/07/2023   ALT 9 02/07/2023   AST 12 02/07/2023   NA 136 02/07/2023   K 4.9 02/07/2023   CL 101 02/07/2023   CREATININE 1.63 (H) 02/07/2023   BUN 20 02/07/2023   CO2 27 02/07/2023   TSH 0.81 02/07/2023   INR 0.9 RATIO 06/18/2007   HGBA1C 6.2 02/07/2023   MICROALBUR 1.1 02/07/2023   Assessment/Plan:  [name] [name] is a 82 y.o. Black or African American [2] female with  has a past medical history of AICD (automatic cardioverter/defibrillator) present, ALLERGIC RHINITIS (11/06/2007), ANEMIA-IRON DEFICIENCY (05/23/2007), ANEMIA-NOS (06/17/2008), ANXIETY (02/22/2008), CARDIOMYOPATHY, PRIMARY, DILATED (12/30/2008), DEGENERATIVE DISC DISEASE, LUMBAR SPINE (05/07/2007), GERD (05/07/2007), History of kidney stones, HYPERLIPIDEMIA (05/23/2007), HYPERTENSION (05/07/2007), KELOID SCAR, HX OF (02/02/2009), MENOPAUSAL DISORDER (05/06/2008),  OSTEOARTHRITIS, KNEES, BILATERAL (05/23/2007), OVERACTIVE BLADDER (08/03/2010), PYELONEPHRITIS, HX OF (05/07/2007), SYSTOLIC HEART FAILURE, CHRONIC (12/28/2008), and VITAMIN D DEFICIENCY (06/17/2008).  SYSTOLIC HEART FAILURE, CHRONIC Stable volume, cont current med tx  Encounter for well adult exam with abnormal findings Age and sex appropriate education and counseling updated with regular exercise and diet Referrals for preventative services - dxa declined for now Immunizations addressed - declines covid booster, for shingrix at pharmacy Smoking counseling  - none needed Evidence for depression or other mood disorder - none significant Most recent labs reviewed. I have personally reviewed and have noted: 1) the patient's medical and social history 2) The patient's current medications and supplements 3) The patient's height, weight, and BMI have been recorded in the chart   Vitamin D deficiency Last vitamin D Lab Results  Component Value Date   VD25OH 42.22 02/07/2023   Stable, cont oral replacement   Hyperlipidemia Lab Results  Component Value Date   LDLCALC 47 02/07/2023   Stable, pt to continue current statin crestor 20 qd   CKD (chronic kidney disease) stage 3, GFR 30-59 ml/min Lab Results  Component Value Date   CREATININE 1.63 (H) 02/07/2023   Stable overall, cont to avoid nephrotoxins   B12 deficiency Lab Results  Component Value Date   VITAMINB12 185 (L) 02/07/2023   Low, to start oral replacement - b12 1000 mcg qd   Hyperglycemia Lab Results  Component Value Date   HGBA1C 6.2 02/07/2023   Stable, pt to continue current  medical treatment  - diet, wt control   Abscess of groin, left Mild to mod, for antibx course doxycycline 100 bid, declines gyn or surgury referral,  to f/u any worsening symptoms or concerns  Followup: Return in about 6 months (around 08/10/2023).  Oliver Barre, MD 02/10/2023 8:51 PM [facility]  [facility]

## 2023-02-10 ENCOUNTER — Encounter: Payer: Self-pay | Admitting: Internal Medicine

## 2023-02-10 DIAGNOSIS — L02214 Cutaneous abscess of groin: Secondary | ICD-10-CM | POA: Insufficient documentation

## 2023-02-10 NOTE — Assessment & Plan Note (Signed)
Lab Results  Component Value Date   VITAMINB12 185 (L) 02/07/2023   Low, to start oral replacement - b12 1000 mcg qd

## 2023-02-10 NOTE — Assessment & Plan Note (Signed)
Stable volume, cont current med tx 

## 2023-02-10 NOTE — Assessment & Plan Note (Signed)
Mild to mod, for antibx course doxycycline 100 bid, declines gyn or surgury referral,  to f/u any worsening symptoms or concerns

## 2023-02-10 NOTE — Assessment & Plan Note (Signed)
Lab Results  Component Value Date   CREATININE 1.63 (H) 02/07/2023   Stable overall, cont to avoid nephrotoxins

## 2023-02-10 NOTE — Assessment & Plan Note (Signed)
Lab Results  Component Value Date   LDLCALC 47 02/07/2023   Stable, pt to continue current statin crestor 20 qd

## 2023-02-10 NOTE — Assessment & Plan Note (Signed)
Lab Results  Component Value Date   HGBA1C 6.2 02/07/2023   Stable, pt to continue current medical treatment  - diet, wt control

## 2023-02-10 NOTE — Assessment & Plan Note (Signed)
Last vitamin D Lab Results  Component Value Date   VD25OH 42.22 02/07/2023   Stable, cont oral replacement

## 2023-02-10 NOTE — Assessment & Plan Note (Signed)
Age and sex appropriate education and counseling updated with regular exercise and diet Referrals for preventative services - dxa declined for now Immunizations addressed - declines covid booster, for shingrix at pharmacy Smoking counseling  - none needed Evidence for depression or other mood disorder - none significant Most recent labs reviewed. I have personally reviewed and have noted: 1) the patient's medical and social history 2) The patient's current medications and supplements 3) The patient's height, weight, and BMI have been recorded in the chart

## 2023-02-17 ENCOUNTER — Other Ambulatory Visit: Payer: Self-pay | Admitting: Internal Medicine

## 2023-02-17 DIAGNOSIS — I5022 Chronic systolic (congestive) heart failure: Secondary | ICD-10-CM

## 2023-02-17 NOTE — Progress Notes (Signed)
Remote ICD transmission.   

## 2023-02-27 ENCOUNTER — Ambulatory Visit (HOSPITAL_COMMUNITY): Payer: Medicare Other | Attending: Cardiology

## 2023-02-27 DIAGNOSIS — I428 Other cardiomyopathies: Secondary | ICD-10-CM | POA: Insufficient documentation

## 2023-02-27 LAB — ECHOCARDIOGRAM COMPLETE
Area-P 1/2: 3.83 cm2
MV M vel: 4.2 m/s
MV Peak grad: 70.6 mmHg
Radius: 0.65 cm
S' Lateral: 6.1 cm

## 2023-03-01 ENCOUNTER — Encounter: Payer: Self-pay | Admitting: *Deleted

## 2023-03-23 ENCOUNTER — Other Ambulatory Visit: Payer: Self-pay | Admitting: Cardiology

## 2023-03-28 DIAGNOSIS — E875 Hyperkalemia: Secondary | ICD-10-CM | POA: Diagnosis not present

## 2023-03-28 DIAGNOSIS — I429 Cardiomyopathy, unspecified: Secondary | ICD-10-CM | POA: Diagnosis not present

## 2023-03-28 DIAGNOSIS — D631 Anemia in chronic kidney disease: Secondary | ICD-10-CM | POA: Diagnosis not present

## 2023-03-28 DIAGNOSIS — C50511 Malignant neoplasm of lower-outer quadrant of right female breast: Secondary | ICD-10-CM | POA: Diagnosis not present

## 2023-03-28 DIAGNOSIS — N1831 Chronic kidney disease, stage 3a: Secondary | ICD-10-CM | POA: Diagnosis not present

## 2023-03-29 LAB — LAB REPORT - SCANNED

## 2023-03-30 ENCOUNTER — Ambulatory Visit (INDEPENDENT_AMBULATORY_CARE_PROVIDER_SITE_OTHER): Payer: Medicare Other

## 2023-03-30 VITALS — Ht 67.0 in | Wt 216.0 lb

## 2023-03-30 DIAGNOSIS — Z0000 Encounter for general adult medical examination without abnormal findings: Secondary | ICD-10-CM | POA: Diagnosis not present

## 2023-03-30 NOTE — Patient Instructions (Addendum)
[name] [name] , Thank you for taking time to come for your Medicare Wellness Visit. I appreciate your ongoing commitment to your health goals. Please review the following plan we discussed and let me know if I can assist you in the future.   Referrals/Orders/Follow-Ups/Clinician Recommendations:   This is a list of the screening recommended for you and due dates:  Health Maintenance  Topic Date Due   Zoster (Shingles) Vaccine (1 of 2) Never done   DEXA scan (bone density measurement)  Never done   COVID-19 Vaccine (6 - 2023-24 season) 03/12/2023   Medicare Annual Wellness Visit  03/29/2024   DTaP/Tdap/Td vaccine (2 - Td or Tdap) 05/22/2028   Pneumonia Vaccine  Completed   HPV Vaccine  Aged Out   Flu Shot  Discontinued   Hepatitis C Screening  Discontinued   Opioid Pain Medicine Management Opioids are powerful medicines that are used to treat moderate to severe pain. When used for short periods of time, they can help you to: Sleep better. Do better in physical or occupational therapy. Feel better in the first few days after an injury. Recover from surgery. Opioids should be taken with the supervision of a trained health care provider. They should be taken for the shortest period of time possible. This is because opioids can be addictive, and the longer you take opioids, the greater your risk of addiction. This addiction can also be called opioid use disorder. What are the risks? Using opioid pain medicines for longer than 3 days increases your risk of side effects. Side effects include: Constipation. Nausea and vomiting. Breathing difficulties (respiratory depression). Drowsiness. Confusion. Opioid use disorder. Itching. Taking opioid pain medicine for a long period of time can affect your ability to do daily tasks. It also puts you at risk for: Motor vehicle crashes. Depression. Suicide. Heart attack. Overdose, which can be life-threatening. What is a pain treatment plan? A  pain treatment plan is an agreement between you and your health care provider. Pain is unique to each person, and treatments vary depending on your condition. To manage your pain, you and your health care provider need to work together. To help you do this: Discuss the goals of your treatment, including how much pain you might expect to have and how you will manage the pain. Review the risks and benefits of taking opioid medicines. Remember that a good treatment plan uses more than one approach and minimizes the chance of side effects. Be honest about the amount of medicines you take and about any drug or alcohol use. Get pain medicine prescriptions from only one health care provider. Pain can be managed with many types of alternative treatments. Ask your health care provider to refer you to one or more specialists who can help you manage pain through: Physical or occupational therapy. Counseling (cognitive behavioral therapy). Good nutrition. Biofeedback. Massage. Meditation. Non-opioid medicine. Following a gentle exercise program. How to use opioid pain medicine Taking medicine Take your pain medicine exactly as told by your health care provider. Take it only when you need it. If your pain gets less severe, you may take less than your prescribed dose if your health care provider approves. If you are not having pain, do nottake pain medicine unless your health care provider tells you to take it. If your pain is severe, do nottry to treat it yourself by taking more pills than instructed on your prescription. Contact your health care provider for help. Write down the times when you take your  pain medicine. It is easy to become confused while on pain medicine. Writing the time can help you avoid overdose. Take other over-the-counter or prescription medicines only as told by your health care provider. Keeping yourself and others safe  While you are taking opioid pain medicine: Do not drive,  use machinery, or power tools. Do not sign legal documents. Do not drink alcohol. Do not take sleeping pills. Do not supervise children by yourself. Do not do activities that require climbing or being in high places. Do not go to a lake, river, ocean, spa, or swimming pool. Do not share your pain medicine with anyone. Keep pain medicine in a locked cabinet or in a secure area where pets and children cannot reach it. Stopping your use of opioids If you have been taking opioid medicine for more than a few weeks, you may need to slowly decrease (taper) how much you take until you stop completely. Tapering your use of opioids can decrease your risk of symptoms of withdrawal, such as: Pain and cramping in the abdomen. Nausea. Sweating. Sleepiness. Restlessness. Uncontrollable shaking (tremors). Cravings for the medicine. Do not attempt to taper your use of opioids on your own. Talk with your health care provider about how to do this. Your health care provider may prescribe a step-down schedule based on how much medicine you are taking and how long you have been taking it. Getting rid of leftover pills Do not save any leftover pills. Get rid of leftover pills safely by: Taking the medicine to a prescription take-back program. This is usually offered by the county or law enforcement. Bringing them to a pharmacy that has a drug disposal container. Flushing them down the toilet. Check the label or package insert of your medicine to see whether this is safe to do. Throwing them out in the trash. Check the label or package insert of your medicine to see whether this is safe to do. If it is safe to throw it out, remove the medicine from the original container, put it into a sealable bag or container, and mix it with used coffee grounds, food scraps, dirt, or cat litter before putting it in the trash. Follow these instructions at home: Activity Do exercises as told by your health care provider. Avoid  activities that make your pain worse. Return to your normal activities as told by your health care provider. Ask your health care provider what activities are safe for you. General instructions You may need to take these actions to prevent or treat constipation: Drink enough fluid to keep your urine pale yellow. Take over-the-counter or prescription medicines. Eat foods that are high in fiber, such as beans, whole grains, and fresh fruits and vegetables. Limit foods that are high in fat and processed sugars, such as fried or sweet foods. Keep all follow-up visits. This is important. Where to find support If you have been taking opioids for a long time, you may benefit from receiving support for quitting from a local support group or counselor. Ask your health care provider for a referral to these resources in your area. Where to find more information Centers for Disease Control and Prevention (CDC): FootballExhibition.com.br [place] Food and Drug Administration (FDA): PumpkinSearch.com.ee Get help right away if: You may have taken too much of an opioid (overdosed). Common symptoms of an overdose: Your breathing is slower or more shallow than normal. You have a very slow heartbeat (pulse). You have slurred speech. You have nausea and vomiting. Your pupils become very  small. You have other potential symptoms: You are very confused. You faint or feel like you will faint. You have cold, clammy skin. You have blue lips or fingernails. You have thoughts of harming yourself or harming others. These symptoms may represent a serious problem that is an emergency. Do not wait to see if the symptoms will go away. Get medical help right away. Call your local emergency services (911 in the [place] Do not drive yourself to the hospital.  If you ever feel like you may hurt yourself or others, or have thoughts about taking your own life, get help right away. Go to your nearest emergency department or: Call your local emergency  services (911 in the [place] Call the [facility] ((367)539-5128 in the [place] Call a suicide crisis helpline, such as the National Suicide Prevention Lifeline at 669 869 6750 or 988 in the [place] This is open 24 hours a day in the [place] Text the Crisis Text Line at 580-792-5611 (in the [place] Summary Opioid medicines can help you manage moderate to severe pain for a short period of time. A pain treatment plan is an agreement between you and your health care provider. Discuss the goals of your treatment, including how much pain you might expect to have and how you will manage the pain. If you think that you or someone else may have taken too much of an opioid, get medical help right away. This information is not intended to replace advice given to you by your health care provider. Make sure you discuss any questions you have with your health care provider. Document Revised: 01/20/2021 Document Reviewed: 10/07/2020 Elsevier Patient Education  2024 [employer]  Advanced directives: (Copy Requested) Please bring a copy of your health care power of attorney and living will to the office to be added to your chart at your convenience.  Next Medicare Annual Wellness Visit scheduled for next year:

## 2023-03-30 NOTE — Progress Notes (Signed)
Subjective:   [name] [name] is a 82 y.o. female who presents for Medicare Annual (Subsequent) preventive examination.  Visit Complete: Virtual  I connected with  [name] [name] on 03/30/23 by a audio enabled telemedicine application and verified that I am speaking with the correct person using two identifiers.  Patient Location: Home  Provider Location: Home Office  I discussed the limitations of evaluation and management by telemedicine. The patient expressed understanding and agreed to proceed.   Vital Signs: Unable to obtain new vitals due to this being a telehealth visit.   Cardiac Risk Factors include: advanced age (>60men, >14 women);hypertension     Objective:    Today's Vitals   03/30/23 1435  Weight: 216 lb (98 kg)  Height: 5\' 7"  (1.702 m)   Body mass index is 33.83 kg/m.     03/30/2023    2:44 PM 05/17/2022    3:00 PM 01/25/2022   10:56 AM 11/25/2020   10:16 AM 07/22/2019   12:13 PM 05/03/2019    8:16 AM 03/15/2019    8:32 AM  Advanced Directives  Does Patient Have a Medical Advance Directive? Yes No Yes Yes Yes No No  Type of Estate agent of [place];Living will  Living will;Healthcare Power of Attorney Living will     Does patient want to make changes to medical advance directive?   No - Patient declined No - Patient declined     Copy of Healthcare Power of Attorney in Chart? No - copy requested  No - copy requested      Would patient like information on creating a medical advance directive?      No - Patient declined     Current Medications (verified) Outpatient Encounter Medications as of 03/30/2023  Medication Sig   albuterol (PROVENTIL HFA;VENTOLIN HFA) 108 (90 Base) MCG/ACT inhaler Inhale 1 puff into the lungs every 6 (six) hours as needed for wheezing or shortness of breath.   benzonatate (TESSALON PERLES) 100 MG capsule 1-2 tab by mouth evry 8 hr as needed   carvedilol (COREG) 25 MG tablet TAKE ONE-HALF TABLET BY MOUTH  TWICE  DAILY   cholecalciferol (VITAMIN D3) 25 MCG (1000 UT) tablet Take 1,000 Units by mouth daily.   dapagliflozin propanediol (FARXIGA) 10 MG TABS tablet TAKE 1 TABLET BY MOUTH DAILY BEFORE BREAKFAST.   doxycycline (VIBRA-TABS) 100 MG tablet Take 1 tablet (100 mg total) by mouth 2 (two) times daily.   furosemide (LASIX) 40 MG tablet TAKE 1 TABLET BY MOUTH TWICE  DAILY   lidocaine-prilocaine (EMLA) cream APPLY TO AFFECTED AREA ONCE AS DIRECTED   meloxicam (MOBIC) 15 MG tablet Take 1 tablet (15 mg total) by mouth daily as needed for pain.   Multiple Vitamin (MULTIVITAMIN WITH MINERALS) TABS tablet Take 1 tablet by mouth daily.   nitroGLYCERIN (NITROLINGUAL) 0.4 MG/SPRAY spray Place 1 spray under the tongue every 5 (five) minutes x 3 doses as needed for chest pain.   rizatriptan (MAXALT) 10 MG tablet Take 10 mg by mouth as needed for migraine. May repeat in 2 hours if needed   rosuvastatin (CRESTOR) 20 MG tablet Take 1 tablet (20 mg total) by mouth daily.   sacubitril-valsartan (ENTRESTO) 24-26 MG Take 1 tablet by mouth 2 (two) times daily.   scopolamine (TRANSDERM-SCOP, 1.5 MG,) 1 MG/3DAYS Place 1 patch (1.5 mg total) onto the skin every 3 (three) days.   spironolactone (ALDACTONE) 25 MG tablet Take 0.5 tablets (12.5 mg total) by mouth daily.  Tiotropium Bromide-Olodaterol (STIOLTO RESPIMAT) 2.5-2.5 MCG/ACT AERS INHALE 2 PUFFS BY MOUTH INTO THE LUNGS DAILY   tiZANidine (ZANAFLEX) 2 MG tablet TAKE 2 TABLETS BY MOUTH EVERY 6 HOURS AS NEEDED FOR MUSCLE SPASM   traMADol (ULTRAM) 50 MG tablet Take 1 tablet (50 mg total) by mouth every 6 (six) hours as needed.   No facility-administered encounter medications on file as of 03/30/2023.    Allergies (verified) Patient has no active allergies.   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   OVERACTIVE BLADDER 08/03/2010   PYELONEPHRITIS, HX OF 05/07/2007   SYSTOLIC HEART FAILURE, CHRONIC 12/28/2008   VITAMIN D DEFICIENCY 06/17/2008   Past Surgical History:  Procedure Laterality Date   ABDOMINAL HYSTERECTOMY     BREAST LUMPECTOMY WITH RADIOACTIVE SEED AND SENTINEL LYMPH NODE BIOPSY Right 10/11/2018   Procedure: RIGHT BREAST LUMPECTOMY WITH RADIOACTIVE SEED AND SENTINEL LYMPH NODE BIOPSY;  Surgeon: Almond Lint, MD;  Location: [facility] OR;  Service: General;  Laterality: Right;   ICD GENERATOR CHANGEOUT N/A 10/25/2017   Procedure: ICD GENERATOR CHANGEOUT;  Surgeon: Duke Salvia, MD;  Location: [facility] INVASIVE CV LAB;  Service: Cardiovascular;  Laterality: N/A;   LAMINECTOMY     PORT-A-CATH REMOVAL N/A 05/08/2019   Procedure: REMOVAL OF PORT-A-CATH RIGHT CHEST;  Surgeon: Almond Lint, MD;  Location: [facility] OR;  Service: General;  Laterality: N/A;   PORTACATH PLACEMENT N/A 10/11/2018   Procedure: INSERTION PORT-A-CATH;  Surgeon: Almond Lint, MD;  Location: [facility] OR;  Service: General;  Laterality: N/A;   RE-EXCISION OF BREAST LUMPECTOMY Right 10/23/2018   Procedure: RE-EXCISION OF RIGHT BREAST LUMPECTOMY;  Surgeon: Almond Lint, MD;  Location: [facility] OR;  Service: General;  Laterality: Right;   Family History  Problem Relation Age of Onset   Arthritis Other    Coronary artery disease Other    Kidney disease Other    Heart disease Mother    Social History   Socioeconomic History   Marital status: Widowed    Spouse name: Not on file   Number of children: 3   Years of education: Not on file   Highest education level: Associate degree: occupational, Scientist, product/process development, or vocational  program  Occupational History   Occupation: retired Conservation officer, nature: RETIRED  Tobacco Use   Smoking status: Never   Smokeless tobacco: Never  Substance and Sexual Activity   Alcohol use: No   Drug use: No   Sexual activity: Never  Other Topics Concern   Not on file  Social History Narrative   ** Merged History Encounter **       Social Determinants of Health   Financial Resource Strain: Low Risk  (03/30/2023)   Overall Financial Resource Strain (CARDIA)    Difficulty of Paying Living Expenses: Not hard at all  Food Insecurity: No Food Insecurity (03/30/2023)   Hunger Vital Sign    Worried About Running Out of Food in the Last Year: Never true    Ran Out of Food in the Last Year: Never true  Transportation Needs: No Transportation  Needs (03/30/2023)   PRAPARE - Administrator, Civil Service (Medical): No    Lack of Transportation (Non-Medical): No  Physical Activity: Insufficiently Active (03/30/2023)   Exercise Vital Sign    Days of Exercise per Week: 5 days    Minutes of Exercise per Session: 20 min  Stress: No Stress Concern Present (03/30/2023)   [employer] of Occupational Health - Occupational Stress Questionnaire    Feeling of Stress : Not at all  Social Connections: Socially Integrated (03/30/2023)   Social Connection and Isolation Panel [NHANES]    Frequency of Communication with Friends and Family: More than three times a week    Frequency of Social Gatherings with Friends and Family: More than three times a week    Attends Religious Services: More than 4 times per year    Active Member of [employer] or Organizations: Yes    Attends Engineer, structural: More than 4 times per year    Marital Status: Married    Tobacco Counseling Counseling given: Not Answered   Clinical Intake:  Pre-visit preparation completed: Yes  Pain : No/denies pain     BMI - recorded: 33.83 Nutritional Status: BMI > 30  Obese Nutritional Risks:  None Diabetes: No  How often do you need to have someone help you when you read instructions, pamphlets, or other written materials from your doctor or pharmacy?: 1 - Never  Interpreter Needed?: No  Information entered by :: Theresa Mulligan LPN   Activities of Daily Living    03/30/2023    2:42 PM  In your present state of health, do you have any difficulty performing the following activities:  Hearing? 0  Vision? 0  Difficulty concentrating or making decisions? 0  Walking or climbing stairs? 0  Dressing or bathing? 0  Doing errands, shopping? 0  Preparing Food and eating ? N  Using the Toilet? N  In the past six months, have you accidently leaked urine? N  Do you have problems with loss of bowel control? N  Managing your Medications? N  Managing your Finances? N  Housekeeping or managing your Housekeeping? N    Patient Care Team: Corwin Levins, MD as PCP - General Jens Som, Madolyn Frieze, MD as Consulting Physician (Cardiology) Duke Salvia, MD as Consulting Physician (Cardiology) Serena Croissant, MD as Consulting Physician (Hematology and Oncology) Almond Lint, MD as Consulting Physician (General Surgery) Lonie Peak, MD as Attending Physician (Radiation Oncology)  Indicate any recent Medical Services you may have received from other than [facility] providers in the past year (date may be approximate).     Assessment:   This is a routine wellness examination for [facility].  Hearing/Vision screen Hearing Screening - Comments:: Denies hearing difficulties   Vision Screening - Comments:: Wears rx glasses - up to date with routine eye exams with  Dr Dione Booze   Goals Addressed               This Visit's Progress     Lose 20 lbs (pt-stated)         Depression Screen    03/30/2023    2:41 PM 02/07/2023   10:07 AM 03/23/2022    8:39 AM 01/25/2022   10:55 AM 12/01/2021    2:34 PM 12/01/2021    2:21 PM 11/25/2020   10:15 AM  PHQ 2/9 Scores  PHQ - 2 Score 0 0 0 0 0 0 0  PHQ- 9  Score   2   1  Fall Risk    03/30/2023    2:43 PM 02/07/2023   10:07 AM 03/23/2022    8:40 AM 01/25/2022   10:57 AM 12/01/2021    2:34 PM  Fall Risk   Falls in the past year? 0 0 0 0 0  Number falls in past yr: 0 0  0 0  Injury with Fall? 0 0 0 0 0  Risk for fall due to : No Fall Risks No Fall Risks No Fall Risks No Fall Risks   Follow up Falls prevention discussed Falls evaluation completed Falls evaluation completed Falls evaluation completed     MEDICARE RISK AT HOME: Medicare Risk at Home Any stairs in or around the home?: Yes If so, are there any without handrails?: No Home free of loose throw rugs in walkways, pet beds, electrical cords, etc?: Yes Adequate lighting in your home to reduce risk of falls?: Yes Life alert?: No Use of a cane, walker or w/c?: No Grab bars in the bathroom?: No Shower chair or bench in shower?: Yes Elevated toilet seat or a handicapped toilet?: No  TIMED UP AND GO:  Was the test performed?  No    Cognitive Function:    05/08/2015    9:47 AM  MMSE - Mini Mental State Exam  Not completed: --        03/30/2023    2:44 PM 01/25/2022   11:07 AM  6CIT Screen  What Year? 0 points 0 points  What month? 0 points 0 points  What time? 0 points 0 points  Count back from 20 0 points 0 points  Months in reverse 0 points 0 points  Repeat phrase 0 points 0 points  Total Score 0 points 0 points    Immunizations Immunization History  Administered Date(s) Administered   Fluad Quad(high Dose 65+) 05/31/2019, 03/23/2022   Influenza-Unspecified 03/18/2021   PFIZER(Purple Top)SARS-COV-2 Vaccination 08/28/2019, 09/18/2019, 04/24/2020, 04/24/2020, 04/03/2021   Pneumococcal Conjugate-13 02/07/2013   Pneumococcal Polysaccharide-23 05/01/2014   Tdap 05/22/2018    TDAP status: Up to date    Pneumococcal vaccine status: Up to date  Covid-19 vaccine status: Declined, Education has been provided regarding the importance of this vaccine but patient  still declined. Advised may receive this vaccine at local pharmacy or Health Dept.or vaccine clinic. Aware to provide a copy of the vaccination record if obtained from local pharmacy or Health Dept. Verbalized acceptance and understanding.  Qualifies for Shingles Vaccine? Yes   Zostavax completed No   Shingrix Completed?: No.    Education has been provided regarding the importance of this vaccine. Patient has been advised to call insurance company to determine out of pocket expense if they have not yet received this vaccine. Advised may also receive vaccine at local pharmacy or Health Dept. Verbalized acceptance and understanding.  Screening Tests Health Maintenance  Topic Date Due   Zoster Vaccines- Shingrix (1 of 2) Never done   DEXA SCAN  Never done   COVID-19 Vaccine (6 - 2023-24 season) 03/12/2023   Medicare Annual Wellness (AWV)  03/29/2024   DTaP/Tdap/Td (2 - Td or Tdap) 05/22/2028   Pneumonia Vaccine 32+ Years old  Completed   HPV VACCINES  Aged Out   INFLUENZA VACCINE  Discontinued   Hepatitis C Screening  Discontinued    Health Maintenance  Health Maintenance Due  Topic Date Due   Zoster Vaccines- Shingrix (1 of 2) Never done   DEXA SCAN  Never done   COVID-19 Vaccine (6 - 2023-24 season)  03/12/2023    Bone Density status: Ordered Deferred. Pt provided with contact info and advised to call to schedule appt.    Additional Screening:    Vision Screening: Recommended annual ophthalmology exams for early detection of glaucoma and other disorders of the eye. Is the patient up to date with their annual eye exam?  Yes  Who is the provider or what is the name of the office in which the patient attends annual eye exams? Dr Dione Booze If pt is not established with a provider, would they like to be referred to a provider to establish care? No .   Dental Screening: Recommended annual dental exams for proper oral hygiene    Community Resource Referral / Chronic Care  Management:  CRR required this visit?  No   CCM required this visit?  No     Plan:     I have personally reviewed and noted the following in the patient's chart:   Medical and social history Use of alcohol, tobacco or illicit drugs  Current medications and supplements including opioid prescriptions. Patient is currently taking opioid prescriptions. Information provided to patient regarding non-opioid alternatives. Patient advised to discuss non-opioid treatment plan with their provider. Functional ability and status Nutritional status Physical activity Advanced directives List of other physicians Hospitalizations, surgeries, and ER visits in previous 12 months Vitals Screenings to include cognitive, depression, and falls Referrals and appointments  In addition, I have reviewed and discussed with patient certain preventive protocols, quality metrics, and best practice recommendations. A written personalized care plan for preventive services as well as general preventive health recommendations were provided to patient.     Tillie Rung, LPN   [id number]   After Visit Summary: (MyChart) Due to this being a telephonic visit, the after visit summary with patients personalized plan was offered to patient via MyChart   Nurse Notes: None

## 2023-04-14 DIAGNOSIS — C50511 Malignant neoplasm of lower-outer quadrant of right female breast: Secondary | ICD-10-CM | POA: Diagnosis not present

## 2023-04-14 DIAGNOSIS — Z171 Estrogen receptor negative status [ER-]: Secondary | ICD-10-CM | POA: Diagnosis not present

## 2023-05-01 ENCOUNTER — Other Ambulatory Visit: Payer: Self-pay | Admitting: Internal Medicine

## 2023-05-02 ENCOUNTER — Ambulatory Visit (INDEPENDENT_AMBULATORY_CARE_PROVIDER_SITE_OTHER): Payer: Medicare Other

## 2023-05-02 DIAGNOSIS — I5022 Chronic systolic (congestive) heart failure: Secondary | ICD-10-CM

## 2023-05-02 DIAGNOSIS — I428 Other cardiomyopathies: Secondary | ICD-10-CM

## 2023-05-04 LAB — CUP PACEART REMOTE DEVICE CHECK
Battery Remaining Longevity: 69 mo
Battery Voltage: 2.99 V
Brady Statistic RV Percent Paced: 0.01 %
Date Time Interrogation Session: 20241022001706
HighPow Impedance: 40 Ohm
HighPow Impedance: 55 Ohm
Implantable Lead Connection Status: 753985
Implantable Lead Implant Date: 20081215
Implantable Lead Location: 753860
Implantable Lead Model: 6947
Implantable Pulse Generator Implant Date: 20190418
Lead Channel Impedance Value: 380 Ohm
Lead Channel Impedance Value: 456 Ohm
Lead Channel Pacing Threshold Amplitude: 0.875 V
Lead Channel Pacing Threshold Pulse Width: 0.4 ms
Lead Channel Sensing Intrinsic Amplitude: 17.125 mV
Lead Channel Sensing Intrinsic Amplitude: 17.125 mV
Lead Channel Setting Pacing Amplitude: 2.5 V
Lead Channel Setting Pacing Pulse Width: 0.4 ms
Lead Channel Setting Sensing Sensitivity: 0.3 mV
Zone Setting Status: 755011
Zone Setting Status: 755011

## 2023-05-18 ENCOUNTER — Inpatient Hospital Stay: Payer: Medicare Other | Attending: Hematology and Oncology | Admitting: Hematology and Oncology

## 2023-05-18 VITALS — BP 119/56 | HR 60 | Temp 97.8°F | Resp 18 | Ht 67.0 in | Wt 222.2 lb

## 2023-05-18 DIAGNOSIS — R5383 Other fatigue: Secondary | ICD-10-CM | POA: Insufficient documentation

## 2023-05-18 DIAGNOSIS — Z9221 Personal history of antineoplastic chemotherapy: Secondary | ICD-10-CM | POA: Diagnosis not present

## 2023-05-18 DIAGNOSIS — Z923 Personal history of irradiation: Secondary | ICD-10-CM | POA: Insufficient documentation

## 2023-05-18 DIAGNOSIS — C50511 Malignant neoplasm of lower-outer quadrant of right female breast: Secondary | ICD-10-CM | POA: Insufficient documentation

## 2023-05-18 DIAGNOSIS — Z171 Estrogen receptor negative status [ER-]: Secondary | ICD-10-CM | POA: Diagnosis not present

## 2023-05-18 DIAGNOSIS — Z79899 Other long term (current) drug therapy: Secondary | ICD-10-CM | POA: Diagnosis not present

## 2023-05-18 NOTE — Assessment & Plan Note (Signed)
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  Adjuvant chemotherapy with CMF x6 cycles 11/15/2018-02/28/2019 2.  Followed by adjuvant radiation 03/26/2019-04/19/2019 ----------------------------------------------------------------------------------------------------------------------------------------------------- Breast cancer surveillance: 1.  Breast exam 05/18/2023 benign, tenderness in the right and left breast without any palpable nodularity. 2.  Mammogram  10/03/22: [facility]: Benign breast density category B   Severe fatigue: Significantly improved with changing her inhalers.   Return to clinic in 1 year for follow-up

## 2023-05-18 NOTE — Progress Notes (Signed)
Patient Care Team: Corwin Levins, MD as PCP - General Jens Som Madolyn Frieze, MD as Consulting Physician (Cardiology) Duke Salvia, MD as Consulting Physician (Cardiology) Serena Croissant, MD as Consulting Physician (Hematology and Oncology) Almond Lint, MD as Consulting Physician (General Surgery) Lonie Peak, MD as Attending Physician (Radiation Oncology)  DIAGNOSIS:  Encounter Diagnosis  Name Primary?   Malignant neoplasm of lower-outer quadrant of right breast of female, estrogen receptor negative (HCC) Yes    SUMMARY OF ONCOLOGIC HISTORY: Oncology History  Malignant neoplasm of lower-outer quadrant of right breast of female, estrogen receptor negative (HCC)  08/29/2018 Initial Diagnosis   Screening mammogram detected 2 adjacent masses right breast 8:30 position LOQ 8 cm from nipple 1.1 cm and 5 mm, combined mass 1.7 cm, axilla negative, biopsy ([id number]): Grade 3 IDC triple negative, Ki-67 80%, T1CN0 stage 1B clinical stage   09/05/2018 Cancer Staging   Staging form: Breast, AJCC 8th Edition - Clinical stage from 09/05/2018: Stage IB (cT1c, cN0, cM0, G3, ER-, PR-, HER2-) - Signed by Serena Croissant, MD on 09/05/2018   10/11/2018 Surgery   Right lumpectomy ([id number]): Grade 3 IDC, 1.6 cm, 0/3 lymph nodes negative, superior margin broadly positive, triple negative, Ki-67 80%, T1CN0 stage Ib. 0/3 lymph nodes positive for carcinoma.   10/16/2018 Cancer Staging   Staging form: Breast, AJCC 8th Edition - Pathologic stage from 10/16/2018: Stage IB (pT1c, pN0, cM0, G3, ER-, PR-, HER2-) - Signed by Serena Croissant, MD on 10/16/2018   10/23/2018 Surgery   Reexcision [phone]): margins negative for carcinoma   11/15/2018 - 02/28/2019 Chemotherapy   The patient had palonosetron (ALOXI) injection 0.25 mg, 0.25 mg, Intravenous,  Once, 6 of 6 cycles Administration: 0.25 mg (11/15/2018), 0.25 mg (12/06/2018), 0.25 mg (12/27/2018), 0.25 mg (01/17/2019), 0.25 mg (02/07/2019), 0.25 mg (02/28/2019) methotrexate  (PF) chemo injection 65 mg, 84.5 mg, Intravenous,  Once, 6 of 6 cycles Dose modification: 30 mg/m2 (original dose 40 mg/m2, Cycle 2, Reason: Change in SCr/CrCl, Comment: CrCl 53), 25 mg/m2 (original dose 40 mg/m2, Cycle 5, Reason: Dose not tolerated) Administration: 65 mg (11/15/2018), 65 mg (12/06/2018), 65 mg (12/27/2018), 63.25 mg (01/17/2019), 53 mg (02/07/2019), 53 mg (02/28/2019) cyclophosphamide (CYTOXAN) 1,260 mg in sodium chloride 0.9 % 250 mL chemo infusion, 600 mg/m2 = 1,260 mg, Intravenous,  Once, 6 of 6 cycles Dose modification: 500 mg/m2 (original dose 600 mg/m2, Cycle 5, Reason: Provider Judgment) Administration: 1,260 mg (11/15/2018), 1,260 mg (12/06/2018), 1,260 mg (12/27/2018), 1,260 mg (01/17/2019), 1,060 mg (02/07/2019), 1,060 mg (02/28/2019) fluorouracil (ADRUCIL) chemo injection 1,250 mg, 600 mg/m2 = 1,250 mg, Intravenous,  Once, 6 of 6 cycles Dose modification: 400 mg/m2 (original dose 600 mg/m2, Cycle 5, Reason: Dose not tolerated) Administration: 1,250 mg (11/15/2018), 1,250 mg (12/06/2018), 1,250 mg (12/27/2018), 1,250 mg (01/17/2019), 850 mg (02/07/2019), 850 mg (02/28/2019)  for chemotherapy treatment.    03/26/2019 - 04/19/2019 Radiation Therapy   40.05 Gy in 15 fractions to the right breast with 2 tangential fields. Followed by a boost.     CHIEF COMPLIANT: Surveillance of breast cancer  HISTORY OF PRESENT ILLNESS:   History of Present Illness   The patient, with a history of breast cancer, presents for a routine follow-up. She reports no new health issues and has been feeling 'fine.' She had some initial issues with her inhaler, but has since found one that works well for her. She denies any breast pain or discomfort, but does note occasional 'little pains.' She recently saw Dr. Donell Beers, who performed a breast exam  and reassured her that everything was fine. Her mammograms were done in March and came out good. She has not made any changes to her medication list.        ALLERGIES:  has no  active allergies.  MEDICATIONS:  Current Outpatient Medications  Medication Sig Dispense Refill   albuterol (PROVENTIL HFA;VENTOLIN HFA) 108 (90 Base) MCG/ACT inhaler Inhale 1 puff into the lungs every 6 (six) hours as needed for wheezing or shortness of breath.     benzonatate (TESSALON PERLES) 100 MG capsule 1-2 tab by mouth evry 8 hr as needed 40 capsule 2   carvedilol (COREG) 25 MG tablet TAKE ONE-HALF TABLET BY MOUTH  TWICE DAILY 100 tablet 2   cholecalciferol (VITAMIN D3) 25 MCG (1000 UT) tablet Take 1,000 Units by mouth daily.     dapagliflozin propanediol (FARXIGA) 10 MG TABS tablet TAKE 1 TABLET BY MOUTH DAILY BEFORE BREAKFAST. 90 tablet 3   furosemide (LASIX) 40 MG tablet TAKE 1 TABLET BY MOUTH TWICE  DAILY 200 tablet 2   lidocaine-prilocaine (EMLA) cream APPLY TO AFFECTED AREA ONCE AS DIRECTED 30 g 3   meloxicam (MOBIC) 15 MG tablet Take 1 tablet (15 mg total) by mouth daily as needed for pain. 90 tablet 3   Multiple Vitamin (MULTIVITAMIN WITH MINERALS) TABS tablet Take 1 tablet by mouth daily.     nitroGLYCERIN (NITROLINGUAL) 0.4 MG/SPRAY spray Place 1 spray under the tongue every 5 (five) minutes x 3 doses as needed for chest pain. 12 g 3   rizatriptan (MAXALT) 10 MG tablet Take 10 mg by mouth as needed for migraine. May repeat in 2 hours if needed     rosuvastatin (CRESTOR) 20 MG tablet Take 1 tablet (20 mg total) by mouth daily. 90 tablet 3   sacubitril-valsartan (ENTRESTO) 24-26 MG Take 1 tablet by mouth 2 (two) times daily. 28 tablet    scopolamine (TRANSDERM-SCOP, 1.5 MG,) 1 MG/3DAYS Place 1 patch (1.5 mg total) onto the skin every 3 (three) days. 10 patch 0   spironolactone (ALDACTONE) 25 MG tablet Take 0.5 tablets (12.5 mg total) by mouth daily. 50 tablet 3   Tiotropium Bromide-Olodaterol (STIOLTO RESPIMAT) 2.5-2.5 MCG/ACT AERS INHALE 2 PUFFS BY MOUTH INTO THE LUNGS DAILY 4 g 11   tiZANidine (ZANAFLEX) 2 MG tablet TAKE 2 TABLETS BY MOUTH EVERY 6 HOURS AS NEEDED FOR MUSCLE  SPASM 60 tablet 2   traMADol (ULTRAM) 50 MG tablet Take 1 tablet (50 mg total) by mouth every 6 (six) hours as needed. 120 tablet 2   No current facility-administered medications for this visit.    PHYSICAL EXAMINATION: ECOG PERFORMANCE STATUS: 1 - Symptomatic but completely ambulatory  Vitals:   05/18/23 1534  BP: (!) 119/56  Pulse: 60  Resp: 18  Temp: 97.8 F (36.6 C)  SpO2: 100%   Filed Weights   05/18/23 1534  Weight: 222 lb 3.2 oz (100.8 kg)      LABORATORY DATA:  I have reviewed the data as listed    Latest Ref Rng & Units 02/07/2023   11:19 AM 12/01/2021    3:08 PM 09/27/2021    9:12 AM  CMP  Glucose 70 - 99 mg/dL 829  562  90   BUN 6 - 23 mg/dL 20  23  22    Creatinine 0.40 - 1.20 mg/dL 1.30  8.65  7.84   Sodium 135 - 145 mEq/L 136  137  141   Potassium 3.5 - 5.1 mEq/L 4.9  4.2  5.2  Chloride 96 - 112 mEq/L 101  101  104   CO2 19 - 32 mEq/L 27  27  24    Calcium 8.4 - 10.5 mg/dL 9.6  9.7  9.5   Total Protein 6.0 - 8.3 g/dL 7.5  7.8    Total Bilirubin 0.2 - 1.2 mg/dL 0.7  0.4    Alkaline Phos 39 - 117 U/L 77  77    AST 0 - 37 U/L 12  18    ALT 0 - 35 U/L 9  17      Lab Results  Component Value Date   WBC 9.6 02/07/2023   HGB 11.7 (L) 02/07/2023   HCT 37.6 02/07/2023   MCV 81.3 02/07/2023   PLT 172.0 02/07/2023   NEUTROABS 5.6 02/07/2023    ASSESSMENT & PLAN:  Malignant neoplasm of lower-outer quadrant of right breast of female, estrogen receptor negative (HCC) 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%, T1CN0 stage Ib   Review: Superior margin was broadly positive: Patient had surgery 10/23/2018: Benign Peripheral blood flow cytometry: Monoclonal B-cell population kappa restricted lymphocytes CD20 positive but lacks CD5, CD10, CD23 or CD103: B-cell lymphoproliferative process   Treatment plan: 1.  Adjuvant chemotherapy with CMF x6 cycles 11/15/2018-02/28/2019 2.   Followed by adjuvant radiation 03/26/2019-04/19/2019 ----------------------------------------------------------------------------------------------------------------------------------------------------- Breast cancer surveillance: 1.  Breast exam 05/18/2023 benign, tenderness in the right and left breast without any palpable nodularity. 2.  Mammogram  10/03/22: [facility]: Benign breast density category B   Severe fatigue: Significantly improved with changing her inhalers.       Breast Cancer Five years post-diagnosis, no current breast pain or discomfort. Recent examination by Dr. Donell Beers was normal. Mammograms in March showed less dense breasts (B), reducing the likelihood of missed abnormalities. -Continue current management plan. -Transition to as-needed basis for appointments.    No orders of the defined types were placed in this encounter.  The patient has a good understanding of the overall plan. she agrees with it. she will call with any problems that may develop before the next visit here. Total time spent: 30 mins including face to face time and time spent for planning, charting and co-ordination of care   Tamsen Meek, MD 05/18/23

## 2023-05-19 NOTE — Progress Notes (Signed)
Remote ICD transmission.   

## 2023-07-02 ENCOUNTER — Other Ambulatory Visit: Payer: Self-pay | Admitting: Cardiology

## 2023-07-02 DIAGNOSIS — I428 Other cardiomyopathies: Secondary | ICD-10-CM

## 2023-08-01 ENCOUNTER — Ambulatory Visit (INDEPENDENT_AMBULATORY_CARE_PROVIDER_SITE_OTHER): Payer: Medicare Other

## 2023-08-01 DIAGNOSIS — I428 Other cardiomyopathies: Secondary | ICD-10-CM

## 2023-08-01 DIAGNOSIS — I5022 Chronic systolic (congestive) heart failure: Secondary | ICD-10-CM

## 2023-08-02 ENCOUNTER — Telehealth (INDEPENDENT_AMBULATORY_CARE_PROVIDER_SITE_OTHER): Payer: Medicare Other | Admitting: Internal Medicine

## 2023-08-02 ENCOUNTER — Ambulatory Visit: Payer: Self-pay | Admitting: Internal Medicine

## 2023-08-02 DIAGNOSIS — J019 Acute sinusitis, unspecified: Secondary | ICD-10-CM | POA: Diagnosis not present

## 2023-08-02 DIAGNOSIS — E559 Vitamin D deficiency, unspecified: Secondary | ICD-10-CM

## 2023-08-02 DIAGNOSIS — N1831 Chronic kidney disease, stage 3a: Secondary | ICD-10-CM | POA: Diagnosis not present

## 2023-08-02 DIAGNOSIS — R739 Hyperglycemia, unspecified: Secondary | ICD-10-CM

## 2023-08-02 LAB — CUP PACEART REMOTE DEVICE CHECK
Battery Remaining Longevity: 65 mo
Battery Voltage: 2.99 V
Brady Statistic RV Percent Paced: 0.01 %
Date Time Interrogation Session: 20250121043826
HighPow Impedance: 40 Ohm
HighPow Impedance: 51 Ohm
Implantable Lead Connection Status: 753985
Implantable Lead Implant Date: 20081215
Implantable Lead Location: 753860
Implantable Lead Model: 6947
Implantable Pulse Generator Implant Date: 20190418
Lead Channel Impedance Value: 342 Ohm
Lead Channel Impedance Value: 437 Ohm
Lead Channel Pacing Threshold Amplitude: 1 V
Lead Channel Pacing Threshold Pulse Width: 0.4 ms
Lead Channel Sensing Intrinsic Amplitude: 15.375 mV
Lead Channel Sensing Intrinsic Amplitude: 15.375 mV
Lead Channel Setting Pacing Amplitude: 2.5 V
Lead Channel Setting Pacing Pulse Width: 0.4 ms
Lead Channel Setting Sensing Sensitivity: 0.3 mV
Zone Setting Status: 755011
Zone Setting Status: 755011

## 2023-08-02 MED ORDER — LEVOFLOXACIN 500 MG PO TABS: 500.0000 mg | ORAL_TABLET | Freq: Every day | ORAL | 0 refills | Status: DC

## 2023-08-02 MED ORDER — BENZONATATE 100 MG PO CAPS: ORAL_CAPSULE | ORAL | 2 refills | Status: DC

## 2023-08-02 NOTE — Progress Notes (Signed)
Patient ID: [name] [name], female   DOB: December 27, 1940, 83 y.o.   MRN: [id number]  Virtual Visit via Video Note  I connected with [name] [name] on 08/05/23 at  3:40 PM EST by a video enabled telemedicine application and verified that I am speaking with the correct person using two identifiers.  Location of all participants today Patient: at home Provider: at office   I discussed the limitations of evaluation and management by telemedicine and the availability of in person appointments. The patient expressed understanding and agreed to proceed.  History of Present Illness:  Here with 2-3 days acute onset fever, facial pain, pressure, headache, general weakness and malaise, and greenish d/c, with mild ST and cough, but pt denies chest pain, wheezing, increased sob or doe, orthopnea, PND, increased LE swelling, palpitations, dizziness or syncope.   Pt denies polydipsia, polyuria, or new focal neuro s/s.    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   OVERACTIVE BLADDER 08/03/2010   PYELONEPHRITIS, HX OF 05/07/2007   SYSTOLIC HEART FAILURE, CHRONIC 12/28/2008   VITAMIN D DEFICIENCY 06/17/2008   Past Surgical History:  Procedure Laterality Date   ABDOMINAL HYSTERECTOMY     BREAST LUMPECTOMY WITH RADIOACTIVE SEED AND SENTINEL LYMPH NODE BIOPSY Right 10/11/2018   Procedure: RIGHT BREAST LUMPECTOMY WITH RADIOACTIVE SEED AND SENTINEL LYMPH NODE  BIOPSY;  Surgeon: Almond Lint, MD;  Location: [facility] OR;  Service: General;  Laterality: Right;   ICD GENERATOR CHANGEOUT N/A 10/25/2017   Procedure: ICD GENERATOR CHANGEOUT;  Surgeon: Duke Salvia, MD;  Location: [facility] INVASIVE CV LAB;  Service: Cardiovascular;  Laterality: N/A;   LAMINECTOMY     PORT-A-CATH REMOVAL N/A 05/08/2019   Procedure: REMOVAL OF PORT-A-CATH RIGHT CHEST;  Surgeon: Almond Lint, MD;  Location: [facility] OR;  Service: General;  Laterality: N/A;   PORTACATH PLACEMENT N/A 10/11/2018   Procedure: INSERTION PORT-A-CATH;  Surgeon: Almond Lint, MD;  Location: [facility] OR;  Service: General;  Laterality: N/A;   RE-EXCISION OF BREAST LUMPECTOMY Right 10/23/2018   Procedure: RE-EXCISION OF RIGHT BREAST LUMPECTOMY;  Surgeon: Almond Lint, MD;  Location: [facility] OR;  Service: General;  Laterality: Right;    reports that she has never smoked. She has never used smokeless tobacco. She reports that she does not drink alcohol and does not use drugs. family history includes Arthritis in an other family member; Coronary artery disease in an other family member; Heart disease in her mother; Kidney disease in an other family member. No Active Allergies Current Outpatient Medications on File Prior to Visit  Medication Sig Dispense Refill   albuterol (PROVENTIL HFA;VENTOLIN HFA) 108 (90 Base) MCG/ACT inhaler Inhale 1 puff into the lungs every 6 (six) hours as needed for wheezing or shortness of breath.     carvedilol (COREG) 25 MG tablet TAKE ONE-HALF TABLET BY MOUTH  TWICE DAILY 100 tablet 2   cholecalciferol (VITAMIN D3) 25 MCG (1000 UT) tablet Take 1,000 Units by mouth daily.  dapagliflozin propanediol (FARXIGA) 10 MG TABS tablet TAKE 1 TABLET BY MOUTH DAILY BEFORE BREAKFAST. 90 tablet 3   furosemide (LASIX) 40 MG tablet TAKE 1 TABLET BY MOUTH TWICE  DAILY 200 tablet 2   lidocaine-prilocaine (EMLA) cream APPLY TO AFFECTED AREA ONCE AS DIRECTED 30 g 3   meloxicam (MOBIC) 15 MG tablet Take 1 tablet (15 mg  total) by mouth daily as needed for pain. 90 tablet 3   Multiple Vitamin (MULTIVITAMIN WITH MINERALS) TABS tablet Take 1 tablet by mouth daily.     nitroGLYCERIN (NITROLINGUAL) 0.4 MG/SPRAY spray Place 1 spray under the tongue every 5 (five) minutes x 3 doses as needed for chest pain. 12 g 3   rizatriptan (MAXALT) 10 MG tablet Take 10 mg by mouth as needed for migraine. May repeat in 2 hours if needed     rosuvastatin (CRESTOR) 20 MG tablet Take 1 tablet (20 mg total) by mouth daily. 90 tablet 3   sacubitril-valsartan (ENTRESTO) 24-26 MG TAKE 1 TABLET BY MOUTH TWICE A DAY 180 tablet 1   scopolamine (TRANSDERM-SCOP, 1.5 MG,) 1 MG/3DAYS Place 1 patch (1.5 mg total) onto the skin every 3 (three) days. 10 patch 0   spironolactone (ALDACTONE) 25 MG tablet Take 0.5 tablets (12.5 mg total) by mouth daily. 50 tablet 3   Tiotropium Bromide-Olodaterol (STIOLTO RESPIMAT) 2.5-2.5 MCG/ACT AERS INHALE 2 PUFFS BY MOUTH INTO THE LUNGS DAILY 4 g 11   tiZANidine (ZANAFLEX) 2 MG tablet TAKE 2 TABLETS BY MOUTH EVERY 6 HOURS AS NEEDED FOR MUSCLE SPASM 60 tablet 2   traMADol (ULTRAM) 50 MG tablet Take 1 tablet (50 mg total) by mouth every 6 (six) hours as needed. 120 tablet 2   No current facility-administered medications on file prior to visit.    Observations/Objective: Alert, mild ill, appropriate mood and affect, resps normal, cn 2-12 intact, moves all 4s, no visible rash or swelling Lab Results  Component Value Date   WBC 9.6 02/07/2023   HGB 11.7 (L) 02/07/2023   HCT 37.6 02/07/2023   PLT 172.0 02/07/2023   GLUCOSE 102 (H) 02/07/2023   CHOL 112 02/07/2023   TRIG 80.0 02/07/2023   HDL 48.20 02/07/2023   LDLDIRECT 129.7 02/07/2013   LDLCALC 47 02/07/2023   ALT 9 02/07/2023   AST 12 02/07/2023   NA 136 02/07/2023   K 4.9 02/07/2023   CL 101 02/07/2023   CREATININE 1.63 (H) 02/07/2023   BUN 20 02/07/2023   CO2 27 02/07/2023   TSH 0.81 02/07/2023   INR 0.9 RATIO 06/18/2007   HGBA1C 6.2 02/07/2023    MICROALBUR 1.1 02/07/2023    Assessment and Plan: See notes  Follow Up Instructions: See notes   I discussed the assessment and treatment plan with the patient. The patient was provided an opportunity to ask questions and all were answered. The patient agreed with the plan and demonstrated an understanding of the instructions.   The patient was advised to call back or seek an in-person evaluation if the symptoms worsen or if the condition fails to improve as anticipated.   Oliver Barre, MD

## 2023-08-02 NOTE — Telephone Encounter (Signed)
  Chief Complaint: Sinus pressure, congestion Symptoms: Sinus pressure/pain, congestion Frequency: Ongoing since Sunday Pertinent Negatives: Patient denies SOB Disposition: []ED /[]Urgent Care (no appt availability in office) / [x]Appointment(In office/virtual)/ [] [place] [facility]/ []Home Care/ []Refused Recommended Disposition /[][facility]/ [] Follow-up with PCP Additional Notes: Pt reports she began with symptoms this past Sunday, she notes a fever of 102 that evening but fever has since resolved and not recurred. Pt reports she has severe 10/10 pain in her left face, eye, ear. She notes she has been using OTC nasal spray and Tylenol with little to no relief. Pt denies SOB but reports she is having to breathe through her mouth due to the sinus congestion. Pt notes drainage is clear but a few times she did note scant amounts of blood. Pt scheduled for virtual OV today. This RN educated pt on home care, new-worsening symptoms, when to call back/seek emergent care. Pt verbalized understanding and agrees to plan.   Copied from CRM #[id number]. Topic: Clinical - Red Word Triage >> Aug 02, 2023 12:59 PM Wametria H wrote: Red Word that prompted transfer to Nurse Triage: Patient states she has a bad back ache, congested can\'t breathe out of nose. Eye, throat and ear hurting on left side. Reason for Disposition  [1] SEVERE pain AND [2] not improved 2 hours after pain medicine  Answer Assessment - Initial Assessment Questions 1. LOCATION: "Where does it hurt?"      Left eye, throat and ear pain, sinus pressure 2. ONSET: "When did the sinus pain start?"  (e.g., hours, days)      Last Sunday 3. SEVERITY: "How bad is the pain?"   (Scale 1-10; mild, moderate or severe)   - MILD (1-3): doesn\'t interfere with normal activities    - MODERATE (4-7): interferes with normal activities (e.g., work or school) or awakens from sleep   - SEVERE (8-10): excruciating pain and patient unable to do any  normal activities        10 /10 5. NASAL CONGESTION: "Is the nose blocked?" If Yes, ask: "Can you open it or must you breathe through your mouth?"     Yes 6. NASAL DISCHARGE: "Do you have discharge from your nose?" If so ask, "What color?"     Mostly clear, small amount of blood a few times 7. FEVER: "Do you have a fever?" If Yes, ask: "What is it, how was it measured, and when did it start?"      Sunday evening 102 8. OTHER SYMPTOMS: "Do you have any other symptoms?" (e.g., sore throat, cough, earache, difficulty breathing)     Sore throat, earache, not SOB but mouth breathing  Protocols used: Sinus Pain or Congestion-A-AH

## 2023-08-05 ENCOUNTER — Encounter: Payer: Self-pay | Admitting: Internal Medicine

## 2023-08-05 NOTE — Assessment & Plan Note (Signed)
Last vitamin D Lab Results  Component Value Date   VD25OH 42.22 02/07/2023   Stable, cont oral replacement

## 2023-08-05 NOTE — Patient Instructions (Signed)
Please take all new medication as prescribed

## 2023-08-05 NOTE — Assessment & Plan Note (Signed)
Mild to mod, for antibx course levaquin 500 every day,  to f/u any worsening symptoms or concerns

## 2023-08-05 NOTE — Assessment & Plan Note (Signed)
Lab Results  Component Value Date   CREATININE 1.63 (H) 02/07/2023   Stable overall, cont to avoid nephrotoxins, cont farxiga 10 d

## 2023-08-05 NOTE — Assessment & Plan Note (Signed)
Lab Results  Component Value Date   HGBA1C 6.2 02/07/2023   Stable, pt to continue current medical treatment farxiga 10 qd

## 2023-08-10 ENCOUNTER — Encounter: Payer: Self-pay | Admitting: Internal Medicine

## 2023-08-10 ENCOUNTER — Ambulatory Visit (INDEPENDENT_AMBULATORY_CARE_PROVIDER_SITE_OTHER): Payer: Medicare Other | Admitting: Internal Medicine

## 2023-08-10 VITALS — BP 120/72 | HR 71 | Temp 98.6°F | Ht 67.0 in | Wt 217.0 lb

## 2023-08-10 DIAGNOSIS — Z0001 Encounter for general adult medical examination with abnormal findings: Secondary | ICD-10-CM

## 2023-08-10 DIAGNOSIS — E538 Deficiency of other specified B group vitamins: Secondary | ICD-10-CM

## 2023-08-10 DIAGNOSIS — R739 Hyperglycemia, unspecified: Secondary | ICD-10-CM | POA: Diagnosis not present

## 2023-08-10 DIAGNOSIS — E7800 Pure hypercholesterolemia, unspecified: Secondary | ICD-10-CM

## 2023-08-10 DIAGNOSIS — N1831 Chronic kidney disease, stage 3a: Secondary | ICD-10-CM

## 2023-08-10 DIAGNOSIS — Z0000 Encounter for general adult medical examination without abnormal findings: Secondary | ICD-10-CM | POA: Diagnosis not present

## 2023-08-10 DIAGNOSIS — I10 Essential (primary) hypertension: Secondary | ICD-10-CM | POA: Diagnosis not present

## 2023-08-10 DIAGNOSIS — E559 Vitamin D deficiency, unspecified: Secondary | ICD-10-CM

## 2023-08-10 DIAGNOSIS — H6592 Unspecified nonsuppurative otitis media, left ear: Secondary | ICD-10-CM

## 2023-08-10 MED ORDER — CEFDINIR 300 MG PO CAPS: 300.0000 mg | ORAL_CAPSULE | Freq: Two times a day (BID) | ORAL | 0 refills | Status: DC

## 2023-08-10 NOTE — Patient Instructions (Signed)
Please take all new medication as prescribed - the antibiotic  You can also take Delsym OTC for cough, and/or Mucinex (or it's generic off brand) for congestion, and tylenol as needed for pain.  Please continue all other medications as before, and refills have been done if requested.  Please have the pharmacy call with any other refills you may need.  Please continue your efforts at being more active, low cholesterol diet, and weight control.  You are otherwise up to date with prevention measures today.  Please keep your appointments with your specialists as you may have planned  Please go to the LAB at the blood drawing area for the tests to be done  You will be contacted by phone if any changes need to be made immediately.  Otherwise, you will receive a letter about your results with an explanation, but please check with MyChart first.  Please make an Appointment to return in 6 months, or sooner if needed

## 2023-08-10 NOTE — Progress Notes (Signed)
Patient ID: [name] [name], female   DOB: [birth year], 83 y.o.   MRN: [id number]         Chief Complaint:: wellness exam and persistent left ear pain, low b12, hyperglycemia, hld, low vit d, htn, ckd3a       HPI:  [name] [name] is a 83 y.o. female here for wellness exam; declines covid booster, for shingrix at pharmacy, declines dxa, o/w up to date                Also c/o recent URI with ST now improved, but unfortunately now worsening left ear pain and feverish for 3 days.  Pt denies chest pain, increased sob or doe, wheezing, orthopnea, PND, increased LE swelling, palpitations, dizziness or syncope.   Pt denies polydipsia, polyuria, or new focal neuro s/s.    Pt denies recent wt loss, night sweats, loss of appetite, or other constitutional symptoms     Wt Readings from Last 3 Encounters:  08/10/23 217 lb (98.4 kg)  05/18/23 222 lb 3.2 oz (100.8 kg)  03/30/23 216 lb (98 kg)   BP Readings from Last 3 Encounters:  08/10/23 120/72  05/18/23 (!) 119/56  02/07/23 120/70   Immunization History  Administered Date(s) Administered   Fluad Quad(high Dose 65+) 05/31/2019, 03/23/2022   Influenza-Unspecified 03/18/2021   PFIZER(Purple Top)SARS-COV-2 Vaccination 08/28/2019, 09/18/2019, 04/24/2020, 04/24/2020, 04/03/2021   Pneumococcal Conjugate-13 02/07/2013   Pneumococcal Polysaccharide-23 05/01/2014   Tdap 05/22/2018   Health Maintenance Due  Topic Date Due   Zoster Vaccines- Shingrix (1 of 2) Never done   DEXA SCAN  Never done   COVID-19 Vaccine (6 - 2024-25 season) 03/12/2023      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   GERD 05/07/2007   History of kidney stones    HYPERLIPIDEMIA 05/23/2007   HYPERTENSION 05/07/2007    KELOID SCAR, 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   MENOPAUSAL DISORDER 05/06/2008   OSTEOARTHRITIS, KNEES, 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   OVERACTIVE BLADDER 08/03/2010   PYELONEPHRITIS, HX OF 05/07/2007   SYSTOLIC HEART FAILURE, CHRONIC 12/28/2008   VITAMIN D DEFICIENCY 06/17/2008   Past Surgical History:  Procedure Laterality Date   ABDOMINAL HYSTERECTOMY     BREAST LUMPECTOMY WITH RADIOACTIVE SEED AND SENTINEL LYMPH NODE BIOPSY Right 10/11/2018   Procedure: RIGHT BREAST LUMPECTOMY WITH RADIOACTIVE SEED AND SENTINEL LYMPH NODE BIOPSY;  Surgeon: Almond Lint, MD;  Location: [facility] OR;  Service: General;  Laterality: Right;   ICD GENERATOR CHANGEOUT N/A 10/25/2017   Procedure: ICD GENERATOR CHANGEOUT;  Surgeon: Duke Salvia, MD;  Location: [facility] INVASIVE CV LAB;  Service: Cardiovascular;  Laterality: N/A;   LAMINECTOMY     PORT-A-CATH REMOVAL N/A 05/08/2019   Procedure: REMOVAL OF PORT-A-CATH RIGHT CHEST;  Surgeon: Almond Lint, MD;  Location: [facility] OR;  Service: General;  Laterality: N/A;   PORTACATH PLACEMENT N/A 10/11/2018   Procedure: INSERTION PORT-A-CATH;  Surgeon: Almond Lint, MD;  Location: [facility] OR;  Service: General;  Laterality: N/A;   RE-EXCISION OF BREAST LUMPECTOMY Right 10/23/2018   Procedure: RE-EXCISION OF RIGHT BREAST LUMPECTOMY;  Surgeon: Almond Lint, MD;  Location: [facility] OR;  Service: General;  Laterality: Right;    reports that she has never smoked. She has never used smokeless tobacco. She reports that she does not drink alcohol and  does not use drugs. family history includes Arthritis in an other family member; Coronary artery disease in an other family member; Heart disease in her mother; Kidney disease in an other family member. No Active Allergies Current Outpatient Medications on File Prior to Visit  Medication Sig Dispense Refill   albuterol (PROVENTIL HFA;VENTOLIN HFA) 108 (90 Base) MCG/ACT inhaler Inhale 1 puff into the lungs every 6 (six) hours as needed for wheezing or shortness of breath.      benzonatate (TESSALON PERLES) 100 MG capsule 1-2 tab by mouth evry 8 hr as needed 40 capsule 2   carvedilol (COREG) 25 MG tablet TAKE ONE-HALF TABLET BY MOUTH  TWICE DAILY 100 tablet 2   cholecalciferol (VITAMIN D3) 25 MCG (1000 UT) tablet Take 1,000 Units by mouth daily.     dapagliflozin propanediol (FARXIGA) 10 MG TABS tablet TAKE 1 TABLET BY MOUTH DAILY BEFORE BREAKFAST. 90 tablet 3   furosemide (LASIX) 40 MG tablet TAKE 1 TABLET BY MOUTH TWICE  DAILY 200 tablet 2   lidocaine-prilocaine (EMLA) cream APPLY TO AFFECTED AREA ONCE AS DIRECTED 30 g 3   meloxicam (MOBIC) 15 MG tablet Take 1 tablet (15 mg total) by mouth daily as needed for pain. 90 tablet 3   Multiple Vitamin (MULTIVITAMIN WITH MINERALS) TABS tablet Take 1 tablet by mouth daily.     nitroGLYCERIN (NITROLINGUAL) 0.4 MG/SPRAY spray Place 1 spray under the tongue every 5 (five) minutes x 3 doses as needed for chest pain. 12 g 3   rizatriptan (MAXALT) 10 MG tablet Take 10 mg by mouth as needed for migraine. May repeat in 2 hours if needed     rosuvastatin (CRESTOR) 20 MG tablet Take 1 tablet (20 mg total) by mouth daily. 90 tablet 3   sacubitril-valsartan (ENTRESTO) 24-26 MG TAKE 1 TABLET BY MOUTH TWICE A DAY 180 tablet 1   scopolamine (TRANSDERM-SCOP, 1.5 MG,) 1 MG/3DAYS Place 1 patch (1.5 mg total) onto the skin every 3 (three) days. 10 patch 0   spironolactone (ALDACTONE) 25 MG tablet Take 0.5 tablets (12.5 mg total) by mouth daily. 50 tablet 3   Tiotropium Bromide-Olodaterol (STIOLTO RESPIMAT) 2.5-2.5 MCG/ACT AERS INHALE 2 PUFFS BY MOUTH INTO THE LUNGS DAILY 4 g 11   tiZANidine (ZANAFLEX) 2 MG tablet TAKE 2 TABLETS BY MOUTH EVERY 6 HOURS AS NEEDED FOR MUSCLE SPASM 60 tablet 2   traMADol (ULTRAM) 50 MG tablet Take 1 tablet (50 mg total) by mouth every 6 (six) hours as needed. 120 tablet 2   No current facility-administered medications on file prior to visit.        ROS:  All others reviewed and negative.  Objective        PE:   BP 120/72 (BP Location: Left Arm, Patient Position: Sitting, Cuff Size: Normal)   Pulse 71   Temp 98.6 F (37 C) (Oral)   Ht 5\' 7"  (1.702 m)   Wt 217 lb (98.4 kg)   SpO2 99%   BMI 33.99 kg/m                 Constitutional: Pt appears in NAD               HENT: Head: NCAT.                Right Ear: External ear normal.                 Left Ear: External ear normal. Left TM with severe red, effusion  Eyes: . Pupils are equal, round, and reactive to light. Conjunctivae and EOM are normal               Nose: without d/c or deformity               Neck: Neck supple. Gross normal ROM               Cardiovascular: Normal rate and regular rhythm.                 Pulmonary/Chest: Effort normal and breath sounds without rales or wheezing.                Abd:  Soft, NT, ND, + BS, no organomegaly               Neurological: Pt is alert. At baseline orientation, motor grossly intact               Skin: Skin is warm. No rashes, no other new lesions, LE edema - none               Psychiatric: Pt behavior is normal without agitation   Micro: none  Cardiac tracings I have personally interpreted today:  none  Pertinent Radiological findings (summarize): none   Lab Results  Component Value Date   WBC 9.6 02/07/2023   HGB 11.7 (L) 02/07/2023   HCT 37.6 02/07/2023   PLT 172.0 02/07/2023   GLUCOSE 102 (H) 02/07/2023   CHOL 112 02/07/2023   TRIG 80.0 02/07/2023   HDL 48.20 02/07/2023   LDLDIRECT 129.7 02/07/2013   LDLCALC 47 02/07/2023   ALT 9 02/07/2023   AST 12 02/07/2023   NA 136 02/07/2023   K 4.9 02/07/2023   CL 101 02/07/2023   CREATININE 1.63 (H) 02/07/2023   BUN 20 02/07/2023   CO2 27 02/07/2023   TSH 0.81 02/07/2023   INR 0.9 RATIO 06/18/2007   HGBA1C 6.2 02/07/2023   MICROALBUR 1.1 02/07/2023   Assessment/Plan:  [name] [name] is a 83 y.o. Black or African American [2] female with  has a past medical history of AICD (automatic cardioverter/defibrillator)  present, ALLERGIC RHINITIS (11/06/2007), ANEMIA-IRON DEFICIENCY (05/23/2007), ANEMIA-NOS (06/17/2008), ANXIETY (02/22/2008), CARDIOMYOPATHY, PRIMARY, DILATED (12/30/2008), DEGENERATIVE DISC DISEASE, LUMBAR SPINE (05/07/2007), GERD (05/07/2007), History of kidney stones, HYPERLIPIDEMIA (05/23/2007), HYPERTENSION (05/07/2007), KELOID SCAR, HX OF (02/02/2009), MENOPAUSAL DISORDER (05/06/2008), OSTEOARTHRITIS, KNEES, BILATERAL (05/23/2007), OVERACTIVE BLADDER (08/03/2010), PYELONEPHRITIS, HX OF (05/07/2007), SYSTOLIC HEART FAILURE, CHRONIC (12/28/2008), and VITAMIN D DEFICIENCY (06/17/2008).  Encounter for well adult exam with abnormal findings Age and sex appropriate education and counseling updated with regular exercise and diet Referrals for preventative services - declines dxa Immunizations addressed - declines covid booster, for shingrix at pharmacy Smoking counseling  - none needed Evidence for depression or other mood disorder - none significant Most recent labs reviewed. I have personally reviewed and have noted: 1) the patient's medical and social history 2) The patient's current medications and supplements 3) The patient's height, weight, and BMI have been recorded in the chart   B12 deficiency Lab Results  Component Value Date   VITAMINB12 185 (L) 02/07/2023   Low, to start oral replacement - b12 1000 mcg qd   CKD (chronic kidney disease) stage 3, GFR 30-59 ml/min Lab Results  Component Value Date   CREATININE 1.63 (H) 02/07/2023   Stable overall, cont to avoid nephrotoxins   Essential hypertension BP Readings from Last 3 Encounters:  08/10/23 120/72  05/18/23 (!) 119/56  02/07/23 120/70  Stable, pt to continue medical treatment coreg 25 bid   Hyperglycemia Lab Results  Component Value Date   HGBA1C 6.2 02/07/2023   Stable, pt to continue current medical treatment farxiga 10 qd   Hyperlipidemia Lab Results  Component Value Date   LDLCALC 47 02/07/2023   Stable, pt  to continue current statin crestor 20 qd   Left otitis media with effusion Mild to mod, for antibx course omnicef 250 bid,,  to f/u any worsening symptoms or concerns  Vitamin D deficiency Last vitamin D Lab Results  Component Value Date   VD25OH 42.22 02/07/2023   Stable, cont oral replacement  Followup: Return in about 6 months (around 02/07/2024).  Oliver Barre, MD 08/13/2023 8:15 PM [facility]  Primary Care - [facility]

## 2023-08-11 ENCOUNTER — Other Ambulatory Visit: Payer: Self-pay | Admitting: Cardiology

## 2023-08-11 DIAGNOSIS — I428 Other cardiomyopathies: Secondary | ICD-10-CM

## 2023-08-13 ENCOUNTER — Encounter: Payer: Self-pay | Admitting: Internal Medicine

## 2023-08-13 NOTE — Assessment & Plan Note (Signed)
Lab Results  Component Value Date   VITAMINB12 185 (L) 02/07/2023   Low, to start oral replacement - b12 1000 mcg qd

## 2023-08-13 NOTE — Assessment & Plan Note (Signed)
Lab Results  Component Value Date   CREATININE 1.63 (H) 02/07/2023   Stable overall, cont to avoid nephrotoxins

## 2023-08-13 NOTE — Assessment & Plan Note (Signed)
Age and sex appropriate education and counseling updated with regular exercise and diet Referrals for preventative services - declines dxa Immunizations addressed - declines covid booster, for shingrix at pharmacy Smoking counseling  - none needed Evidence for depression or other mood disorder - none significant Most recent labs reviewed. I have personally reviewed and have noted: 1) the patient's medical and social history 2) The patient's current medications and supplements 3) The patient's height, weight, and BMI have been recorded in the chart

## 2023-08-13 NOTE — Assessment & Plan Note (Signed)
Mild to mod, for antibx course omnicef 250 bid,,  to f/u any worsening symptoms or concerns

## 2023-08-13 NOTE — Assessment & Plan Note (Signed)
 Lab Results  Component Value Date   HGBA1C 6.2 02/07/2023   Stable, pt to continue current medical treatment farxiga 10 qd

## 2023-08-13 NOTE — Assessment & Plan Note (Signed)
Lab Results  Component Value Date   LDLCALC 47 02/07/2023   Stable, pt to continue current statin crestor 20 qd

## 2023-08-13 NOTE — Assessment & Plan Note (Signed)
BP Readings from Last 3 Encounters:  08/10/23 120/72  05/18/23 (!) 119/56  02/07/23 120/70   Stable, pt to continue medical treatment coreg 25 bid

## 2023-08-13 NOTE — Assessment & Plan Note (Signed)
 Last vitamin D Lab Results  Component Value Date   VD25OH 42.22 02/07/2023   Stable, cont oral replacement

## 2023-08-14 ENCOUNTER — Other Ambulatory Visit: Payer: Self-pay | Admitting: Internal Medicine

## 2023-08-14 DIAGNOSIS — I10 Essential (primary) hypertension: Secondary | ICD-10-CM

## 2023-08-14 DIAGNOSIS — E7800 Pure hypercholesterolemia, unspecified: Secondary | ICD-10-CM

## 2023-08-14 DIAGNOSIS — R739 Hyperglycemia, unspecified: Secondary | ICD-10-CM

## 2023-08-14 DIAGNOSIS — E559 Vitamin D deficiency, unspecified: Secondary | ICD-10-CM

## 2023-08-14 DIAGNOSIS — N1831 Chronic kidney disease, stage 3a: Secondary | ICD-10-CM

## 2023-08-14 DIAGNOSIS — E538 Deficiency of other specified B group vitamins: Secondary | ICD-10-CM

## 2023-08-17 ENCOUNTER — Other Ambulatory Visit: Payer: Self-pay

## 2023-08-17 ENCOUNTER — Other Ambulatory Visit: Payer: Self-pay | Admitting: Internal Medicine

## 2023-08-17 DIAGNOSIS — C50511 Malignant neoplasm of lower-outer quadrant of right female breast: Secondary | ICD-10-CM

## 2023-09-06 ENCOUNTER — Encounter: Payer: Self-pay | Admitting: Internal Medicine

## 2023-09-12 ENCOUNTER — Ambulatory Visit: Payer: Medicare Other | Admitting: Pulmonary Disease

## 2023-09-12 ENCOUNTER — Encounter: Payer: Self-pay | Admitting: Pulmonary Disease

## 2023-09-12 VITALS — BP 109/63 | HR 63 | Ht 67.0 in | Wt 227.4 lb

## 2023-09-12 DIAGNOSIS — H6992 Unspecified Eustachian tube disorder, left ear: Secondary | ICD-10-CM

## 2023-09-12 DIAGNOSIS — J4540 Moderate persistent asthma, uncomplicated: Secondary | ICD-10-CM

## 2023-09-12 DIAGNOSIS — R0609 Other forms of dyspnea: Secondary | ICD-10-CM | POA: Diagnosis not present

## 2023-09-12 MED ORDER — STIOLTO RESPIMAT 2.5-2.5 MCG/ACT IN AERS: 2.0000 | INHALATION_SPRAY | Freq: Every day | RESPIRATORY_TRACT | 12 refills | Status: AC

## 2023-09-12 MED ORDER — AMOXICILLIN-POT CLAVULANATE 875-125 MG PO TABS: 1.0000 | ORAL_TABLET | Freq: Two times a day (BID) | ORAL | 0 refills | Status: AC

## 2023-09-12 MED ORDER — STIOLTO RESPIMAT 2.5-2.5 MCG/ACT IN AERS: 2.0000 | INHALATION_SPRAY | Freq: Two times a day (BID) | RESPIRATORY_TRACT | 12 refills | Status: DC

## 2023-09-12 NOTE — Addendum Note (Signed)
 Addended by: Geralyn Flash D on: 09/12/2023 04:17 PM   Modules accepted: Orders

## 2023-09-12 NOTE — Patient Instructions (Signed)
 Nice to see you again  I refilled the Stiolto for 1 year  Augmentin 1 tablet twice a day for 10 days for the ear, the left would like a little fluid but overall not too bad, right looks clear  Referral to an ENT doctor for the ears  Return to clinic in 1 year or sooner as needed with Dr. Judeth Horn

## 2023-09-12 NOTE — Progress Notes (Signed)
 Remote ICD transmission.

## 2023-09-12 NOTE — Progress Notes (Signed)
 @Patient  ID: [name] [name], female    DOB: [birth year], 83 y.o.   MRN: [id number]  Chief Complaint  Patient presents with   Follow-up    Pt has sinus infection w/ ear ache. Sob has improved, no other concerns     Referring provider: Corwin Levins, MD  HPI:   83 y.o. woman whom we are seeing in follow up for evaluation of dyspnea on exertion.  Most recent PCP note reviewed.  Most recent cardiology note reviewed.  Most recent hematology note reviewed.  Doing better. DOE better.  Stiolto continues to be beneficial.  Lungs clear today.  She feels her breathing is doing well.  Complains of ongoing ear pain on the left primarily.  Seen by PCP recently prescribed cefdinir.  Still ongoing discomfort.  Mild effusion although appears noninflammatory on the left, right clear.  Discussed trying antibiotics.  Referring to ENT.  Bad experience with ENT doctor in the past, she agreed to referral to a new one today.  HPI at initial visit: Overall, she is doing relatively well.  She endorses chronic dyspnea on exertion.  Worse on inclines or stairs.  No time of day when things are better or worse.  No seasonal or environmental factors make things better or worse.  No position to make things better or worse.  Has albuterol as needed and this does seem to help a bit.  Was on ICS/LABA inhaler but did not like the way it tasted.  Did not like rinsing out her mouth.  Maybe helped a little bit.  Reviewed most recent chest imaging CT chest high-resolution 04/2020 that on my review interpretation shows basilar right greater than left mild bronchiectasis with small nodule.  CPET 09/2020 reviewed that shows limitation of her body habitus, hypoventilation, chronotropic incompetence.  Spirometry prior to that CPET demonstrated normal spirometry.  Most recent echocardiogram 09/2018 reviewed demonstrating EF of 20%, dilated LA.  PMH: Cardiomyopathy, history of breast cancer in remission Surgical history: Hysterectomy,  lumpectomy for breast cancer Family history: Mother with CAD Social history: Never smoker, lives in [place] / Pulmonary Flowsheets:   ACT:      No data to display          MMRC:     No data to display          Epworth:      No data to display          Tests:   FENO:  No results found for: "NITRICOXIDE"  PFT:     No data to display          WALK:      No data to display          Imaging: Personally reviewed and as per EMR discussion this note No results found.  Lab Results: Personally reviewed CBC    Component Value Date/Time   WBC 9.6 02/07/2023 1119   RBC 4.62 02/07/2023 1119   HGB 11.7 (L) 02/07/2023 1119   HGB 10.1 (L) 02/28/2019 0941   HGB 10.8 (L) 10/11/2017 1252   HCT 37.6 02/07/2023 1119   HCT 33.9 (L) 10/11/2017 1252   PLT 172.0 02/07/2023 1119   PLT 290 02/28/2019 0941   PLT 226 10/11/2017 1252   MCV 81.3 02/07/2023 1119   MCV 79 10/11/2017 1252   MCH 26.3 05/03/2019 0826   MCHC 31.2 02/07/2023 1119   RDW 16.0 (H) 02/07/2023 1119   RDW 16.7 (H) 10/11/2017 1252   LYMPHSABS  3.1 02/07/2023 1119   MONOABS 0.8 02/07/2023 1119   EOSABS 0.1 02/07/2023 1119   BASOSABS 0.0 02/07/2023 1119    BMET    Component Value Date/Time   NA 136 02/07/2023 1119   NA 141 09/27/2021 0912   K 4.9 02/07/2023 1119   CL 101 02/07/2023 1119   CO2 27 02/07/2023 1119   GLUCOSE 102 (H) 02/07/2023 1119   BUN 20 02/07/2023 1119   BUN 22 09/27/2021 0912   CREATININE 1.63 (H) 02/07/2023 1119   CREATININE 1.34 (H) 02/28/2019 0941   CREATININE 1.54 (H) 12/11/2015 1001   CALCIUM 9.6 02/07/2023 1119   GFRNONAA 34 (L) 04/03/2020 1635   GFRNONAA 38 (L) 02/28/2019 0941   GFRAA 39 (L) 04/03/2020 1635   GFRAA 44 (L) 02/28/2019 0941    BNP    Component Value Date/Time   BNP 366.8 (H) 05/19/2017 1235    ProBNP    Component Value Date/Time   PROBNP 55.2 08/20/2010 0935    Specialty Problems       Pulmonary Problems    Allergic rhinitis   Qualifier: Diagnosis of  By: Jonny Ruiz MD, Len Blalock       Acute sinusitis   Wheezing   Pulmonary nodule   Acute cough    No Active Allergies   Immunization History  Administered Date(s) Administered   Fluad Quad(high Dose 65+) 05/31/2019, 03/23/2022   Influenza-Unspecified 03/18/2021   PFIZER(Purple Top)SARS-COV-2 Vaccination 08/28/2019, 09/18/2019, 04/24/2020, 04/24/2020, 04/03/2021   Pneumococcal Conjugate-13 02/07/2013   Pneumococcal Polysaccharide-23 05/01/2014   Tdap 05/22/2018    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   OVERACTIVE BLADDER 08/03/2010   PYELONEPHRITIS, HX OF 05/07/2007   SYSTOLIC HEART FAILURE, CHRONIC 12/28/2008   VITAMIN D DEFICIENCY 06/17/2008    Tobacco History: Social History   Tobacco Use  Smoking Status Never  Smokeless Tobacco Never   Counseling given: Not Answered   Continue to not smoke  Outpatient Encounter Medications as of 09/12/2023  Medication Sig   albuterol (PROVENTIL HFA;VENTOLIN HFA) 108 (90 Base) MCG/ACT inhaler Inhale 1 puff into the lungs every 6 (six) hours as needed for wheezing or shortness of breath.   amoxicillin-clavulanate (AUGMENTIN) 875-125 MG tablet Take 1 tablet by mouth 2 (two) times daily for 10 days.   benzonatate (TESSALON PERLES) 100 MG capsule 1-2 tab by mouth evry 8 hr as needed   carvedilol (COREG) 25 MG tablet TAKE ONE-HALF TABLET BY MOUTH  TWICE DAILY   cholecalciferol (VITAMIN D3) 25 MCG (1000 UT) tablet Take 1,000  Units by mouth daily.   dapagliflozin propanediol (FARXIGA) 10 MG TABS tablet TAKE 1 TABLET BY MOUTH EVERY DAY BEFORE BREAKFAST   furosemide (LASIX) 40 MG tablet TAKE 1 TABLET BY MOUTH TWICE  DAILY   lidocaine-prilocaine (EMLA) cream APPLY TO AFFECTED AREA ONCE AS DIRECTED   meloxicam (MOBIC) 15 MG tablet Take 1 tablet (15 mg total) by mouth daily as needed for pain.   Multiple Vitamin (MULTIVITAMIN WITH MINERALS) TABS tablet Take 1 tablet by mouth daily.   nitroGLYCERIN (NITROLINGUAL) 0.4 MG/SPRAY spray  Place 1 spray under the tongue every 5 (five) minutes x 3 doses as needed for chest pain.   rizatriptan (MAXALT) 10 MG tablet Take 10 mg by mouth as needed for migraine. May repeat in 2 hours if needed   rosuvastatin (CRESTOR) 20 MG tablet Take 1 tablet (20 mg total) by mouth daily.   sacubitril-valsartan (ENTRESTO) 24-26 MG TAKE 1 TABLET BY MOUTH TWICE A DAY   scopolamine (TRANSDERM-SCOP, 1.5 MG,) 1 MG/3DAYS Place 1 patch (1.5 mg total) onto the skin every 3 (three) days.   spironolactone (ALDACTONE) 25 MG tablet Take 0.5 tablets (12.5 mg total) by mouth daily.   tiZANidine (ZANAFLEX) 2 MG tablet TAKE 2 TABLETS BY MOUTH EVERY 6 HOURS AS NEEDED FOR MUSCLE SPASM   traMADol (ULTRAM) 50 MG tablet Take 1 tablet (50 mg total) by mouth every 6 (six) hours as needed.   [DISCONTINUED] cefdinir (OMNICEF) 300 MG capsule Take 1 capsule (300 mg total) by mouth 2 (two) times daily.   [DISCONTINUED] Tiotropium Bromide-Olodaterol (STIOLTO RESPIMAT) 2.5-2.5 MCG/ACT AERS INHALE 2 PUFFS BY MOUTH INTO THE LUNGS DAILY   Tiotropium Bromide-Olodaterol (STIOLTO RESPIMAT) 2.5-2.5 MCG/ACT AERS Inhale 2 puffs into the lungs daily.   [DISCONTINUED] Tiotropium Bromide-Olodaterol (STIOLTO RESPIMAT) 2.5-2.5 MCG/ACT AERS Inhale 2 puffs into the lungs 2 (two) times daily.   No facility-administered encounter medications on file as of 09/12/2023.     Review of Systems  Review of Systems  N/a Physical Exam  BP 109/63    Pulse 63   Ht 5\' 7"  (1.702 m)   Wt 227 lb 6.4 oz (103.1 kg)   SpO2 98%   BMI 35.62 kg/m   Wt Readings from Last 5 Encounters:  09/12/23 227 lb 6.4 oz (103.1 kg)  08/10/23 217 lb (98.4 kg)  05/18/23 222 lb 3.2 oz (100.8 kg)  03/30/23 216 lb (98 kg)  02/07/23 216 lb (98 kg)    BMI Readings from Last 5 Encounters:  09/12/23 35.62 kg/m  08/10/23 33.99 kg/m  05/18/23 34.80 kg/m  03/30/23 33.83 kg/m  02/07/23 33.83 kg/m     Physical Exam General: Well-appearing, no acute distress Eyes: EOMI, no icterus Ears: Right tympanic membrane clear with clear normal-appearing fluid, left tympanic membrane with mild effusion present, no erythema Neck: Supple, no JVP Pulmonary: Clear, normal work of breathing Cardiovascular: Regular rhythm, no murmur Abdomen: Nondistended, bowel sounds present MSK: No synovitis, no joint effusion Neuro: Normal gait, no weakness Psych: Normal mood, full affect   Assessment & Plan:   Dyspnea on exertion: Likely multifactorial and related to deconditioning, chronotropic/inotropic insufficiency in the setting of severe cardiomyopathy EF 20%, possibly uncontrolled asthma.  PFT with CPET 09/2020 with normal spirometry.  Albuterol helps some.  Marked improvement with Stiolto.  To continue.  Likely asthma: Prescribed ICS/LABA versus Advair in the past and did not like taste, did not like rinsing mouth.  Albuterol helps some.   Avoid ICS therapy given her aversion to rinsing out mouth after every use. DOE much better with [employer].  Rare rescue inhaler use.  Lung nodule: Tiny, less than 6 mm seen in 2021.  She does have a history of breast cancer so consider her high risk.  Repeat CT 08/2021 without concern. No further imaging needed.  Eustachian tube dysfunction: Primary on the left.  Suspect allergic, seasonal, but given fluid left ear, Augmentin twice daily for 10 days for possible ear infection prescribed.  Continue intranasal regimen azelastine and flonase.   Seen by ENT for similar issue, they  were unhelpful not interested.  Referral to new ENT doctor today.   Return in about 1 year (around 09/11/2024) for f/u Dr. Judeth Horn.   Karren Burly, MD 09/12/2023

## 2023-09-20 DIAGNOSIS — Z961 Presence of intraocular lens: Secondary | ICD-10-CM | POA: Diagnosis not present

## 2023-09-20 DIAGNOSIS — H43813 Vitreous degeneration, bilateral: Secondary | ICD-10-CM | POA: Diagnosis not present

## 2023-10-03 ENCOUNTER — Encounter (INDEPENDENT_AMBULATORY_CARE_PROVIDER_SITE_OTHER): Payer: Self-pay

## 2023-10-09 DIAGNOSIS — Z1231 Encounter for screening mammogram for malignant neoplasm of breast: Secondary | ICD-10-CM | POA: Diagnosis not present

## 2023-10-09 LAB — HM MAMMOGRAPHY

## 2023-10-10 ENCOUNTER — Encounter: Payer: Self-pay | Admitting: Internal Medicine

## 2023-10-30 ENCOUNTER — Other Ambulatory Visit: Payer: Self-pay | Admitting: Internal Medicine

## 2023-10-30 ENCOUNTER — Other Ambulatory Visit: Payer: Self-pay

## 2023-10-31 ENCOUNTER — Ambulatory Visit: Payer: Medicare Other

## 2023-10-31 DIAGNOSIS — I428 Other cardiomyopathies: Secondary | ICD-10-CM | POA: Diagnosis not present

## 2023-10-31 LAB — CUP PACEART REMOTE DEVICE CHECK
Battery Remaining Longevity: 61 mo
Battery Voltage: 2.99 V
Brady Statistic RV Percent Paced: 0.01 %
Date Time Interrogation Session: 20250422012406
HighPow Impedance: 43 Ohm
HighPow Impedance: 58 Ohm
Implantable Lead Connection Status: 753985
Implantable Lead Implant Date: 20081215
Implantable Lead Location: 753860
Implantable Lead Model: 6947
Implantable Pulse Generator Implant Date: 20190418
Lead Channel Impedance Value: 342 Ohm
Lead Channel Impedance Value: 456 Ohm
Lead Channel Pacing Threshold Amplitude: 0.875 V
Lead Channel Pacing Threshold Pulse Width: 0.4 ms
Lead Channel Sensing Intrinsic Amplitude: 16.375 mV
Lead Channel Sensing Intrinsic Amplitude: 16.375 mV
Lead Channel Setting Pacing Amplitude: 2.5 V
Lead Channel Setting Pacing Pulse Width: 0.4 ms
Lead Channel Setting Sensing Sensitivity: 0.3 mV
Zone Setting Status: 755011
Zone Setting Status: 755011

## 2023-11-02 ENCOUNTER — Other Ambulatory Visit: Payer: Self-pay | Admitting: Internal Medicine

## 2023-11-10 ENCOUNTER — Telehealth: Payer: Self-pay | Admitting: Internal Medicine

## 2023-11-10 NOTE — Telephone Encounter (Unsigned)
 Copied from CRM [id number]. Topic: Clinical - Medical Advice >> Nov 10, 2023  9:39 AM Clyde Darling P wrote: Reason for CRM: Pt would like a callback to discuss a medical concern.

## 2023-11-14 ENCOUNTER — Other Ambulatory Visit: Payer: Self-pay | Admitting: Internal Medicine

## 2023-11-21 ENCOUNTER — Ambulatory Visit
Admission: EM | Admit: 2023-11-21 | Discharge: 2023-11-21 | Disposition: A | Discharge status: Home / Self Care | Attending: Family Medicine | Admitting: Family Medicine

## 2023-11-21 ENCOUNTER — Encounter: Payer: Self-pay | Admitting: Emergency Medicine

## 2023-11-21 ENCOUNTER — Other Ambulatory Visit: Payer: Self-pay

## 2023-11-21 ENCOUNTER — Ambulatory Visit: Payer: Self-pay

## 2023-11-21 DIAGNOSIS — R21 Rash and other nonspecific skin eruption: Secondary | ICD-10-CM

## 2023-11-21 MED ORDER — PREDNISONE 20 MG PO TABS
40.0000 mg | ORAL_TABLET | Freq: Every day | ORAL | 0 refills | Status: AC
Start: 2023-11-21 — End: 2023-11-26

## 2023-11-21 MED ORDER — CLOBETASOL PROP EMOLLIENT BASE 0.05 % EX CREA: TOPICAL_CREAM | CUTANEOUS | 0 refills | Status: DC

## 2023-11-21 MED ORDER — TRIAMCINOLONE ACETONIDE 0.1 % EX CREA: 1.0000 | TOPICAL_CREAM | Freq: Two times a day (BID) | CUTANEOUS | 0 refills | Status: DC

## 2023-11-21 NOTE — Telephone Encounter (Signed)
 Copied from CRM [id number]. Topic: Clinical - Red Word Triage >> Nov 21, 2023  9:54 AM [name] [name] wrote: Red Word that prompted transfer to Nurse Triage: Swelling around neck. Started as itch in leg but worked its way up to groin, then abdomen and chest/neck area. Severe itching and redness.  Chief Complaint: Itching Symptoms: Severe itching on neck and chin, scratch marks Frequency: A month Pertinent Negatives: Patient denies difficulty breathing Disposition: [] ED /[x] Urgent Care (no appt availability in office) / [] Appointment(In office/virtual)/ []  [facility]/ [] Home Care/ [] Refused Recommended Disposition /[] [facility]/ []  Follow-up with PCP Additional Notes: Patient called in to report widespread itching. Patient stated the itching has been ongoing for about a month. Patient stated the itching started on her leg, moved to her groin and is now on her neck and chin. Patient stated itching is severe. Patient denied visibile rash or hives. Patient denied difficulty breathing and tongue/airway swelling. Advised patient to be seen within 24 hours, per protocol. Patient declined an appointment in the office tomorrow because it is her birthday and an appointment later in the week with an alternate provider. Advised UC at this time. Patient stated she would try her best to go. Provided care advice and instructed patient to call back if symptoms worsen. Patient complied. Patient is requesting someone from the office to follow-up with her because she would like to be seen by her PCP only if she cannot make it to UC today. Advised that I would route request to office. Please follow-up with patient.   Reason for Disposition  [1] MODERATE-SEVERE widespread itching (i.e., interferes with sleep, normal activities or school) AND [2] not improved after 24 hours of itching Care Advice  Answer Assessment - Initial Assessment Questions 1. DESCRIPTION: "Describe the itching you are having."      "Itching from within" 2. SEVERITY: "How bad is it?"    - MILD: Doesn't interfere with normal activities.   - MODERATE-SEVERE: Interferes with work, school, sleep, or other activities.      Rates itching 12/10 3. SCRATCHING: "Are there any scratch marks? Bleeding?"     States she doesn't have a choice, but to scratch, states she is "cutting herself up" 4. ONSET: "When did this begin?"      A month 5. CAUSE: "What do you think is causing the itching?" (ask about swimming pools, pollen, animals, soaps, etc.)     Unknown, states she was treated for this in February, but it has come back and is worse this time 6. OTHER SYMPTOMS: "Do you have any other symptoms?"      Denies rash/hives Using hydrogen peroxide and alcohol, states she was instructed by a "hotline nurse" not to try Benadryl Denies difficulty breathing, denies swelling of airway/tongue Started on leg, moved to groin, now moved to neck and chin  Protocols used: Itching - [facility]

## 2023-11-21 NOTE — Discharge Instructions (Addendum)
 You were seen today for a rash.  I have sent out a topical cream, as well as oral steroids to use for the rash.  You should take over the counter claritin/benadryl as well.  If you continue with rash please return or follow up with your primary care provider.

## 2023-11-21 NOTE — Telephone Encounter (Signed)
 Called and spoke with Pt and she states she was able to go to UC today.

## 2023-11-21 NOTE — ED Provider Notes (Signed)
 [facility]    CSN: [id number] Arrival date & time: 11/21/23  1434      History   Chief Complaint Chief Complaint  Patient presents with   Rash    HPI [name] [name] is a 83 y.o. female.    Rash  Patient is here for rash.  Rash has been in different parts of the body off/on x 2 months, but has been to the neck x 3 weeks.  It is very itchy.  Rash is a bit of swelling.  No other rashes today other than the neck.  No changes to soaps, detergents/lotions.  No new medications.         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   OVERACTIVE BLADDER 08/03/2010   PYELONEPHRITIS, HX OF 05/07/2007   SYSTOLIC HEART FAILURE, CHRONIC 12/28/2008   VITAMIN D  DEFICIENCY 06/17/2008    Patient Active Problem List   Diagnosis Date Noted   Left otitis media with effusion 08/10/2023   Abscess of groin, left 02/10/2023   Chronic low back pain 06/07/2022   Acute cough 12/04/2021   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   Dysphagia 05/31/2021   Rash 05/31/2021   Right knee pain 10/01/2020   Pulmonary nodule 05/30/2020   Boil 10/29/2019   B12 deficiency 10/29/2019   Hyperglycemia 10/29/2019   CKD (chronic kidney disease) stage 3, GFR 30-59 ml/min (HCC) 05/31/2019   Port-A-Cath in place 11/15/2018   Malignant neoplasm of lower-outer quadrant of right breast of female, estrogen receptor negative (HCC) 09/03/2018   Wheezing 10/03/2017   Left lumbar radiculopathy 05/16/2017   Rotator cuff  tear, right 03/23/2017   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/07/2007   DEGENERATIVE DISC DISEASE, 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   PYELONEPHRITIS, HX OF 05/07/2007   HYSTERECTOMY, HX OF 05/07/2007   LAMINECTOMY, HX OF 05/07/2007    Past Surgical History:  Procedure Laterality Date   ABDOMINAL HYSTERECTOMY     BREAST LUMPECTOMY WITH RADIOACTIVE SEED AND SENTINEL LYMPH NODE BIOPSY Right 10/11/2018   Procedure: RIGHT BREAST LUMPECTOMY WITH RADIOACTIVE SEED AND SENTINEL LYMPH NODE BIOPSY;  Surgeon: Lockie Rima, MD;  Location: [facility] OR;  Service: General;  Laterality: Right;   ICD GENERATOR CHANGEOUT N/A 10/25/2017   Procedure: ICD GENERATOR CHANGEOUT;  Surgeon: Verona Goodwill, MD;  Location: [facility] INVASIVE CV LAB;  Service: Cardiovascular;  Laterality: N/A;   LAMINECTOMY     PORT-A-CATH REMOVAL N/A 05/08/2019   Procedure: REMOVAL OF PORT-A-CATH RIGHT CHEST;  Surgeon: Lockie Rima, MD;  Location: [facility] OR;  Service: General;  Laterality: N/A;   PORTACATH PLACEMENT N/A 10/11/2018   Procedure: INSERTION PORT-A-CATH;   Surgeon: Lockie Rima, MD;  Location: [facility] OR;  Service: General;  Laterality: N/A;   RE-EXCISION OF BREAST LUMPECTOMY Right 10/23/2018   Procedure: RE-EXCISION OF RIGHT BREAST LUMPECTOMY;  Surgeon: Lockie Rima, MD;  Location: [facility] OR;  Service: General;  Laterality: Right;    OB History     Gravida  1   Para  0   Term  0   Preterm  0   AB  0   Living         SAB  0   IAB  0   Ectopic  0   Multiple      Live Births               Home Medications    Prior to Admission medications   Medication Sig Start Date End Date Taking? Authorizing Provider  albuterol  (PROVENTIL  HFA;VENTOLIN  HFA) 108 (90 Base) MCG/ACT inhaler Inhale 1 puff into the lungs every 6 (six) hours as needed for wheezing or shortness of breath.    [provider]  benzonatate  (TESSALON  PERLES) 100 MG capsule 1-2 tab by mouth evry 8 hr as needed 08/02/23   Roslyn Coombe, MD  carvedilol  (COREG ) 25 MG tablet TAKE ONE-HALF TABLET BY MOUTH  TWICE DAILY 03/24/23   Lenise Quince, MD  cholecalciferol (VITAMIN D3) 25 MCG (1000 UT) tablet Take 1,000 Units by mouth daily.    [provider]  dapagliflozin  propanediol (FARXIGA ) 10 MG TABS tablet TAKE 1 TABLET BY MOUTH EVERY DAY BEFORE BREAKFAST 08/14/23   Lenise Quince, MD  furosemide  (LASIX ) 40 MG tablet TAKE 1 TABLET BY MOUTH TWICE  DAILY 03/24/23   Lenise Quince, MD  lidocaine -prilocaine  (EMLA ) cream APPLY TO AFFECTED AREA ONCE AS DIRECTED 08/17/23   Roslyn Coombe, MD  meloxicam  (MOBIC ) 15 MG tablet Take 1 tablet (15 mg total) by mouth daily as needed for pain. 02/07/23   Roslyn Coombe, MD  Multiple Vitamin (MULTIVITAMIN WITH MINERALS) TABS tablet Take 1 tablet by mouth daily.    [provider]  nitroGLYCERIN  (NITROLINGUAL ) 0.4 MG/SPRAY spray Place 1 spray under the tongue every 5 (five) minutes x 3 doses as needed for chest pain. 02/04/22   Lenise Quince, MD  rizatriptan  (MAXALT ) 10 MG tablet Take 10 mg by mouth as needed for  migraine. May repeat in 2 hours if needed    [provider]  rosuvastatin  (CRESTOR ) 20 MG tablet Take 1 tablet (20 mg total) by mouth daily. 02/07/23   Roslyn Coombe, MD  sacubitril -valsartan  (ENTRESTO ) 24-26 MG TAKE 1 TABLET BY MOUTH TWICE A DAY 07/03/23   Lenise Quince, MD  scopolamine  (TRANSDERM-SCOP, 1.5 MG,) 1 MG/3DAYS Place 1 patch (1.5 mg total) onto the skin every 3 (three) days. 11/02/20   Roslyn Coombe, MD  spironolactone  (ALDACTONE ) 25 MG tablet Take 0.5 tablets (12.5 mg total) by mouth daily. 02/07/23   Roslyn Coombe, MD  Tiotropium Bromide-Olodaterol (STIOLTO RESPIMAT ) 2.5-2.5 MCG/ACT AERS Inhale 2 puffs into the lungs daily. 09/12/23   Hunsucker, Archer Kobs, MD  tiZANidine  (ZANAFLEX ) 2 MG tablet TAKE 2 TABLETS BY  MOUTH EVERY 6 HOURS AS NEEDED FOR MUSCLE SPASM 10/30/23   Roslyn Coombe, MD  traMADol  (ULTRAM ) 50 MG tablet Take 1 tablet (50 mg total) by mouth every 6 (six) hours as needed. 02/07/23   Roslyn Coombe, MD  triamcinolone  cream (KENALOG ) 0.1 % APPLY TO AFFECTED AREA TWICE A DAY 11/02/23   Roslyn Coombe, MD    Family History Family History  Problem Relation Age of Onset   Arthritis Other    Coronary artery disease Other    Kidney disease Other    Heart disease Mother     Social History Social History   Tobacco Use   Smoking status: Never   Smokeless tobacco: Never  Substance Use Topics   Alcohol use: No   Drug use: No     Allergies   Patient has no active allergies.   Review of Systems Review of Systems  Constitutional: Negative.   HENT: Negative.    Respiratory: Negative.    Cardiovascular: Negative.   Gastrointestinal: Negative.   Skin:  Positive for rash.     Physical Exam Triage Vital Signs ED Triage Vitals [11/21/23 1523]  Encounter Vitals Group     BP 119/74     Systolic BP Percentile      Diastolic BP Percentile      Pulse Rate 88     Resp 18     Temp 98.1 F (36.7 C)     Temp Source Oral     SpO2 96 %     Weight      Height       Head Circumference      Peak Flow      Pain Score 0     Pain Loc      Pain Education      Exclude from Growth Chart    No data found.  Updated Vital Signs BP 119/74 (BP Location: Left Arm)   Pulse 88   Temp 98.1 F (36.7 C) (Oral)   Resp 18   SpO2 96%   Visual Acuity Right Eye Distance:   Left Eye Distance:   Bilateral Distance:    Right Eye Near:   Left Eye Near:    Bilateral Near:     Physical Exam Constitutional:      Appearance: Normal appearance. She is normal weight.  Cardiovascular:     Rate and Rhythm: Normal rate and regular rhythm.  Pulmonary:     Effort: Pulmonary effort is normal.     Breath sounds: Normal breath sounds.  Skin:    Comments: She has a raised, tender rash to the posterior neck, and to the right lateral neck and under the right jaw;  no other rashes noted at this time.   Neurological:     General: No focal deficit present.     Mental Status: She is alert.  Psychiatric:        Mood and Affect: Mood normal.      UC Treatments / Results  Labs (all labs ordered are listed, but only abnormal results are displayed) Labs Reviewed - No data to display  EKG   Radiology No results found.  Procedures Procedures (including critical care time)  Medications Ordered in UC Medications - No data to display  Initial Impression / Assessment and Plan / UC Course  I have reviewed the triage vital signs and the nursing notes.  Pertinent labs & imaging results that were available during my care of the patient were reviewed  by me and considered in my medical decision making (see chart for details).  Final Clinical Impressions(s) / UC Diagnoses   Final diagnoses:  Rash and nonspecific skin eruption     Discharge Instructions      You were seen today for a rash.  I have sent out a topical cream, as well as oral steroids to use for the rash.  You should take over the counter claritin/benadryl as well.  If you continue with rash  please return or follow up with your primary care provider.   ED Prescriptions     Medication Sig Dispense Auth. Provider   triamcinolone  cream (KENALOG ) 0.1 % Apply 1 Application topically 2 (two) times daily. 30 g Malakai Schoenherr, Cleveland Dales, MD   predniSONE  (DELTASONE ) 20 MG tablet Take 2 tablets (40 mg total) by mouth daily for 5 days. 10 tablet Lesle Ras, MD      PDMP not reviewed this encounter.   Lesle Ras, MD 11/21/23 1537

## 2023-11-21 NOTE — ED Triage Notes (Signed)
 Pt sts rash in different areas of body a x 2 months but on neck x 3 weeks with itching

## 2023-11-27 ENCOUNTER — Other Ambulatory Visit: Payer: Self-pay | Admitting: Cardiology

## 2023-11-27 DIAGNOSIS — I428 Other cardiomyopathies: Secondary | ICD-10-CM

## 2023-12-01 ENCOUNTER — Other Ambulatory Visit: Payer: Self-pay | Admitting: Cardiology

## 2023-12-06 ENCOUNTER — Ambulatory Visit: Admitting: Internal Medicine

## 2023-12-11 ENCOUNTER — Ambulatory Visit (INDEPENDENT_AMBULATORY_CARE_PROVIDER_SITE_OTHER): Admitting: Internal Medicine

## 2023-12-11 ENCOUNTER — Encounter: Payer: Self-pay | Admitting: Internal Medicine

## 2023-12-11 VITALS — BP 124/78 | HR 76 | Temp 97.7°F | Ht 67.0 in | Wt 218.0 lb

## 2023-12-11 DIAGNOSIS — E538 Deficiency of other specified B group vitamins: Secondary | ICD-10-CM | POA: Diagnosis not present

## 2023-12-11 DIAGNOSIS — E559 Vitamin D deficiency, unspecified: Secondary | ICD-10-CM | POA: Diagnosis not present

## 2023-12-11 DIAGNOSIS — R21 Rash and other nonspecific skin eruption: Secondary | ICD-10-CM

## 2023-12-11 DIAGNOSIS — N1831 Chronic kidney disease, stage 3a: Secondary | ICD-10-CM

## 2023-12-11 DIAGNOSIS — E7800 Pure hypercholesterolemia, unspecified: Secondary | ICD-10-CM | POA: Diagnosis not present

## 2023-12-11 DIAGNOSIS — I10 Essential (primary) hypertension: Secondary | ICD-10-CM | POA: Diagnosis not present

## 2023-12-11 DIAGNOSIS — R739 Hyperglycemia, unspecified: Secondary | ICD-10-CM | POA: Diagnosis not present

## 2023-12-11 LAB — URINALYSIS, ROUTINE W REFLEX MICROSCOPIC
Leukocytes,Ua: NEGATIVE
Nitrite: NEGATIVE
Specific Gravity, Urine: >=1.03 — AB (ref 1.000–1.030)
Total Protein, Urine: 100 — AB
Urine Glucose: NEGATIVE
Urobilinogen, UA: 0.2 (ref 0.0–1.0)
pH: 5.5 (ref 5.0–8.0)

## 2023-12-11 MED ORDER — TRIAMCINOLONE ACETONIDE 0.1 % EX CREA: 1.0000 | TOPICAL_CREAM | Freq: Two times a day (BID) | CUTANEOUS | 1 refills | Status: DC

## 2023-12-11 NOTE — Patient Instructions (Signed)
 Please take all new medication as prescribed  - the topical steroid cream  Please continue all other medications as before, and refills have been done if requested.  Please have the pharmacy call with any other refills you may need.  Please continue your efforts at being more active, low cholesterol diet, and weight control.  You are otherwise up to date with prevention measures today.  Please keep your appointments with your specialists as you may have planned  Please go to the LAB at the blood drawing area for the tests to be done  You will be contacted by phone if any changes need to be made immediately.  Otherwise, you will receive a letter about your results with an explanation, but please check with MyChart first.  Please make an Appointment to return in 6 months, or sooner if needed

## 2023-12-11 NOTE — Assessment & Plan Note (Signed)
 C/w eczematous it seemds, for triam cr prn asd

## 2023-12-11 NOTE — Assessment & Plan Note (Signed)
Lab Results  Component Value Date   CREATININE 1.63 (H) 02/07/2023   Stable overall, cont to avoid nephrotoxins

## 2023-12-11 NOTE — Progress Notes (Signed)
 Patient ID: [name] [name], female   DOB: [birth year], 83 y.o.   MRN: [id number]        Chief Complaint: follow up rash, low vit d, htn       HPI:  [name] [name] is a 83 y.o. female here with c/o recent rash to right thigh, right upper chest to the neck and chin, as well as distal LLE with itching and excoriations due to severity and scratching, Saw uc may 13 with prednisone  taper and cream, and improved but trying to come back.  Pt denies chest pain, increased sob or doe, wheezing, orthopnea, PND, increased LE swelling, palpitations, dizziness or syncope.   Pt denies polydipsia, polyuria, or new focal neuro s/s.          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   OVERACTIVE BLADDER 08/03/2010   PYELONEPHRITIS, HX OF 05/07/2007   SYSTOLIC HEART FAILURE, CHRONIC 12/28/2008   VITAMIN D  DEFICIENCY 06/17/2008   Past Surgical History:  Procedure Laterality Date   ABDOMINAL HYSTERECTOMY     BREAST LUMPECTOMY WITH RADIOACTIVE SEED AND SENTINEL LYMPH NODE BIOPSY Right 10/11/2018   Procedure: RIGHT BREAST LUMPECTOMY WITH RADIOACTIVE SEED AND SENTINEL LYMPH NODE BIOPSY;  Surgeon: Lockie Rima, MD;  Location: [facility] OR;  Service: General;  Laterality: Right;   ICD GENERATOR CHANGEOUT N/A 10/25/2017   Procedure:  ICD GENERATOR CHANGEOUT;  Surgeon: Verona Goodwill, MD;  Location: [facility] INVASIVE CV LAB;  Service: Cardiovascular;  Laterality: N/A;   LAMINECTOMY     PORT-A-CATH REMOVAL N/A 05/08/2019   Procedure: REMOVAL OF PORT-A-CATH RIGHT CHEST;  Surgeon: Lockie Rima, MD;  Location: [facility] OR;  Service: General;  Laterality: N/A;   PORTACATH PLACEMENT N/A 10/11/2018   Procedure: INSERTION PORT-A-CATH;  Surgeon: Lockie Rima, MD;  Location: [facility];  Service: General;  Laterality: N/A;   RE-EXCISION OF BREAST LUMPECTOMY Right 10/23/2018   Procedure: RE-EXCISION OF RIGHT BREAST LUMPECTOMY;  Surgeon: Lockie Rima, MD;  Location: [facility];  Service: General;  Laterality: Right;    reports that she has never smoked. She has never used smokeless tobacco. She reports that she does not drink alcohol and does not use drugs. family history includes Arthritis in an other family member; Coronary artery disease in an other family member; Heart disease in her mother; Kidney disease in an other family member. No Active Allergies Current Outpatient Medications on File Prior to Visit  Medication Sig Dispense Refill   albuterol  (PROVENTIL  HFA;VENTOLIN  HFA) 108 (90 Base) MCG/ACT inhaler Inhale 1 puff into the lungs every 6 (six) hours as needed for wheezing or shortness of breath.     carvedilol  (COREG ) 25 MG tablet TAKE ONE-HALF TABLET BY MOUTH  TWICE DAILY 90 tablet 0   cholecalciferol (VITAMIN D3) 25 MCG (  1000 UT) tablet Take 1,000 Units by mouth daily.     dapagliflozin  propanediol (FARXIGA ) 10 MG TABS tablet TAKE 1 TABLET BY MOUTH EVERY DAY BEFORE BREAKFAST 90 tablet 1   furosemide  (LASIX ) 40 MG tablet TAKE 1 TABLET BY MOUTH TWICE  DAILY 180 tablet 0   lidocaine -prilocaine  (EMLA ) cream APPLY TO AFFECTED AREA ONCE AS DIRECTED 30 g 3   meloxicam  (MOBIC ) 15 MG tablet Take 1 tablet (15 mg total) by mouth daily as needed for pain. 90 tablet 3   Multiple Vitamin (MULTIVITAMIN WITH MINERALS) TABS tablet Take 1 tablet by mouth daily.      nitroGLYCERIN  (NITROLINGUAL ) 0.4 MG/SPRAY spray Place 1 spray under the tongue every 5 (five) minutes x 3 doses as needed for chest pain. 12 g 3   rizatriptan  (MAXALT ) 10 MG tablet Take 10 mg by mouth as needed for migraine. May repeat in 2 hours if needed     rosuvastatin  (CRESTOR ) 20 MG tablet Take 1 tablet (20 mg total) by mouth daily. 90 tablet 3   sacubitril -valsartan  (ENTRESTO ) 24-26 MG TAKE 1 TABLET BY MOUTH TWICE A DAY 180 tablet 0   scopolamine  (TRANSDERM-SCOP, 1.5 MG,) 1 MG/3DAYS Place 1 patch (1.5 mg total) onto the skin every 3 (three) days. 10 patch 0   spironolactone  (ALDACTONE ) 25 MG tablet Take 0.5 tablets (12.5 mg total) by mouth daily. 50 tablet 3   Tiotropium Bromide-Olodaterol (STIOLTO RESPIMAT ) 2.5-2.5 MCG/ACT AERS Inhale 2 puffs into the lungs daily. 4 g 12   tiZANidine  (ZANAFLEX ) 2 MG tablet TAKE 2 TABLETS BY MOUTH EVERY 6 HOURS AS NEEDED FOR MUSCLE SPASM 60 tablet 2   traMADol  (ULTRAM ) 50 MG tablet Take 1 tablet (50 mg total) by mouth every 6 (six) hours as needed. 120 tablet 2   No current facility-administered medications on file prior to visit.        ROS:  All others reviewed and negative.  Objective        PE:  BP 124/78 (BP Location: Left Arm, Patient Position: Sitting, Cuff Size: Normal)   Pulse 76   Temp 97.7 F (36.5 C) (Oral)   Ht 5\' 7"  (1.702 m)   Wt 218 lb (98.9 kg)   SpO2 94%   BMI 34.14 kg/m                 Constitutional: Pt appears in NAD               HENT: Head: NCAT.                Right Ear: External ear normal.                 Left Ear: External ear normal.                Eyes: . Pupils are equal, round, and reactive to light. Conjunctivae and EOM are normal               Nose: without d/c or deformity               Neck: Neck supple. Gross normal ROM               Cardiovascular: Normal rate and regular rhythm.                 Pulmonary/Chest: Effort normal and breath sounds without rales or wheezing.                Abd:  Soft, NT,  ND, + BS, no organomegaly               Neurological: Pt is alert. At baseline orientation, motor grossly intact               Skin: Skin is warm., LE edema - none, eczematous rash to right upper chest, right thigh and distal LLE pretibial               Psychiatric: Pt behavior is normal without agitation   Micro: none  Cardiac tracings I have personally interpreted today:  none  Pertinent Radiological findings (summarize): none   Lab Results  Component Value Date   WBC 9.6 02/07/2023   HGB 11.7 (L) 02/07/2023   HCT 37.6 02/07/2023   PLT 172.0 02/07/2023   GLUCOSE 102 (H) 02/07/2023   CHOL 112 02/07/2023   TRIG 80.0 02/07/2023   HDL 48.20 02/07/2023   LDLDIRECT 129.7 02/07/2013   LDLCALC 47 02/07/2023   ALT 9 02/07/2023   AST 12 02/07/2023   NA 136 02/07/2023   K 4.9 02/07/2023   CL 101 02/07/2023   CREATININE 1.63 (H) 02/07/2023   BUN 20 02/07/2023   CO2 27 02/07/2023   TSH 0.81 02/07/2023   INR 0.9 RATIO 06/18/2007   HGBA1C 6.2 02/07/2023   MICROALBUR 1.1 02/07/2023   Assessment/Plan:  [name] [name] is a 83 y.o. Black or African American [2] female with  has a past medical history of AICD (automatic cardioverter/defibrillator) present, ALLERGIC RHINITIS (11/06/2007), ANEMIA-IRON DEFICIENCY (05/23/2007), ANEMIA-NOS (06/17/2008), ANXIETY (02/22/2008), CARDIOMYOPATHY, PRIMARY, DILATED (12/30/2008), DEGENERATIVE DISC DISEASE, LUMBAR SPINE (05/07/2007), GERD (05/07/2007), History of kidney stones, HYPERLIPIDEMIA (05/23/2007), HYPERTENSION (05/07/2007), KELOID SCAR, HX OF (02/02/2009), MENOPAUSAL DISORDER (05/06/2008), OSTEOARTHRITIS, KNEES, BILATERAL (05/23/2007), OVERACTIVE BLADDER (08/03/2010), PYELONEPHRITIS, HX OF (05/07/2007), SYSTOLIC HEART FAILURE, CHRONIC (12/28/2008), and VITAMIN D  DEFICIENCY (06/17/2008).  Vitamin D  deficiency Last vitamin D  Lab Results  Component Value Date   VD25OH 42.22 02/07/2023   Stable, cont oral replacement   Essential hypertension BP  Readings from Last 3 Encounters:  12/11/23 124/78  11/21/23 119/74  09/12/23 109/63   Stable, pt to continue medical treatment coreg  12.5 bid   CKD (chronic kidney disease) stage 3, GFR 30-59 ml/min Lab Results  Component Value Date   CREATININE 1.63 (H) 02/07/2023   Stable overall, cont to avoid nephrotoxins   B12 deficiency Lab Results  Component Value Date   VITAMINB12 185 (L) 02/07/2023   Low to start oral replacement - b12 1000 mcg qd   Hyperglycemia Lab Results  Component Value Date   HGBA1C 6.2 02/07/2023   Stable, pt to continue current medical treatment farxiga  10 mg qd   Rash C/w eczematous it seemds, for triam cr prn asd  Followup: Return in about 6 months (around 06/11/2024).  Rosalia Colonel, MD 12/11/2023 8:44 PM [facility]

## 2023-12-11 NOTE — Assessment & Plan Note (Signed)
 Last vitamin D Lab Results  Component Value Date   VD25OH 42.22 02/07/2023   Stable, cont oral replacement

## 2023-12-11 NOTE — Assessment & Plan Note (Signed)
 Lab Results  Component Value Date   VITAMINB12 185 (L) 02/07/2023   Low to start oral replacement - b12 1000 mcg qd

## 2023-12-11 NOTE — Assessment & Plan Note (Signed)
 BP Readings from Last 3 Encounters:  12/11/23 124/78  11/21/23 119/74  09/12/23 109/63   Stable, pt to continue medical treatment coreg  12.5 bid

## 2023-12-11 NOTE — Assessment & Plan Note (Signed)
 Lab Results  Component Value Date   HGBA1C 6.2 02/07/2023   Stable, pt to continue current medical treatment farxiga  10 mg qd

## 2023-12-12 ENCOUNTER — Ambulatory Visit: Payer: Self-pay

## 2023-12-12 NOTE — Telephone Encounter (Signed)
 3rd attempt made to contact patient. Will go ahead and route to office for f/u   Copied from CRM [id number]. Topic: Clinical - Medication Question >> Dec 12, 2023 12:17 PM Kita Perish H wrote: Reason for CRM: Patient was seen yesterday for rash and provider gave triamcinolone  cream (KENALOG ) 0.1 %, and patient states she needs something stronger. Not helping with the itching, and starting to welt up from her scratching.  Adriana Hopping [phone]

## 2023-12-12 NOTE — Telephone Encounter (Signed)
 Copied from CRM [id number]. Topic: Clinical - Medication Question >> Dec 12, 2023 12:17 PM Kita Perish H wrote: Reason for CRM: Patient was seen yesterday for rash and provider gave triamcinolone  cream (KENALOG ) 0.1 %, and patient states she needs something stronger. Not helping with the itching, and starting to welt up from her scratching.  Adriana Hopping [phone]

## 2023-12-13 ENCOUNTER — Other Ambulatory Visit: Payer: Self-pay | Admitting: Internal Medicine

## 2023-12-13 MED ORDER — BETAMETHASONE DIPROPIONATE 0.05 % EX CREA
TOPICAL_CREAM | Freq: Two times a day (BID) | CUTANEOUS | 0 refills | Status: AC
Start: 2023-12-13 — End: ?

## 2023-12-13 NOTE — Telephone Encounter (Signed)
 Ok this was changed to diprolene asd- done erx

## 2023-12-13 NOTE — Addendum Note (Signed)
 Addended by: Roslyn Coombe on: 12/13/2023 10:20 AM   Modules accepted: Orders

## 2023-12-14 NOTE — Progress Notes (Signed)
 Remote ICD transmission.

## 2024-01-15 ENCOUNTER — Ambulatory Visit: Payer: Self-pay | Admitting: Internal Medicine

## 2024-01-15 ENCOUNTER — Other Ambulatory Visit (INDEPENDENT_AMBULATORY_CARE_PROVIDER_SITE_OTHER)

## 2024-01-15 DIAGNOSIS — E559 Vitamin D deficiency, unspecified: Secondary | ICD-10-CM | POA: Diagnosis not present

## 2024-01-15 DIAGNOSIS — E538 Deficiency of other specified B group vitamins: Secondary | ICD-10-CM

## 2024-01-15 DIAGNOSIS — R739 Hyperglycemia, unspecified: Secondary | ICD-10-CM | POA: Diagnosis not present

## 2024-01-15 LAB — CBC WITH DIFFERENTIAL/PLATELET
Basophils Absolute: 0 K/uL (ref 0.0–0.1)
Basophils Relative: 0.3 % (ref 0.0–3.0)
Eosinophils Absolute: 0.3 K/uL (ref 0.0–0.7)
Eosinophils Relative: 4.9 % (ref 0.0–5.0)
HCT: 34.4 % — ABNORMAL LOW (ref 36.0–46.0)
Hemoglobin: 11.1 g/dL — ABNORMAL LOW (ref 12.0–15.0)
Lymphocytes Relative: 49.4 % — ABNORMAL HIGH (ref 12.0–46.0)
Lymphs Abs: 2.9 K/uL (ref 0.7–4.0)
MCHC: 32.2 g/dL (ref 30.0–36.0)
MCV: 79.2 fl (ref 78.0–100.0)
Monocytes Absolute: 0.4 K/uL (ref 0.1–1.0)
Monocytes Relative: 6.3 % (ref 3.0–12.0)
Neutro Abs: 2.3 K/uL (ref 1.4–7.7)
Neutrophils Relative %: 39.1 % — ABNORMAL LOW (ref 43.0–77.0)
Platelets: 191 K/uL (ref 150.0–400.0)
RBC: 4.34 Mil/uL (ref 3.87–5.11)
RDW: 16.5 % — ABNORMAL HIGH (ref 11.5–15.5)
WBC: 5.9 K/uL (ref 4.0–10.5)

## 2024-01-15 LAB — LIPID PANEL
Cholesterol: 197 mg/dL (ref 0–200)
HDL: 46.5 mg/dL (ref >39.00)
LDL Cholesterol: 129 mg/dL — ABNORMAL HIGH (ref 0–99)
NonHDL: 150.43
Total CHOL/HDL Ratio: 4
Triglycerides: 109 mg/dL (ref 0.0–149.0)
VLDL: 21.8 mg/dL (ref 0.0–40.0)

## 2024-01-15 LAB — BASIC METABOLIC PANEL WITH GFR
BUN: 14 mg/dL (ref 6–23)
CO2: 25 meq/L (ref 19–32)
Calcium: 8.8 mg/dL (ref 8.4–10.5)
Chloride: 109 meq/L (ref 96–112)
Creatinine, Ser: 1.53 mg/dL — ABNORMAL HIGH (ref 0.40–1.20)
GFR: 31.8 mL/min — ABNORMAL LOW (ref >60.00)
Glucose, Bld: 107 mg/dL — ABNORMAL HIGH (ref 70–99)
Potassium: 4.7 meq/L (ref 3.5–5.1)
Sodium: 141 meq/L (ref 135–145)

## 2024-01-15 LAB — HEPATIC FUNCTION PANEL
ALT: 7 U/L (ref 0–35)
AST: 11 U/L (ref 0–37)
Albumin: 4 g/dL (ref 3.5–5.2)
Alkaline Phosphatase: 61 U/L (ref 39–117)
Bilirubin, Direct: 0.1 mg/dL (ref 0.0–0.3)
Total Bilirubin: 0.4 mg/dL (ref 0.2–1.2)
Total Protein: 6.5 g/dL (ref 6.0–8.3)

## 2024-01-15 LAB — VITAMIN D 25 HYDROXY (VIT D DEFICIENCY, FRACTURES): VITD: 21.63 ng/mL — ABNORMAL LOW (ref 30.00–100.00)

## 2024-01-15 LAB — HEMOGLOBIN A1C: Hgb A1c MFr Bld: 6.2 % (ref 4.6–6.5)

## 2024-01-15 LAB — TSH: TSH: 1.9 u[IU]/mL (ref 0.35–5.50)

## 2024-01-15 LAB — VITAMIN B12: Vitamin B-12: 1025 pg/mL — ABNORMAL HIGH (ref 211–911)

## 2024-01-30 ENCOUNTER — Ambulatory Visit: Payer: Medicare Other

## 2024-01-30 ENCOUNTER — Other Ambulatory Visit: Payer: Self-pay | Admitting: Internal Medicine

## 2024-01-30 DIAGNOSIS — I428 Other cardiomyopathies: Secondary | ICD-10-CM | POA: Diagnosis not present

## 2024-01-31 LAB — CUP PACEART REMOTE DEVICE CHECK
Battery Remaining Longevity: 58 mo
Battery Voltage: 2.98 V
Brady Statistic RV Percent Paced: 0.01 %
Date Time Interrogation Session: 20250722031609
HighPow Impedance: 40 Ohm
HighPow Impedance: 50 Ohm
Implantable Lead Connection Status: 753985
Implantable Lead Implant Date: 20081215
Implantable Lead Location: 753860
Implantable Lead Model: 6947
Implantable Pulse Generator Implant Date: 20190418
Lead Channel Impedance Value: 342 Ohm
Lead Channel Impedance Value: 380 Ohm
Lead Channel Pacing Threshold Amplitude: 1 V
Lead Channel Pacing Threshold Pulse Width: 0.4 ms
Lead Channel Sensing Intrinsic Amplitude: 20 mV
Lead Channel Sensing Intrinsic Amplitude: 20 mV
Lead Channel Setting Pacing Amplitude: 2.5 V
Lead Channel Setting Pacing Pulse Width: 0.4 ms
Lead Channel Setting Sensing Sensitivity: 0.3 mV
Zone Setting Status: 755011
Zone Setting Status: 755011

## 2024-02-01 ENCOUNTER — Ambulatory Visit: Payer: Self-pay | Admitting: Cardiology

## 2024-02-09 NOTE — Progress Notes (Signed)
 HPI: FU nonischemic cardiomyopathy. Cardiac catheterization August 2004 showed normal coronary arteries. 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 Patient was referred to Dr. Waddell by Dr. Fernande for consideration of ablation but Dr. Waddell felt medical therapy indicated. Nuclear study repeated April 2018 and showed ejection fraction 35% with no ischemia or infarction.  CPX March 2022 showed low normal functional capacity with primary limitation associated with body habitus and patient hyperventilating at peak exercise.  Monitor 12/22 showed sinus rhythm with nonsustained ventricular tachycardia longest 7 beats, brief PAT longest lasting 12.7 seconds and frequent PVCs (7.3%).  Echocardiogram August 2024 showed ejection fraction 20 to 25%, severe left ventricular enlargement, mild left atrial enlargement, mild to moderate mitral regurgitation.  Since I last saw her, she denies dyspnea, chest pain, palpitations or syncope.  Current Outpatient Medications  Medication Sig Dispense Refill   albuterol  (PROVENTIL  HFA;VENTOLIN  HFA) 108 (90 Base) MCG/ACT inhaler Inhale 1 puff into the lungs every 6 (six) hours as needed for wheezing or shortness of breath.     betamethasone  dipropionate 0.05 % cream Apply topically 2 (two) times daily. 30 g 0   cholecalciferol (VITAMIN D3) 25 MCG (1000 UT) tablet Take 1,000 Units by mouth daily.     dapagliflozin  propanediol (FARXIGA ) 10 MG TABS tablet TAKE 1 TABLET BY MOUTH EVERY DAY BEFORE BREAKFAST 90 tablet 1   lidocaine -prilocaine  (EMLA ) cream APPLY TO AFFECTED AREA ONCE AS DIRECTED 30 g 3   meloxicam  (MOBIC ) 15 MG tablet TAKE 1 TABLET BY MOUTH EVERY DAY AS NEEDED FOR PAIN 90 tablet 3   Multiple Vitamin (MULTIVITAMIN WITH MINERALS) TABS tablet Take 1 tablet by mouth daily.     nitroGLYCERIN  (NITROLINGUAL ) 0.4 MG/SPRAY spray Place 1 spray under the tongue every 5 (five) minutes x 3 doses as  needed for chest pain. 12 g 3   rizatriptan  (MAXALT ) 10 MG tablet Take 10 mg by mouth as needed for migraine. May repeat in 2 hours if needed     sacubitril -valsartan  (ENTRESTO ) 24-26 MG TAKE 1 TABLET BY MOUTH TWICE A DAY 180 tablet 0   scopolamine  (TRANSDERM-SCOP, 1.5 MG,) 1 MG/3DAYS Place 1 patch (1.5 mg total) onto the skin every 3 (three) days. 10 patch 0   spironolactone  (ALDACTONE ) 25 MG tablet Take 0.5 tablets (12.5 mg total) by mouth daily. 50 tablet 3   Tiotropium Bromide-Olodaterol (STIOLTO RESPIMAT ) 2.5-2.5 MCG/ACT AERS Inhale 2 puffs into the lungs daily. 4 g 12   tiZANidine  (ZANAFLEX ) 2 MG tablet TAKE 2 TABLETS BY MOUTH EVERY 6 HOURS AS NEEDED FOR MUSCLE SPASM 60 tablet 2   traMADol  (ULTRAM ) 50 MG tablet Take 1 tablet (50 mg total) by mouth every 6 (six) hours as needed. 120 tablet 2   carvedilol  (COREG ) 25 MG tablet TAKE ONE-HALF TABLET BY MOUTH  TWICE DAILY 100 tablet 3   furosemide  (LASIX ) 40 MG tablet TAKE 1 TABLET BY MOUTH TWICE  DAILY 180 tablet 3   rosuvastatin  (CRESTOR ) 20 MG tablet Take 1 tablet (20 mg total) by mouth daily. (Patient not taking: Reported on 02/22/2024) 90 tablet 3   No current facility-administered medications for this visit.     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   OVERACTIVE BLADDER 08/03/2010   PYELONEPHRITIS, HX OF 05/07/2007   SYSTOLIC HEART FAILURE, CHRONIC 12/28/2008   VITAMIN D  DEFICIENCY 06/17/2008    Past Surgical History:  Procedure Laterality Date   ABDOMINAL  HYSTERECTOMY     BREAST LUMPECTOMY WITH RADIOACTIVE SEED AND SENTINEL LYMPH NODE BIOPSY Right 10/11/2018   Procedure: RIGHT BREAST LUMPECTOMY WITH RADIOACTIVE SEED AND SENTINEL LYMPH NODE BIOPSY;  Surgeon: Aron Shoulders, MD;  Location: [facility] OR;  Service: General;  Laterality: Right;   ICD GENERATOR CHANGEOUT N/A 10/25/2017   Procedure: ICD GENERATOR CHANGEOUT;  Surgeon: Fernande Elspeth BROCKS, MD;  Location: [facility] INVASIVE CV LAB;  Service: Cardiovascular;  Laterality: N/A;   LAMINECTOMY     PORT-A-CATH REMOVAL N/A 05/08/2019   Procedure: REMOVAL OF PORT-A-CATH RIGHT CHEST;  Surgeon: Aron Shoulders, MD;  Location: [facility] OR;  Service: General;  Laterality: N/A;   PORTACATH PLACEMENT N/A 10/11/2018   Procedure: INSERTION PORT-A-CATH;  Surgeon: Aron Shoulders, MD;  Location: [facility] OR;  Service: General;  Laterality: N/A;   RE-EXCISION OF BREAST LUMPECTOMY Right 10/23/2018   Procedure: RE-EXCISION OF RIGHT BREAST LUMPECTOMY;  Surgeon: Aron Shoulders, MD;  Location: [facility] OR;  Service: General;  Laterality: Right;    Social History   Socioeconomic History   Marital status: Widowed    Spouse name: Not on file   Number of children: 3   Years of education: Not on file   Highest education level: Associate degree: occupational, Scientist, product/process development, or vocational program  Occupational History   Occupation: retired Conservation officer, nature: RETIRED  Tobacco Use   Smoking status: Never   Smokeless tobacco: Never  Substance and Sexual Activity   Alcohol use: No   Drug use: No   Sexual activity: Never  Other Topics Concern   Not on file  Social History Narrative   ** Merged History Encounter **       Social Drivers of Health   Financial Resource Strain: Low Risk  (03/30/2023)   Overall Financial Resource Strain (CARDIA)    Difficulty of Paying Living Expenses: Not hard at all  Food Insecurity: No Food Insecurity (03/30/2023)   Hunger Vital Sign    Worried About Running Out of Food in the Last Year: Never true    Ran Out of  Food in the Last Year: Never true  Transportation Needs: No Transportation Needs (03/30/2023)   PRAPARE - Administrator, Civil Service (Medical): No    Lack of Transportation (Non-Medical): No  Physical Activity: Insufficiently Active (03/30/2023)   Exercise Vital Sign    Days of Exercise per Week: 5 days    Minutes of Exercise per Session: 20 min  Stress: No Stress Concern Present (03/30/2023)   [employer] of Occupational Health - Occupational Stress Questionnaire    Feeling of Stress : Not at all  Social Connections: Socially Integrated (03/30/2023)   Social Connection and Isolation Panel    Frequency of Communication with Friends and Family: More than three times a week    Frequency of Social Gatherings with Friends and Family: More than three times a week    Attends Religious Services: More than 4 times per year    Active Member of [employer] or Organizations: Yes    Attends Banker Meetings: More than  4 times per year    Marital Status: Married  Catering manager Violence: Not At Risk (03/30/2023)   Humiliation, Afraid, Rape, and Kick questionnaire    Fear of Current or Ex-Partner: No    Emotionally Abused: No    Physically Abused: No    Sexually Abused: No    Family History  Problem Relation Age of Onset   Arthritis Other    Coronary artery disease Other    Kidney disease Other    Heart disease Mother     ROS: no fevers or chills, productive cough, hemoptysis, dysphasia, odynophagia, melena, hematochezia, dysuria, hematuria, rash, seizure activity, orthopnea, PND, pedal edema, claudication. Remaining systems are negative.  Physical Exam: Well-developed well-nourished in no acute distress.  Skin is warm and dry.  HEENT is normal.  Neck is supple.  Chest is clear to auscultation with normal expansion.  Cardiovascular exam is regular rate and rhythm.  Abdominal exam nontender or distended. No masses palpated. Extremities show no edema. neuro  grossly intact  EKG Interpretation Date/Time:  Thursday February 22 2024 11:34:41 EDT Ventricular Rate:  80 PR Interval:  162 QRS Duration:  142 QT Interval:  388 QTC Calculation: 447 R Axis:   -23  Text Interpretation: Normal sinus rhythm Left bundle branch block Confirmed by Pietro Rogue ([id number]) on 02/22/2024 11:49:43 AM    A/P  1 chronic systolic congestive heart failure-patient appears to be euvolemic today on examination.  Continue present dose of Lasix , Farxiga , spironolactone .    2 nonischemic cardiomyopathy-continue present dose of carvedilol  and Entresto .  3 hyperlipidemia-continue statin.    4 hypertension-patient's blood pressure is controlled.  Continue present medical regimen.  5 history of PVCs-continue carvedilol .  Previously seen by Dr. Waddell and ablation of PVCs felt not indicated.  6 ICD-Per EP.  Rogue Pietro, MD

## 2024-02-19 ENCOUNTER — Other Ambulatory Visit: Payer: Self-pay | Admitting: Cardiology

## 2024-02-22 ENCOUNTER — Ambulatory Visit: Attending: Cardiology | Admitting: Cardiology

## 2024-02-22 ENCOUNTER — Encounter: Payer: Self-pay | Admitting: Cardiology

## 2024-02-22 VITALS — BP 120/70 | HR 80 | Ht 67.0 in | Wt 218.0 lb

## 2024-02-22 DIAGNOSIS — I5022 Chronic systolic (congestive) heart failure: Secondary | ICD-10-CM

## 2024-02-22 DIAGNOSIS — I428 Other cardiomyopathies: Secondary | ICD-10-CM | POA: Diagnosis not present

## 2024-02-22 DIAGNOSIS — Z95810 Presence of automatic (implantable) cardiac defibrillator: Secondary | ICD-10-CM

## 2024-02-22 DIAGNOSIS — I493 Ventricular premature depolarization: Secondary | ICD-10-CM | POA: Diagnosis not present

## 2024-02-22 DIAGNOSIS — I10 Essential (primary) hypertension: Secondary | ICD-10-CM | POA: Diagnosis not present

## 2024-02-22 NOTE — Patient Instructions (Signed)
   Follow-Up: At [facility], you and your health needs are our priority.  As part of our continuing mission to provide you with exceptional heart care, our providers are all part of one team.  This team includes your primary Cardiologist (physician) and Advanced Practice Providers or APPs (Physician Assistants and Nurse Practitioners) who all work together to provide you with the care you need, when you need it.  Your next appointment:   6 month(s)  Provider:   REDELL SHALLOW MD

## 2024-02-28 NOTE — Progress Notes (Unsigned)
  Electrophysiology Office Follow up Visit Note:    Date:  03/01/2024   ID:  [name] [name], DOB Jan 15, 1941, MRN [id number]  PCP:  Norleen Lynwood ORN, MD  [facility] Cardiologist:  Redell Shallow, MD  [facility] Electrophysiologist:  OLE ONEIDA HOLTS, MD    Interval History:     [name] [name] is a 83 y.o. female who presents for a follow up visit.   The patient presents for follow-up.  Previously followed with Dr. Fernande, last appointment June 15, 2021.  She has an ICD in situ.  Also a history of hypertension, hyperlipidemia, PVCs, nonischemic cardiomyopathy and ventricular tachycardia with appropriate therapy.  She is doing well.  She remains active.  No complaints today.  She takes her medicines without issues.      Past medical, surgical, social and family history were reviewed.  ROS:   Please see the history of present illness.    All other systems reviewed and are negative.  EKGs/Labs/Other Studies Reviewed:    The following studies were reviewed today:  March 01, 2024 in-clinic device interrogation personally reviewed Battery and lead parameter stable. No programming changes made today.         Physical Exam:    VS:  BP 126/72   Pulse 79   Ht 5' 7 (1.702 m)   Wt 221 lb 12.8 oz (100.6 kg)   SpO2 97%   BMI 34.74 kg/m     Wt Readings from Last 3 Encounters:  03/01/24 221 lb 12.8 oz (100.6 kg)  02/22/24 218 lb (98.9 kg)  12/11/23 218 lb (98.9 kg)     GEN: no distress CARD: RRR, No MRG.  ICD pocket well-healed RESP: No IWOB. CTAB.      ASSESSMENT:    1. Chronic systolic heart failure (HCC)   2. NICM (nonischemic cardiomyopathy) (HCC)   3. ICD (implantable cardioverter-defibrillator) in place   4. PVC's (premature ventricular contractions)    PLAN:    In order of problems listed above:  #Chronic systolic heart failure #PVCs #Ventricular tachycardia #ICD in situ NYHA class II today.  Warm and dry on exam.  ICD functioning  appropriately, reprogram shock vectors today per industry recommendations.  Continue Coreg , spironolactone , Entresto , Lasix , Farxiga   Follow-up 1 year with APP   Signed, OLE HOLTS, MD, [facility], [facility] 03/01/2024 11:36 AM    Electrophysiology [facility]

## 2024-02-29 ENCOUNTER — Other Ambulatory Visit: Payer: Self-pay | Admitting: Cardiology

## 2024-02-29 DIAGNOSIS — I428 Other cardiomyopathies: Secondary | ICD-10-CM

## 2024-03-01 ENCOUNTER — Encounter: Payer: Self-pay | Admitting: Cardiology

## 2024-03-01 ENCOUNTER — Ambulatory Visit: Attending: Cardiology | Admitting: Cardiology

## 2024-03-01 VITALS — BP 126/72 | HR 79 | Ht 67.0 in | Wt 221.8 lb

## 2024-03-01 DIAGNOSIS — I5022 Chronic systolic (congestive) heart failure: Secondary | ICD-10-CM

## 2024-03-01 DIAGNOSIS — I493 Ventricular premature depolarization: Secondary | ICD-10-CM

## 2024-03-01 DIAGNOSIS — Z95810 Presence of automatic (implantable) cardiac defibrillator: Secondary | ICD-10-CM

## 2024-03-01 DIAGNOSIS — I428 Other cardiomyopathies: Secondary | ICD-10-CM

## 2024-03-01 LAB — CUP PACEART INCLINIC DEVICE CHECK
Date Time Interrogation Session: 20250822121041
Implantable Lead Connection Status: 753985
Implantable Lead Implant Date: 20081215
Implantable Lead Location: 753860
Implantable Lead Model: 6947
Implantable Pulse Generator Implant Date: 20190418

## 2024-03-01 NOTE — Patient Instructions (Signed)
 Medication Instructions:  Your physician recommends that you continue on your current medications as directed. Please refer to the Current Medication list given to you today.  *If you need a refill on your cardiac medications before your next appointment, please call your pharmacy*  Lab Work: None ordered.  If you have labs (blood work) drawn today and your tests are completely normal, you will receive your results only by: MyChart Message (if you have MyChart) OR A paper copy in the mail If you have any lab test that is abnormal or we need to change your treatment, we will call you to review the results.  Testing/Procedures: None ordered.    Follow-Up: At [facility], you and your health needs are our priority.  As part of our continuing mission to provide you with exceptional heart care, our providers are all part of one team.  This team includes your primary Cardiologist (physician) and Advanced Practice Providers or APPs (Physician Assistants and Nurse Practitioners) who all work together to provide you with the care you need, when you need it.  Your next appointment:   12 months with Dr Hiram APP

## 2024-03-04 ENCOUNTER — Ambulatory Visit: Payer: Self-pay | Admitting: Cardiology

## 2024-03-05 ENCOUNTER — Telehealth: Payer: Self-pay

## 2024-03-05 NOTE — Telephone Encounter (Signed)
 Called pt lvm for pt to call back to confirm address so that a new monitor can be sent out

## 2024-03-06 NOTE — Telephone Encounter (Signed)
 Spoke with pt and confirmed mailing address and monitor is ordered. Pt aware it will take 7-10 business days

## 2024-03-27 ENCOUNTER — Other Ambulatory Visit: Payer: Self-pay | Admitting: Internal Medicine

## 2024-03-27 DIAGNOSIS — C50511 Malignant neoplasm of lower-outer quadrant of right female breast: Secondary | ICD-10-CM

## 2024-04-02 ENCOUNTER — Ambulatory Visit: Payer: Medicare Other

## 2024-04-02 ENCOUNTER — Ambulatory Visit (INDEPENDENT_AMBULATORY_CARE_PROVIDER_SITE_OTHER)

## 2024-04-02 VITALS — Ht 67.0 in | Wt 221.0 lb

## 2024-04-02 DIAGNOSIS — Z0000 Encounter for general adult medical examination without abnormal findings: Secondary | ICD-10-CM

## 2024-04-02 DIAGNOSIS — Z780 Asymptomatic menopausal state: Secondary | ICD-10-CM

## 2024-04-02 NOTE — Patient Instructions (Addendum)
 [name] [name],  Thank you for taking the time for your Medicare Wellness Visit. I appreciate your continued commitment to your health goals. Please review the care plan we discussed, and feel free to reach out if I can assist you further.  Medicare recommends these wellness visits once per year to help you and your care team stay ahead of potential health issues. These visits are designed to focus on prevention, allowing your provider to concentrate on managing your acute and chronic conditions during your regular appointments.  Please note that Annual Wellness Visits do not include a physical exam. Some assessments may be limited, especially if the visit was conducted virtually. If needed, we may recommend a separate in-person follow-up with your provider.  Ongoing Care Seeing your primary care provider every 3 to 6 months helps us  monitor your health and provide consistent, personalized care.   Referrals If a referral was made during today's visit and you haven't received any updates within two weeks, please contact the referred provider directly to check on the status.  Recommended Screenings:  Health Maintenance  Topic Date Due   Zoster (Shingles) Vaccine (1 of 2) Never done   DEXA scan (bone density measurement)  Never done   Medicare Annual Wellness Visit  04/02/2025   DTaP/Tdap/Td vaccine (2 - Td or Tdap) 05/22/2028   Pneumococcal Vaccine for age over 90  Completed   HPV Vaccine  Aged Out   Meningitis B Vaccine  Aged Out   Flu Shot  Discontinued   COVID-19 Vaccine  Discontinued   Hepatitis C Screening  Discontinued       04/02/2024    2:38 PM  Advanced Directives  Does Patient Have a Medical Advance Directive? Yes  Type of Estate agent of [place];Living will  Copy of Healthcare Power of Attorney in Chart? No - copy requested   Advance Care Planning is important because it: Ensures you receive medical care that aligns with your values, goals, and  preferences. Provides guidance to your family and loved ones, reducing the emotional burden of decision-making during critical moments.  Vision: Annual vision screenings are recommended for early detection of glaucoma, cataracts, and diabetic retinopathy. These exams can also reveal signs of chronic conditions such as diabetes and high blood pressure.  Dental: Annual dental screenings help detect early signs of oral cancer, gum disease, and other conditions linked to overall health, including heart disease and diabetes.  Please see the attached documents for additional preventive care recommendations.    Managing Pain Without Opioids Opioids are strong medicines used to treat moderate to severe pain. For some people, especially those who have long-term (chronic) pain, opioids may not be the best choice for pain management due to: Side effects like nausea, constipation, and sleepiness. The risk of addiction (opioid use disorder). The longer you take opioids, the greater your risk of addiction. Pain that lasts for more than 3 months is called chronic pain. Managing chronic pain usually requires more than one approach and is often provided by a team of health care providers working together (multidisciplinary approach). Pain management may be done at a pain management center or pain clinic. How to manage pain without the use of opioids Use non-opioid medicines Non-opioid medicines for pain may include: Over-the-counter or prescription non-steroidal anti-inflammatory drugs (NSAIDs). These may be the first medicines used for pain. They work well for muscle and bone pain, and they reduce swelling. Acetaminophen . This over-the-counter medicine may work well for milder pain but not swelling.  Antidepressants. These may be used to treat chronic pain. A certain type of antidepressant (tricyclics) is often used. These medicines are given in lower doses for pain than when used for depression. Anticonvulsants.  These are usually used to treat seizures but may also reduce nerve (neuropathic) pain. Muscle relaxants. These relieve pain caused by sudden muscle tightening (spasms). You may also use a pain medicine that is applied to the skin as a patch, cream, or gel (topical analgesic), such as a numbing medicine. These may cause fewer side effects than medicines taken by mouth. Do certain therapies as directed Some therapies can help with pain management. They include: Physical therapy. You will do exercises to gain strength and flexibility. A physical therapist may teach you exercises to move and stretch parts of your body that are weak, stiff, or painful. You can learn these exercises at physical therapy visits and practice them at home. Physical therapy may also involve: Massage. Heat wraps or applying heat or cold to affected areas. Electrical signals that interrupt pain signals (transcutaneous electrical nerve stimulation, TENS). Weak lasers that reduce pain and swelling (low-level laser therapy). Signals from your body that help you learn to regulate pain (biofeedback). Occupational therapy. This helps you to learn ways to function at home and work with less pain. Recreational therapy. This involves trying new activities or hobbies, such as a physical activity or drawing. Mental health therapy, including: Cognitive behavioral therapy (CBT). This helps you learn coping skills for dealing with pain. Acceptance and commitment therapy (ACT) to change the way you think and react to pain. Relaxation therapies, including muscle relaxation exercises and mindfulness-based stress reduction. Pain management counseling. This may be individual, family, or group counseling.  Receive medical treatments Medical treatments for pain management include: Nerve block injections. These may include a pain blocker and anti-inflammatory medicines. You may have injections: Near the spine to relieve chronic back or neck  pain. Into joints to relieve back or joint pain. Into nerve areas that supply a painful area to relieve body pain. Into muscles (trigger point injections) to relieve some painful muscle conditions. A medical device placed near your spine to help block pain signals and relieve nerve pain or chronic back pain (spinal cord stimulation device). Acupuncture. Follow these instructions at home Medicines Take over-the-counter and prescription medicines only as told by your health care provider. If you are taking pain medicine, ask your health care providers about possible side effects to watch out for. Do not drive or use heavy machinery while taking prescription opioid pain medicine. Lifestyle  Do not use drugs or alcohol to reduce pain. If you drink alcohol, limit how much you have to: 0-1 drink a day for women who are not pregnant. 0-2 drinks a day for men. Know how much alcohol is in a drink. In the [place] one drink equals one 12 oz bottle of beer (355 mL), one 5 oz glass of wine (148 mL), or one 1 oz glass of hard liquor (44 mL). Do not use any products that contain nicotine or tobacco. These products include cigarettes, chewing tobacco, and vaping devices, such as e-cigarettes. If you need help quitting, ask your health care provider. Eat a healthy diet and maintain a healthy weight. Poor diet and excess weight may make pain worse. Eat foods that are high in fiber. These include fresh fruits and vegetables, whole grains, and beans. Limit foods that are high in fat and processed sugars, such as fried and sweet foods. Exercise regularly. Exercise lowers  stress and may help relieve pain. Ask your health care provider what activities and exercises are safe for you. If your health care provider approves, join an exercise class that combines movement and stress reduction. Examples include yoga and tai chi. Get enough sleep. Lack of sleep may make pain worse. Lower stress as much as possible.  Practice stress reduction techniques as told by your therapist. General instructions Work with all your pain management providers to find the treatments that work best for you. You are an important member of your pain management team. There are many things you can do to reduce pain on your own. Consider joining an online or in-person support group for people who have chronic pain. Keep all follow-up visits. This is important. Where to find more information You can find more information about managing pain without opioids from: American Academy of Pain Medicine: painmed.org Institute for Chronic Pain: instituteforchronicpain.org American Chronic Pain Association: theacpa.org Contact a health care provider if: You have side effects from pain medicine. Your pain gets worse or does not get better with treatments or home therapy. You are struggling with anxiety or depression. Summary Many types of pain can be managed without opioids. Chronic pain may respond better to pain management without opioids. Pain is best managed when you and a team of health care providers work together. Pain management without opioids may include non-opioid medicines, medical treatments, physical therapy, mental health therapy, and lifestyle changes. Tell your health care providers if your pain gets worse or is not being managed well enough. This information is not intended to replace advice given to you by your health care provider. Make sure you discuss any questions you have with your health care provider. Document Revised: 10/07/2020 Document Reviewed: 10/07/2020 Elsevier Patient Education  2024 [employer].

## 2024-04-02 NOTE — Progress Notes (Signed)
 Subjective:   [name] [name] is a 83 y.o. who presents for a Medicare Wellness preventive visit.  As a reminder, Annual Wellness Visits don't include a physical exam, and some assessments may be limited, especially if this visit is performed virtually. We may recommend an in-person follow-up visit with your provider if needed.  Visit Complete: Virtual I connected with  [name] [name] on 04/02/24 by a audio enabled telemedicine application and verified that I am speaking with the correct person using two identifiers.  Patient Location: Home  Provider Location: Office/Clinic  I discussed the limitations of evaluation and management by telemedicine. The patient expressed understanding and agreed to proceed.  Vital Signs: Because this visit was a virtual/telehealth visit, some criteria may be missing or patient reported. Any vitals not documented were not able to be obtained and vitals that have been documented are patient reported.  VideoDeclined- This patient declined Librarian, academic. Therefore the visit was completed with audio only.  Persons Participating in Visit: Patient.  AWV Questionnaire: No: Patient Medicare AWV questionnaire was not completed prior to this visit.  Cardiac Risk Factors include: advanced age (>64men, >56 women);dyslipidemia;hypertension;obesity (BMI >30kg/m2)     Objective:    Today's Vitals   04/02/24 1439  Weight: 221 lb (100.2 kg)  Height: 5' 7 (1.702 m)   Body mass index is 34.61 kg/m.     04/02/2024    2:38 PM 03/30/2023    2:44 PM 05/17/2022    3:00 PM 01/25/2022   10:56 AM 11/25/2020   10:16 AM 07/22/2019   12:13 PM 05/03/2019    8:16 AM  Advanced Directives  Does Patient Have a Medical Advance Directive? Yes Yes No Yes Yes Yes No  Type of Estate agent of [place];Living will Healthcare Power of [place];Living will  Living will;Healthcare Power of Attorney Living will    Does patient want  to make changes to medical advance directive?    No - Patient declined No - Patient declined    Copy of Healthcare Power of Attorney in Chart? No - copy requested No - copy requested  No - copy requested     Would patient like information on creating a medical advance directive?       No - Patient declined    Current Medications (verified) Outpatient Encounter Medications as of 04/02/2024  Medication Sig   albuterol  (PROVENTIL  HFA;VENTOLIN  HFA) 108 (90 Base) MCG/ACT inhaler Inhale 1 puff into the lungs every 6 (six) hours as needed for wheezing or shortness of breath.   betamethasone  dipropionate 0.05 % cream Apply topically 2 (two) times daily.   carvedilol  (COREG ) 25 MG tablet TAKE ONE-HALF TABLET BY MOUTH  TWICE DAILY   cholecalciferol (VITAMIN D3) 25 MCG (1000 UT) tablet Take 1,000 Units by mouth daily.   dapagliflozin  propanediol (FARXIGA ) 10 MG TABS tablet TAKE 1 TABLET BY MOUTH EVERY DAY BEFORE BREAKFAST   furosemide  (LASIX ) 40 MG tablet TAKE 1 TABLET BY MOUTH TWICE  DAILY   lidocaine -prilocaine  (EMLA ) cream APPLY TO AFFECTED AREA ONCE AS DIRECTED   meloxicam  (MOBIC ) 15 MG tablet TAKE 1 TABLET BY MOUTH EVERY DAY AS NEEDED FOR PAIN   Multiple Vitamin (MULTIVITAMIN WITH MINERALS) TABS tablet Take 1 tablet by mouth daily.   nitroGLYCERIN  (NITROLINGUAL ) 0.4 MG/SPRAY spray Place 1 spray under the tongue every 5 (five) minutes x 3 doses as needed for chest pain.   rizatriptan  (MAXALT ) 10 MG tablet Take 10 mg by mouth as needed for  migraine. May repeat in 2 hours if needed   rosuvastatin  (CRESTOR ) 20 MG tablet Take 1 tablet (20 mg total) by mouth daily.   sacubitril -valsartan  (ENTRESTO ) 24-26 MG TAKE 1 TABLET BY MOUTH TWICE A DAY   scopolamine  (TRANSDERM-SCOP, 1.5 MG,) 1 MG/3DAYS Place 1 patch (1.5 mg total) onto the skin every 3 (three) days.   spironolactone  (ALDACTONE ) 25 MG tablet Take 0.5 tablets (12.5 mg total) by mouth daily.   Tiotropium Bromide-Olodaterol (STIOLTO RESPIMAT ) 2.5-2.5  MCG/ACT AERS Inhale 2 puffs into the lungs daily.   tiZANidine  (ZANAFLEX ) 2 MG tablet TAKE 2 TABLETS BY MOUTH EVERY 6 HOURS AS NEEDED FOR MUSCLE SPASM   traMADol  (ULTRAM ) 50 MG tablet Take 1 tablet (50 mg total) by mouth every 6 (six) hours as needed.   No facility-administered encounter medications on file as of 04/02/2024.    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   OVERACTIVE BLADDER 08/03/2010   PYELONEPHRITIS, HX OF 05/07/2007   SYSTOLIC HEART FAILURE, CHRONIC 12/28/2008   VITAMIN D  DEFICIENCY 06/17/2008   Past Surgical History:  Procedure Laterality Date   ABDOMINAL HYSTERECTOMY     BREAST LUMPECTOMY WITH RADIOACTIVE SEED AND SENTINEL LYMPH NODE BIOPSY Right 10/11/2018   Procedure: RIGHT BREAST LUMPECTOMY WITH RADIOACTIVE SEED AND SENTINEL LYMPH NODE BIOPSY;  Surgeon: Aron Shoulders, MD;  Location: [facility] OR;  Service: General;  Laterality: Right;   ICD GENERATOR CHANGEOUT N/A 10/25/2017   Procedure: ICD GENERATOR CHANGEOUT;  Surgeon: Fernande Elspeth BROCKS, MD;  Location: [facility] INVASIVE CV LAB;  Service: Cardiovascular;  Laterality: N/A;   LAMINECTOMY     PORT-A-CATH REMOVAL N/A 05/08/2019   Procedure: REMOVAL OF PORT-A-CATH RIGHT CHEST;  Surgeon: Aron Shoulders, MD;  Location: [facility] OR;  Service: General;  Laterality: N/A;   PORTACATH PLACEMENT N/A 10/11/2018   Procedure: INSERTION PORT-A-CATH;  Surgeon: Aron Shoulders, MD;  Location: [facility] OR;  Service: General;   Laterality: N/A;   RE-EXCISION OF BREAST LUMPECTOMY Right 10/23/2018   Procedure: RE-EXCISION OF RIGHT BREAST LUMPECTOMY;  Surgeon: Aron Shoulders, MD;  Location: [facility] OR;  Service: General;  Laterality: Right;   Family History  Problem Relation Age of Onset   Arthritis Other    Coronary artery disease Other    Kidney disease Other    Heart disease Mother    Social History   Socioeconomic History   Marital status: Widowed    Spouse name: Not on file   Number of children: 3   Years of education: Not on file   Highest education level: Associate degree: occupational, Scientist, product/process development, or vocational program  Occupational History   Occupation: retired Conservation officer, nature: RETIRED  Tobacco Use   Smoking status: Never   Smokeless tobacco: Never  Substance and Sexual Activity   Alcohol use: No   Drug use: No   Sexual activity: Not Currently  Other Topics Concern   Not on file  Social History Narrative   ** Merged History Encounter **    Widowed   Social Drivers of  Health   Financial Resource Strain: Low Risk  (04/02/2024)   Overall Financial Resource Strain (CARDIA)    Difficulty of Paying Living Expenses: Not hard at all  Food Insecurity: No Food Insecurity (04/02/2024)   Hunger Vital Sign    Worried About Running Out of Food in the Last Year: Never true    Ran Out of Food in the Last Year: Never true  Transportation Needs: No Transportation Needs (04/02/2024)   PRAPARE - Administrator, Civil Service (Medical): No    Lack of Transportation (Non-Medical): No  Physical Activity: Sufficiently Active (04/02/2024)   Exercise Vital Sign    Days of Exercise per Week: 7 days    Minutes of Exercise per Session: 30 min  Stress: No Stress Concern Present (04/02/2024)   [employer] of Occupational Health - Occupational Stress Questionnaire    Feeling of Stress: Not at all  Social Connections: Moderately Integrated (04/02/2024)   Social Connection and Isolation Panel     Frequency of Communication with Friends and Family: More than three times a week    Frequency of Social Gatherings with Friends and Family: More than three times a week    Attends Religious Services: More than 4 times per year    Active Member of [name] or Organizations: Yes    Attends Banker Meetings: More than 4 times per year    Marital Status: Widowed    Tobacco Counseling Counseling given: Not Answered    Clinical Intake:  Pre-visit preparation completed: Yes  Pain : No/denies pain     BMI - recorded: 34.61 Nutritional Status: BMI > 30  Obese Nutritional Risks: None Diabetes: No  Lab Results  Component Value Date   HGBA1C 6.2 01/15/2024   HGBA1C 6.2 02/07/2023   HGBA1C 6.1 12/01/2021     How often do you need to have someone help you when you read instructions, pamphlets, or other written materials from your doctor or pharmacy?: 1 - Never  Interpreter Needed?: No  Information entered by :: Verdie Saba, CMA   Activities of Daily Living     04/02/2024    2:41 PM  In your present state of health, do you have any difficulty performing the following activities:  Hearing? 0  Vision? 0  Difficulty concentrating or making decisions? 0  Walking or climbing stairs? 0  Dressing or bathing? 0  Doing errands, shopping? 0  Preparing Food and eating ? N  Using the Toilet? N  In the past six months, have you accidently leaked urine? Y  Comment wears a pantyliner  Do you have problems with loss of bowel control? N  Managing your Medications? N  Managing your Finances? N  Housekeeping or managing your Housekeeping? N    Patient Care Team: Norleen Lynwood ORN, MD as PCP - General Pietro, Redell RAMAN, MD as PCP - Cardiology (Cardiology) Cindie Ole DASEN, MD as PCP - Electrophysiology (Cardiology) Pietro Redell RAMAN, MD as Consulting Physician (Cardiology) Fernande Elspeth BROCKS, MD as Consulting Physician (Cardiology) Odean Potts, MD as Consulting Physician  (Hematology and Oncology) Aron Shoulders, MD as Consulting Physician (General Surgery) Izell Domino, MD as Attending Physician (Radiation Oncology)  I have updated your Care Teams any recent Medical Services you may have received from other providers in the past year.     Assessment:   This is a routine wellness examination for [name] [name].  Hearing/Vision screen Hearing Screening - Comments:: Denies hearing difficulties   Vision Screening - Comments:: Wears rx  glasses - up to date with routine eye exams with an Ophthalmologist    Goals Addressed               This Visit's Progress     Patient Stated (pt-stated)        Patient stated she plans to lose weight - about 10-15lbs       Depression Screen     04/02/2024    2:42 PM 12/11/2023    1:39 PM 08/10/2023   11:44 AM 03/30/2023    2:41 PM 02/07/2023   10:07 AM 03/23/2022    8:39 AM 01/25/2022   10:55 AM  PHQ 2/9 Scores  PHQ - 2 Score 0 0 0 0 0 0 0  PHQ- 9 Score 0     2     Fall Risk     04/02/2024    2:42 PM 12/11/2023    1:54 PM 08/10/2023   11:44 AM 03/30/2023    2:43 PM 02/07/2023   10:07 AM  Fall Risk   Falls in the past year? 0 0 0 0 0  Number falls in past yr: 0 0 0 0 0  Injury with Fall? 0 0 0 0 0  Risk for fall due to : No Fall Risks No Fall Risks No Fall Risks No Fall Risks No Fall Risks  Follow up Falls evaluation completed;Falls prevention discussed Falls evaluation completed Falls evaluation completed Falls prevention discussed Falls evaluation completed    MEDICARE RISK AT HOME:  Medicare Risk at Home Any stairs in or around the home?: Yes If so, are there any without handrails?: No Home free of loose throw rugs in walkways, pet beds, electrical cords, etc?: Yes Adequate lighting in your home to reduce risk of falls?: Yes Life alert?: No Use of a cane, walker or w/c?: No Grab bars in the bathroom?: No Shower chair or bench in shower?: Yes Elevated toilet seat or a handicapped toilet?: No  TIMED UP AND  GO:  Was the test performed?  No  Cognitive Function: 6CIT completed    05/08/2015    9:47 AM  MMSE - Mini Mental State Exam  Not completed: --        04/02/2024    2:45 PM 03/30/2023    2:44 PM 01/25/2022   11:07 AM  6CIT Screen  What Year? 0 points 0 points 0 points  What month? 0 points 0 points 0 points  What time? 0 points 0 points 0 points  Count back from 20 0 points 0 points 0 points  Months in reverse 0 points 0 points 0 points  Repeat phrase 0 points 0 points 0 points  Total Score 0 points 0 points 0 points    Immunizations Immunization History  Administered Date(s) Administered   Fluad Quad(high Dose 65+) 05/31/2019, 03/23/2022   INFLUENZA, HIGH DOSE SEASONAL PF 08/27/2023   Influenza-Unspecified 03/18/2021   PFIZER(Purple Top)SARS-COV-2 Vaccination 08/28/2019, 09/18/2019, 04/24/2020, 04/24/2020, 04/03/2021   Pneumococcal Conjugate-13 02/07/2013   Pneumococcal Polysaccharide-23 05/01/2014   Tdap 05/22/2018    Screening Tests Health Maintenance  Topic Date Due   Zoster Vaccines- Shingrix (1 of 2) Never done   DEXA SCAN  Never done   Medicare Annual Wellness (AWV)  04/02/2025   DTaP/Tdap/Td (2 - Td or Tdap) 05/22/2028   Pneumococcal Vaccine: 50+ Years  Completed   HPV VACCINES  Aged Out   Meningococcal B Vaccine  Aged Out   Influenza Vaccine  Discontinued   COVID-19 Vaccine  Discontinued  Hepatitis C Screening  Discontinued    Health Maintenance Items Addressed:  DEXA ordered today  Additional Screening:  Vision Screening: Recommended annual ophthalmology exams for early detection of glaucoma and other disorders of the eye. Is the patient up to date with their annual eye exam?  Yes  Who is the provider or what is the name of the office in which the patient attends annual eye exams? Patient unable to recall the name of the Ophthalmologist   Dental Screening: Recommended annual dental exams for proper oral hygiene  Community Resource Referral /  Chronic Care Management: CRR required this visit?  No   CCM required this visit?  No   Plan:    I have personally reviewed and noted the following in the patient's chart:   Medical and social history Use of alcohol, tobacco or illicit drugs  Current medications and supplements including opioid prescriptions. Patient is not currently taking opioid prescriptions. Functional ability and status Nutritional status Physical activity Advanced directives List of other physicians Hospitalizations, surgeries, and ER visits in previous 12 months Vitals Screenings to include cognitive, depression, and falls Referrals and appointments  In addition, I have reviewed and discussed with patient certain preventive protocols, quality metrics, and best practice recommendations. A written personalized care plan for preventive services as well as general preventive health recommendations were provided to patient.   Verdie CHRISTELLA Saba, CMA   04/02/2024   After Visit Summary: (MyChart) Due to this being a telephonic visit, the after visit summary with patients personalized plan was offered to patient via MyChart   Notes: Nothing significant to report at this time.

## 2024-04-07 ENCOUNTER — Other Ambulatory Visit: Payer: Self-pay | Admitting: Internal Medicine

## 2024-04-07 DIAGNOSIS — I5022 Chronic systolic (congestive) heart failure: Secondary | ICD-10-CM

## 2024-04-09 DIAGNOSIS — C50511 Malignant neoplasm of lower-outer quadrant of right female breast: Secondary | ICD-10-CM | POA: Diagnosis not present

## 2024-04-09 DIAGNOSIS — Z23 Encounter for immunization: Secondary | ICD-10-CM | POA: Diagnosis not present

## 2024-04-09 DIAGNOSIS — N1831 Chronic kidney disease, stage 3a: Secondary | ICD-10-CM | POA: Diagnosis not present

## 2024-04-09 DIAGNOSIS — I5020 Unspecified systolic (congestive) heart failure: Secondary | ICD-10-CM | POA: Diagnosis not present

## 2024-04-09 DIAGNOSIS — D631 Anemia in chronic kidney disease: Secondary | ICD-10-CM | POA: Diagnosis not present

## 2024-04-09 DIAGNOSIS — Z95810 Presence of automatic (implantable) cardiac defibrillator: Secondary | ICD-10-CM | POA: Diagnosis not present

## 2024-04-12 NOTE — Progress Notes (Signed)
 Remote ICD Transmission

## 2024-04-23 DIAGNOSIS — G8928 Other chronic postprocedural pain: Secondary | ICD-10-CM | POA: Diagnosis not present

## 2024-04-23 DIAGNOSIS — C50511 Malignant neoplasm of lower-outer quadrant of right female breast: Secondary | ICD-10-CM | POA: Diagnosis not present

## 2024-04-23 DIAGNOSIS — Z171 Estrogen receptor negative status [ER-]: Secondary | ICD-10-CM | POA: Diagnosis not present

## 2024-04-28 ENCOUNTER — Other Ambulatory Visit: Payer: Self-pay | Admitting: Cardiology

## 2024-04-28 DIAGNOSIS — I428 Other cardiomyopathies: Secondary | ICD-10-CM

## 2024-04-30 ENCOUNTER — Ambulatory Visit: Payer: Medicare Other | Attending: Internal Medicine

## 2024-04-30 DIAGNOSIS — I428 Other cardiomyopathies: Secondary | ICD-10-CM

## 2024-05-02 LAB — CUP PACEART REMOTE DEVICE CHECK
Battery Remaining Longevity: 50 mo
Battery Voltage: 2.95 V
Brady Statistic RV Percent Paced: 0 %
Date Time Interrogation Session: 20251021112607
HighPow Impedance: 40 Ohm
HighPow Impedance: 50 Ohm
Implantable Lead Connection Status: 753985
Implantable Lead Implant Date: 20081215
Implantable Lead Location: 753860
Implantable Lead Model: 6947
Implantable Pulse Generator Implant Date: 20190418
Lead Channel Impedance Value: 323 Ohm
Lead Channel Impedance Value: 399 Ohm
Lead Channel Pacing Threshold Amplitude: 0.875 V
Lead Channel Pacing Threshold Pulse Width: 0.4 ms
Lead Channel Sensing Intrinsic Amplitude: 14.5 mV
Lead Channel Sensing Intrinsic Amplitude: 14.5 mV
Lead Channel Setting Pacing Amplitude: 2.5 V
Lead Channel Setting Pacing Pulse Width: 0.4 ms
Lead Channel Setting Sensing Sensitivity: 0.3 mV
Zone Setting Status: 755011
Zone Setting Status: 755011

## 2024-05-03 NOTE — Progress Notes (Signed)
 Remote ICD Transmission

## 2024-05-06 ENCOUNTER — Ambulatory Visit: Payer: Self-pay | Admitting: Cardiology

## 2024-05-08 ENCOUNTER — Other Ambulatory Visit: Payer: Self-pay | Admitting: Internal Medicine

## 2024-05-09 ENCOUNTER — Other Ambulatory Visit: Payer: Self-pay

## 2024-05-31 ENCOUNTER — Other Ambulatory Visit: Payer: Self-pay | Admitting: Internal Medicine

## 2024-06-12 ENCOUNTER — Ambulatory Visit: Admitting: Internal Medicine

## 2024-06-12 ENCOUNTER — Encounter: Payer: Self-pay | Admitting: Internal Medicine

## 2024-06-12 VITALS — BP 136/80 | HR 85 | Temp 97.7°F | Ht 67.0 in | Wt 218.0 lb

## 2024-06-12 DIAGNOSIS — E538 Deficiency of other specified B group vitamins: Secondary | ICD-10-CM | POA: Diagnosis not present

## 2024-06-12 DIAGNOSIS — H6692 Otitis media, unspecified, left ear: Secondary | ICD-10-CM | POA: Insufficient documentation

## 2024-06-12 DIAGNOSIS — N183 Chronic kidney disease, stage 3 unspecified: Secondary | ICD-10-CM

## 2024-06-12 DIAGNOSIS — E7800 Pure hypercholesterolemia, unspecified: Secondary | ICD-10-CM

## 2024-06-12 DIAGNOSIS — R739 Hyperglycemia, unspecified: Secondary | ICD-10-CM | POA: Diagnosis not present

## 2024-06-12 DIAGNOSIS — N1831 Chronic kidney disease, stage 3a: Secondary | ICD-10-CM

## 2024-06-12 DIAGNOSIS — E559 Vitamin D deficiency, unspecified: Secondary | ICD-10-CM

## 2024-06-12 DIAGNOSIS — I10 Essential (primary) hypertension: Secondary | ICD-10-CM

## 2024-06-12 MED ORDER — EZETIMIBE 10 MG PO TABS: 10.0000 mg | ORAL_TABLET | Freq: Every day | ORAL | 3 refills | Status: AC

## 2024-06-12 MED ORDER — AZITHROMYCIN 250 MG PO TABS: ORAL_TABLET | ORAL | 1 refills | Status: AC

## 2024-06-12 NOTE — Assessment & Plan Note (Signed)
 Lab Results  Component Value Date   LDLCALC 129 (H) 01/15/2024   Uncontrolled, declines statin, so pt to start zetia 10 mg qd

## 2024-06-12 NOTE — Assessment & Plan Note (Signed)
 Lab Results  Component Value Date   VITAMINB12 1,025 (H) 01/15/2024   Stable, cont oral replacement - b12 1000 mcg qd

## 2024-06-12 NOTE — Assessment & Plan Note (Signed)
 BP Readings from Last 3 Encounters:  06/12/24 136/80  03/01/24 126/72  02/22/24 120/70   Stable, pt to continue medical treatment coreg  25 bid,

## 2024-06-12 NOTE — Patient Instructions (Addendum)
 Please take all new medication as prescribed - the antibiotic, and zetia 10 mg per day for cholesterol  Ok to increase the Vit D3 to 4000 u per day  Please continue all other medications as before, and refills have been done if requested.  Please have the pharmacy call with any other refills you may need.  Please continue your efforts at being more active, low cholesterol diet, and weight control.  Please keep your appointments with your specialists as you may have planned - Dr Pietro Cardiology soon  We can hold on lab testing today  Please make an Appointment to return in 6 months, or sooner if needed

## 2024-06-12 NOTE — Assessment & Plan Note (Signed)
 Lab Results  Component Value Date   CREATININE 1.53 (H) 01/15/2024   Stable overall, cont to avoid nephrotoxins

## 2024-06-12 NOTE — Progress Notes (Signed)
 Patient ID: [name] [name], female   DOB: 12-26-1940, 83 y.o.   MRN: [id number]        Chief Complaint: follow up HTN, HLD and hyperglycemia,, ckd3a, low vit d and b12, left otitis media       HPI:  [name] [name] is a 83 y.o. female here overall doing ok, Pt denies chest pain, increased sob or doe, wheezing, orthopnea, PND, increased LE swelling, palpitations, dizziness or syncope.   Pt denies polydipsia, polyuria, or new focal neuro s/s.    Pt denies wt loss, night sweats, loss of appetite, or other constitutional symptoms  Pt is taking vit d3 2000 u every day.  Not taking crestor  - declines statin but would take zetia.   Has bilateral knee arthritis with mild intermittent pain, usually better with tylenol  without giveaways or falls.  Has declines to see ortho recently.  Walks 1 mile daily.  Also has mod left ear pain and pressure with feverish x 3 days, with hearing somewhat reduced, but no ear d/c,chills, HA, sinus pain or ST or cough.        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   OVERACTIVE BLADDER 08/03/2010   PYELONEPHRITIS, HX OF 05/07/2007   SYSTOLIC HEART FAILURE, CHRONIC 12/28/2008   VITAMIN D  DEFICIENCY 06/17/2008   Past Surgical History:  Procedure Laterality Date   ABDOMINAL  HYSTERECTOMY     BREAST LUMPECTOMY WITH RADIOACTIVE SEED AND SENTINEL LYMPH NODE BIOPSY Right 10/11/2018   Procedure: RIGHT BREAST LUMPECTOMY WITH RADIOACTIVE SEED AND SENTINEL LYMPH NODE BIOPSY;  Surgeon: Aron Shoulders, MD;  Location: [facility] OR;  Service: General;  Laterality: Right;   ICD GENERATOR CHANGEOUT N/A 10/25/2017   Procedure: ICD GENERATOR CHANGEOUT;  Surgeon: Fernande Elspeth BROCKS, MD;  Location: [facility] INVASIVE CV LAB;  Service: Cardiovascular;  Laterality: N/A;   LAMINECTOMY     PORT-A-CATH REMOVAL N/A 05/08/2019   Procedure: REMOVAL OF PORT-A-CATH RIGHT CHEST;  Surgeon: Aron Shoulders, MD;  Location: [facility] OR;  Service: General;  Laterality: N/A;   PORTACATH PLACEMENT N/A 10/11/2018   Procedure: INSERTION PORT-A-CATH;  Surgeon: Aron Shoulders, MD;  Location: [facility] OR;  Service: General;  Laterality: N/A;   RE-EXCISION OF BREAST LUMPECTOMY Right 10/23/2018   Procedure: RE-EXCISION OF RIGHT BREAST LUMPECTOMY;  Surgeon: Aron Shoulders, MD;  Location: [facility] OR;  Service: General;  Laterality: Right;    reports that she has never smoked. She has never used smokeless tobacco. She reports that she does not drink alcohol and does not use drugs. family history includes Arthritis in an other family member; Coronary artery disease in an other family member; Heart disease in her mother; Kidney disease in an other family member. Allergies  Allergen Reactions   Crestor  [Rosuvastatin  Calcium ] Rash   Current  Outpatient Medications on File Prior to Visit  Medication Sig Dispense Refill   albuterol  (PROVENTIL  HFA;VENTOLIN  HFA) 108 (90 Base) MCG/ACT inhaler Inhale 1 puff into the lungs every 6 (six) hours as needed for wheezing or shortness of breath.     betamethasone  dipropionate 0.05 % cream Apply topically 2 (two) times daily. 30 g 0   carvedilol  (COREG ) 25 MG tablet TAKE ONE-HALF TABLET BY MOUTH  TWICE DAILY 100 tablet 3   cholecalciferol (VITAMIN D3) 25 MCG (1000 UT) tablet Take 1,000 Units by mouth daily.      dapagliflozin  propanediol (FARXIGA ) 10 MG TABS tablet Take 1 tablet (10 mg total) by mouth daily before breakfast. 90 tablet 2   furosemide  (LASIX ) 40 MG tablet TAKE 1 TABLET BY MOUTH TWICE  DAILY 180 tablet 3   lidocaine  (LIDODERM ) 5 % Place 1 patch onto the skin.     lidocaine -prilocaine  (EMLA ) cream APPLY TO AFFECTED AREA ONCE AS DIRECTED 30 g 3   meloxicam  (MOBIC ) 15 MG tablet TAKE 1 TABLET BY MOUTH EVERY DAY AS NEEDED FOR PAIN 90 tablet 3   Multiple Vitamin (MULTIVITAMIN WITH MINERALS) TABS tablet Take 1 tablet by mouth daily.     nitroGLYCERIN  (NITROLINGUAL ) 0.4 MG/SPRAY spray Place 1 spray under the tongue every 5 (five) minutes x 3 doses as needed for chest pain. 12 g 3   rizatriptan  (MAXALT ) 10 MG tablet Take 10 mg by mouth as needed for migraine. May repeat in 2 hours if needed     rosuvastatin  (CRESTOR ) 20 MG tablet Take 1 tablet (20 mg total) by mouth daily. 90 tablet 3   sacubitril -valsartan  (ENTRESTO ) 24-26 MG TAKE 1 TABLET BY MOUTH TWICE A DAY 180 tablet 3   scopolamine  (TRANSDERM-SCOP, 1.5 MG,) 1 MG/3DAYS Place 1 patch (1.5 mg total) onto the skin every 3 (three) days. 10 patch 0   spironolactone  (ALDACTONE ) 25 MG tablet TAKE 1/2 TABLET BY MOUTH EVERY DAY 50 tablet 3   Tiotropium Bromide-Olodaterol (STIOLTO RESPIMAT ) 2.5-2.5 MCG/ACT AERS Inhale 2 puffs into the lungs daily. 4 g 12   tiZANidine  (ZANAFLEX ) 2 MG tablet TAKE 2 TABLETS BY MOUTH EVERY 6 HOURS AS NEEDED FOR MUSCLE SPASM 60 tablet 2   traMADol  (ULTRAM ) 50 MG tablet TAKE 1 TABLET BY MOUTH EVERY 6 HOURS AS NEEDED. 30 tablet 2   No current facility-administered medications on file prior to visit.        ROS:  All others reviewed and negative.  Objective        PE:  BP 136/80 (BP Location: Right Arm, Patient Position: Sitting, Cuff Size: Normal)   Pulse 85   Temp 97.7 F (36.5 C) (Oral)   Ht 5' 7 (1.702 m)   Wt 218 lb (98.9 kg)   SpO2 98%   BMI 34.14 kg/m                 Constitutional: Pt appears in NAD                HENT: Head: NCAT.                Right Ear: External ear normal.                 Left Ear: External ear normal. Left TM severe erythema and mild effusion bulging               Eyes: . Pupils are equal, round, and reactive to light. Conjunctivae and EOM are normal  Nose: without d/c or deformity               Neck: Neck supple. Gross normal ROM               Cardiovascular: Normal rate and regular rhythm.                 Pulmonary/Chest: Effort normal and breath sounds without rales or wheezing.                Abd:  Soft, NT, ND, + BS, no organomegaly               Neurological: Pt is alert. At baseline orientation, motor grossly intact               Skin: Skin is warm. No rashes, no other new lesions, LE edema - none               Psychiatric: Pt behavior is normal without agitation   Micro: none  Cardiac tracings I have personally interpreted today:  none  Pertinent Radiological findings (summarize): none   Lab Results  Component Value Date   WBC 5.9 01/15/2024   HGB 11.1 (L) 01/15/2024   HCT 34.4 (L) 01/15/2024   PLT 191.0 01/15/2024   GLUCOSE 107 (H) 01/15/2024   CHOL 197 01/15/2024   TRIG 109.0 01/15/2024   HDL 46.50 01/15/2024   LDLDIRECT 129.7 02/07/2013   LDLCALC 129 (H) 01/15/2024   ALT 7 01/15/2024   AST 11 01/15/2024   NA 141 01/15/2024   K 4.7 01/15/2024   CL 109 01/15/2024   CREATININE 1.53 (H) 01/15/2024   BUN 14 01/15/2024   CO2 25 01/15/2024   TSH 1.90 01/15/2024   INR 0.9 RATIO 06/18/2007   HGBA1C 6.2 01/15/2024   Assessment/Plan:  TYREONNA CZAPLICKI is a 83 y.o. Black or African American [2] female with  has a past medical history of AICD (automatic cardioverter/defibrillator) present, ALLERGIC RHINITIS (11/06/2007), ANEMIA-IRON DEFICIENCY (05/23/2007), ANEMIA-NOS (06/17/2008), ANXIETY (02/22/2008), CARDIOMYOPATHY, PRIMARY, DILATED (12/30/2008), DEGENERATIVE DISC DISEASE, LUMBAR SPINE (05/07/2007), GERD (05/07/2007), History of kidney stones,  HYPERLIPIDEMIA (05/23/2007), HYPERTENSION (05/07/2007), KELOID SCAR, HX OF (02/02/2009), MENOPAUSAL DISORDER (05/06/2008), OSTEOARTHRITIS, KNEES, BILATERAL (05/23/2007), OVERACTIVE BLADDER (08/03/2010), PYELONEPHRITIS, HX OF (05/07/2007), SYSTOLIC HEART FAILURE, CHRONIC (12/28/2008), and VITAMIN D  DEFICIENCY (06/17/2008).  Vitamin D  deficiency Last vitamin D  Lab Results  Component Value Date   VD25OH 21.63 (L) 01/15/2024   Low, to increase oral replacement Vit D3 to 4000 u qd  Hyperlipidemia Lab Results  Component Value Date   LDLCALC 129 (H) 01/15/2024   Uncontrolled, declines statin, so pt to start zetia 10 mg qd   Hyperglycemia Lab Results  Component Value Date   HGBA1C 6.2 01/15/2024   Stable, pt to continue current medical treatment farxiga  10 qd   Essential hypertension BP Readings from Last 3 Encounters:  06/12/24 136/80  03/01/24 126/72  02/22/24 120/70   Stable, pt to continue medical treatment coreg  25 bid,    CKD (chronic kidney disease) stage 3, GFR 30-59 ml/min (HCC) Lab Results  Component Value Date   CREATININE 1.53 (H) 01/15/2024   Stable overall, cont to avoid nephrotoxins   B12 deficiency Lab Results  Component Value Date   VITAMINB12 1,025 (H) 01/15/2024   Stable, cont oral replacement - b12 1000 mcg qd   Left otitis media Mild to mod, for antibx course zpack,  to f/u any worsening symptoms or concerns  Followup: Return in about 6 months (around 12/11/2024).  Lynwood Rush, MD 06/12/2024 1:10 PM [facility]

## 2024-06-12 NOTE — Assessment & Plan Note (Signed)
 Lab Results  Component Value Date   HGBA1C 6.2 01/15/2024   Stable, pt to continue current medical treatment farxiga  10 qd

## 2024-06-12 NOTE — Assessment & Plan Note (Signed)
Mild to mod, for antibx course zpack,  to f/u any worsening symptoms or concerns 

## 2024-06-12 NOTE — Assessment & Plan Note (Signed)
 Last vitamin D  Lab Results  Component Value Date   VD25OH 21.63 (L) 01/15/2024   Low, to increase oral replacement Vit D3 to 4000 u qd

## 2024-06-25 ENCOUNTER — Other Ambulatory Visit: Payer: Self-pay | Admitting: Internal Medicine

## 2024-07-30 ENCOUNTER — Ambulatory Visit

## 2024-07-30 DIAGNOSIS — I428 Other cardiomyopathies: Secondary | ICD-10-CM | POA: Diagnosis not present

## 2024-08-01 ENCOUNTER — Ambulatory Visit: Payer: Self-pay | Admitting: Cardiology

## 2024-08-01 LAB — CUP PACEART REMOTE DEVICE CHECK
Battery Remaining Longevity: 44 mo
Battery Voltage: 2.97 V
Brady Statistic RV Percent Paced: 0.01 %
Date Time Interrogation Session: 20260120192306
HighPow Impedance: 40 Ohm
HighPow Impedance: 54 Ohm
Implantable Lead Connection Status: 753985
Implantable Lead Implant Date: 20081215
Implantable Lead Location: 753860
Implantable Lead Model: 6947
Implantable Pulse Generator Implant Date: 20190418
Lead Channel Impedance Value: 342 Ohm
Lead Channel Impedance Value: 437 Ohm
Lead Channel Pacing Threshold Amplitude: 0.875 V
Lead Channel Pacing Threshold Pulse Width: 0.4 ms
Lead Channel Sensing Intrinsic Amplitude: 17.75 mV
Lead Channel Sensing Intrinsic Amplitude: 17.75 mV
Lead Channel Setting Pacing Amplitude: 2.5 V
Lead Channel Setting Pacing Pulse Width: 0.4 ms
Lead Channel Setting Sensing Sensitivity: 0.3 mV
Zone Setting Status: 755011
Zone Setting Status: 755011

## 2024-08-02 NOTE — Progress Notes (Signed)
 Remote ICD Transmission

## 2024-08-10 ENCOUNTER — Other Ambulatory Visit: Payer: Self-pay | Admitting: Internal Medicine

## 2024-09-11 ENCOUNTER — Ambulatory Visit: Admitting: Pulmonary Disease

## 2024-09-12 ENCOUNTER — Ambulatory Visit: Admitting: Pulmonary Disease

## 2024-10-29 ENCOUNTER — Ambulatory Visit

## 2025-01-28 ENCOUNTER — Ambulatory Visit

## 2025-04-04 ENCOUNTER — Ambulatory Visit

## 2025-04-29 ENCOUNTER — Ambulatory Visit

## 2025-07-29 ENCOUNTER — Ambulatory Visit
